# Patient Record
Sex: Male | Born: 1950 | Race: White | Hispanic: No | Smoking: Never smoker
Health system: Southern US, Community
[De-identification: ages and names within clinical notes are randomized; demographics above are authoritative.]

## PROBLEM LIST (undated history)

## (undated) DIAGNOSIS — N200 Calculus of kidney: Secondary | ICD-10-CM

## (undated) DIAGNOSIS — Z87442 Personal history of urinary calculi: Secondary | ICD-10-CM

## (undated) DIAGNOSIS — C229 Malignant neoplasm of liver, not specified as primary or secondary: Secondary | ICD-10-CM

## (undated) DIAGNOSIS — I5042 Chronic combined systolic (congestive) and diastolic (congestive) heart failure: Secondary | ICD-10-CM

## (undated) DIAGNOSIS — M1990 Unspecified osteoarthritis, unspecified site: Secondary | ICD-10-CM

## (undated) DIAGNOSIS — I219 Acute myocardial infarction, unspecified: Secondary | ICD-10-CM

## (undated) DIAGNOSIS — F250 Schizoaffective disorder, bipolar type: Secondary | ICD-10-CM

## (undated) DIAGNOSIS — C801 Malignant (primary) neoplasm, unspecified: Secondary | ICD-10-CM

## (undated) DIAGNOSIS — R011 Cardiac murmur, unspecified: Secondary | ICD-10-CM

## (undated) DIAGNOSIS — C719 Malignant neoplasm of brain, unspecified: Secondary | ICD-10-CM

## (undated) DIAGNOSIS — I2510 Atherosclerotic heart disease of native coronary artery without angina pectoris: Secondary | ICD-10-CM

## (undated) DIAGNOSIS — I10 Essential (primary) hypertension: Secondary | ICD-10-CM

## (undated) DIAGNOSIS — L03119 Cellulitis of unspecified part of limb: Secondary | ICD-10-CM

## (undated) DIAGNOSIS — E119 Type 2 diabetes mellitus without complications: Secondary | ICD-10-CM

## (undated) DIAGNOSIS — L02619 Cutaneous abscess of unspecified foot: Secondary | ICD-10-CM

## (undated) DIAGNOSIS — E7800 Pure hypercholesterolemia, unspecified: Secondary | ICD-10-CM

## (undated) DIAGNOSIS — K219 Gastro-esophageal reflux disease without esophagitis: Secondary | ICD-10-CM

## (undated) DIAGNOSIS — F319 Bipolar disorder, unspecified: Secondary | ICD-10-CM

## (undated) DIAGNOSIS — G4733 Obstructive sleep apnea (adult) (pediatric): Secondary | ICD-10-CM

## (undated) HISTORY — PX: HIP SURGERY: SHX245

## (undated) HISTORY — PX: APPENDECTOMY: SHX54

## (undated) HISTORY — PX: KNEE SURGERY: SHX244

---

## 1993-02-16 DIAGNOSIS — R011 Cardiac murmur, unspecified: Secondary | ICD-10-CM

## 1993-02-16 HISTORY — DX: Cardiac murmur, unspecified: R01.1

## 2001-04-29 ENCOUNTER — Encounter: Payer: Self-pay | Admitting: Emergency Medicine

## 2001-04-29 ENCOUNTER — Inpatient Hospital Stay (HOSPITAL_COMMUNITY): Admission: EM | Admit: 2001-04-29 | Discharge: 2001-05-06 | Payer: Self-pay | Admitting: Nephrology

## 2001-04-29 ENCOUNTER — Emergency Department (HOSPITAL_COMMUNITY): Admission: EM | Admit: 2001-04-29 | Discharge: 2001-04-29 | Payer: Self-pay | Admitting: Emergency Medicine

## 2001-04-29 ENCOUNTER — Encounter: Payer: Self-pay | Admitting: Nephrology

## 2001-05-01 ENCOUNTER — Encounter: Payer: Self-pay | Admitting: Urology

## 2001-05-02 ENCOUNTER — Encounter: Payer: Self-pay | Admitting: Urology

## 2001-05-03 ENCOUNTER — Encounter: Payer: Self-pay | Admitting: Nephrology

## 2001-05-03 ENCOUNTER — Encounter: Payer: Self-pay | Admitting: Urology

## 2001-05-04 ENCOUNTER — Encounter: Payer: Self-pay | Admitting: Nephrology

## 2001-05-12 ENCOUNTER — Encounter: Payer: Self-pay | Admitting: Urology

## 2001-05-12 ENCOUNTER — Ambulatory Visit (HOSPITAL_BASED_OUTPATIENT_CLINIC_OR_DEPARTMENT_OTHER): Admission: RE | Admit: 2001-05-12 | Discharge: 2001-05-12 | Payer: Self-pay | Admitting: Urology

## 2001-05-19 ENCOUNTER — Ambulatory Visit (HOSPITAL_BASED_OUTPATIENT_CLINIC_OR_DEPARTMENT_OTHER): Admission: RE | Admit: 2001-05-19 | Discharge: 2001-05-19 | Payer: Self-pay | Admitting: Urology

## 2001-05-19 ENCOUNTER — Encounter: Payer: Self-pay | Admitting: Urology

## 2001-07-14 ENCOUNTER — Ambulatory Visit (HOSPITAL_BASED_OUTPATIENT_CLINIC_OR_DEPARTMENT_OTHER): Admission: RE | Admit: 2001-07-14 | Discharge: 2001-07-14 | Payer: Self-pay | Admitting: Urology

## 2001-09-22 ENCOUNTER — Emergency Department (HOSPITAL_COMMUNITY): Admission: EM | Admit: 2001-09-22 | Discharge: 2001-09-22 | Payer: Self-pay | Admitting: Emergency Medicine

## 2001-09-23 ENCOUNTER — Encounter: Payer: Self-pay | Admitting: Nephrology

## 2001-09-23 ENCOUNTER — Ambulatory Visit (HOSPITAL_COMMUNITY): Admission: RE | Admit: 2001-09-23 | Discharge: 2001-09-23 | Payer: Self-pay | Admitting: Nephrology

## 2003-08-29 ENCOUNTER — Ambulatory Visit (HOSPITAL_COMMUNITY): Admission: RE | Admit: 2003-08-29 | Discharge: 2003-08-29 | Payer: Self-pay | Admitting: Ophthalmology

## 2010-08-19 ENCOUNTER — Other Ambulatory Visit (HOSPITAL_COMMUNITY): Payer: Self-pay | Admitting: Pulmonary Disease

## 2010-08-19 ENCOUNTER — Ambulatory Visit (HOSPITAL_COMMUNITY)
Admission: RE | Admit: 2010-08-19 | Discharge: 2010-08-19 | Disposition: A | Discharge status: Home / Self Care | Payer: Medicare Other | Source: Ambulatory Visit | Attending: Pulmonary Disease | Admitting: Pulmonary Disease

## 2010-08-19 DIAGNOSIS — I10 Essential (primary) hypertension: Secondary | ICD-10-CM | POA: Insufficient documentation

## 2010-08-19 DIAGNOSIS — E119 Type 2 diabetes mellitus without complications: Secondary | ICD-10-CM | POA: Insufficient documentation

## 2010-08-19 DIAGNOSIS — J40 Bronchitis, not specified as acute or chronic: Secondary | ICD-10-CM

## 2010-08-19 DIAGNOSIS — R059 Cough, unspecified: Secondary | ICD-10-CM | POA: Insufficient documentation

## 2010-08-19 DIAGNOSIS — R05 Cough: Secondary | ICD-10-CM | POA: Insufficient documentation

## 2010-11-05 ENCOUNTER — Ambulatory Visit (HOSPITAL_COMMUNITY)
Admission: RE | Admit: 2010-11-05 | Discharge: 2010-11-05 | Disposition: A | Discharge status: Home / Self Care | Payer: Medicare Other | Source: Ambulatory Visit | Attending: Urology | Admitting: Urology

## 2010-11-05 ENCOUNTER — Other Ambulatory Visit: Payer: Self-pay | Admitting: Urology

## 2010-11-05 DIAGNOSIS — N200 Calculus of kidney: Secondary | ICD-10-CM | POA: Insufficient documentation

## 2010-11-05 DIAGNOSIS — Q619 Cystic kidney disease, unspecified: Secondary | ICD-10-CM | POA: Insufficient documentation

## 2010-11-05 DIAGNOSIS — R109 Unspecified abdominal pain: Secondary | ICD-10-CM | POA: Insufficient documentation

## 2010-11-05 LAB — CREATININE, SERUM
Creatinine, Ser: 1.03 mg/dL (ref 0.50–1.35)
GFR calc Af Amer: >60 mL/min (ref >60)
GFR calc non Af Amer: >60 mL/min (ref >60)

## 2010-11-05 LAB — BUN: BUN: 18 mg/dL (ref 6–23)

## 2010-12-05 ENCOUNTER — Ambulatory Visit (INDEPENDENT_AMBULATORY_CARE_PROVIDER_SITE_OTHER): Payer: Medicare Other | Admitting: Urology

## 2010-12-05 DIAGNOSIS — N200 Calculus of kidney: Secondary | ICD-10-CM

## 2010-12-05 DIAGNOSIS — N281 Cyst of kidney, acquired: Secondary | ICD-10-CM

## 2010-12-05 DIAGNOSIS — N400 Enlarged prostate without lower urinary tract symptoms: Secondary | ICD-10-CM

## 2011-05-07 ENCOUNTER — Ambulatory Visit (INDEPENDENT_AMBULATORY_CARE_PROVIDER_SITE_OTHER): Payer: Medicare Other | Admitting: Otolaryngology

## 2011-05-07 DIAGNOSIS — H6120 Impacted cerumen, unspecified ear: Secondary | ICD-10-CM

## 2011-05-07 DIAGNOSIS — H903 Sensorineural hearing loss, bilateral: Secondary | ICD-10-CM

## 2011-11-05 ENCOUNTER — Ambulatory Visit (INDEPENDENT_AMBULATORY_CARE_PROVIDER_SITE_OTHER): Payer: Medicare Other | Admitting: Otolaryngology

## 2011-11-05 DIAGNOSIS — H903 Sensorineural hearing loss, bilateral: Secondary | ICD-10-CM

## 2011-11-27 ENCOUNTER — Ambulatory Visit (INDEPENDENT_AMBULATORY_CARE_PROVIDER_SITE_OTHER): Payer: Medicare Other | Admitting: Urology

## 2011-11-27 DIAGNOSIS — N400 Enlarged prostate without lower urinary tract symptoms: Secondary | ICD-10-CM

## 2011-11-27 DIAGNOSIS — N200 Calculus of kidney: Secondary | ICD-10-CM

## 2012-09-23 ENCOUNTER — Other Ambulatory Visit: Payer: Self-pay | Admitting: Urology

## 2012-09-23 DIAGNOSIS — N200 Calculus of kidney: Secondary | ICD-10-CM

## 2012-11-03 ENCOUNTER — Ambulatory Visit (INDEPENDENT_AMBULATORY_CARE_PROVIDER_SITE_OTHER): Payer: Medicare Other | Admitting: Otolaryngology

## 2012-12-02 ENCOUNTER — Ambulatory Visit (HOSPITAL_COMMUNITY)
Admission: RE | Admit: 2012-12-02 | Discharge: 2012-12-02 | Disposition: A | Discharge status: Home / Self Care | Payer: Medicare Other | Source: Ambulatory Visit | Attending: Urology | Admitting: Urology

## 2012-12-02 DIAGNOSIS — N200 Calculus of kidney: Secondary | ICD-10-CM | POA: Insufficient documentation

## 2012-12-02 DIAGNOSIS — I10 Essential (primary) hypertension: Secondary | ICD-10-CM | POA: Insufficient documentation

## 2012-12-02 DIAGNOSIS — N400 Enlarged prostate without lower urinary tract symptoms: Secondary | ICD-10-CM | POA: Insufficient documentation

## 2012-12-09 ENCOUNTER — Ambulatory Visit (INDEPENDENT_AMBULATORY_CARE_PROVIDER_SITE_OTHER): Payer: Medicare Other | Admitting: Urology

## 2012-12-09 ENCOUNTER — Encounter (INDEPENDENT_AMBULATORY_CARE_PROVIDER_SITE_OTHER): Payer: Self-pay

## 2012-12-09 DIAGNOSIS — N200 Calculus of kidney: Secondary | ICD-10-CM

## 2012-12-09 DIAGNOSIS — N401 Enlarged prostate with lower urinary tract symptoms: Secondary | ICD-10-CM

## 2012-12-27 ENCOUNTER — Other Ambulatory Visit (HOSPITAL_COMMUNITY): Payer: Self-pay | Admitting: Pulmonary Disease

## 2012-12-27 ENCOUNTER — Ambulatory Visit (HOSPITAL_COMMUNITY)
Admission: RE | Admit: 2012-12-27 | Discharge: 2012-12-27 | Disposition: A | Discharge status: Home / Self Care | Payer: Medicare Other | Source: Ambulatory Visit | Attending: Pulmonary Disease | Admitting: Pulmonary Disease

## 2012-12-27 DIAGNOSIS — M5137 Other intervertebral disc degeneration, lumbosacral region: Secondary | ICD-10-CM | POA: Insufficient documentation

## 2012-12-27 DIAGNOSIS — M549 Dorsalgia, unspecified: Secondary | ICD-10-CM

## 2012-12-27 DIAGNOSIS — M51379 Other intervertebral disc degeneration, lumbosacral region without mention of lumbar back pain or lower extremity pain: Secondary | ICD-10-CM | POA: Insufficient documentation

## 2012-12-27 DIAGNOSIS — M47817 Spondylosis without myelopathy or radiculopathy, lumbosacral region: Secondary | ICD-10-CM | POA: Insufficient documentation

## 2012-12-27 DIAGNOSIS — Q762 Congenital spondylolisthesis: Secondary | ICD-10-CM | POA: Insufficient documentation

## 2012-12-27 DIAGNOSIS — M545 Low back pain, unspecified: Secondary | ICD-10-CM | POA: Insufficient documentation

## 2013-09-27 ENCOUNTER — Emergency Department (HOSPITAL_COMMUNITY)
Admission: EM | Admit: 2013-09-27 | Discharge: 2013-09-27 | Disposition: A | Discharge status: Home / Self Care | Payer: Medicare Other | Attending: Emergency Medicine | Admitting: Emergency Medicine

## 2013-09-27 ENCOUNTER — Encounter (HOSPITAL_COMMUNITY): Payer: Self-pay | Admitting: Emergency Medicine

## 2013-09-27 DIAGNOSIS — R1011 Right upper quadrant pain: Secondary | ICD-10-CM | POA: Insufficient documentation

## 2013-09-27 DIAGNOSIS — Z8739 Personal history of other diseases of the musculoskeletal system and connective tissue: Secondary | ICD-10-CM | POA: Insufficient documentation

## 2013-09-27 DIAGNOSIS — Z87442 Personal history of urinary calculi: Secondary | ICD-10-CM | POA: Insufficient documentation

## 2013-09-27 DIAGNOSIS — Z8639 Personal history of other endocrine, nutritional and metabolic disease: Secondary | ICD-10-CM | POA: Insufficient documentation

## 2013-09-27 DIAGNOSIS — Z79899 Other long term (current) drug therapy: Secondary | ICD-10-CM | POA: Diagnosis not present

## 2013-09-27 DIAGNOSIS — R110 Nausea: Secondary | ICD-10-CM | POA: Diagnosis not present

## 2013-09-27 DIAGNOSIS — I10 Essential (primary) hypertension: Secondary | ICD-10-CM | POA: Diagnosis not present

## 2013-09-27 DIAGNOSIS — R011 Cardiac murmur, unspecified: Secondary | ICD-10-CM | POA: Diagnosis not present

## 2013-09-27 DIAGNOSIS — R109 Unspecified abdominal pain: Secondary | ICD-10-CM | POA: Insufficient documentation

## 2013-09-27 DIAGNOSIS — Z9089 Acquired absence of other organs: Secondary | ICD-10-CM | POA: Insufficient documentation

## 2013-09-27 DIAGNOSIS — Z862 Personal history of diseases of the blood and blood-forming organs and certain disorders involving the immune mechanism: Secondary | ICD-10-CM | POA: Diagnosis not present

## 2013-09-27 HISTORY — DX: Essential (primary) hypertension: I10

## 2013-09-27 HISTORY — DX: Type 2 diabetes mellitus without complications: E11.9

## 2013-09-27 HISTORY — DX: Calculus of kidney: N20.0

## 2013-09-27 HISTORY — DX: Unspecified osteoarthritis, unspecified site: M19.90

## 2013-09-27 LAB — CBC WITH DIFFERENTIAL/PLATELET
Basophils Absolute: 0 10*3/uL (ref 0.0–0.1)
Basophils Relative: 0 % (ref 0–1)
EOS ABS: 0 10*3/uL (ref 0.0–0.7)
Eosinophils Relative: 0 % (ref 0–5)
HCT: 48.4 % (ref 39.0–52.0)
HEMOGLOBIN: 17.4 g/dL — AB (ref 13.0–17.0)
Lymphocytes Relative: 9 % — ABNORMAL LOW (ref 12–46)
Lymphs Abs: 1.3 10*3/uL (ref 0.7–4.0)
MCH: 30.3 pg (ref 26.0–34.0)
MCHC: 36 g/dL (ref 30.0–36.0)
MCV: 84.3 fL (ref 78.0–100.0)
Monocytes Absolute: 0.7 10*3/uL (ref 0.1–1.0)
Monocytes Relative: 5 % (ref 3–12)
NEUTROS PCT: 86 % — AB (ref 43–77)
Neutro Abs: 11.9 10*3/uL — ABNORMAL HIGH (ref 1.7–7.7)
Platelets: 124 10*3/uL — ABNORMAL LOW (ref 150–400)
RBC: 5.74 MIL/uL (ref 4.22–5.81)
RDW: 14 % (ref 11.5–15.5)
WBC: 14 10*3/uL — ABNORMAL HIGH (ref 4.0–10.5)

## 2013-09-27 LAB — COMPREHENSIVE METABOLIC PANEL
ALT: 19 U/L (ref 0–53)
ANION GAP: 11 (ref 5–15)
AST: 18 U/L (ref 0–37)
Albumin: 3.7 g/dL (ref 3.5–5.2)
Alkaline Phosphatase: 126 U/L — ABNORMAL HIGH (ref 39–117)
BILIRUBIN TOTAL: 0.4 mg/dL (ref 0.3–1.2)
BUN: 20 mg/dL (ref 6–23)
CO2: 29 mEq/L (ref 19–32)
Calcium: 9.5 mg/dL (ref 8.4–10.5)
Chloride: 97 mEq/L (ref 96–112)
Creatinine, Ser: 1.34 mg/dL (ref 0.50–1.35)
GFR calc non Af Amer: 55 mL/min — ABNORMAL LOW (ref >90)
GFR, EST AFRICAN AMERICAN: 64 mL/min — AB (ref >90)
GLUCOSE: 229 mg/dL — AB (ref 70–99)
POTASSIUM: 3.9 meq/L (ref 3.7–5.3)
Sodium: 137 mEq/L (ref 137–147)
TOTAL PROTEIN: 7.1 g/dL (ref 6.0–8.3)

## 2013-09-27 LAB — URINALYSIS, ROUTINE W REFLEX MICROSCOPIC
BILIRUBIN URINE: NEGATIVE
GLUCOSE, UA: 500 mg/dL — AB
HGB URINE DIPSTICK: NEGATIVE
Ketones, ur: NEGATIVE mg/dL
Leukocytes, UA: NEGATIVE
NITRITE: NEGATIVE
Protein, ur: NEGATIVE mg/dL
SPECIFIC GRAVITY, URINE: 1.025 (ref 1.005–1.030)
Urobilinogen, UA: 0.2 mg/dL (ref 0.0–1.0)
pH: 5.5 (ref 5.0–8.0)

## 2013-09-27 LAB — LIPASE, BLOOD: Lipase: 21 U/L (ref 11–59)

## 2013-09-27 MED ORDER — SODIUM CHLORIDE 0.9 % IV SOLN: 1000.0000 mL | INTRAVENOUS | Status: DC

## 2013-09-27 MED ORDER — HYDROCODONE-ACETAMINOPHEN 5-325 MG PO TABS: 1.0000 | ORAL_TABLET | Freq: Four times a day (QID) | ORAL | Status: DC | PRN

## 2013-09-27 MED ORDER — FENTANYL CITRATE 0.05 MG/ML IJ SOLN
50.0000 ug | Freq: Once | INTRAMUSCULAR | Status: AC
  Administered 2013-09-27: 50 ug via INTRAVENOUS
  Filled 2013-09-27: qty 2

## 2013-09-27 MED ORDER — SODIUM CHLORIDE 0.9 % IV SOLN
1000.0000 mL | Freq: Once | INTRAVENOUS | Status: AC
  Administered 2013-09-27: 1000 mL via INTRAVENOUS

## 2013-09-27 MED ORDER — FENTANYL CITRATE 0.05 MG/ML IJ SOLN
25.0000 ug | Freq: Once | INTRAMUSCULAR | Status: AC
  Administered 2013-09-27: 25 ug via INTRAVENOUS
  Filled 2013-09-27: qty 2

## 2013-09-27 MED ORDER — ONDANSETRON HCL 4 MG/2ML IJ SOLN
4.0000 mg | Freq: Once | INTRAMUSCULAR | Status: AC
Start: 2013-09-27 — End: 2013-09-27
  Administered 2013-09-27: 4 mg via INTRAVENOUS
  Filled 2013-09-27: qty 2

## 2013-09-27 MED ORDER — ONDANSETRON HCL 4 MG PO TABS: 4.0000 mg | ORAL_TABLET | Freq: Three times a day (TID) | ORAL | Status: DC | PRN

## 2013-09-27 NOTE — ED Provider Notes (Signed)
CSN: [id number]     Arrival date & time 09/27/13  2038 History   This chart was scribed for Janice Norrie, MD by Lowella Petties, ED Scribe. The patient was seen in room APA16A/APA16A. Patient's care was started at 9:46 PM.    Chief Complaint  Patient presents with  . Abdominal Pain    The history is provided by the patient. No language interpreter was used.   HPI Comments: [name] is a 63 y.o. male who presents to the Emergency Department complaining of a constant, severe,non-radiating abdominal pain on the upper right side, onset earlier this afternoon. He reports a loose BM earlier tonight, and subjective fever. He reports eating a lot of fried ocra before the pain began.He denies cough, sore throat, rhinorrhea, and vomiting. He has had nausea. The pain does not radiate to his back He reports that there are no modifying factors that make the pain worse. He reports that curling up improves the pain. He states that he has had stomach aches in the past, but never this severe.  He denies being around someone sick. He reports a past history of appendectomy. He states that he is on disability for Bipolar Disorder. He reports that he does not smoke or drink.  PCP: Alonza Bogus, MD   Past Medical History  Diagnosis Date  . Diabetes mellitus without complication     boderline  . Hypertension   . Arthritis   . Kidney stones    Past Surgical History  Procedure Laterality Date  . Knee surgery    . Appendectomy    . Hip surgery     No family history on file. History  Substance Use Topics  . Smoking status: Never Smoker   . Smokeless tobacco: Not on file  . Alcohol Use: No  lives at home Lives with mother  Review of Systems A complete 10 system review of systems was obtained and all systems are negative except as noted in the HPI and PMH.   Allergies  Codeine and Morphine and related  Home Medications   Prior to Admission medications   Medication Sig Start Date End Date  Taking? Authorizing Provider  amLODipine (NORVASC) 5 MG tablet Take 5 mg by mouth daily. 08/30/13  Yes Historical Provider, MD  ARIPiprazole (ABILIFY) 15 MG tablet Take 7.5 mg by mouth daily. 09/13/13  Yes Historical Provider, MD  Calcium Carbonate-Vitamin D (CALTRATE 600+D PO) Take 1 tablet by mouth 2 (two) times daily.   Yes Historical Provider, MD  hydrochlorothiazide (HYDRODIURIL) 25 MG tablet Take 25 mg by mouth daily. 07/19/13  Yes Historical Provider, MD  lisinopril (PRINIVIL,ZESTRIL) 10 MG tablet Take 10 mg by mouth daily. 08/30/13  Yes Historical Provider, MD  omeprazole (PRILOSEC) 20 MG capsule Take 20 mg by mouth daily. 09/13/13  Yes Historical Provider, MD  potassium chloride SA (K-DUR,KLOR-CON) 20 MEQ tablet Take 20 mEq by mouth daily. 09/13/13  Yes Historical Provider, MD  vitamin C (ASCORBIC ACID) 500 MG tablet Take 500 mg by mouth daily.   Yes Historical Provider, MD   Triage Vitals: BP 120/73  Pulse 89  Temp(Src) 98.1 F (36.7 C) (Oral)  Resp 20  Ht 5\' 4"  (1.626 m)  Wt 185 lb (83.915 kg)  BMI 31.74 kg/m2  SpO2 99%  Vital signs normal   Physical Exam  Nursing note and vitals reviewed. Constitutional: He is oriented to person, place, and time. He appears well-developed and well-nourished.  Non-toxic appearance. He does not appear ill.  No distress.  Appears uncomfortable  HENT:  Head: Normocephalic and atraumatic.  Right Ear: External ear normal.  Left Ear: External ear normal.  Nose: Nose normal. No mucosal edema or rhinorrhea.  Mouth/Throat: Oropharynx is clear and moist and mucous membranes are normal. No dental abscesses or uvula swelling.  Eyes: Conjunctivae and EOM are normal. Pupils are equal, round, and reactive to light.  Neck: Normal range of motion and full passive range of motion without pain. Neck supple.  Cardiovascular: Normal rate and regular rhythm.  Exam reveals no gallop and no friction rub.   Murmur heard.  Crescendo systolic murmur is present  Murmur  heard best left upper sternal boarder.   Pulmonary/Chest: Effort normal and breath sounds normal. No respiratory distress. He has no wheezes. He has no rhonchi. He has no rales. He exhibits no tenderness and no crepitus.  Abdominal: Soft. Normal appearance and bowel sounds are normal. He exhibits no distension. There is tenderness (RUQ) in the right upper quadrant. There is no rebound and no guarding.    No CVA tenderness.  Musculoskeletal: Normal range of motion. He exhibits no edema and no tenderness.  Moves all extremities well.   Neurological: He is alert and oriented to person, place, and time. He has normal strength. No cranial nerve deficit.  Skin: Skin is warm, dry and intact. No rash noted. No erythema. No pallor.  Psychiatric: His speech is normal and behavior is normal. His mood appears not anxious.  Flat affect    ED Course  Procedures (including critical care time) Medications  0.9 %  sodium chloride infusion (0 mLs Intravenous Stopped 09/27/13 2328)    Followed by  0.9 %  sodium chloride infusion (not administered)  fentaNYL (SUBLIMAZE) injection 50 mcg (50 mcg Intravenous Given 09/27/13 2224)  ondansetron (ZOFRAN) injection 4 mg (4 mg Intravenous Given 09/27/13 2224)  fentaNYL (SUBLIMAZE) injection 25 mcg (25 mcg Intravenous Given 09/27/13 2325)    DIAGNOSTIC STUDIES: Oxygen Saturation is 99% on room air, normal by my interpretation.    COORDINATION OF CARE: 9:54 PM-Discussed treatment plan which includes pain medication, nausea medication, and blood work with pt at bedside and pt agreed to plan.   Pt and mother given test results, his pain is almost gone. Will have patient return to get outpatient [place] of gallbladder tomorrow.   Labs Review Results for orders placed during the hospital encounter of 09/27/13  URINALYSIS, ROUTINE W REFLEX MICROSCOPIC      Result Value Ref Range   Color, Urine YELLOW  YELLOW   APPearance CLEAR  CLEAR   Specific Gravity, Urine 1.025   1.005 - 1.030   pH 5.5  5.0 - 8.0   Glucose, UA 500 (*) NEGATIVE mg/dL   Hgb urine dipstick NEGATIVE  NEGATIVE   Bilirubin Urine NEGATIVE  NEGATIVE   Ketones, ur NEGATIVE  NEGATIVE mg/dL   Protein, ur NEGATIVE  NEGATIVE mg/dL   Urobilinogen, UA 0.2  0.0 - 1.0 mg/dL   Nitrite NEGATIVE  NEGATIVE   Leukocytes, UA NEGATIVE  NEGATIVE  CBC WITH DIFFERENTIAL      Result Value Ref Range   WBC 14.0 (*) 4.0 - 10.5 K/uL   RBC 5.74  4.22 - 5.81 MIL/uL   Hemoglobin 17.4 (*) 13.0 - 17.0 g/dL   HCT 48.4  39.0 - 52.0 %   MCV 84.3  78.0 - 100.0 fL   MCH 30.3  26.0 - 34.0 pg   MCHC 36.0  30.0 - 36.0 g/dL  RDW 14.0  11.5 - 15.5 %   Platelets 124 (*) 150 - 400 K/uL   Neutrophils Relative % 86 (*) 43 - 77 %   Neutro Abs 11.9 (*) 1.7 - 7.7 K/uL   Lymphocytes Relative 9 (*) 12 - 46 %   Lymphs Abs 1.3  0.7 - 4.0 K/uL   Monocytes Relative 5  3 - 12 %   Monocytes Absolute 0.7  0.1 - 1.0 K/uL   Eosinophils Relative 0  0 - 5 %   Eosinophils Absolute 0.0  0.0 - 0.7 K/uL   Basophils Relative 0  0 - 1 %   Basophils Absolute 0.0  0.0 - 0.1 K/uL  COMPREHENSIVE METABOLIC PANEL      Result Value Ref Range   Sodium 137  137 - 147 mEq/L   Potassium 3.9  3.7 - 5.3 mEq/L   Chloride 97  96 - 112 mEq/L   CO2 29  19 - 32 mEq/L   Glucose, Bld 229 (*) 70 - 99 mg/dL   BUN 20  6 - 23 mg/dL   Creatinine, Ser 1.34  0.50 - 1.35 mg/dL   Calcium 9.5  8.4 - 10.5 mg/dL   Total Protein 7.1  6.0 - 8.3 g/dL   Albumin 3.7  3.5 - 5.2 g/dL   AST 18  0 - 37 U/L   ALT 19  0 - 53 U/L   Alkaline Phosphatase 126 (*) 39 - 117 U/L   Total Bilirubin 0.4  0.3 - 1.2 mg/dL   GFR calc non Af Amer 55 (*) >90 mL/min   GFR calc Af Amer 64 (*) >90 mL/min   Anion gap 11  5 - 15  LIPASE, BLOOD      Result Value Ref Range   Lipase 21  11 - 59 U/L   Laboratory interpretation all normal      Imaging Review No results found.   EKG Interpretation None      MDM   Final diagnoses:  RUQ abdominal pain    Discharge Medication  List as of 09/27/2013 11:31 PM    START taking these medications   Details  HYDROcodone-acetaminophen (NORCO/VICODIN) 5-325 MG per tablet Take 1 tablet by mouth every 6 (six) hours as needed for moderate pain., Starting 09/27/2013, Until Discontinued, Print    ondansetron (ZOFRAN) 4 MG tablet Take 1 tablet (4 mg total) by mouth every 8 (eight) hours as needed for nausea or vomiting., Starting 09/27/2013, Until Discontinued, Print        Plan discharge  Rolland Porter, MD, FACEP   I personally performed the services described in this documentation, which was scribed in my presence. The recorded information has been reviewed and considered.  Rolland Porter, MD, FACEP   Janice Norrie, MD 09/28/13 [phone]

## 2013-09-27 NOTE — Discharge Instructions (Signed)
Avoid fried, spicy or greasy foods. The first available appointment tomorrow for the ultrasound to check you for gallstones is at 1 pm tomorrow, Thursday, the 13th. Arrive 15 mintues early. DO NOT EAT OR DRINK FOR 8 HOURS BEFORE YOUR TEST WHICH WOULD BE AFTER 7 AM). You can take your medications with a small amount of water. Take the hydrocodone if needed for pain and use the zofran for nausea or vomiting.   If you have gallstones, continue to avoid fried, spicy or greasy foods. Call Dr Arnoldo Morale office to discuss your gallstones. If you don't have gallstones take the prilosec OTC twice a day for 2 weeks then once a day and call Dr Gala Romney to have him recheck you to further evaluate your abdominal pain.

## 2013-09-27 NOTE — ED Notes (Signed)
Having severe stomach pain, tried to vomit but I would not. I had a bowel movement and it was a little loose per pt.

## 2013-09-28 ENCOUNTER — Other Ambulatory Visit (HOSPITAL_COMMUNITY): Payer: Self-pay | Admitting: Emergency Medicine

## 2013-09-28 ENCOUNTER — Ambulatory Visit (HOSPITAL_COMMUNITY)
Admit: 2013-09-28 | Discharge: 2013-09-28 | Disposition: A | Discharge status: Home / Self Care | Payer: Medicare Other | Source: Ambulatory Visit | Attending: Emergency Medicine | Admitting: Emergency Medicine

## 2013-09-28 DIAGNOSIS — K8020 Calculus of gallbladder without cholecystitis without obstruction: Secondary | ICD-10-CM | POA: Diagnosis not present

## 2013-09-28 DIAGNOSIS — R1011 Right upper quadrant pain: Secondary | ICD-10-CM

## 2013-09-28 MED ORDER — HYDROCODONE-ACETAMINOPHEN 5-325 MG PO TABS: 1.0000 | ORAL_TABLET | ORAL | Status: DC | PRN

## 2013-09-28 NOTE — ED Provider Notes (Signed)
[name] returned today for outpatient ultrasound after being evaluated yesterday for right upper quadrant abdominal pain. [name]'s blood work from yesterday was reviewed, he had essentially normal LFTs except for very slightly elevated alkaline phosphatase. The [name] is afebrile. He reports that his pain is still present, but much better than it was yesterday.  Ultrasound report was reviewed. She does have gallbladder wall edema with cholelithiasis. Case discussed with Doctor Aviva Signs, on call for general surgery. He does not recommend urgent or emergent surgery. [name] to be followed as an outpatient. He will see the [name] in the office on Tuesday. [name] will continue analgesia, bland diet. Return to the ER if he has any increased pain, vomiting, fever.  Results for orders placed during the hospital encounter of 09/27/13  URINALYSIS, ROUTINE W REFLEX MICROSCOPIC      Result Value Ref Range   Color, Urine YELLOW  YELLOW   APPearance CLEAR  CLEAR   Specific Gravity, Urine 1.025  1.005 - 1.030   pH 5.5  5.0 - 8.0   Glucose, UA 500 (*) NEGATIVE mg/dL   Hgb urine dipstick NEGATIVE  NEGATIVE   Bilirubin Urine NEGATIVE  NEGATIVE   Ketones, ur NEGATIVE  NEGATIVE mg/dL   Protein, ur NEGATIVE  NEGATIVE mg/dL   Urobilinogen, UA 0.2  0.0 - 1.0 mg/dL   Nitrite NEGATIVE  NEGATIVE   Leukocytes, UA NEGATIVE  NEGATIVE  CBC WITH DIFFERENTIAL      Result Value Ref Range   WBC 14.0 (*) 4.0 - 10.5 K/uL   RBC 5.74  4.22 - 5.81 MIL/uL   Hemoglobin 17.4 (*) 13.0 - 17.0 g/dL   HCT 48.4  39.0 - 52.0 %   MCV 84.3  78.0 - 100.0 fL   MCH 30.3  26.0 - 34.0 pg   MCHC 36.0  30.0 - 36.0 g/dL   RDW 14.0  11.5 - 15.5 %   Platelets 124 (*) 150 - 400 K/uL   Neutrophils Relative % 86 (*) 43 - 77 %   Neutro Abs 11.9 (*) 1.7 - 7.7 K/uL   Lymphocytes Relative 9 (*) 12 - 46 %   Lymphs Abs 1.3  0.7 - 4.0 K/uL   Monocytes Relative 5  3 - 12 %   Monocytes Absolute 0.7  0.1 - 1.0 K/uL   Eosinophils Relative 0  0  - 5 %   Eosinophils Absolute 0.0  0.0 - 0.7 K/uL   Basophils Relative 0  0 - 1 %   Basophils Absolute 0.0  0.0 - 0.1 K/uL  COMPREHENSIVE METABOLIC PANEL      Result Value Ref Range   Sodium 137  137 - 147 mEq/L   Potassium 3.9  3.7 - 5.3 mEq/L   Chloride 97  96 - 112 mEq/L   CO2 29  19 - 32 mEq/L   Glucose, Bld 229 (*) 70 - 99 mg/dL   BUN 20  6 - 23 mg/dL   Creatinine, Ser 1.34  0.50 - 1.35 mg/dL   Calcium 9.5  8.4 - 10.5 mg/dL   Total Protein 7.1  6.0 - 8.3 g/dL   Albumin 3.7  3.5 - 5.2 g/dL   AST 18  0 - 37 U/L   ALT 19  0 - 53 U/L   Alkaline Phosphatase 126 (*) 39 - 117 U/L   Total Bilirubin 0.4  0.3 - 1.2 mg/dL   GFR calc non Af Amer 55 (*) >90 mL/min   GFR calc Af Amer 64 (*) >90  mL/min   Anion gap 11  5 - 15  LIPASE, BLOOD      Result Value Ref Range   Lipase 21  11 - 59 U/L   US Abdomen Limited Ruq  09/28/2013   CLINICAL DATA:  Right upper quadrant pain  EXAM: US ABDOMEN LIMITED - RIGHT UPPER QUADRANT  COMPARISON:  None.  FINDINGS: Gallbladder:  There are cholelithiasis. There has gallbladder wall thickening measuring 9.7 mm with severe edema. No significant pericholecystic fluid. The gallbladder is distended measuring up to 15 cm.  Common bile duct:  Diameter: 4.9 mm  Liver:  No focal lesion identified. Within normal limits in parenchymal echogenicity.  IMPRESSION: 1. Cholelithiasis with severe gallbladder wall thickening and gallbladder distention, most concerning for acute cholecystitis.   Electronically Signed   By: Kathreen Devoid   On: 09/28/2013 12:58      Orpah Greek, MD 09/28/13 [phone]

## 2013-10-05 ENCOUNTER — Ambulatory Visit (HOSPITAL_COMMUNITY)
Admission: RE | Admit: 2013-10-05 | Discharge: 2013-10-05 | Disposition: A | Discharge status: Home / Self Care | Payer: Medicare Other | Source: Ambulatory Visit | Attending: Pulmonary Disease | Admitting: Pulmonary Disease

## 2013-10-05 DIAGNOSIS — I359 Nonrheumatic aortic valve disorder, unspecified: Secondary | ICD-10-CM | POA: Diagnosis not present

## 2013-10-05 NOTE — Progress Notes (Signed)
  Echocardiogram 2D Echocardiogram has been performed.  [name], [name] 10/05/2013, 2:43 PM

## 2013-10-18 ENCOUNTER — Encounter (HOSPITAL_COMMUNITY): Payer: Self-pay | Admitting: Pharmacy Technician

## 2013-10-18 ENCOUNTER — Other Ambulatory Visit (HOSPITAL_COMMUNITY): Payer: Medicare Other

## 2013-10-18 NOTE — H&P (Signed)
[name] is an 63 y.o. male.   Chief Complaint: *abdominal pain, nausea** HPI: *62yo wm who has had persistent right upper quadrant abdominal pain and nausea.  U/S of gallbladder reveals cholelithiasis.**  Past Medical History  Diagnosis Date  . Diabetes mellitus without complication     boderline  . Hypertension   . Arthritis   . Kidney stones     Past Surgical History  Procedure Laterality Date  . Knee surgery    . Appendectomy    . Hip surgery      No family history on file. Social History:  reports that he has never smoked. He does not have any smokeless tobacco history on file. He reports that he does not drink alcohol or use illicit drugs.  Allergies:  Allergies  Allergen Reactions  . Codeine Nausea And Vomiting  . Morphine And Related Nausea And Vomiting    No prescriptions prior to admission    No results found for this or any previous visit (from the past 48 hour(s)). No results found.  Review of Systems  Constitutional: Positive for malaise/fatigue.  HENT: Negative.   Respiratory: Negative.   Cardiovascular: Negative.   Gastrointestinal: Positive for nausea and abdominal pain.  Genitourinary: Negative.   Skin: Negative.     There were no vitals taken for this visit. Physical Exam  Constitutional: He is oriented to person, place, and time. He appears well-developed and well-nourished.  HENT:  Head: Normocephalic and atraumatic.  Eyes: No scleral icterus.  Neck: Normal range of motion. Neck supple.  Cardiovascular: Normal rate and normal heart sounds.   Occassional skip beats noted.  Respiratory: Effort normal and breath sounds normal.  GI: Soft. He exhibits no distension. There is no rebound.  Pain in right upper quadrant to palpation.  Neurological: He is alert and oriented to person, place, and time.  Skin: Skin is warm and dry.     Assessment/Plan *Imp:  Biliary colic, cholelithiasis Plan:  Patient has been cleared by Dr. Luan Pulling for  surgery.  Scheduled for laparoscopic cholecystectomy on 10/20/13.  Risks and benefits of procedure including bleeding, infection, hepatobiliary injury, and the possibility of an open procedure were fully explained to the patient, who gives informed consent.**  [name] A 10/18/2013, 8:45 PM

## 2013-10-19 ENCOUNTER — Encounter (HOSPITAL_COMMUNITY): Payer: Self-pay

## 2013-10-19 ENCOUNTER — Encounter (HOSPITAL_COMMUNITY)
Admission: RE | Admit: 2013-10-19 | Discharge: 2013-10-19 | Disposition: A | Discharge status: Home / Self Care | Payer: Medicare Other | Source: Ambulatory Visit | Attending: General Surgery | Admitting: General Surgery

## 2013-10-19 DIAGNOSIS — F319 Bipolar disorder, unspecified: Secondary | ICD-10-CM | POA: Diagnosis not present

## 2013-10-19 DIAGNOSIS — I10 Essential (primary) hypertension: Secondary | ICD-10-CM | POA: Diagnosis not present

## 2013-10-19 DIAGNOSIS — K219 Gastro-esophageal reflux disease without esophagitis: Secondary | ICD-10-CM | POA: Diagnosis not present

## 2013-10-19 DIAGNOSIS — E119 Type 2 diabetes mellitus without complications: Secondary | ICD-10-CM | POA: Diagnosis not present

## 2013-10-19 DIAGNOSIS — K8020 Calculus of gallbladder without cholecystitis without obstruction: Secondary | ICD-10-CM | POA: Diagnosis present

## 2013-10-19 DIAGNOSIS — Z885 Allergy status to narcotic agent status: Secondary | ICD-10-CM | POA: Diagnosis not present

## 2013-10-19 DIAGNOSIS — M1990 Unspecified osteoarthritis, unspecified site: Secondary | ICD-10-CM | POA: Diagnosis not present

## 2013-10-19 DIAGNOSIS — K8010 Calculus of gallbladder with chronic cholecystitis without obstruction: Secondary | ICD-10-CM | POA: Diagnosis not present

## 2013-10-19 HISTORY — DX: Cardiac murmur, unspecified: R01.1

## 2013-10-19 HISTORY — DX: Bipolar disorder, unspecified: F31.9

## 2013-10-19 LAB — CBC WITH DIFFERENTIAL/PLATELET
BASOS ABS: 0 10*3/uL (ref 0.0–0.1)
Basophils Relative: 0 % (ref 0–1)
Eosinophils Absolute: 0.1 10*3/uL (ref 0.0–0.7)
Eosinophils Relative: 1 % (ref 0–5)
HCT: 45.6 % (ref 39.0–52.0)
Hemoglobin: 16.4 g/dL (ref 13.0–17.0)
Lymphocytes Relative: 22 % (ref 12–46)
Lymphs Abs: 2 10*3/uL (ref 0.7–4.0)
MCH: 30.5 pg (ref 26.0–34.0)
MCHC: 36 g/dL (ref 30.0–36.0)
MCV: 84.9 fL (ref 78.0–100.0)
MONO ABS: 0.6 10*3/uL (ref 0.1–1.0)
MONOS PCT: 7 % (ref 3–12)
NEUTROS PCT: 70 % (ref 43–77)
Neutro Abs: 6.2 10*3/uL (ref 1.7–7.7)
PLATELETS: 159 10*3/uL (ref 150–400)
RBC: 5.37 MIL/uL (ref 4.22–5.81)
RDW: 13.5 % (ref 11.5–15.5)
WBC: 8.9 10*3/uL (ref 4.0–10.5)

## 2013-10-19 LAB — BASIC METABOLIC PANEL
Anion gap: 14 (ref 5–15)
BUN: 14 mg/dL (ref 6–23)
CO2: 26 mEq/L (ref 19–32)
Calcium: 9.4 mg/dL (ref 8.4–10.5)
Chloride: 98 mEq/L (ref 96–112)
Creatinine, Ser: 1.35 mg/dL (ref 0.50–1.35)
GFR, EST AFRICAN AMERICAN: 63 mL/min — AB (ref >90)
GFR, EST NON AFRICAN AMERICAN: 54 mL/min — AB (ref >90)
Glucose, Bld: 103 mg/dL — ABNORMAL HIGH (ref 70–99)
POTASSIUM: 4.1 meq/L (ref 3.7–5.3)
SODIUM: 138 meq/L (ref 137–147)

## 2013-10-19 LAB — HEPATIC FUNCTION PANEL
ALT: 25 U/L (ref 0–53)
AST: 21 U/L (ref 0–37)
Albumin: 3.8 g/dL (ref 3.5–5.2)
Alkaline Phosphatase: 119 U/L — ABNORMAL HIGH (ref 39–117)
BILIRUBIN DIRECT: 0.4 mg/dL — AB (ref 0.0–0.3)
BILIRUBIN INDIRECT: 0.4 mg/dL (ref 0.3–0.9)
BILIRUBIN TOTAL: 0.8 mg/dL (ref 0.3–1.2)
Total Protein: 6.8 g/dL (ref 6.0–8.3)

## 2013-10-19 NOTE — Patient Instructions (Signed)
[name]  10/19/2013   Your procedure is scheduled on:  10/20/2013  Report to [facility] at  800  AM.  Call this number if you have problems the morning of surgery: [phone]   Remember:   Do not eat food or drink liquids after midnight.   Take these medicines the morning of surgery with A SIP OF WATER: abilify, hydrocodone, amlodipine, lisinopril, prilosec   Do not wear jewelry, make-up or nail polish.  Do not wear lotions, powders, or perfumes.   Do not shave 48 hours prior to surgery. Men may shave face and neck.  Do not bring valuables to the hospital.  [facility] is not responsible for any belongings or valuables.               Contacts, dentures or bridgework may not be worn into surgery.  Leave suitcase in the car. After surgery it may be brought to your room.  For patients admitted to the hospital, discharge time is determined by your treatment team.               Patients discharged the day of surgery will not be allowed to drive home.  Name and phone number of your driver: family  Special Instructions: Shower using CHG 2 nights before surgery and the night before surgery.  If you shower the day of surgery use CHG.  Use special wash - you have one bottle of CHG for all showers.  You should use approximately 1/3 of the bottle for each shower.   Please read over the following fact sheets that you were given: Pain Booklet, Coughing and Deep Breathing, Surgical Site Infection Prevention, Anesthesia Post-op Instructions and Care and Recovery After Surgery Laparoscopic Cholecystectomy Laparoscopic cholecystectomy is surgery to remove the gallbladder. The gallbladder is located in the upper right part of the abdomen, behind the liver. It is a storage sac for bile produced in the liver. Bile aids in the digestion and absorption of fats. Cholecystectomy is often done for inflammation of the gallbladder (cholecystitis). This condition is usually caused by a buildup of  gallstones (cholelithiasis) in your gallbladder. Gallstones can block the flow of bile, resulting in inflammation and pain. In severe cases, emergency surgery may be required. When emergency surgery is not required, you will have time to prepare for the procedure. Laparoscopic surgery is an alternative to open surgery. Laparoscopic surgery has a shorter recovery time. Your common bile duct may also need to be examined during the procedure. If stones are found in the common bile duct, they may be removed. LET [facility] CARE PROVIDER KNOW ABOUT:  Any allergies you have.  All medicines you are taking, including vitamins, herbs, eye drops, creams, and over-the-counter medicines.  Previous problems you or members of your family have had with the use of anesthetics.  Any blood disorders you have.  Previous surgeries you have had.  Medical conditions you have. RISKS AND COMPLICATIONS Generally, this is a safe procedure. However, as with any procedure, complications can occur. Possible complications include:  Infection.  Damage to the common bile duct, nerves, arteries, veins, or other internal organs such as the stomach, liver, or intestines.  Bleeding.  A stone may remain in the common bile duct.  A bile leak from the cyst duct that is clipped when your gallbladder is removed.  The need to convert to open surgery, which requires a larger incision in the abdomen. This may be necessary if your surgeon  thinks it is not safe to continue with a laparoscopic procedure. BEFORE THE PROCEDURE  Ask your health care provider about changing or stopping any regular medicines. You will need to stop taking aspirin or blood thinners at least 5 days prior to surgery.  Do not eat or drink anything after midnight the night before surgery.  Let your health care provider know if you develop a cold or other infectious problem before surgery. PROCEDURE   You will be given medicine to make you sleep  through the procedure (general anesthetic). A breathing tube will be placed in your mouth.  When you are asleep, your surgeon will make several small cuts (incisions) in your abdomen.  A thin, lighted tube with a tiny camera on the end (laparoscope) is inserted through one of the small incisions. The camera on the laparoscope sends a picture to a TV screen in the operating room. This gives the surgeon a good view inside your abdomen.  A gas will be pumped into your abdomen. This expands your abdomen so that the surgeon has more room to perform the surgery.  Other tools needed for the procedure are inserted through the other incisions. The gallbladder is removed through one of the incisions.  After the removal of your gallbladder, the incisions will be closed with stitches, staples, or skin glue. AFTER THE PROCEDURE  You will be taken to a recovery area where your progress will be checked often.  You may be allowed to go home the same day if your pain is controlled and you can tolerate liquids. Document Released: 02/02/2005 Document Revised: 11/23/2012 Document Reviewed: 09/14/2012 [facility] Patient Information 2015 [place], [place]. This information is not intended to replace advice given to you by your health care provider. Make sure you discuss any questions you have with your health care provider. PATIENT INSTRUCTIONS POST-ANESTHESIA  IMMEDIATELY FOLLOWING SURGERY:  Do not drive or operate machinery for the first twenty four hours after surgery.  Do not make any important decisions for twenty four hours after surgery or while taking narcotic pain medications or sedatives.  If you develop intractable nausea and vomiting or a severe headache please notify your doctor immediately.  FOLLOW-UP:  Please make an appointment with your surgeon as instructed. You do not need to follow up with anesthesia unless specifically instructed to do so.  WOUND CARE INSTRUCTIONS (if applicable):  Keep a dry  clean dressing on the anesthesia/puncture wound site if there is drainage.  Once the wound has quit draining you may leave it open to air.  Generally you should leave the bandage intact for twenty four hours unless there is drainage.  If the epidural site drains for more than 36-48 hours please call the anesthesia department.  QUESTIONS?:  Please feel free to call your physician or the hospital operator if you have any questions, and they will be happy to assist you.

## 2013-10-20 ENCOUNTER — Encounter (HOSPITAL_COMMUNITY): Payer: Self-pay | Admitting: *Deleted

## 2013-10-20 ENCOUNTER — Encounter (HOSPITAL_COMMUNITY)
Admission: RE | Disposition: A | Discharge status: Home / Self Care | Payer: Self-pay | Source: Ambulatory Visit | Attending: General Surgery

## 2013-10-20 ENCOUNTER — Ambulatory Visit (HOSPITAL_COMMUNITY)
Admission: RE | Admit: 2013-10-20 | Discharge: 2013-10-20 | Disposition: A | Discharge status: Home / Self Care | Payer: Medicare Other | Source: Ambulatory Visit | Attending: General Surgery | Admitting: General Surgery

## 2013-10-20 ENCOUNTER — Ambulatory Visit (HOSPITAL_COMMUNITY): Payer: Medicare Other | Admitting: Anesthesiology

## 2013-10-20 ENCOUNTER — Encounter (HOSPITAL_COMMUNITY): Payer: Medicare Other | Admitting: Anesthesiology

## 2013-10-20 DIAGNOSIS — M1990 Unspecified osteoarthritis, unspecified site: Secondary | ICD-10-CM | POA: Diagnosis not present

## 2013-10-20 DIAGNOSIS — K8010 Calculus of gallbladder with chronic cholecystitis without obstruction: Secondary | ICD-10-CM | POA: Diagnosis not present

## 2013-10-20 DIAGNOSIS — I10 Essential (primary) hypertension: Secondary | ICD-10-CM | POA: Insufficient documentation

## 2013-10-20 DIAGNOSIS — Z885 Allergy status to narcotic agent status: Secondary | ICD-10-CM | POA: Insufficient documentation

## 2013-10-20 DIAGNOSIS — E119 Type 2 diabetes mellitus without complications: Secondary | ICD-10-CM | POA: Diagnosis not present

## 2013-10-20 DIAGNOSIS — K219 Gastro-esophageal reflux disease without esophagitis: Secondary | ICD-10-CM | POA: Insufficient documentation

## 2013-10-20 DIAGNOSIS — F319 Bipolar disorder, unspecified: Secondary | ICD-10-CM | POA: Insufficient documentation

## 2013-10-20 HISTORY — PX: CHOLECYSTECTOMY: SHX55

## 2013-10-20 LAB — GLUCOSE, CAPILLARY
GLUCOSE-CAPILLARY: 120 mg/dL — AB (ref 70–99)
GLUCOSE-CAPILLARY: 137 mg/dL — AB (ref 70–99)

## 2013-10-20 SURGERY — LAPAROSCOPIC CHOLECYSTECTOMY
Anesthesia: General | Site: Abdomen

## 2013-10-20 MED ORDER — SUCCINYLCHOLINE CHLORIDE 20 MG/ML IJ SOLN
INTRAMUSCULAR | Status: DC | PRN
  Administered 2013-10-20: 160 mg via INTRAVENOUS

## 2013-10-20 MED ORDER — GLYCOPYRROLATE 0.2 MG/ML IJ SOLN
INTRAMUSCULAR | Status: AC
  Filled 2013-10-20: qty 1

## 2013-10-20 MED ORDER — GLYCOPYRROLATE 0.2 MG/ML IJ SOLN
INTRAMUSCULAR | Status: DC | PRN
  Administered 2013-10-20: 0.4 mg via INTRAVENOUS

## 2013-10-20 MED ORDER — CIPROFLOXACIN IN D5W 400 MG/200ML IV SOLN
INTRAVENOUS | Status: AC
  Filled 2013-10-20: qty 200

## 2013-10-20 MED ORDER — ONDANSETRON HCL 4 MG/2ML IJ SOLN
INTRAMUSCULAR | Status: AC
  Filled 2013-10-20: qty 2

## 2013-10-20 MED ORDER — DEXTROSE 5 % IV SOLN
INTRAVENOUS | Status: DC | PRN
  Administered 2013-10-20: 09:00:00 via INTRAVENOUS

## 2013-10-20 MED ORDER — NEOSTIGMINE METHYLSULFATE 10 MG/10ML IV SOLN
INTRAVENOUS | Status: DC | PRN
  Administered 2013-10-20: 2 mg via INTRAVENOUS
  Administered 2013-10-20 (×2): 1 mg via INTRAVENOUS

## 2013-10-20 MED ORDER — MIDAZOLAM HCL 2 MG/2ML IJ SOLN
INTRAMUSCULAR | Status: AC
  Filled 2013-10-20: qty 2

## 2013-10-20 MED ORDER — FENTANYL CITRATE 0.05 MG/ML IJ SOLN
INTRAMUSCULAR | Status: AC
  Filled 2013-10-20: qty 5

## 2013-10-20 MED ORDER — SODIUM CHLORIDE 0.9 % IR SOLN
Status: DC | PRN
  Administered 2013-10-20: 1000 mL

## 2013-10-20 MED ORDER — MIDAZOLAM HCL 2 MG/2ML IJ SOLN
1.0000 mg | INTRAMUSCULAR | Status: DC | PRN
  Administered 2013-10-20: 2 mg via INTRAVENOUS

## 2013-10-20 MED ORDER — PROPOFOL 10 MG/ML IV EMUL
INTRAVENOUS | Status: AC
  Filled 2013-10-20: qty 20

## 2013-10-20 MED ORDER — FENTANYL CITRATE 0.05 MG/ML IJ SOLN
INTRAMUSCULAR | Status: DC | PRN
  Administered 2013-10-20 (×3): 50 ug via INTRAVENOUS
  Administered 2013-10-20: 100 ug via INTRAVENOUS

## 2013-10-20 MED ORDER — LIDOCAINE HCL 1 % IJ SOLN
INTRAMUSCULAR | Status: DC | PRN
  Administered 2013-10-20: 50 mg via INTRADERMAL

## 2013-10-20 MED ORDER — ROCURONIUM BROMIDE 100 MG/10ML IV SOLN
INTRAVENOUS | Status: DC | PRN
  Administered 2013-10-20: 10 mg via INTRAVENOUS
  Administered 2013-10-20: 25 mg via INTRAVENOUS

## 2013-10-20 MED ORDER — HEMOSTATIC AGENTS (NO CHARGE) OPTIME
TOPICAL | Status: DC | PRN
  Administered 2013-10-20: 1

## 2013-10-20 MED ORDER — MIDAZOLAM HCL 5 MG/5ML IJ SOLN
INTRAMUSCULAR | Status: DC | PRN
  Administered 2013-10-20: 2 mg via INTRAVENOUS

## 2013-10-20 MED ORDER — NEOSTIGMINE METHYLSULFATE 10 MG/10ML IV SOLN
INTRAVENOUS | Status: AC
  Filled 2013-10-20: qty 1

## 2013-10-20 MED ORDER — PROPOFOL 10 MG/ML IV BOLUS
INTRAVENOUS | Status: DC | PRN
  Administered 2013-10-20: 140 mg via INTRAVENOUS

## 2013-10-20 MED ORDER — SUCCINYLCHOLINE CHLORIDE 20 MG/ML IJ SOLN
INTRAMUSCULAR | Status: AC
  Filled 2013-10-20: qty 1

## 2013-10-20 MED ORDER — GLYCOPYRROLATE 0.2 MG/ML IJ SOLN
0.2000 mg | Freq: Once | INTRAMUSCULAR | Status: AC
  Administered 2013-10-20: 0.2 mg via INTRAVENOUS

## 2013-10-20 MED ORDER — LACTATED RINGERS IV SOLN
INTRAVENOUS | Status: DC
  Administered 2013-10-20: 08:00:00 via INTRAVENOUS

## 2013-10-20 MED ORDER — KETOROLAC TROMETHAMINE 30 MG/ML IJ SOLN
30.0000 mg | Freq: Once | INTRAMUSCULAR | Status: AC
  Administered 2013-10-20: 30 mg via INTRAVENOUS
  Filled 2013-10-20: qty 1

## 2013-10-20 MED ORDER — POVIDONE-IODINE 10 % OINT PACKET
TOPICAL_OINTMENT | CUTANEOUS | Status: DC | PRN
  Administered 2013-10-20: 1 via TOPICAL

## 2013-10-20 MED ORDER — DEXAMETHASONE SODIUM PHOSPHATE 4 MG/ML IJ SOLN
INTRAMUSCULAR | Status: AC
  Filled 2013-10-20: qty 1

## 2013-10-20 MED ORDER — BUPIVACAINE HCL (PF) 0.5 % IJ SOLN
INTRAMUSCULAR | Status: AC
  Filled 2013-10-20: qty 30

## 2013-10-20 MED ORDER — CHLORHEXIDINE GLUCONATE 4 % EX LIQD: 1.0000 "application " | Freq: Once | CUTANEOUS | Status: DC

## 2013-10-20 MED ORDER — LIDOCAINE HCL (PF) 1 % IJ SOLN
INTRAMUSCULAR | Status: AC
  Filled 2013-10-20: qty 5

## 2013-10-20 MED ORDER — ROCURONIUM BROMIDE 50 MG/5ML IV SOLN
INTRAVENOUS | Status: AC
  Filled 2013-10-20: qty 1

## 2013-10-20 MED ORDER — ONDANSETRON HCL 4 MG/2ML IJ SOLN
4.0000 mg | Freq: Once | INTRAMUSCULAR | Status: AC
  Administered 2013-10-20: 4 mg via INTRAVENOUS

## 2013-10-20 MED ORDER — BUPIVACAINE HCL (PF) 0.5 % IJ SOLN
INTRAMUSCULAR | Status: DC | PRN
  Administered 2013-10-20: 10 mL

## 2013-10-20 MED ORDER — HYDROCODONE-ACETAMINOPHEN 5-325 MG PO TABS: 1.0000 | ORAL_TABLET | Freq: Four times a day (QID) | ORAL | Status: DC | PRN

## 2013-10-20 MED ORDER — CIPROFLOXACIN IN D5W 400 MG/200ML IV SOLN
400.0000 mg | INTRAVENOUS | Status: AC
  Administered 2013-10-20: 400 mg via INTRAVENOUS

## 2013-10-20 MED ORDER — GLYCOPYRROLATE 0.2 MG/ML IJ SOLN
INTRAMUSCULAR | Status: AC
  Filled 2013-10-20: qty 2

## 2013-10-20 MED ORDER — POVIDONE-IODINE 10 % EX OINT
TOPICAL_OINTMENT | CUTANEOUS | Status: AC
  Filled 2013-10-20: qty 1

## 2013-10-20 SURGICAL SUPPLY — 42 items
APPLIER CLIP LAPSCP 10X32 DD (CLIP) ×3 IMPLANT
BAG HAMPER (MISCELLANEOUS) ×3 IMPLANT
BAG SPEC RTRVL LRG 6X4 10 (ENDOMECHANICALS) ×1
BLADE 11 SAFETY STRL DISP (BLADE) ×3 IMPLANT
CLOTH BEACON ORANGE TIMEOUT ST (SAFETY) ×3 IMPLANT
COVER LIGHT HANDLE STERIS (MISCELLANEOUS) ×6 IMPLANT
DECANTER SPIKE VIAL GLASS SM (MISCELLANEOUS) ×3 IMPLANT
DURAPREP 26ML APPLICATOR (WOUND CARE) ×3 IMPLANT
ELECT REM PT RETURN 9FT ADLT (ELECTROSURGICAL) ×3
ELECTRODE REM PT RTRN 9FT ADLT (ELECTROSURGICAL) ×1 IMPLANT
FILTER SMOKE EVAC LAPAROSHD (FILTER) ×3 IMPLANT
FORMALIN 10 PREFIL 120ML (MISCELLANEOUS) ×3 IMPLANT
GLOVE BIOGEL PI IND STRL 7.0 (GLOVE) IMPLANT
GLOVE BIOGEL PI IND STRL 7.5 (GLOVE) IMPLANT
GLOVE BIOGEL PI INDICATOR 7.0 (GLOVE) ×2
GLOVE BIOGEL PI INDICATOR 7.5 (GLOVE) ×2
GLOVE EXAM NITRILE LRG STRL (GLOVE) ×2 IMPLANT
GLOVE SURG SS PI 7.5 STRL IVOR (GLOVE) ×3 IMPLANT
GOWN STRL REUS W/TWL LRG LVL3 (GOWN DISPOSABLE) ×9 IMPLANT
HEMOSTAT SNOW SURGICEL 2X4 (HEMOSTASIS) ×3 IMPLANT
INST SET LAPROSCOPIC AP (KITS) ×3 IMPLANT
KIT ROOM TURNOVER APOR (KITS) ×3 IMPLANT
MANIFOLD NEPTUNE II (INSTRUMENTS) ×3 IMPLANT
NDL INSUFFLATION 14GA 120MM (NEEDLE) ×1 IMPLANT
NEEDLE INSUFFLATION 14GA 120MM (NEEDLE) ×3 IMPLANT
NS IRRIG 1000ML POUR BTL (IV SOLUTION) ×3 IMPLANT
PACK LAP CHOLE LZT030E (CUSTOM PROCEDURE TRAY) ×3 IMPLANT
PAD ARMBOARD 7.5X6 YLW CONV (MISCELLANEOUS) ×3 IMPLANT
POUCH SPECIMEN RETRIEVAL 10MM (ENDOMECHANICALS) ×3 IMPLANT
SET BASIN LINEN APH (SET/KITS/TRAYS/PACK) ×3 IMPLANT
SLEEVE ENDOPATH XCEL 5M (ENDOMECHANICALS) ×3 IMPLANT
SPONGE GAUZE 2X2 8PLY STER LF (GAUZE/BANDAGES/DRESSINGS) ×4
SPONGE GAUZE 2X2 8PLY STRL LF (GAUZE/BANDAGES/DRESSINGS) ×8 IMPLANT
STAPLER VISISTAT (STAPLE) ×3 IMPLANT
SUT VICRYL 0 UR6 27IN ABS (SUTURE) ×3 IMPLANT
TAPE CLOTH SURG 4X10 WHT LF (GAUZE/BANDAGES/DRESSINGS) ×2 IMPLANT
TROCAR ENDO BLADELESS 11MM (ENDOMECHANICALS) ×3 IMPLANT
TROCAR XCEL NON-BLD 5MMX100MML (ENDOMECHANICALS) ×3 IMPLANT
TROCAR XCEL UNIV SLVE 11M 100M (ENDOMECHANICALS) ×3 IMPLANT
TUBING INSUFFLATION (TUBING) ×3 IMPLANT
WARMER LAPAROSCOPE (MISCELLANEOUS) ×3 IMPLANT
YANKAUER SUCT 12FT TUBE ARGYLE (SUCTIONS) ×3 IMPLANT

## 2013-10-20 NOTE — Anesthesia Postprocedure Evaluation (Signed)
  Anesthesia Post-op Note  Patient: [name]  Procedure(s) Performed: Procedure(s): LAPAROSCOPIC CHOLECYSTECTOMY (N/A)  Patient Location: PACU  Anesthesia Type:General  Level of Consciousness: awake, alert , oriented and patient cooperative  Airway and Oxygen Therapy: Patient Spontanous Breathing and Patient connected to face mask oxygen  Post-op Pain: 3 /10, mild  Post-op Assessment: Post-op Vital signs reviewed, Patient's Cardiovascular Status Stable, Respiratory Function Stable, Patent Airway and Pain level controlled  Post-op Vital Signs: Reviewed and stable  Last Vitals:  Filed Vitals:   10/20/13 0845  BP: 105/71  Pulse:   Temp:   Resp: 34    Complications: No apparent anesthesia complications

## 2013-10-20 NOTE — Op Note (Signed)
Patient:  [name]  DOB:  07-Mar-1950  MRN:  [id number]   Preop Diagnosis:  Cholelithiasis, biliary colic  Postop Diagnosis:  Same  Procedure:  Laparoscopic cholecystectomy  Surgeon:  Aviva Signs, M.D.  Anes:  General endotracheal  Indications:  Patient is a 63 year old white male who presents with biliary colic secondary to cholelithiasis. The risks and benefits of the procedure including bleeding, infection, hepatobiliary injury, and the possibility of an open procedure were fully explained to the patient, who gave informed consent.  Procedure note:  The patient is placed the supine position. After induction of general endotracheal anesthesia, the abdomen was prepped and draped using usual sterile technique with DuraPrep. Surgical site confirmation was performed.  A supraumbilical incision was made down to the fascia. A Veress needle was introduced into the abdominal cavity and confirmation of placement was done using the saline drop test. The abdomen was then insufflated to 16 mm mercury pressure. An 11 mm trocar was introduced into the abdominal cavity under direct visualization without difficulty. The patient was then placed in reverse Trendelenburg position and additional 11 mm trocar was placed the epigastric region and 5 mm trochars were placed the right upper quadrant and right flank regions. The liver was inspected and noted within normal limits. The gallbladder was then retracted in a dynamic fashion in order to expose the triangle of Calot. The cystic duct was first identified. Its juncture to the infundibulum was fully identified. Endoclips placed proximally and distally on the cystic duct, and the cystic duct was divided. This was likewise done cystic artery. The gallbladder was then freed away from the gallbladder fossa using Bovie electrocautery. The gallbladder was delivered through the epigastric trocar site using an Endo Catch bag. The gallbladder fossa was inspected and  no abnormal bleeding or bile leakage was noted. Surgicel is placed the gallbladder fossa. All fluid and air were then evacuated from the abdominal cavity prior to removal of the trochars.  All wounds were irrigated normal saline. All wounds were injected with 0.5% Sensorcaine. The supraumbilical fascia as well as epigastric fascia were reapproximated using 0 Vicryl interrupted sutures. All skin incisions were closed using staples. Betadine ointment and dry sterile dressings were applied.  All tape and needle counts were correct at the end of the procedure. Patient was extubated in the operating room and transferred to PACU in stable condition.  Complications:  None  EBL:  Minimal  Specimen:  Gallbladder

## 2013-10-20 NOTE — Anesthesia Procedure Notes (Signed)
Procedure Name: Intubation Date/Time: 10/20/2013 9:08 AM Performed by: Charmaine Downs Pre-anesthesia Checklist: Emergency Drugs available, Suction available, Patient being monitored and Patient identified Patient Re-evaluated:Patient Re-evaluated prior to inductionOxygen Delivery Method: Circle system utilized Preoxygenation: Pre-oxygenation with 100% oxygen Intubation Type: IV induction and Cricoid Pressure applied Ventilation: Mask ventilation without difficulty Laryngoscope Size: Mac and 3 Grade View: Grade II Tube type: Oral Tube size: 8.0 mm Number of attempts: 1 Airway Equipment and Method: Stylet and Oral airway Placement Confirmation: positive ETCO2,  ETT inserted through vocal cords under direct vision and breath sounds checked- equal and bilateral Secured at: 24 cm Tube secured with: Tape Dental Injury: Teeth and Oropharynx as per pre-operative assessment  Difficulty Due To: Difficulty was anticipated and Difficult Airway- due to anterior larynx

## 2013-10-20 NOTE — OR Nursing (Signed)
Mother took patients  [place] and rest of belongings placed in locker,   Glasses took to [employer]

## 2013-10-20 NOTE — Interval H&P Note (Signed)
History and Physical Interval Note:  10/20/2013 8:46 AM  [name]  has presented today for surgery, with the diagnosis of cholelithiasis with acute cholecystitis  The various methods of treatment have been discussed with the patient and family. After consideration of risks, benefits and other options for treatment, the patient has consented to  Procedure(s): LAPAROSCOPIC CHOLECYSTECTOMY (N/A) as a surgical intervention .  The patient's history has been reviewed, patient examined, no change in status, stable for surgery.  I have reviewed the patient's chart and labs.  Questions were answered to the patient's satisfaction.     Aviva Signs A

## 2013-10-20 NOTE — Anesthesia Preprocedure Evaluation (Signed)
Anesthesia Evaluation  Patient identified by MRN, date of birth, ID band Patient awake    Reviewed: Allergy & Precautions, H&P , NPO status , Patient's Chart, lab work & pertinent test results  Airway Mallampati: II TM Distance: >3 FB     Dental  (+) Teeth Intact   Pulmonary  breath sounds clear to auscultation        Cardiovascular hypertension, Pt. on medications Rhythm:Regular Rate:Normal     Neuro/Psych PSYCHIATRIC DISORDERS Bipolar Disorder    GI/Hepatic GERD-  Controlled,  Endo/Other  diabetes (borderline), Type 2  Renal/GU Renal disease     Musculoskeletal  (+) Arthritis -,   Abdominal   Peds  Hematology   Anesthesia Other Findings   Reproductive/Obstetrics                           Anesthesia Physical Anesthesia Plan  ASA: III  Anesthesia Plan: General   Post-op Pain Management:    Induction: Intravenous  Airway Management Planned: Oral ETT  Additional Equipment:   Intra-op Plan:   Post-operative Plan: Extubation in OR  Informed Consent: I have reviewed the patients History and Physical, chart, labs and discussed the procedure including the risks, benefits and alternatives for the proposed anesthesia with the patient or authorized representative who has indicated his/her understanding and acceptance.     Plan Discussed with:   Anesthesia Plan Comments:         Anesthesia Quick Evaluation

## 2013-10-20 NOTE — Transfer of Care (Signed)
Immediate Anesthesia Transfer of Care Note  Patient: [name]  Procedure(s) Performed: Procedure(s): LAPAROSCOPIC CHOLECYSTECTOMY (N/A)  Patient Location: PACU  Anesthesia Type:General  Level of Consciousness: awake and patient cooperative  Airway & Oxygen Therapy: Patient Spontanous Breathing and Patient connected to face mask oxygen  Post-op Assessment: Report given to PACU RN, Post -op Vital signs reviewed and stable and Patient moving all extremities  Post vital signs: Reviewed and stable  Complications: No apparent anesthesia complications

## 2013-10-20 NOTE — Discharge Instructions (Signed)

## 2013-10-24 ENCOUNTER — Encounter (HOSPITAL_COMMUNITY): Payer: Self-pay | Admitting: General Surgery

## 2013-12-08 ENCOUNTER — Ambulatory Visit (INDEPENDENT_AMBULATORY_CARE_PROVIDER_SITE_OTHER): Payer: Medicare Other | Admitting: Urology

## 2013-12-08 DIAGNOSIS — N200 Calculus of kidney: Secondary | ICD-10-CM

## 2013-12-08 DIAGNOSIS — N400 Enlarged prostate without lower urinary tract symptoms: Secondary | ICD-10-CM

## 2014-03-14 DIAGNOSIS — H4011X1 Primary open-angle glaucoma, mild stage: Secondary | ICD-10-CM | POA: Diagnosis not present

## 2014-03-14 DIAGNOSIS — E119 Type 2 diabetes mellitus without complications: Secondary | ICD-10-CM | POA: Diagnosis not present

## 2014-03-14 DIAGNOSIS — H2513 Age-related nuclear cataract, bilateral: Secondary | ICD-10-CM | POA: Diagnosis not present

## 2014-03-15 DIAGNOSIS — I129 Hypertensive chronic kidney disease with stage 1 through stage 4 chronic kidney disease, or unspecified chronic kidney disease: Secondary | ICD-10-CM | POA: Diagnosis not present

## 2014-03-15 DIAGNOSIS — E1121 Type 2 diabetes mellitus with diabetic nephropathy: Secondary | ICD-10-CM | POA: Diagnosis not present

## 2014-03-15 DIAGNOSIS — M545 Low back pain: Secondary | ICD-10-CM | POA: Diagnosis not present

## 2014-03-15 DIAGNOSIS — I10 Essential (primary) hypertension: Secondary | ICD-10-CM | POA: Diagnosis not present

## 2014-03-29 DIAGNOSIS — L219 Seborrheic dermatitis, unspecified: Secondary | ICD-10-CM | POA: Diagnosis not present

## 2014-04-10 DIAGNOSIS — E1142 Type 2 diabetes mellitus with diabetic polyneuropathy: Secondary | ICD-10-CM | POA: Diagnosis not present

## 2014-04-10 DIAGNOSIS — L851 Acquired keratosis [keratoderma] palmaris et plantaris: Secondary | ICD-10-CM | POA: Diagnosis not present

## 2014-04-10 DIAGNOSIS — B351 Tinea unguium: Secondary | ICD-10-CM | POA: Diagnosis not present

## 2014-06-04 ENCOUNTER — Other Ambulatory Visit (HOSPITAL_COMMUNITY)
Admission: RE | Admit: 2014-06-04 | Discharge: 2014-06-04 | Disposition: A | Discharge status: Home / Self Care | Payer: Medicare Other | Source: Ambulatory Visit | Attending: Urology | Admitting: Urology

## 2014-06-04 DIAGNOSIS — N400 Enlarged prostate without lower urinary tract symptoms: Secondary | ICD-10-CM | POA: Insufficient documentation

## 2014-06-05 LAB — PSA: PSA: 1.49 ng/mL (ref <=4.00)

## 2014-06-08 ENCOUNTER — Ambulatory Visit (INDEPENDENT_AMBULATORY_CARE_PROVIDER_SITE_OTHER): Payer: Medicare Other | Admitting: Urology

## 2014-06-08 DIAGNOSIS — N400 Enlarged prostate without lower urinary tract symptoms: Secondary | ICD-10-CM

## 2014-06-08 DIAGNOSIS — N200 Calculus of kidney: Secondary | ICD-10-CM

## 2014-06-11 DIAGNOSIS — N19 Unspecified kidney failure: Secondary | ICD-10-CM | POA: Diagnosis not present

## 2014-06-11 DIAGNOSIS — E1121 Type 2 diabetes mellitus with diabetic nephropathy: Secondary | ICD-10-CM | POA: Diagnosis not present

## 2014-06-11 DIAGNOSIS — I129 Hypertensive chronic kidney disease with stage 1 through stage 4 chronic kidney disease, or unspecified chronic kidney disease: Secondary | ICD-10-CM | POA: Diagnosis not present

## 2014-06-19 DIAGNOSIS — L851 Acquired keratosis [keratoderma] palmaris et plantaris: Secondary | ICD-10-CM | POA: Diagnosis not present

## 2014-06-19 DIAGNOSIS — B351 Tinea unguium: Secondary | ICD-10-CM | POA: Diagnosis not present

## 2014-06-19 DIAGNOSIS — E1142 Type 2 diabetes mellitus with diabetic polyneuropathy: Secondary | ICD-10-CM | POA: Diagnosis not present

## 2014-06-20 DIAGNOSIS — E1121 Type 2 diabetes mellitus with diabetic nephropathy: Secondary | ICD-10-CM | POA: Diagnosis not present

## 2014-06-20 DIAGNOSIS — R400 Somnolence: Secondary | ICD-10-CM | POA: Diagnosis not present

## 2014-06-20 DIAGNOSIS — I129 Hypertensive chronic kidney disease with stage 1 through stage 4 chronic kidney disease, or unspecified chronic kidney disease: Secondary | ICD-10-CM | POA: Diagnosis not present

## 2014-06-20 DIAGNOSIS — I10 Essential (primary) hypertension: Secondary | ICD-10-CM | POA: Diagnosis not present

## 2014-06-21 ENCOUNTER — Other Ambulatory Visit (HOSPITAL_COMMUNITY): Payer: Self-pay | Admitting: Respiratory Therapy

## 2014-06-21 DIAGNOSIS — G4730 Sleep apnea, unspecified: Secondary | ICD-10-CM

## 2014-06-21 DIAGNOSIS — G4710 Hypersomnia, unspecified: Secondary | ICD-10-CM

## 2014-07-12 ENCOUNTER — Ambulatory Visit: Payer: Medicare Other | Attending: Pulmonary Disease | Admitting: Sleep Medicine

## 2014-07-12 DIAGNOSIS — G4730 Sleep apnea, unspecified: Secondary | ICD-10-CM

## 2014-07-12 DIAGNOSIS — G4710 Hypersomnia, unspecified: Secondary | ICD-10-CM

## 2014-07-12 DIAGNOSIS — G4733 Obstructive sleep apnea (adult) (pediatric): Secondary | ICD-10-CM | POA: Diagnosis not present

## 2014-07-14 NOTE — Sleep Study (Signed)
  [place] A. Merlene Laughter, MD     [email or web address]        NOCTURNAL POLYSOMNOGRAM    LOCATION: SLEEP LAB FACILITY: [facility]   PHYSICIAN: Abimael Zeiter A. Merlene Laughter, M.D.   DATE OF STUDY: 07/12/2014.   REFERRING PHYSICIAN: Sinda Du.   INDICATIONS: The patient is a 64 year old who presents with fatigue, loud snoring, difficulty swallowing sleep and witnessed apneas.  MEDICATIONS:  Prior to Admission medications   Medication Sig Start Date End Date Taking? Authorizing Provider  amLODipine (NORVASC) 5 MG tablet Take 5 mg by mouth daily. 08/30/13   Historical Provider, MD  ARIPiprazole (ABILIFY) 15 MG tablet Take 7.5 mg by mouth daily. 09/13/13   Historical Provider, MD  Calcium Carbonate-Vitamin D (CALTRATE 600+D PO) Take 1 tablet by mouth 2 (two) times daily.    Historical Provider, MD  hydrochlorothiazide (HYDRODIURIL) 25 MG tablet Take 25 mg by mouth daily. 07/19/13   Historical Provider, MD  HYDROcodone-acetaminophen (NORCO/VICODIN) 5-325 MG per tablet Take 1-2 tablets by mouth every 6 (six) hours as needed for moderate pain. 10/20/13   Aviva Signs Md, MD  lisinopril (PRINIVIL,ZESTRIL) 10 MG tablet Take 10 mg by mouth daily. 08/30/13   Historical Provider, MD  omeprazole (PRILOSEC) 20 MG capsule Take 20 mg by mouth daily. 09/13/13   Historical Provider, MD  potassium chloride SA (K-DUR,KLOR-CON) 20 MEQ tablet Take 20 mEq by mouth daily. 09/13/13   Historical Provider, MD  vitamin C (ASCORBIC ACID) 500 MG tablet Take 500 mg by mouth daily.    Historical Provider, MD      EPWORTH SLEEPINESS SCALE: 8.   BMI: 32.   ARCHITECTURAL SUMMARY: Total recording time was 467 minutes. Sleep efficiency 25 %. Sleep latency 44 minutes. REM latency 375 minutes. Stage NI 33 %, N2 60 % and N3 2 % and REM sleep 5 %.    RESPIRATORY DATA:  Baseline oxygen saturation is 95 %. The lowest saturation is 88 %. The diagnostic AHI is 30. The RDI is 34. The REM AHI is 28. The patient could not be  titrated because of poor sleep efficiency.  LIMB MOVEMENT SUMMARY: PLM index 0.   ELECTROCARDIOGRAM SUMMARY: Average heart rate is 70. Frequent premature ventricular complexes are observed.   IMPRESSION:  1. Moderate obstructive sleep apnea syndrome. AutoPap of 9-15 is suggested. 2. Frequent premature ventricular complexes. 3. Abnormal sleep architecture with very poor sleep efficiency and reduced slow-wave sleep.  Thanks for this referral.  Hong Timm A. Merlene Laughter, M.D. Diplomat, Tax adviser of Sleep Medicine.

## 2014-08-28 DIAGNOSIS — B351 Tinea unguium: Secondary | ICD-10-CM | POA: Diagnosis not present

## 2014-08-28 DIAGNOSIS — E1142 Type 2 diabetes mellitus with diabetic polyneuropathy: Secondary | ICD-10-CM | POA: Diagnosis not present

## 2014-08-28 DIAGNOSIS — L851 Acquired keratosis [keratoderma] palmaris et plantaris: Secondary | ICD-10-CM | POA: Diagnosis not present

## 2014-09-25 DIAGNOSIS — I129 Hypertensive chronic kidney disease with stage 1 through stage 4 chronic kidney disease, or unspecified chronic kidney disease: Secondary | ICD-10-CM | POA: Diagnosis not present

## 2014-09-25 DIAGNOSIS — E1121 Type 2 diabetes mellitus with diabetic nephropathy: Secondary | ICD-10-CM | POA: Diagnosis not present

## 2014-09-25 DIAGNOSIS — G4733 Obstructive sleep apnea (adult) (pediatric): Secondary | ICD-10-CM | POA: Diagnosis not present

## 2014-10-08 DIAGNOSIS — G4733 Obstructive sleep apnea (adult) (pediatric): Secondary | ICD-10-CM | POA: Diagnosis not present

## 2014-11-05 DIAGNOSIS — H4011X1 Primary open-angle glaucoma, mild stage: Secondary | ICD-10-CM | POA: Diagnosis not present

## 2014-11-09 ENCOUNTER — Other Ambulatory Visit: Payer: Self-pay | Admitting: Urology

## 2014-11-09 DIAGNOSIS — E1142 Type 2 diabetes mellitus with diabetic polyneuropathy: Secondary | ICD-10-CM | POA: Diagnosis not present

## 2014-11-09 DIAGNOSIS — L851 Acquired keratosis [keratoderma] palmaris et plantaris: Secondary | ICD-10-CM | POA: Diagnosis not present

## 2014-11-09 DIAGNOSIS — B351 Tinea unguium: Secondary | ICD-10-CM | POA: Diagnosis not present

## 2014-11-09 DIAGNOSIS — N200 Calculus of kidney: Secondary | ICD-10-CM

## 2014-12-04 DIAGNOSIS — M545 Low back pain: Secondary | ICD-10-CM | POA: Diagnosis not present

## 2014-12-04 DIAGNOSIS — G4733 Obstructive sleep apnea (adult) (pediatric): Secondary | ICD-10-CM | POA: Diagnosis not present

## 2014-12-04 DIAGNOSIS — E1121 Type 2 diabetes mellitus with diabetic nephropathy: Secondary | ICD-10-CM | POA: Diagnosis not present

## 2014-12-04 DIAGNOSIS — I129 Hypertensive chronic kidney disease with stage 1 through stage 4 chronic kidney disease, or unspecified chronic kidney disease: Secondary | ICD-10-CM | POA: Diagnosis not present

## 2014-12-05 ENCOUNTER — Ambulatory Visit (HOSPITAL_COMMUNITY)
Admission: RE | Admit: 2014-12-05 | Discharge: 2014-12-05 | Disposition: A | Discharge status: Home / Self Care | Payer: Medicare Other | Source: Ambulatory Visit | Attending: Urology | Admitting: Urology

## 2014-12-05 DIAGNOSIS — N200 Calculus of kidney: Secondary | ICD-10-CM | POA: Diagnosis not present

## 2014-12-05 DIAGNOSIS — N261 Atrophy of kidney (terminal): Secondary | ICD-10-CM | POA: Diagnosis not present

## 2014-12-14 ENCOUNTER — Ambulatory Visit (INDEPENDENT_AMBULATORY_CARE_PROVIDER_SITE_OTHER): Payer: Medicare Other | Admitting: Urology

## 2014-12-14 DIAGNOSIS — N200 Calculus of kidney: Secondary | ICD-10-CM

## 2014-12-14 DIAGNOSIS — N281 Cyst of kidney, acquired: Secondary | ICD-10-CM | POA: Diagnosis not present

## 2014-12-14 DIAGNOSIS — N400 Enlarged prostate without lower urinary tract symptoms: Secondary | ICD-10-CM | POA: Diagnosis not present

## 2015-06-11 DIAGNOSIS — E1121 Type 2 diabetes mellitus with diabetic nephropathy: Secondary | ICD-10-CM | POA: Diagnosis not present

## 2015-06-11 DIAGNOSIS — F329 Major depressive disorder, single episode, unspecified: Secondary | ICD-10-CM | POA: Diagnosis not present

## 2015-06-11 DIAGNOSIS — I129 Hypertensive chronic kidney disease with stage 1 through stage 4 chronic kidney disease, or unspecified chronic kidney disease: Secondary | ICD-10-CM | POA: Diagnosis not present

## 2015-06-11 DIAGNOSIS — M545 Low back pain: Secondary | ICD-10-CM | POA: Diagnosis not present

## 2015-06-18 DIAGNOSIS — K210 Gastro-esophageal reflux disease with esophagitis: Secondary | ICD-10-CM | POA: Diagnosis not present

## 2015-06-18 DIAGNOSIS — M545 Low back pain: Secondary | ICD-10-CM | POA: Diagnosis not present

## 2015-06-18 DIAGNOSIS — E1121 Type 2 diabetes mellitus with diabetic nephropathy: Secondary | ICD-10-CM | POA: Diagnosis not present

## 2015-06-18 DIAGNOSIS — E1165 Type 2 diabetes mellitus with hyperglycemia: Secondary | ICD-10-CM | POA: Diagnosis not present

## 2015-07-22 DIAGNOSIS — M545 Low back pain: Secondary | ICD-10-CM | POA: Diagnosis not present

## 2015-07-22 DIAGNOSIS — I129 Hypertensive chronic kidney disease with stage 1 through stage 4 chronic kidney disease, or unspecified chronic kidney disease: Secondary | ICD-10-CM | POA: Diagnosis not present

## 2015-07-22 DIAGNOSIS — E1121 Type 2 diabetes mellitus with diabetic nephropathy: Secondary | ICD-10-CM | POA: Diagnosis not present

## 2015-07-22 DIAGNOSIS — K219 Gastro-esophageal reflux disease without esophagitis: Secondary | ICD-10-CM | POA: Diagnosis not present

## 2015-07-31 DIAGNOSIS — Z6831 Body mass index (BMI) 31.0-31.9, adult: Secondary | ICD-10-CM | POA: Diagnosis not present

## 2015-07-31 DIAGNOSIS — J019 Acute sinusitis, unspecified: Secondary | ICD-10-CM | POA: Diagnosis not present

## 2015-08-03 ENCOUNTER — Encounter (HOSPITAL_COMMUNITY): Payer: Self-pay | Admitting: Emergency Medicine

## 2015-08-03 ENCOUNTER — Emergency Department (HOSPITAL_COMMUNITY)
Admission: EM | Admit: 2015-08-03 | Discharge: 2015-08-03 | Disposition: A | Discharge status: Home / Self Care | Payer: Medicare Other | Attending: Emergency Medicine | Admitting: Emergency Medicine

## 2015-08-03 DIAGNOSIS — Z79899 Other long term (current) drug therapy: Secondary | ICD-10-CM | POA: Insufficient documentation

## 2015-08-03 DIAGNOSIS — J018 Other acute sinusitis: Secondary | ICD-10-CM | POA: Diagnosis not present

## 2015-08-03 DIAGNOSIS — I10 Essential (primary) hypertension: Secondary | ICD-10-CM | POA: Diagnosis not present

## 2015-08-03 DIAGNOSIS — E119 Type 2 diabetes mellitus without complications: Secondary | ICD-10-CM | POA: Insufficient documentation

## 2015-08-03 DIAGNOSIS — F319 Bipolar disorder, unspecified: Secondary | ICD-10-CM | POA: Insufficient documentation

## 2015-08-03 DIAGNOSIS — M1990 Unspecified osteoarthritis, unspecified site: Secondary | ICD-10-CM | POA: Diagnosis not present

## 2015-08-03 DIAGNOSIS — J328 Other chronic sinusitis: Secondary | ICD-10-CM | POA: Diagnosis not present

## 2015-08-03 DIAGNOSIS — J329 Chronic sinusitis, unspecified: Secondary | ICD-10-CM

## 2015-08-03 DIAGNOSIS — J029 Acute pharyngitis, unspecified: Secondary | ICD-10-CM

## 2015-08-03 LAB — CBG MONITORING, ED: Glucose-Capillary: 288 mg/dL — ABNORMAL HIGH (ref 65–99)

## 2015-08-03 MED ORDER — MAGIC MOUTHWASH W/LIDOCAINE: ORAL | Status: DC

## 2015-08-03 MED ORDER — OXYMETAZOLINE HCL 0.05 % NA SOLN
1.0000 | Freq: Once | NASAL | Status: AC
  Administered 2015-08-03: 1 via NASAL
  Filled 2015-08-03: qty 15

## 2015-08-03 MED ORDER — DEXAMETHASONE SODIUM PHOSPHATE 4 MG/ML IJ SOLN
8.0000 mg | Freq: Once | INTRAMUSCULAR | Status: AC
  Administered 2015-08-03: 8 mg via INTRAMUSCULAR
  Filled 2015-08-03: qty 2

## 2015-08-03 NOTE — Discharge Instructions (Signed)
Please use 2 squirts of afrin every 8 hours for five(5) days only. Gargle with magic mouthwash three times daily. Use tylenol or ibuprofen for pain or fever. See Dr Luan Pulling for additional evaluation if not improving. Pharyngitis Pharyngitis is a sore throat (pharynx). There is redness, pain, and swelling of your throat. HOME CARE   Drink enough fluids to keep your pee (urine) clear or pale yellow.  Only take medicine as told by your doctor.  You may get sick again if you do not take medicine as told. Finish your medicines, even if you start to feel better.  Do not take aspirin.  Rest.  Rinse your mouth (gargle) with salt water ( tsp of salt per 1 qt of water) every 1-2 hours. This will help the pain.  If you are not at risk for choking, you can suck on hard candy or sore throat lozenges. GET HELP IF:  You have large, tender lumps on your neck.  You have a rash.  You cough up green, yellow-brown, or bloody spit. GET HELP RIGHT AWAY IF:   You have a stiff neck.  You drool or cannot swallow liquids.  You throw up (vomit) or are not able to keep medicine or liquids down.  You have very bad pain that does not go away with medicine.  You have problems breathing (not from a stuffy nose). MAKE SURE YOU:   Understand these instructions.  Will watch your condition.  Will get help right away if you are not doing well or get worse.   This information is not intended to replace advice given to you by your health care provider. Make sure you discuss any questions you have with your health care provider.   Document Released: 07/22/2007 Document Revised: 11/23/2012 Document Reviewed: 10/10/2012 Elsevier Interactive Patient Education 2016 Elsevier Inc.  Sinusitis, Adult Sinusitis is redness, soreness, and puffiness (inflammation) of the air pockets in the bones of your face (sinuses). The redness, soreness, and puffiness can cause air and mucus to get trapped in your sinuses. This  can allow germs to grow and cause an infection.  HOME CARE   Drink enough fluids to keep your pee (urine) clear or pale yellow.  Use a humidifier in your home.  Run a hot shower to create steam in the bathroom. Sit in the bathroom with the door closed. Breathe in the steam 3-4 times a day.  Put a warm, moist washcloth on your face 3-4 times a day, or as told by your doctor.  Use salt water sprays (saline sprays) to wet the thick fluid in your nose. This can help the sinuses drain.  Only take medicine as told by your doctor. GET HELP RIGHT AWAY IF:   Your pain gets worse.  You have very bad headaches.  You are sick to your stomach (nauseous).  You throw up (vomit).  You are very sleepy (drowsy) all the time.  Your face is puffy (swollen).  Your vision changes.  You have a stiff neck.  You have trouble breathing. MAKE SURE YOU:   Understand these instructions.  Will watch your condition.  Will get help right away if you are not doing well or get worse.   This information is not intended to replace advice given to you by your health care provider. Make sure you discuss any questions you have with your health care provider.   Document Released: 07/22/2007 Document Revised: 02/23/2014 Document Reviewed: 09/08/2011 Elsevier Interactive Patient Education [employer].

## 2015-08-03 NOTE — ED Provider Notes (Signed)
CSN: [id number]     Arrival date & time 08/03/15  0933 History   First MD Initiated Contact with Patient 08/03/15 0935     Chief Complaint  Patient presents with  . Sore Throat     (Consider location/radiation/quality/duration/timing/severity/associated sxs/prior Treatment) Patient is a 65 y.o. male presenting with pharyngitis. The history is provided by the patient.  Sore Throat This is a recurrent problem. The current episode started more than 1 month ago. The problem occurs intermittently. The problem has been gradually worsening. Associated symptoms include arthralgias, congestion and a sore throat. Pertinent negatives include no chills, fever, headaches or rash. The symptoms are aggravated by swallowing. Treatments tried: antibiotics. The treatment provided no relief.    Past Medical History  Diagnosis Date  . Diabetes mellitus without complication ([place])     boderline  . Hypertension   . Arthritis   . Kidney stones   . Heart murmur 1995  . Bipolar disorder [facility]    Past Surgical History  Procedure Laterality Date  . Knee surgery    . Appendectomy    . Hip surgery    . Cholecystectomy N/A 10/20/2013    Procedure: LAPAROSCOPIC CHOLECYSTECTOMY;  Surgeon: [name], MD;  Location: [facility];  Service: General;  Laterality: N/A;   History reviewed. No pertinent family history. Social History  Substance Use Topics  . Smoking status: Never Smoker   . Smokeless tobacco: Never Used  . Alcohol Use: No    Review of Systems  Constitutional: Negative for fever and chills.  HENT: Positive for congestion and sore throat.   Musculoskeletal: Positive for arthralgias.  Skin: Negative for rash.  Neurological: Negative for headaches.  All other systems reviewed and are negative.     Allergies  Codeine and Morphine and related  Home Medications   Prior to Admission medications   Medication Sig Start Date End Date Taking? Authorizing Provider  amLODipine (NORVASC) 5 MG  tablet Take 5 mg by mouth daily. 08/30/13   Historical Provider, MD  ARIPiprazole (ABILIFY) 15 MG tablet Take 7.5 mg by mouth daily. 09/13/13   Historical Provider, MD  Calcium Carbonate-Vitamin D (CALTRATE 600+D PO) Take 1 tablet by mouth 2 (two) times daily.    Historical Provider, MD  hydrochlorothiazide (HYDRODIURIL) 25 MG tablet Take 25 mg by mouth daily. 07/19/13   Historical Provider, MD  HYDROcodone-acetaminophen (NORCO/VICODIN) 5-325 MG per tablet Take 1-2 tablets by mouth every 6 (six) hours as needed for moderate pain. 10/20/13   Aviva Signs, MD  lisinopril (PRINIVIL,ZESTRIL) 10 MG tablet Take 10 mg by mouth daily. 08/30/13   Historical Provider, MD  omeprazole (PRILOSEC) 20 MG capsule Take 20 mg by mouth daily. 09/13/13   Historical Provider, MD  potassium chloride SA (K-DUR,KLOR-CON) 20 MEQ tablet Take 20 mEq by mouth daily. 09/13/13   Historical Provider, MD  vitamin C (ASCORBIC ACID) 500 MG tablet Take 500 mg by mouth daily.    Historical Provider, MD   BP 130/59 mmHg  Pulse 56  Temp(Src) 98.5 F (36.9 C) (Oral)  Resp 20  Ht '5\' 4"'$  (1.626 m)  Wt 87.544 kg  BMI 33.11 kg/m2  SpO2 98% Physical Exam  Constitutional: He is oriented to person, place, and time. He appears well-developed and well-nourished.  Non-toxic appearance.  HENT:  Head: Normocephalic.  Right Ear: Tympanic membrane and external ear normal.  Left Ear: Tympanic membrane and external ear normal.  Beefy redness of the tongue. No swelling of the tongue. Airway patent. Mild increase  redness of the posterior pharynx.  Speech understandable. Nasal congestion present.  Eyes: EOM and lids are normal. Pupils are equal, round, and reactive to light.  Neck: Normal range of motion. Neck supple. Carotid bruit is not present.  Cardiovascular: Normal rate, regular rhythm, normal heart sounds, intact distal pulses and normal pulses.   Pulmonary/Chest: Breath sounds normal. No respiratory distress.  Abdominal: Soft. Bowel sounds  are normal. There is no tenderness. There is no guarding.  Musculoskeletal: Normal range of motion.  Lymphadenopathy:       Head (right side): No submandibular adenopathy present.       Head (left side): No submandibular adenopathy present.    He has no cervical adenopathy.  Neurological: He is alert and oriented to person, place, and time. He has normal strength. No cranial nerve deficit or sensory deficit.  Skin: Skin is warm and dry. No rash noted.  Psychiatric: He has a normal mood and affect. His speech is normal.  Nursing note and vitals reviewed.   ED Course  Procedures (including critical care time) Labs Review Labs Reviewed  CBG MONITORING, ED - Abnormal; Notable for the following:    Glucose-Capillary 288 (*)    All other components within normal limits    Imaging Review No results found. I have personally reviewed and evaluated these images and lab results as part of my medical decision-making.   EKG Interpretation None       MDM  Exam favors sinusitis and pharyngitis. No mass noted. No hoarseness reported. No hx of smoking or chewing tobacco hx. No family hx of oral/throat cancer. Pt finishing cephalosporin antibiotic.  No high fever. Vital signs stable. Pt treated with IM  Steroid, afrin and magic mouthwash gargle. Pt to follow up with PCP if not improving.   Final diagnoses:  None    *I have reviewed nursing notes, vital signs, and all appropriate lab and imaging results for this patient.Lily Kocher, PA-C 08/03/15 [place], MD 08/03/15 1150

## 2015-08-03 NOTE — ED Notes (Signed)
Patient c/o sore throat with "nasal drainage to back of throat." Per patient sore throat  x5-6 weeks in which he has bee seen for twice by PCP. Per patient given antibiotics in which he finished with no relief. Per patient tongue is now sore. Patient does report coughing and sneezing but denies any fevers and states cough is nonproductive.

## 2015-08-03 NOTE — ED Notes (Signed)
When asked about documented hx of diabetes-patient states he is "a board[place] line diabetic." Per patient checked blood glucose Friday-146.

## 2015-08-04 ENCOUNTER — Encounter (HOSPITAL_COMMUNITY): Payer: Self-pay | Admitting: *Deleted

## 2015-08-04 ENCOUNTER — Emergency Department (HOSPITAL_COMMUNITY)
Admission: EM | Admit: 2015-08-04 | Discharge: 2015-08-05 | Disposition: A | Discharge status: Home / Self Care | Payer: Medicare Other | Attending: Emergency Medicine | Admitting: Emergency Medicine

## 2015-08-04 ENCOUNTER — Emergency Department (HOSPITAL_COMMUNITY): Payer: Medicare Other

## 2015-08-04 DIAGNOSIS — Z79899 Other long term (current) drug therapy: Secondary | ICD-10-CM | POA: Insufficient documentation

## 2015-08-04 DIAGNOSIS — I10 Essential (primary) hypertension: Secondary | ICD-10-CM | POA: Insufficient documentation

## 2015-08-04 DIAGNOSIS — R05 Cough: Secondary | ICD-10-CM | POA: Insufficient documentation

## 2015-08-04 DIAGNOSIS — J029 Acute pharyngitis, unspecified: Secondary | ICD-10-CM | POA: Insufficient documentation

## 2015-08-04 DIAGNOSIS — R0982 Postnasal drip: Secondary | ICD-10-CM | POA: Diagnosis not present

## 2015-08-04 DIAGNOSIS — R059 Cough, unspecified: Secondary | ICD-10-CM

## 2015-08-04 DIAGNOSIS — M1990 Unspecified osteoarthritis, unspecified site: Secondary | ICD-10-CM | POA: Diagnosis not present

## 2015-08-04 DIAGNOSIS — E119 Type 2 diabetes mellitus without complications: Secondary | ICD-10-CM | POA: Diagnosis not present

## 2015-08-04 DIAGNOSIS — F319 Bipolar disorder, unspecified: Secondary | ICD-10-CM | POA: Insufficient documentation

## 2015-08-04 MED ORDER — LORATADINE 10 MG PO TABS: 10.0000 mg | ORAL_TABLET | Freq: Every day | ORAL | Status: DC

## 2015-08-04 NOTE — ED Notes (Signed)
Pt seen and evaluated by EDNP for initial assessment. 

## 2015-08-04 NOTE — ED Notes (Signed)
Pt c/o dry cough x 2 days

## 2015-08-04 NOTE — ED Provider Notes (Signed)
CSN: [id number]     Arrival date & time 08/04/15  2023 History  By signing my name below, I, Hansel Feinstein, attest that this documentation has been prepared under the direction and in the presence of Merryl Hacker, MD. Electronically Signed: Hansel Feinstein, ED Scribe. 08/04/2015. 11:46 PM.     Chief Complaint  Patient presents with  . Cough   The history is provided by the patient. No language interpreter was used.    HPI Comments: [name] is a 65 y.o. male who presents to the Emergency Department complaining of moderate, persistent, intermittent dry cough onset 6 weeks ago and worsened tonight. Pt also complains of ongoing sore throat and post-nasal drip. In addition to being seen by his PCP twice for these complaints, pt was also seen in the ED yesterday, dx with sinusitis and pharyngitis and given rx for cephalosporin. Pt states that this treatment provided no relief of his cough. He also reports some abdominal discomfort secondary to coughing. Pt has not tried decongestants or antihistamines for his current symptoms. He denies itchy or watery eyes, fever, leg swelling, CP. Pt is a non-smoker.   Past Medical History  Diagnosis Date  . Diabetes mellitus without complication ([place])     boderline  . Hypertension   . Arthritis   . Kidney stones   . Heart murmur 1995  . Bipolar disorder [facility])    Past Surgical History  Procedure Laterality Date  . Knee surgery    . Appendectomy    . Hip surgery    . Cholecystectomy N/A 10/20/2013    Procedure: LAPAROSCOPIC CHOLECYSTECTOMY;  Surgeon: [name], MD;  Location: [facility];  Service: General;  Laterality: N/A;   History reviewed. No pertinent family history. Social History  Substance Use Topics  . Smoking status: Never Smoker   . Smokeless tobacco: Never Used  . Alcohol Use: No    Review of Systems  Constitutional: Negative for fever.  HENT: Positive for postnasal drip and sore throat.   Eyes: Negative for discharge and  itching.  Respiratory: Positive for cough. Negative for shortness of breath.   Cardiovascular: Negative for chest pain and leg swelling.  Gastrointestinal: Negative for nausea, vomiting and diarrhea. Abdominal pain: secondary to cough.  All other systems reviewed and are negative.  Allergies  Codeine and Morphine and related  Home Medications   Prior to Admission medications   Medication Sig Start Date End Date Taking? Authorizing Provider  acetaminophen (TYLENOL) 325 MG tablet Take 650 mg by mouth every 6 (six) hours as needed for mild pain or moderate pain.   Yes Historical Provider, MD  cholecalciferol (VITAMIN D) 1000 units tablet Take 1,000 Units by mouth daily.   Yes Historical Provider, MD  lidocaine (XYLOCAINE) 2 % solution Use as directed 20 mLs in the mouth or throat 3 (three) times daily.  08/03/15  Yes Historical Provider, MD  omeprazole (PRILOSEC) 20 MG capsule Take 20 mg by mouth daily. 09/13/13  Yes Historical Provider, MD  potassium chloride SA (K-DUR,KLOR-CON) 20 MEQ tablet Take 20 mEq by mouth daily. 09/13/13  Yes Historical Provider, MD  pravastatin (PRAVACHOL) 20 MG tablet Take 20 mg by mouth at bedtime. 05/07/15  Yes Historical Provider, MD  vitamin C (ASCORBIC ACID) 500 MG tablet Take 500 mg by mouth daily.   Yes Historical Provider, MD  amLODipine (NORVASC) 5 MG tablet Take 5 mg by mouth daily. 08/30/13   Historical Provider, MD  cefUROXime (CEFTIN) 250 MG tablet Take  250 mg by mouth 2 (two) times daily. 7 day course starting on 07/31/2015 07/31/15   Historical Provider, MD  hydrochlorothiazide (HYDRODIURIL) 25 MG tablet Take 25 mg by mouth daily. 07/19/13   Historical Provider, MD  HYDROcodone-acetaminophen (NORCO/VICODIN) 5-325 MG per tablet Take 1-2 tablets by mouth every 6 (six) hours as needed for moderate pain. 10/20/13   Aviva Signs, MD  lisinopril (PRINIVIL,ZESTRIL) 10 MG tablet Take 10 mg by mouth daily. 08/30/13   Historical Provider, MD  loratadine (CLARITIN) 10 MG  tablet Take 1 tablet (10 mg total) by mouth daily. 08/04/15   Merryl Hacker, MD   BP 123/79 mmHg  Pulse 85  Temp(Src) 98.6 F (37 C) (Oral)  Resp 20  Ht '5\' 4"'$  (1.626 m)  Wt 193 lb (87.544 kg)  BMI 33.11 kg/m2  SpO2 99% Physical Exam  Constitutional: He is oriented to person, place, and time. He appears well-developed and well-nourished. No distress.  Nontoxic-appearing  HENT:  Head: Normocephalic and atraumatic.  Postnasal drip noted  Eyes: Pupils are equal, round, and reactive to light.  Neck: Neck supple.  Cardiovascular: Normal rate, regular rhythm and normal heart sounds.   No murmur heard. Pulmonary/Chest: Effort normal and breath sounds normal. No respiratory distress. He has no wheezes. He has no rales.  Abdominal: Soft. Bowel sounds are normal. There is no tenderness. There is no rebound.  Musculoskeletal:  No significant edema noted  Lymphadenopathy:    He has no cervical adenopathy.  Neurological: He is alert and oriented to person, place, and time.  Skin: Skin is warm and dry.  Psychiatric: He has a normal mood and affect.  Nursing note and vitals reviewed.   ED Course  Procedures (including critical care time) DIAGNOSTIC STUDIES: Oxygen Saturation is 99% on RA, normal by my interpretation.    COORDINATION OF CARE: 11:41 PM Discussed treatment plan with pt at bedside which includes CXR and pt agreed to plan.   Labs Review Labs Reviewed - No data to display  Imaging Review Dg Chest Port 1 View  08/04/2015  CLINICAL DATA:  Acute onset of dry cough.  Initial encounter. EXAM: PORTABLE CHEST 1 VIEW COMPARISON:  Chest radiograph performed 08/19/2010 FINDINGS: The lungs are hypoexpanded. Mild vascular crowding and vascular congestion are seen. No pleural effusion or pneumothorax is identified. The cardiomediastinal silhouette is mildly enlarged. No acute osseous abnormalities are seen. IMPRESSION: Lungs hypoexpanded. Mild vascular congestion and mild cardiomegaly  noted. No definite focal airspace consolidation seen. Electronically Signed   By: Garald Balding M.D.   On: 08/04/2015 23:45   I have personally reviewed and evaluated these images and lab results as part of my medical decision-making.   EKG Interpretation None      MDM   Final diagnoses:  Cough    Patient presents with cough, sore throat, and postnasal drip. Reports symptoms ongoing over the last several months but worsening over the last several days. Was seen here 2 days ago. He is currently finishing a course of antibiotics and was prescribed supportive care with nasal saline and Flonase 2 days ago. He is nontoxic. Vital signs reassuring. Afebrile. No respiratory distress. His chest x-ray does not show any evidence of pneumonia. He does have a mild bleeding enlarged cardiac silhouette and mild vascular congestion. No other signs or symptoms of heart failure. He did have an echo in 2015 which showed grade 1 diastolic failure. He is on HCTZ. Do not feel that this is likely contributing to his clinical picture  however, he does need follow-up with his primary physician and may need adjustment in his diuretic. Given the duration of his symptoms and how well-appearing the patient appears, there may be an allergic component. Will add Claritin and have him follow-up with his primary physician.  After history, exam, and medical workup I feel the patient has been appropriately medically screened and is safe for discharge home. Pertinent diagnoses were discussed with the patient. Patient was given return precautions.  I personally performed the services described in this documentation, which was scribed in my presence. The recorded information has been reviewed and is accurate.   Merryl Hacker, MD 08/05/15 0005

## 2015-08-04 NOTE — Discharge Instructions (Signed)
You were seen today for a cough. He has been seen and evaluated previously for the same. It appears that your symptoms have been ongoing.  In addition to cough, you also have a sore throat. Your symptoms may be related to allergies. Chest x-ray did not show a pneumonia. However, there was mild vascular congestion. You otherwise have no signs of heart failure. Doubt that this is contributing; however, you do need to follow-up with her primary physician for recheck. You may need adjustment in your HCTZ. Until that time, continue supportive care at home. In addition will be started on an antihistamine.  Cough, Adult Coughing is a reflex that clears your throat and your airways. Coughing helps to heal and protect your lungs. It is normal to cough occasionally, but a cough that happens with other symptoms or lasts a long time may be a sign of a condition that needs treatment. A cough may last only 2-3 weeks (acute), or it may last longer than 8 weeks (chronic). CAUSES Coughing is commonly caused by:  Breathing in substances that irritate your lungs.  A viral or bacterial respiratory infection.  Allergies.  Asthma.  Postnasal drip.  Smoking.  Acid backing up from the stomach into the esophagus (gastroesophageal reflux).  Certain medicines.  Chronic lung problems, including COPD (or rarely, lung cancer).  Other medical conditions such as heart failure. HOME CARE INSTRUCTIONS  Pay attention to any changes in your symptoms. Take these actions to help with your discomfort:  Take medicines only as told by your health care provider.  If you were prescribed an antibiotic medicine, take it as told by your health care provider. Do not stop taking the antibiotic even if you start to feel better.  Talk with your health care provider before you take a cough suppressant medicine.  Drink enough fluid to keep your urine clear or pale yellow.  If the air is dry, use a cold steam vaporizer or  humidifier in your bedroom or your home to help loosen secretions.  Avoid anything that causes you to cough at work or at home.  If your cough is worse at night, try sleeping in a semi-upright position.  Avoid cigarette smoke. If you smoke, quit smoking. If you need help quitting, ask your health care provider.  Avoid caffeine.  Avoid alcohol.  Rest as needed. SEEK MEDICAL CARE IF:   You have new symptoms.  You cough up pus.  Your cough does not get better after 2-3 weeks, or your cough gets worse.  You cannot control your cough with suppressant medicines and you are losing sleep.  You develop pain that is getting worse or pain that is not controlled with pain medicines.  You have a fever.  You have unexplained weight loss.  You have night sweats. SEEK IMMEDIATE MEDICAL CARE IF:  You cough up blood.  You have difficulty breathing.  Your heartbeat is very fast.   This information is not intended to replace advice given to you by your health care provider. Make sure you discuss any questions you have with your health care provider.   Document Released: 08/01/2010 Document Revised: 10/24/2014 Document Reviewed: 04/11/2014 Elsevier Interactive Patient Education [employer].

## 2015-08-05 NOTE — ED Notes (Signed)
Patient verbalizes understanding of discharge instructions, prescriptions, home care and follow up care. Patient out of department at this time. 

## 2015-08-07 DIAGNOSIS — J209 Acute bronchitis, unspecified: Secondary | ICD-10-CM | POA: Diagnosis not present

## 2015-08-07 DIAGNOSIS — I10 Essential (primary) hypertension: Secondary | ICD-10-CM | POA: Diagnosis not present

## 2015-08-07 DIAGNOSIS — E119 Type 2 diabetes mellitus without complications: Secondary | ICD-10-CM | POA: Diagnosis not present

## 2015-09-12 DIAGNOSIS — M545 Low back pain: Secondary | ICD-10-CM | POA: Diagnosis not present

## 2015-09-12 DIAGNOSIS — N182 Chronic kidney disease, stage 2 (mild): Secondary | ICD-10-CM | POA: Diagnosis not present

## 2015-09-12 DIAGNOSIS — I129 Hypertensive chronic kidney disease with stage 1 through stage 4 chronic kidney disease, or unspecified chronic kidney disease: Secondary | ICD-10-CM | POA: Diagnosis not present

## 2015-09-12 DIAGNOSIS — E1121 Type 2 diabetes mellitus with diabetic nephropathy: Secondary | ICD-10-CM | POA: Diagnosis not present

## 2015-11-13 ENCOUNTER — Other Ambulatory Visit: Payer: Self-pay | Admitting: Urology

## 2015-11-13 DIAGNOSIS — N200 Calculus of kidney: Secondary | ICD-10-CM

## 2015-12-13 ENCOUNTER — Ambulatory Visit (INDEPENDENT_AMBULATORY_CARE_PROVIDER_SITE_OTHER): Payer: Medicare Other | Admitting: Urology

## 2015-12-13 DIAGNOSIS — N401 Enlarged prostate with lower urinary tract symptoms: Secondary | ICD-10-CM | POA: Diagnosis not present

## 2015-12-13 DIAGNOSIS — E1121 Type 2 diabetes mellitus with diabetic nephropathy: Secondary | ICD-10-CM | POA: Diagnosis not present

## 2015-12-13 DIAGNOSIS — M545 Low back pain: Secondary | ICD-10-CM | POA: Diagnosis not present

## 2015-12-13 DIAGNOSIS — N182 Chronic kidney disease, stage 2 (mild): Secondary | ICD-10-CM | POA: Diagnosis not present

## 2015-12-13 DIAGNOSIS — N200 Calculus of kidney: Secondary | ICD-10-CM

## 2015-12-13 DIAGNOSIS — Z23 Encounter for immunization: Secondary | ICD-10-CM | POA: Diagnosis not present

## 2015-12-13 DIAGNOSIS — R351 Nocturia: Secondary | ICD-10-CM

## 2015-12-13 DIAGNOSIS — K219 Gastro-esophageal reflux disease without esophagitis: Secondary | ICD-10-CM | POA: Diagnosis not present

## 2016-01-21 DIAGNOSIS — E1165 Type 2 diabetes mellitus with hyperglycemia: Secondary | ICD-10-CM | POA: Diagnosis not present

## 2016-01-21 DIAGNOSIS — I10 Essential (primary) hypertension: Secondary | ICD-10-CM | POA: Diagnosis not present

## 2016-01-21 DIAGNOSIS — K1379 Other lesions of oral mucosa: Secondary | ICD-10-CM | POA: Diagnosis not present

## 2016-02-27 ENCOUNTER — Emergency Department (HOSPITAL_COMMUNITY)
Admission: EM | Admit: 2016-02-27 | Discharge: 2016-02-28 | Disposition: A | Discharge status: Home / Self Care | Payer: Medicare Other | Attending: Emergency Medicine | Admitting: Emergency Medicine

## 2016-02-27 ENCOUNTER — Encounter (HOSPITAL_COMMUNITY): Payer: Self-pay | Admitting: Emergency Medicine

## 2016-02-27 DIAGNOSIS — M25572 Pain in left ankle and joints of left foot: Secondary | ICD-10-CM | POA: Diagnosis not present

## 2016-02-27 DIAGNOSIS — R748 Abnormal levels of other serum enzymes: Secondary | ICD-10-CM | POA: Diagnosis not present

## 2016-02-27 DIAGNOSIS — M79671 Pain in right foot: Secondary | ICD-10-CM | POA: Diagnosis not present

## 2016-02-27 DIAGNOSIS — F319 Bipolar disorder, unspecified: Secondary | ICD-10-CM | POA: Diagnosis not present

## 2016-02-27 DIAGNOSIS — M25571 Pain in right ankle and joints of right foot: Secondary | ICD-10-CM | POA: Diagnosis present

## 2016-02-27 DIAGNOSIS — R600 Localized edema: Secondary | ICD-10-CM | POA: Insufficient documentation

## 2016-02-27 DIAGNOSIS — I10 Essential (primary) hypertension: Secondary | ICD-10-CM | POA: Diagnosis not present

## 2016-02-27 DIAGNOSIS — Z79899 Other long term (current) drug therapy: Secondary | ICD-10-CM | POA: Diagnosis not present

## 2016-02-27 DIAGNOSIS — M7989 Other specified soft tissue disorders: Secondary | ICD-10-CM | POA: Diagnosis not present

## 2016-02-27 DIAGNOSIS — F3111 Bipolar disorder, current episode manic without psychotic features, mild: Secondary | ICD-10-CM | POA: Insufficient documentation

## 2016-02-27 DIAGNOSIS — R609 Edema, unspecified: Secondary | ICD-10-CM

## 2016-02-27 DIAGNOSIS — M79672 Pain in left foot: Secondary | ICD-10-CM | POA: Diagnosis not present

## 2016-02-27 NOTE — ED Provider Notes (Signed)
By signing my name below, I, Macon Large, attest that this documentation has been prepared under the direction and in the presence of [place], DO. Electronically Signed: Macon Large, ED Scribe. 02/27/16. 12:41 AM.   TIME SEEN: 12:28 AM  CHIEF COMPLAINT: bilateral ankle swelling  HPI: HPI Comments: [name] is a 66 y.o. male with PMHx of bipolar disorder, DM, HTN, who presents to the Emergency Department complaining of moderate, constant, bilateral ankle swelling onset yesterday. Pt reports associated mild pain. He notes his pain is worsened with ambulation and bearing weight. No alleviating factors noted. Per nurse note, pt states he walked 2 hours in order to get to [place] because he did not have a ride and states that because of that his swelling is worse. He denies any injury to his legs. Denies any history of DVT. Denies chest pain or shortness of breath. No fever.  He states he has had an increase with stress since 12/2015. Pt denies taking medication for his bipolar disorder. Pt states his daughter, [name], wants to admit him to a mental institute but he does not think that this would be helpful. Has had psychiatric admissions before. He denies SI, HI or hallucinations. Denies drug or alcohol use. He states that he thinks his daughter may be out for his money and that is why she is trying to send him away.   ROS: See HPI Constitutional: no fever  Eyes: no drainage  ENT: no runny nose   Cardiovascular:  no chest pain  Resp: no SOB  GI: no vomiting GU: no dysuria Integumentary: no rash  Allergy: no hives  Musculoskeletal: bilateral ankle swelling and arthralgia  Neurological: no slurred speech ROS otherwise negative  PAST MEDICAL HISTORY/PAST SURGICAL HISTORY:  Past Medical History:  Diagnosis Date  . Arthritis   . Bipolar disorder ([place])   . Diabetes mellitus without complication ([place])    boderline  . Heart murmur 1995  . Hypertension   .  Kidney stones     MEDICATIONS:  Prior to Admission medications   Medication Sig Start Date End Date Taking? Authorizing Provider  acetaminophen (TYLENOL) 325 MG tablet Take 650 mg by mouth every 6 (six) hours as needed for mild pain or moderate pain.    Historical Provider, MD  amLODipine (NORVASC) 5 MG tablet Take 5 mg by mouth daily. 08/30/13   Historical Provider, MD  cefUROXime (CEFTIN) 250 MG tablet Take 250 mg by mouth 2 (two) times daily. 7 day course starting on 07/31/2015 07/31/15   Historical Provider, MD  cholecalciferol (VITAMIN D) 1000 units tablet Take 1,000 Units by mouth daily.    Historical Provider, MD  hydrochlorothiazide (HYDRODIURIL) 25 MG tablet Take 25 mg by mouth daily. 07/19/13   Historical Provider, MD  HYDROcodone-acetaminophen (NORCO/VICODIN) 5-325 MG per tablet Take 1-2 tablets by mouth every 6 (six) hours as needed for moderate pain. 10/20/13   Aviva Signs, MD  lidocaine (XYLOCAINE) 2 % solution Use as directed 20 mLs in the mouth or throat 3 (three) times daily.  08/03/15   Historical Provider, MD  lisinopril (PRINIVIL,ZESTRIL) 10 MG tablet Take 10 mg by mouth daily. 08/30/13   Historical Provider, MD  loratadine (CLARITIN) 10 MG tablet Take 1 tablet (10 mg total) by mouth daily. 08/04/15   Merryl Hacker, MD  omeprazole (PRILOSEC) 20 MG capsule Take 20 mg by mouth daily. 09/13/13   Historical Provider, MD  potassium chloride SA (K-DUR,KLOR-CON) 20 MEQ tablet Take 20 mEq  by mouth daily. 09/13/13   Historical Provider, MD  pravastatin (PRAVACHOL) 20 MG tablet Take 20 mg by mouth at bedtime. 05/07/15   Historical Provider, MD  vitamin C (ASCORBIC ACID) 500 MG tablet Take 500 mg by mouth daily.    Historical Provider, MD    ALLERGIES:  Allergies  Allergen Reactions  . Codeine Nausea And Vomiting  . Morphine And Related Nausea And Vomiting    SOCIAL HISTORY:  Social History  Substance Use Topics  . Smoking status: Never Smoker  . Smokeless tobacco: Never Used  .  Alcohol use No    FAMILY HISTORY: History reviewed. No pertinent family history.  EXAM: BP 162/92 (BP Location: Left Arm)   Pulse 110   Temp 98.5 F (36.9 C) (Oral)   Resp 20   Ht '5\' 4"'$  (1.626 m)   Wt 220 lb (99.8 kg)   SpO2 99%   BMI 37.76 kg/m  CONSTITUTIONAL: Alert and oriented and responds appropriately to questions. Well-appearing; well-nourished, Chronically ill-appearing, afebrile, no distress HEAD: Normocephalic EYES: Conjunctivae clear, PERRL, EOMI ENT: normal nose; no rhinorrhea; moist mucous membranes NECK: Supple, no meningismus, no nuchal rigidity, no LAD; no JVD CARD: RRR; S1 and S2 appreciated; no murmurs, no clicks, no rubs, no gallops RESP: Normal chest excursion without splinting or tachypnea; breath sounds clear and equal bilaterally; no wheezes, no rhonchi, no rales, no hypoxia or respiratory distress, speaking full sentences ABD/GI: Normal bowel sounds; non-distended; soft, non-tender, no rebound, no guarding, no peritoneal signs, no hepatosplenomegaly BACK:  The back appears normal and is non-tender to palpation, there is no CVA tenderness EXT: Normal ROM in all joints; non-tender to palpation; bilateral lower extremity edema worse than the left foot and ankle compared to the right without bony deformity or bony tenderness. Very mild erythema noted to these areas but no warmth, fluctuance or induration. 2+ DP pulses bilaterally.  Normal capillary refill; no cyanosis, no calf tenderness or swelling    SKIN: Normal color for age and race; warm; no rash NEURO: Moves all extremities equally, sensation to light touch intact diffusely, cranial nerves II through XII intact, normal speech PSYCH: Rapid speech, some tangential thought process. Denies SI, HI or hallucinations. Able to be redirected.  MEDICAL DECISION MAKING: Patient here with complaints of bilateral leg swelling. No chest pain or short of breath. No other sign of volume overload. I think this is likely from  walking so much as he states he has been walking place to place to avoid his daughter finding him so that she cannot commit him. He does appear to be manic today but does not have any obvious signs of psychosis and no SI or HI. I will consult TTS further reevaluation to see if patient would benefit from inpatient treatment. Will check labs today including CK level.  ED PROGRESS: Patient's CK level is elevated. Will give IV fluids. Likely because of walking so my. Creatinine is 1.24 which is at his baseline. Urine shows no hemoglobin.   3:15 AM  D/w Beverely Low with [facility].  He has evaluated patient and discuss with physician extender. They do not feel patient needs psychiatric inpatient criteria recommend discharge with outpatient resources. Patient reports he has been off of medications for 2 years and refuses to go back on them.   Patient refusing second liter of IV fluids. Have attempted to explain to him at length why feel he needs it because of his elevated CK level. He still adamantly refuses. Patient requesting x-rays of his  feet and ankles to rule out fracture. Will obtain x-rays and further monitor patient.   X-ray show no acute abnormality. Patient still refusing second liter of IV fluids. Repeat CK level has improved and again there is no sign of myoglobinuria or kidney failure. Since he will not allow [place] to hydrate him further do not feel there is anything else we can do for him from the emergency department at this time. Have recommended he have his CK level rechecked with his PCP Dr. Luan Pulling this week and increase his fluid intake. Discussed what to look out for including muscle cramps and pains, tea-colored urine that he should return to the hospital immediately. Have provided him with a copy of these results to take to his PCP's office.  Recommended he stay off his feet for the next few days if possible.  Recommended he return at 2:30 pm today to the ED to have an [place] of his LLE to rule out DVT.   Again, no other signs of volume overload.  No CP or SOB.  Have advised him to keep his legs elevated at rest and wear compression stockings to help with swelling and discomfort.  At this time, I do not feel there is any life-threatening condition present. I have reviewed and discussed all results (EKG, imaging, lab, urine as appropriate) and exam findings with patient/family. I have reviewed nursing notes and appropriate previous records.  I feel the patient is safe to be discharged home without further emergent workup and can continue workup as an outpatient as needed. Discussed usual and customary return precautions. Patient/family verbalize understanding and are comfortable with this plan.  Outpatient follow-up has been provided. All questions have been answered.  I personally performed the services described in this documentation, which was scribed in my presence. The recorded information has been reviewed and is accurate.     [place], DO 02/28/16 [phone]

## 2016-02-27 NOTE — ED Notes (Signed)
Pt states he walked to hours to [employer].  Pts jeans are wet to his knee caps. Pt states he does not want to change into a gown. Pt only wearing socks and carrying his shoes in his hand.

## 2016-02-27 NOTE — ED Triage Notes (Signed)
Pt states that he started getting more and more stressed in November and feels like his family doesn't love him any more.  Pt rambling constantly in triage.  Has a hx of bipolar and has not been taking medication

## 2016-02-28 ENCOUNTER — Ambulatory Visit (HOSPITAL_COMMUNITY): Admit: 2016-02-28 | Payer: Medicare Other

## 2016-02-28 ENCOUNTER — Emergency Department (HOSPITAL_COMMUNITY): Payer: Medicare Other

## 2016-02-28 DIAGNOSIS — M25572 Pain in left ankle and joints of left foot: Secondary | ICD-10-CM | POA: Diagnosis not present

## 2016-02-28 DIAGNOSIS — M25571 Pain in right ankle and joints of right foot: Secondary | ICD-10-CM | POA: Diagnosis not present

## 2016-02-28 DIAGNOSIS — M79672 Pain in left foot: Secondary | ICD-10-CM | POA: Diagnosis not present

## 2016-02-28 DIAGNOSIS — M79606 Pain in leg, unspecified: Secondary | ICD-10-CM | POA: Diagnosis not present

## 2016-02-28 DIAGNOSIS — F3111 Bipolar disorder, current episode manic without psychotic features, mild: Secondary | ICD-10-CM | POA: Diagnosis not present

## 2016-02-28 DIAGNOSIS — M7989 Other specified soft tissue disorders: Secondary | ICD-10-CM | POA: Diagnosis not present

## 2016-02-28 DIAGNOSIS — M79671 Pain in right foot: Secondary | ICD-10-CM | POA: Diagnosis not present

## 2016-02-28 LAB — COMPREHENSIVE METABOLIC PANEL
ALT: 37 U/L (ref 17–63)
AST: 36 U/L (ref 15–41)
Albumin: 3.6 g/dL (ref 3.5–5.0)
Alkaline Phosphatase: 94 U/L (ref 38–126)
Anion gap: 8 (ref 5–15)
BUN: 21 mg/dL — ABNORMAL HIGH (ref 6–20)
CO2: 26 mmol/L (ref 22–32)
Calcium: 8.8 mg/dL — ABNORMAL LOW (ref 8.9–10.3)
Chloride: 101 mmol/L (ref 101–111)
Creatinine, Ser: 1.24 mg/dL (ref 0.61–1.24)
GFR calc Af Amer: >60 mL/min (ref >60)
GFR calc non Af Amer: 59 mL/min — ABNORMAL LOW (ref >60)
GLUCOSE: 159 mg/dL — AB (ref 65–99)
Potassium: 3.2 mmol/L — ABNORMAL LOW (ref 3.5–5.1)
Sodium: 135 mmol/L (ref 135–145)
Total Bilirubin: 0.6 mg/dL (ref 0.3–1.2)
Total Protein: 6.7 g/dL (ref 6.5–8.1)

## 2016-02-28 LAB — URINALYSIS, ROUTINE W REFLEX MICROSCOPIC
Bacteria, UA: NONE SEEN
Bilirubin Urine: NEGATIVE
GLUCOSE, UA: 50 mg/dL — AB
HGB URINE DIPSTICK: NEGATIVE
Ketones, ur: NEGATIVE mg/dL
Leukocytes, UA: NEGATIVE
Nitrite: NEGATIVE
PH: 6 (ref 5.0–8.0)
Protein, ur: 30 mg/dL — AB
SPECIFIC GRAVITY, URINE: 1.016 (ref 1.005–1.030)

## 2016-02-28 LAB — CBC WITH DIFFERENTIAL/PLATELET
Basophils Absolute: 0 10*3/uL (ref 0.0–0.1)
Basophils Relative: 0 %
EOS ABS: 0.1 10*3/uL (ref 0.0–0.7)
Eosinophils Relative: 1 %
HCT: 43.4 % (ref 39.0–52.0)
Hemoglobin: 14.9 g/dL (ref 13.0–17.0)
LYMPHS ABS: 2.1 10*3/uL (ref 0.7–4.0)
Lymphocytes Relative: 19 %
MCH: 28.9 pg (ref 26.0–34.0)
MCHC: 34.3 g/dL (ref 30.0–36.0)
MCV: 84.1 fL (ref 78.0–100.0)
MONO ABS: 0.8 10*3/uL (ref 0.1–1.0)
MONOS PCT: 7 %
NEUTROS PCT: 73 %
Neutro Abs: 8.3 10*3/uL — ABNORMAL HIGH (ref 1.7–7.7)
Platelets: 168 10*3/uL (ref 150–400)
RBC: 5.16 MIL/uL (ref 4.22–5.81)
RDW: 14.3 % (ref 11.5–15.5)
WBC: 11.4 10*3/uL — ABNORMAL HIGH (ref 4.0–10.5)

## 2016-02-28 LAB — RAPID URINE DRUG SCREEN, HOSP PERFORMED
Amphetamines: NOT DETECTED
BARBITURATES: NOT DETECTED
Benzodiazepines: NOT DETECTED
Cocaine: NOT DETECTED
Opiates: NOT DETECTED
Tetrahydrocannabinol: NOT DETECTED

## 2016-02-28 LAB — ETHANOL: Alcohol, Ethyl (B): <5 mg/dL (ref <5)

## 2016-02-28 LAB — CK
CK TOTAL: 481 U/L — AB (ref 49–397)
Total CK: 521 U/L — ABNORMAL HIGH (ref 49–397)

## 2016-02-28 LAB — SALICYLATE LEVEL: Salicylate Lvl: <7 mg/dL (ref 2.8–30.0)

## 2016-02-28 LAB — ACETAMINOPHEN LEVEL

## 2016-02-28 MED ORDER — LISINOPRIL 10 MG PO TABS: 10.0000 mg | ORAL_TABLET | Freq: Every day | ORAL | Status: DC

## 2016-02-28 MED ORDER — PRAVASTATIN SODIUM 20 MG PO TABS: 20.0000 mg | ORAL_TABLET | Freq: Every day | ORAL | Status: DC

## 2016-02-28 MED ORDER — VITAMIN C 500 MG PO TABS: 500.0000 mg | ORAL_TABLET | Freq: Every day | ORAL | Status: DC

## 2016-02-28 MED ORDER — VITAMIN D3 25 MCG (1000 UNIT) PO TABS: 1000.0000 [IU] | ORAL_TABLET | Freq: Every day | ORAL | Status: DC

## 2016-02-28 MED ORDER — POTASSIUM CHLORIDE CRYS ER 20 MEQ PO TBCR: 20.0000 meq | EXTENDED_RELEASE_TABLET | Freq: Every day | ORAL | Status: DC

## 2016-02-28 MED ORDER — AMLODIPINE BESYLATE 5 MG PO TABS: 5.0000 mg | ORAL_TABLET | Freq: Every day | ORAL | Status: DC

## 2016-02-28 MED ORDER — HYDROCHLOROTHIAZIDE 25 MG PO TABS: 25.0000 mg | ORAL_TABLET | Freq: Every day | ORAL | Status: DC

## 2016-02-28 MED ORDER — PANTOPRAZOLE SODIUM 40 MG PO TBEC: 40.0000 mg | DELAYED_RELEASE_TABLET | Freq: Every day | ORAL | Status: DC

## 2016-02-28 MED ORDER — SODIUM CHLORIDE 0.9 % IV BOLUS (SEPSIS): 1000.0000 mL | Freq: Once | INTRAVENOUS | Status: DC

## 2016-02-28 MED ORDER — SODIUM CHLORIDE 0.9 % IV BOLUS (SEPSIS)
1000.0000 mL | Freq: Once | INTRAVENOUS | Status: AC
  Administered 2016-02-28: 1000 mL via INTRAVENOUS

## 2016-02-28 NOTE — ED Notes (Signed)
Patient transported to X-ray 

## 2016-02-28 NOTE — Discharge Instructions (Addendum)
Please return to the ED later today at 2:30 pm for ultrasound of your left leg to rule out a blood clot.  You may keep your legs elevated at rest, use compression stockings. Please increase your water intake to help clear muscle breakdown and prevent renal failure.  Please follow-up with Dr. Luan Pulling this week.

## 2016-02-28 NOTE — BH Assessment (Addendum)
Tele Assessment Note   [name] is an 66 y.o. male.  -Clinician reviewed note b Dr. Leonides Schanz.   He states he has had an increase with stress since 12/2015. Pt denies taking medication for his bipolar disorder. Pt states his daughter, [name], wants to admit him to a mental institute but he refuses to. He states he feels scared and "felling manic". Denies CP, SOB, fever, chills, nausea, vomiting HI, SI, hallucinations. He also denies illicit drug use. Pt is a non-smoker.   Patient came to [facility] on foot because he had swollen ankles.  Patient said that he had talked to his daughter 2-3 weeks ago and she had said that she was going to have him committed.  Patient said that he did not want that to happen so he has been "on the move from one place to the other."  Patient does have a primary residence but will stay out as much as possible.  Patient said that he has been inpatient at three facilities over 20 years ago and does not want to go back to any inpatient care.    Patient denies any SI, no plan, no intention.  Patient says, "I love life too much, I don't want to end it."  Patient denies any HI or A/V hallucinations.  Patient says that "I know I'm mentally ill, but I don't have bipolar."  He said that he has been off any psychiatric medication for the last two years.  He used to be seen by Dr. [name] but has not in a little over 3 years.  Patient does not want to go back on psychiatric meds.  Patient denies any use of ETOH or illicit drugs.  Patient does talk rapidly, says he gets little sleep.  His conversation is goal directed.  He laughs appropriately and is able to communicate his desires well.    -Clinician discussed patient care with [name], FNP.  He recommended outpatient resources and pt be discharged.  Clinician talked with Dr. Leonides Schanz and she agreed with this disposition.  Patient to be discharged.  Diagnosis: MDD recurrent   Past Medical History:  Past Medical History:  Diagnosis Date   . Arthritis   . Bipolar disorder ([place])   . Diabetes mellitus without complication ([place])    boderline  . Heart murmur 1995  . Hypertension   . Kidney stones     Past Surgical History:  Procedure Laterality Date  . APPENDECTOMY    . CHOLECYSTECTOMY N/A 10/20/2013   Procedure: LAPAROSCOPIC CHOLECYSTECTOMY;  Surgeon: [name], MD;  Location: [facility];  Service: General;  Laterality: N/A;  . HIP SURGERY    . KNEE SURGERY      Family History: History reviewed. No pertinent family history.  Social History:  reports that he has never smoked. He has never used smokeless tobacco. He reports that he does not drink alcohol or use drugs.  Additional Social History:  Alcohol / Drug Use Pain Medications: Pt not on any medication. Prescriptions: Pt not on any medication Over the Counter: See PTA medication list History of alcohol / drug use?: No history of alcohol / drug abuse  CIWA: CIWA-Ar BP: 162/92 Pulse Rate: 110 COWS:    PATIENT STRENGTHS: (choose at least two) Ability for insight Average or above average intelligence Communication skills  Allergies:  Allergies  Allergen Reactions  . Codeine Nausea And Vomiting  . Morphine And Related Nausea And Vomiting    Home Medications:  (Not in a hospital  admission)  OB/GYN Status:  No LMP for male patient.  General Assessment Data Location of Assessment: [facility] TTS Assessment: In system Is this a Tele or Face-to-Face Assessment?: Tele Assessment Is this an Initial Assessment or a Re-assessment for this encounter?: Initial Assessment Marital status: Single Is patient pregnant?: No Pregnancy Status: No Living Arrangements: Alone Can pt return to current living arrangement?: Yes Admission Status: Voluntary Is patient capable of signing voluntary admission?: Yes Referral Source: Self/Family/Friend Insurance type: [facility]     Crisis Care Plan Living Arrangements: Alone Name of Psychiatrist: None Name of Therapist:  None  Education Status Is patient currently in school?: No Highest grade of school patient has completed: Some college  Risk to self with the past 6 months Suicidal Ideation: No Has patient been a risk to self within the past 6 months prior to admission? : No Suicidal Intent: No Has patient had any suicidal intent within the past 6 months prior to admission? : No Is patient at risk for suicide?: No Suicidal Plan?: No Has patient had any suicidal plan within the past 6 months prior to admission? : No Access to Means: No What has been your use of drugs/alcohol within the last 12 months?: Pt denies Previous Attempts/Gestures: No How many times?: 0 Other Self Harm Risks: None Triggers for Past Attempts: None known Intentional Self Injurious Behavior: None Family Suicide History: No Recent stressful life event(s): Financial Problems, Conflict (Comment) (Conflict with daughter ) Persecutory voices/beliefs?: No Depression: No Depression Symptoms:  (Pt denies depressive symptoms.) Substance abuse history and/or treatment for substance abuse?: No Suicide prevention information given to non-admitted patients: Not applicable  Risk to Others within the past 6 months Homicidal Ideation: No Does patient have any lifetime risk of violence toward others beyond the six months prior to admission? : No Thoughts of Harm to Others: No Current Homicidal Intent: No Current Homicidal Plan: No Access to Homicidal Means: No Identified Victim: No one History of harm to others?: No Assessment of Violence: None Noted Violent Behavior Description: Pt denies Does patient have access to weapons?: No Criminal Charges Pending?: No Does patient have a court date: No Is patient on probation?: No  Psychosis Hallucinations: None noted Delusions: None noted  Mental Status Report Appearance/Hygiene: Disheveled Eye Contact: Fair Motor Activity: Freedom of movement, Unremarkable Speech:  Logical/coherent Level of Consciousness: Alert Mood: Anxious Affect: Apprehensive, Anxious Anxiety Level: Minimal Thought Processes: Coherent, Relevant Judgement: Unimpaired Orientation: Person, Time, Place, Situation Obsessive Compulsive Thoughts/Behaviors: None  Cognitive Functioning Concentration: Decreased Memory: Recent Impaired, Remote Intact IQ: Average Insight: Poor Impulse Control: Fair Appetite: Fair Weight Loss: 0 Weight Gain: 0 Sleep: No Change Total Hours of Sleep:  (<4H/D) Vegetative Symptoms: None  ADLScreening [facility] Assessment Services) Patient's cognitive ability adequate to safely complete daily activities?: Yes Patient able to express need for assistance with ADLs?: Yes Independently performs ADLs?: Yes (appropriate for developmental age)  Prior Inpatient Therapy Prior Inpatient Therapy: Yes Prior Therapy Dates: Over 20 years ago Prior Therapy Facilty/Provider(s): [facility] Reason for Treatment: depression  Prior Outpatient Therapy Prior Outpatient Therapy: Yes Prior Therapy Dates: Over 2 years ago Prior Therapy Facilty/Provider(s): Dr. [name] Reason for Treatment: med management Does patient have an ACCT team?: No Does patient have Intensive In-House Services?  : No Does patient have [facility] services? : No Does patient have P4CC services?: No  ADL Screening (condition at time of admission) Patient's cognitive ability adequate to safely complete daily activities?: Yes Is the patient deaf or have difficulty hearing?:  Yes (A little bit of problems) Does the patient have difficulty seeing, even when wearing glasses/contacts?: No Does the patient have difficulty concentrating, remembering, or making decisions?: No Patient able to express need for assistance with ADLs?: Yes Does the patient have difficulty dressing or bathing?: No Independently performs ADLs?: Yes (appropriate for developmental age) Does the patient have difficulty walking or climbing  stairs?: No Weakness of Legs: None Weakness of Arms/Hands: None       Abuse/Neglect Assessment (Assessment to be complete while patient is alone) Physical Abuse: Denies Verbal Abuse: Denies Sexual Abuse: Denies Exploitation of patient/patient's resources: Denies Self-Neglect: Denies     Regulatory affairs officer (For Healthcare) Does Patient Have a Medical Advance Directive?: No Type of Advance Directive: Press photographer    Additional Information 1:1 In Past 12 Months?: No CIRT Risk: No Elopement Risk: No Does patient have medical clearance?: Yes     Disposition:  Disposition Initial Assessment Completed for this Encounter: Yes Disposition of Patient: Referred to (Pt to be reviewed by PA) Patient referred to:  (Pt to be reviewed by PA)  Curlene Dolphin Ray 02/28/2016 1:51 AM

## 2016-02-28 NOTE — ED Notes (Signed)
Pt verbalized understanding of discharge instructions. Pt ambulatory to waiting room.  

## 2016-02-28 NOTE — ED Notes (Signed)
Pt talking with [place].

## 2016-02-28 NOTE — ED Notes (Signed)
Pt refused 2nd bag of normal saline.

## 2016-02-29 DIAGNOSIS — R609 Edema, unspecified: Secondary | ICD-10-CM | POA: Diagnosis not present

## 2016-02-29 DIAGNOSIS — M7989 Other specified soft tissue disorders: Secondary | ICD-10-CM | POA: Diagnosis not present

## 2016-03-03 DIAGNOSIS — I10 Essential (primary) hypertension: Secondary | ICD-10-CM | POA: Diagnosis not present

## 2016-03-03 DIAGNOSIS — E1121 Type 2 diabetes mellitus with diabetic nephropathy: Secondary | ICD-10-CM | POA: Diagnosis not present

## 2016-03-03 DIAGNOSIS — N182 Chronic kidney disease, stage 2 (mild): Secondary | ICD-10-CM | POA: Diagnosis not present

## 2016-03-03 DIAGNOSIS — R609 Edema, unspecified: Secondary | ICD-10-CM | POA: Diagnosis not present

## 2016-03-04 ENCOUNTER — Emergency Department (HOSPITAL_COMMUNITY)
Admission: EM | Admit: 2016-03-04 | Discharge: 2016-03-04 | Discharge status: Against Medical Advice | Payer: Medicare Other | Source: Home / Self Care | Attending: Emergency Medicine | Admitting: Emergency Medicine

## 2016-03-04 ENCOUNTER — Encounter (HOSPITAL_COMMUNITY): Payer: Self-pay | Admitting: Emergency Medicine

## 2016-03-04 DIAGNOSIS — I10 Essential (primary) hypertension: Secondary | ICD-10-CM | POA: Insufficient documentation

## 2016-03-04 DIAGNOSIS — F419 Anxiety disorder, unspecified: Secondary | ICD-10-CM

## 2016-03-04 DIAGNOSIS — Z79899 Other long term (current) drug therapy: Secondary | ICD-10-CM

## 2016-03-04 DIAGNOSIS — E119 Type 2 diabetes mellitus without complications: Secondary | ICD-10-CM

## 2016-03-04 DIAGNOSIS — R05 Cough: Secondary | ICD-10-CM

## 2016-03-04 DIAGNOSIS — R059 Cough, unspecified: Secondary | ICD-10-CM

## 2016-03-04 NOTE — ED Triage Notes (Signed)
Cough non-productive for 6-8 weeks.  Pt says he can't get any sleep and his daughter is out to get him.  Pt is anxious and slight agitated.  Pt is more interested in discussing his person life.  Pt was here yesterday for ankle but was evaluated for psych issues.

## 2016-03-04 NOTE — ED Provider Notes (Signed)
[facility] DEPT Provider Note   CSN: [id number] Arrival date & time: 03/04/16  1035     History   Chief Complaint Chief Complaint  Patient presents with  . Cough    HPI [name] is a 66 y.o. male.  HPI Pt was seen at 1045. Per pt, c/o gradual onset and persistence of constant cough for the past 6 to 8 weeks. Pt also states he "can't get any sleep" because he is anxious "about my daughter wanting to me in a mental institution." Pt states "all they're good for is taking your money." States "she just wants to ruin me." Denies SI, no HI, no hallucinations. Denies CP/SOB, no abd pain, no N/V/D, no fevers.   Past Medical History:  Diagnosis Date  . Arthritis   . Bipolar disorder ([place])   . Diabetes mellitus without complication ([place])    boderline  . Heart murmur 1995  . Hypertension   . Kidney stones     There are no active problems to display for this patient.   Past Surgical History:  Procedure Laterality Date  . APPENDECTOMY    . CHOLECYSTECTOMY N/A 10/20/2013   Procedure: LAPAROSCOPIC CHOLECYSTECTOMY;  Surgeon: [name], MD;  Location: [facility];  Service: General;  Laterality: N/A;  . HIP SURGERY    . KNEE SURGERY         Home Medications    Prior to Admission medications   Medication Sig Start Date End Date Taking? Authorizing Provider  amLODipine (NORVASC) 5 MG tablet Take 5 mg by mouth daily. 08/30/13   Historical Provider, MD  cholecalciferol (VITAMIN D) 1000 units tablet Take 1,000 Units by mouth daily.    Historical Provider, MD  hydrochlorothiazide (HYDRODIURIL) 25 MG tablet Take 25 mg by mouth daily. 07/19/13   Historical Provider, MD  lisinopril (PRINIVIL,ZESTRIL) 10 MG tablet Take 10 mg by mouth daily. 08/30/13   Historical Provider, MD  omeprazole (PRILOSEC) 20 MG capsule Take 20 mg by mouth daily. 09/13/13   Historical Provider, MD  potassium chloride SA (K-DUR,KLOR-CON) 20 MEQ tablet Take 20 mEq by mouth daily. 09/13/13   Historical Provider, MD   pravastatin (PRAVACHOL) 20 MG tablet Take 20 mg by mouth at bedtime. 05/07/15   Historical Provider, MD  vitamin C (ASCORBIC ACID) 500 MG tablet Take 500 mg by mouth daily.    Historical Provider, MD    Family History History reviewed. No pertinent family history.  Social History Social History  Substance Use Topics  . Smoking status: Never Smoker  . Smokeless tobacco: Never Used  . Alcohol use No     Allergies   Codeine and Morphine and related   Review of Systems Review of Systems ROS: Statement: All systems negative except as marked or noted in the HPI; Constitutional: Negative for fever and chills. ; ; Eyes: Negative for eye pain, redness and discharge. ; ; ENMT: Negative for ear pain, hoarseness, nasal congestion, sinus pressure and sore throat. ; ; Cardiovascular: Negative for chest pain, palpitations, diaphoresis, dyspnea and peripheral edema. ; ; Respiratory: +cough. Negative for wheezing and stridor. ; ; Gastrointestinal: Negative for nausea, vomiting, diarrhea, abdominal pain, blood in stool, hematemesis, jaundice and rectal bleeding. . ; ; Genitourinary: Negative for dysuria, flank pain and hematuria. ; ; Musculoskeletal: Negative for back pain and neck pain. Negative for swelling and trauma.; ; Skin: Negative for pruritus, rash, abrasions, blisters, bruising and skin lesion.; ; Neuro: Negative for headache, lightheadedness and neck stiffness. Negative for weakness, altered  level of consciousness, altered mental status, extremity weakness, paresthesias, involuntary movement, seizure and syncope.; Psych:  No SI, no SA, no HI, no hallucinations.      Physical Exam Updated Vital Signs BP (!) 166/132 (BP Location: Right Arm)   Pulse 95   Temp 97.9 F (36.6 C) (Oral)   Resp 20   Ht '5\' 4"'$  (1.626 m)   Wt 220 lb (99.8 kg)   BMI 37.76 kg/m   Physical Exam 1050: Physical examination:  Nursing notes reviewed; Vital signs and O2 SAT reviewed;  Constitutional: Well developed,  Well nourished, Well hydrated, In no acute distress; Head:  Normocephalic, atraumatic; Eyes: EOMI, PERRL, No scleral icterus; ENMT: Mouth and pharynx normal, Mucous membranes moist; Neck: Supple, Full range of motion; Cardiovascular: Regular rate and rhythm; Respiratory:  Speaking full sentences with ease, Normal respiratory effort/excursion; Chest: No deformity, Movement normal; Abdomen: Nondistended; Extremities: No deformity.; Neuro: AA&Ox3, rambling historian. Major CN grossly intact.  Speech clear. No gross focal motor deficits in extremities. Climbs on and off stretcher easily by himself. Gait steady.; Skin: Color normal, Warm, Dry.; Psych: Easily agitated.    ED Treatments / Results  Labs (all labs ordered are listed, but only abnormal results are displayed)   EKG  EKG Interpretation None       Radiology   Procedures Procedures (including critical care time)  Medications Ordered in ED Medications - No data to display   Initial Impression / Assessment and Plan / ED Course  I have reviewed the triage vital signs and the nursing notes.  Pertinent labs & imaging results that were available during my care of the patient were reviewed by me and considered in my medical decision making (see chart for details).  MDM Reviewed: previous chart, nursing note and vitals Reviewed previous: labs    1055:  Pt presented to ED 02/27/16 for similar concerns of "stress" related to his daughter and "being placed in a mental institution." Pt was evaluated by TTS and recommended d/c with outpatient resources. I voiced my concerns to pt that he may need another evaluation by TTS regarding his anxiety. Pt stated again he "wasn't going to a mental institution" and walked out of the ED.     Final Clinical Impressions(s) / ED Diagnoses   Final diagnoses:  None    New Prescriptions New Prescriptions   No medications on file     Francine Graven, DO 03/08/16 1728

## 2016-03-05 ENCOUNTER — Emergency Department (HOSPITAL_COMMUNITY): Payer: Medicare Other

## 2016-03-05 ENCOUNTER — Inpatient Hospital Stay (HOSPITAL_COMMUNITY)
Admission: EM | Admit: 2016-03-05 | Discharge: 2016-03-07 | DRG: 602 | Disposition: A | Discharge status: Other Acute Inpatient Hospital | Payer: Medicare Other | Attending: Pulmonary Disease | Admitting: Pulmonary Disease

## 2016-03-05 ENCOUNTER — Encounter (HOSPITAL_COMMUNITY): Payer: Self-pay | Admitting: *Deleted

## 2016-03-05 DIAGNOSIS — Z9049 Acquired absence of other specified parts of digestive tract: Secondary | ICD-10-CM | POA: Diagnosis not present

## 2016-03-05 DIAGNOSIS — I10 Essential (primary) hypertension: Secondary | ICD-10-CM | POA: Diagnosis not present

## 2016-03-05 DIAGNOSIS — L03119 Cellulitis of unspecified part of limb: Secondary | ICD-10-CM | POA: Diagnosis not present

## 2016-03-05 DIAGNOSIS — I5033 Acute on chronic diastolic (congestive) heart failure: Secondary | ICD-10-CM | POA: Diagnosis present

## 2016-03-05 DIAGNOSIS — R45850 Homicidal ideations: Secondary | ICD-10-CM | POA: Diagnosis present

## 2016-03-05 DIAGNOSIS — F309 Manic episode, unspecified: Secondary | ICD-10-CM

## 2016-03-05 DIAGNOSIS — R45851 Suicidal ideations: Secondary | ICD-10-CM | POA: Diagnosis present

## 2016-03-05 DIAGNOSIS — R600 Localized edema: Secondary | ICD-10-CM | POA: Diagnosis not present

## 2016-03-05 DIAGNOSIS — R451 Restlessness and agitation: Secondary | ICD-10-CM | POA: Diagnosis present

## 2016-03-05 DIAGNOSIS — Z79899 Other long term (current) drug therapy: Secondary | ICD-10-CM

## 2016-03-05 DIAGNOSIS — W000XXA Fall on same level due to ice and snow, initial encounter: Secondary | ICD-10-CM | POA: Diagnosis present

## 2016-03-05 DIAGNOSIS — R609 Edema, unspecified: Secondary | ICD-10-CM

## 2016-03-05 DIAGNOSIS — S299XXA Unspecified injury of thorax, initial encounter: Secondary | ICD-10-CM | POA: Diagnosis not present

## 2016-03-05 DIAGNOSIS — E119 Type 2 diabetes mellitus without complications: Secondary | ICD-10-CM | POA: Diagnosis present

## 2016-03-05 DIAGNOSIS — L03115 Cellulitis of right lower limb: Secondary | ICD-10-CM | POA: Diagnosis not present

## 2016-03-05 DIAGNOSIS — L03116 Cellulitis of left lower limb: Secondary | ICD-10-CM | POA: Diagnosis present

## 2016-03-05 DIAGNOSIS — S0990XA Unspecified injury of head, initial encounter: Secondary | ICD-10-CM | POA: Diagnosis not present

## 2016-03-05 DIAGNOSIS — Z046 Encounter for general psychiatric examination, requested by authority: Secondary | ICD-10-CM

## 2016-03-05 DIAGNOSIS — L0390 Cellulitis, unspecified: Secondary | ICD-10-CM | POA: Diagnosis present

## 2016-03-05 DIAGNOSIS — I110 Hypertensive heart disease with heart failure: Secondary | ICD-10-CM | POA: Diagnosis present

## 2016-03-05 DIAGNOSIS — R7989 Other specified abnormal findings of blood chemistry: Secondary | ICD-10-CM | POA: Diagnosis not present

## 2016-03-05 DIAGNOSIS — F419 Anxiety disorder, unspecified: Secondary | ICD-10-CM | POA: Diagnosis not present

## 2016-03-05 DIAGNOSIS — E876 Hypokalemia: Secondary | ICD-10-CM | POA: Diagnosis present

## 2016-03-05 DIAGNOSIS — F319 Bipolar disorder, unspecified: Secondary | ICD-10-CM | POA: Diagnosis present

## 2016-03-05 DIAGNOSIS — M1990 Unspecified osteoarthritis, unspecified site: Secondary | ICD-10-CM | POA: Diagnosis present

## 2016-03-05 DIAGNOSIS — R05 Cough: Secondary | ICD-10-CM | POA: Diagnosis not present

## 2016-03-05 DIAGNOSIS — Z87442 Personal history of urinary calculi: Secondary | ICD-10-CM

## 2016-03-05 DIAGNOSIS — Z23 Encounter for immunization: Secondary | ICD-10-CM | POA: Diagnosis not present

## 2016-03-05 DIAGNOSIS — M7989 Other specified soft tissue disorders: Secondary | ICD-10-CM | POA: Diagnosis not present

## 2016-03-05 DIAGNOSIS — R778 Other specified abnormalities of plasma proteins: Secondary | ICD-10-CM

## 2016-03-05 DIAGNOSIS — L02619 Cutaneous abscess of unspecified foot: Secondary | ICD-10-CM

## 2016-03-05 DIAGNOSIS — E1159 Type 2 diabetes mellitus with other circulatory complications: Secondary | ICD-10-CM

## 2016-03-05 HISTORY — DX: Cellulitis of unspecified part of limb: L03.119

## 2016-03-05 HISTORY — DX: Cutaneous abscess of unspecified foot: L02.619

## 2016-03-05 LAB — SALICYLATE LEVEL

## 2016-03-05 LAB — COMPREHENSIVE METABOLIC PANEL
ALK PHOS: 95 U/L (ref 38–126)
ALT: 41 U/L (ref 17–63)
AST: 35 U/L (ref 15–41)
Albumin: 3.6 g/dL (ref 3.5–5.0)
Anion gap: 10 (ref 5–15)
BILIRUBIN TOTAL: 1 mg/dL (ref 0.3–1.2)
BUN: 17 mg/dL (ref 6–20)
CALCIUM: 8.9 mg/dL (ref 8.9–10.3)
CO2: 28 mmol/L (ref 22–32)
CREATININE: 1.13 mg/dL (ref 0.61–1.24)
Chloride: 99 mmol/L — ABNORMAL LOW (ref 101–111)
GFR calc Af Amer: >60 mL/min (ref >60)
GLUCOSE: 178 mg/dL — AB (ref 65–99)
POTASSIUM: 3 mmol/L — AB (ref 3.5–5.1)
Sodium: 137 mmol/L (ref 135–145)
TOTAL PROTEIN: 7.1 g/dL (ref 6.5–8.1)

## 2016-03-05 LAB — URINALYSIS, ROUTINE W REFLEX MICROSCOPIC
Bilirubin Urine: NEGATIVE
Glucose, UA: NEGATIVE mg/dL
LEUKOCYTES UA: NEGATIVE
NITRITE: NEGATIVE
PH: 6 (ref 5.0–8.0)
PROTEIN: 100 mg/dL — AB
Specific Gravity, Urine: 1.02 (ref 1.005–1.030)

## 2016-03-05 LAB — LACTIC ACID, PLASMA
Lactic Acid, Venous: 1.2 mmol/L (ref 0.5–1.9)
Lactic Acid, Venous: 1.1 mmol/L (ref 0.5–1.9)

## 2016-03-05 LAB — CBC WITH DIFFERENTIAL/PLATELET
Basophils Absolute: 0 10*3/uL (ref 0.0–0.1)
Basophils Relative: 0 %
EOS ABS: 0.2 10*3/uL (ref 0.0–0.7)
EOS PCT: 2 %
HCT: 44.8 % (ref 39.0–52.0)
Hemoglobin: 15.3 g/dL (ref 13.0–17.0)
LYMPHS ABS: 1.6 10*3/uL (ref 0.7–4.0)
LYMPHS PCT: 14 %
MCH: 28.9 pg (ref 26.0–34.0)
MCHC: 34.2 g/dL (ref 30.0–36.0)
MCV: 84.7 fL (ref 78.0–100.0)
MONO ABS: 0.9 10*3/uL (ref 0.1–1.0)
MONOS PCT: 8 %
Neutro Abs: 8.6 10*3/uL — ABNORMAL HIGH (ref 1.7–7.7)
Neutrophils Relative %: 76 %
PLATELETS: 183 10*3/uL (ref 150–400)
RBC: 5.29 MIL/uL (ref 4.22–5.81)
RDW: 14.6 % (ref 11.5–15.5)
WBC: 11.4 10*3/uL — AB (ref 4.0–10.5)

## 2016-03-05 LAB — BRAIN NATRIURETIC PEPTIDE: B Natriuretic Peptide: 159 pg/mL — ABNORMAL HIGH (ref 0.0–100.0)

## 2016-03-05 LAB — RAPID URINE DRUG SCREEN, HOSP PERFORMED
Amphetamines: NOT DETECTED
BARBITURATES: NOT DETECTED
Benzodiazepines: NOT DETECTED
Cocaine: NOT DETECTED
OPIATES: NOT DETECTED
TETRAHYDROCANNABINOL: NOT DETECTED

## 2016-03-05 LAB — ACETAMINOPHEN LEVEL: Acetaminophen (Tylenol), Serum: <10 ug/mL — ABNORMAL LOW (ref 10–30)

## 2016-03-05 LAB — URINALYSIS, MICROSCOPIC (REFLEX): Squamous Epithelial / LPF: NONE SEEN

## 2016-03-05 LAB — TSH: TSH: 1.396 u[IU]/mL (ref 0.350–4.500)

## 2016-03-05 LAB — ETHANOL

## 2016-03-05 LAB — CK: Total CK: 337 U/L (ref 49–397)

## 2016-03-05 LAB — TROPONIN I: TROPONIN I: 0.03 ng/mL — AB (ref <0.03)

## 2016-03-05 MED ORDER — POTASSIUM CHLORIDE CRYS ER 20 MEQ PO TBCR
40.0000 meq | EXTENDED_RELEASE_TABLET | Freq: Once | ORAL | Status: AC
  Administered 2016-03-05: 40 meq via ORAL
  Filled 2016-03-05: qty 2

## 2016-03-05 MED ORDER — HYDROCHLOROTHIAZIDE 25 MG PO TABS
25.0000 mg | ORAL_TABLET | Freq: Once | ORAL | Status: AC
  Administered 2016-03-05: 25 mg via ORAL
  Filled 2016-03-05: qty 1

## 2016-03-05 MED ORDER — SERTRALINE HCL 50 MG PO TABS
50.0000 mg | ORAL_TABLET | Freq: Every day | ORAL | Status: DC
  Administered 2016-03-06 – 2016-03-07 (×2): 50 mg via ORAL
  Filled 2016-03-05 (×3): qty 1

## 2016-03-05 MED ORDER — LISINOPRIL 10 MG PO TABS
10.0000 mg | ORAL_TABLET | Freq: Every day | ORAL | Status: DC
  Administered 2016-03-06 – 2016-03-07 (×2): 10 mg via ORAL
  Filled 2016-03-05 (×2): qty 1

## 2016-03-05 MED ORDER — PANTOPRAZOLE SODIUM 40 MG PO TBEC
40.0000 mg | DELAYED_RELEASE_TABLET | Freq: Every day | ORAL | Status: DC
  Administered 2016-03-05 – 2016-03-07 (×3): 40 mg via ORAL
  Filled 2016-03-05 (×3): qty 1

## 2016-03-05 MED ORDER — LISINOPRIL 10 MG PO TABS
10.0000 mg | ORAL_TABLET | Freq: Once | ORAL | Status: AC
  Administered 2016-03-05: 10 mg via ORAL
  Filled 2016-03-05: qty 1

## 2016-03-05 MED ORDER — ASPIRIN EC 81 MG PO TBEC
81.0000 mg | DELAYED_RELEASE_TABLET | Freq: Every day | ORAL | Status: DC
  Administered 2016-03-05 – 2016-03-07 (×3): 81 mg via ORAL
  Filled 2016-03-05 (×3): qty 1

## 2016-03-05 MED ORDER — VITAMIN D 1000 UNITS PO TABS
1000.0000 [IU] | ORAL_TABLET | Freq: Every day | ORAL | Status: DC
  Administered 2016-03-05 – 2016-03-07 (×3): 1000 [IU] via ORAL
  Filled 2016-03-05 (×3): qty 1

## 2016-03-05 MED ORDER — PRAVASTATIN SODIUM 10 MG PO TABS
20.0000 mg | ORAL_TABLET | Freq: Every day | ORAL | Status: DC
  Administered 2016-03-05 – 2016-03-06 (×2): 20 mg via ORAL
  Filled 2016-03-05 (×2): qty 2

## 2016-03-05 MED ORDER — SODIUM CHLORIDE 0.9% FLUSH
3.0000 mL | Freq: Two times a day (BID) | INTRAVENOUS | Status: DC
  Administered 2016-03-05 – 2016-03-07 (×4): 3 mL via INTRAVENOUS

## 2016-03-05 MED ORDER — FUROSEMIDE 10 MG/ML IJ SOLN
40.0000 mg | Freq: Two times a day (BID) | INTRAMUSCULAR | Status: DC
  Administered 2016-03-05 – 2016-03-07 (×4): 40 mg via INTRAVENOUS
  Filled 2016-03-05 (×4): qty 4

## 2016-03-05 MED ORDER — PNEUMOCOCCAL VAC POLYVALENT 25 MCG/0.5ML IJ INJ
0.5000 mL | INJECTION | INTRAMUSCULAR | Status: AC
  Administered 2016-03-06: 0.5 mL via INTRAMUSCULAR
  Filled 2016-03-05: qty 0.5

## 2016-03-05 MED ORDER — AMLODIPINE BESYLATE 5 MG PO TABS
5.0000 mg | ORAL_TABLET | Freq: Once | ORAL | Status: AC
  Administered 2016-03-05: 5 mg via ORAL
  Filled 2016-03-05: qty 1

## 2016-03-05 MED ORDER — LINAGLIPTIN 5 MG PO TABS
5.0000 mg | ORAL_TABLET | Freq: Every day | ORAL | Status: DC
  Administered 2016-03-05 – 2016-03-07 (×3): 5 mg via ORAL
  Filled 2016-03-05 (×3): qty 1

## 2016-03-05 MED ORDER — POTASSIUM CHLORIDE CRYS ER 20 MEQ PO TBCR
40.0000 meq | EXTENDED_RELEASE_TABLET | Freq: Every day | ORAL | Status: DC
  Administered 2016-03-06: 40 meq via ORAL
  Filled 2016-03-05 (×2): qty 2

## 2016-03-05 MED ORDER — HALOPERIDOL LACTATE 5 MG/ML IJ SOLN
5.0000 mg | Freq: Once | INTRAMUSCULAR | Status: AC
  Administered 2016-03-05: 5 mg via INTRAMUSCULAR
  Filled 2016-03-05: qty 1

## 2016-03-05 MED ORDER — GUAIFENESIN-DM 100-10 MG/5ML PO SYRP
5.0000 mL | ORAL_SOLUTION | ORAL | Status: DC | PRN
  Administered 2016-03-05 – 2016-03-07 (×8): 5 mL via ORAL
  Filled 2016-03-05 (×8): qty 5

## 2016-03-05 MED ORDER — ENOXAPARIN SODIUM 40 MG/0.4ML ~~LOC~~ SOLN
40.0000 mg | SUBCUTANEOUS | Status: DC
  Administered 2016-03-05 – 2016-03-06 (×2): 40 mg via SUBCUTANEOUS
  Filled 2016-03-05 (×2): qty 0.4

## 2016-03-05 MED ORDER — ACETAMINOPHEN 325 MG PO TABS
650.0000 mg | ORAL_TABLET | Freq: Four times a day (QID) | ORAL | Status: DC | PRN
  Administered 2016-03-05: 650 mg via ORAL
  Filled 2016-03-05: qty 2

## 2016-03-05 MED ORDER — VITAMIN C 500 MG PO TABS
500.0000 mg | ORAL_TABLET | Freq: Every day | ORAL | Status: DC
  Administered 2016-03-05 – 2016-03-07 (×3): 500 mg via ORAL
  Filled 2016-03-05 (×3): qty 1

## 2016-03-05 MED ORDER — DEXTROSE 5 % IV SOLN
2.0000 g | INTRAVENOUS | Status: DC
  Administered 2016-03-05 – 2016-03-06 (×2): 2 g via INTRAVENOUS
  Filled 2016-03-05 (×4): qty 2

## 2016-03-05 NOTE — ED Notes (Signed)
Pt left running out of ED with no shoes or shirt on.  PD called and is speaking to pt in parking lot.

## 2016-03-05 NOTE — ED Notes (Signed)
Pt's daughter Redgie Grayer called stating she also has additional information regarding behavior.  [phone].  Daughter states they have been working on placement due to suicidal comments.  States this past weekend pt made suicidal comments and had a gun in the living room.  Daughter lives in [place] due to [employer] and has not been able to make contact with pt.    Pt also has a case worker named Ammie Ferrier [phone] who has been working on an emergency evaluation.  Stated she had to make contact with pt before she could do anything and pt has been staying at various hotels.

## 2016-03-05 NOTE — ED Notes (Signed)
Pt talking to himself in room.  States he knows he is mentally ill, but his daughter and power of attorney want to commit him to get all his money.  This is because he gave away a brand new car.  Pt agitated, but able to be verbally redirected and calmed.
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
## 2016-03-05 NOTE — ED Notes (Signed)
Pt bathing in sink.  Provided with towel to dry self.

## 2016-03-05 NOTE — ED Notes (Signed)
Pt refusing IV access at this time. States he does not need this right now.

## 2016-03-05 NOTE — ED Notes (Signed)
Officer arrived with IVC papers.

## 2016-03-05 NOTE — ED Notes (Signed)
Pt refusing repeat vitals at this time.

## 2016-03-05 NOTE — ED Notes (Signed)
Pt has again removed himself from the monitor and is discussing his money.  Continues to deny anything being wrong, but is agreeing to stay in hospital.

## 2016-03-05 NOTE — ED Notes (Signed)
CRITICAL VALUE ALERT  Critical value received:  Troponin 0.03  Date of notification:  03/05/16  Time of notification:  0845  Critical value read back:Yes.    Nurse who received alert:  Laurell Josephs RN  MD notified (1st page):  Thurnell Garbe  Time of first page:  0845  MD notified (2nd page):  Time of second page:  Responding MD:  Thurnell Garbe  Time MD responded:  [phone]

## 2016-03-05 NOTE — ED Notes (Signed)
Lab informed me the UDS results would be a while because machine was down at this time.

## 2016-03-05 NOTE — ED Notes (Addendum)
Pt medicated as ordered.  Pt is very agitated and will not speak to staff.  After being given shot, pt stated "I would rather go home and blow my brains out than stay in a hospital or take medicine."  Officer at bedside.

## 2016-03-05 NOTE — ED Notes (Signed)
Pt's POA [facility]) called stating she found out pt is here and will be here this afternoon.  If pt has psych eval, she can be reached at [phone] for additional information.
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
## 2016-03-05 NOTE — ED Notes (Signed)
Security placed pt valuables in safe.

## 2016-03-05 NOTE — H&P (Signed)
History and Physical    [name] DDU:[id number] DOB: Oct 16, 1950 DOA: 03/05/2016  PCP: Alonza Bogus, MD  Patient coming from: home.    Chief Complaint:  Swelling of the extremities.  Note:  Involuntary committed.   HPI: [name] is an 66 y.o. male with hx of Bipolar illness, DM, HTN, presented to the ER several times with complaints of bilateral pedal edema.  He also was found to be inappropriate, talking to himself, being in the frigid cold weather with inadequate clothing.  He reportedly now lives at this parents home, and had been giving away his money.  Family had been urging him to seek psychiatric care, but he did not consent to it.  In the ER, his head CT was negative, and his leg doppler was negative.  His serology was rather unremarkable and not clinically helpful.  He wanted to leave, but EDP committed him as he was not competent and would hurt himself.  He does have elevated CPK, and troponin was at 0.03.  He denied any chest pain.  He subsequently sneaked out, and the [employer] brought him back.  He was being physically aggressive with LEO, and was given IM Haldol.  Hospitalist was asked to admit him for further work up.     ED Course:  See above.  Rewiew of Systems:  Constitutional: Negative for malaise, fever and chills. No significant weight loss or weight gain Eyes: Negative for eye pain, redness and discharge, diplopia, visual changes, or flashes of light. ENMT: Negative for ear pain, hoarseness, nasal congestion, sinus pressure and sore throat. No headaches; tinnitus, drooling, or problem swallowing. Cardiovascular: Negative for chest pain, palpitations, diaphoresis, dyspnea and peripheral edema. ; No orthopnea, PND Respiratory: Negative for cough, hemoptysis, wheezing and stridor. No pleuritic chestpain. Gastrointestinal: Negative for diarrhea, constipation,  melena, blood in stool, hematemesis, jaundice and rectal bleeding.    Genitourinary:  Negative for frequency, dysuria, incontinence,flank pain and hematuria; Musculoskeletal: Negative for back pain and neck pain. Negative for and trauma.;  Skin: . Negative for pruritus, rash, abrasions, bruising and skin lesion.; ulcerations Neuro: Negative for headache, lightheadedness and neck stiffness. Negative for weakness, altered level of consciousness , altered mental status, extremity weakness, burning feet, involuntary movement, seizure and syncope.  Psych: negative for anxiety, depression, insomnia, tearfulness, panic attacks, hallucinations, paranoia, suicidal or homicidal ideation    Past Medical History:  Diagnosis Date  . Arthritis   . Bipolar disorder ([place])   . Diabetes mellitus without complication ([place])    boderline  . Heart murmur 1995  . Hypertension   . Kidney stones     Past Surgical History:  Procedure Laterality Date  . APPENDECTOMY    . CHOLECYSTECTOMY N/A 10/20/2013   Procedure: LAPAROSCOPIC CHOLECYSTECTOMY;  Surgeon: [name], MD;  Location: [facility];  Service: General;  Laterality: N/A;  . HIP SURGERY    . KNEE SURGERY       reports that he has never smoked. He has never used smokeless tobacco. He reports that he does not drink alcohol or use drugs.  Allergies  Allergen Reactions  . Codeine Nausea And Vomiting  . Morphine And Related Nausea And Vomiting    No family history on file.   Prior to Admission medications   Medication Sig Start Date End Date Taking? Authorizing Provider  amLODipine (NORVASC) 5 MG tablet Take 5 mg by mouth daily. 08/30/13  Yes Historical Provider, MD  cholecalciferol (VITAMIN D) 1000 units tablet Take 1,000  Units by mouth daily.   Yes Historical Provider, MD  hydrochlorothiazide (HYDRODIURIL) 25 MG tablet Take 25 mg by mouth daily. 07/19/13  Yes Historical Provider, MD  linagliptin (TRADJENTA) 5 MG TABS tablet Take 5 mg by mouth daily.   Yes Historical Provider, MD  lisinopril (PRINIVIL,ZESTRIL) 10 MG tablet Take 10 mg by  mouth daily. 08/30/13  Yes Historical Provider, MD  omeprazole (PRILOSEC) 20 MG capsule Take 20 mg by mouth daily. 09/13/13  Yes Historical Provider, MD  potassium chloride SA (K-DUR,KLOR-CON) 20 MEQ tablet Take 20 mEq by mouth daily. 09/13/13  Yes Historical Provider, MD  pravastatin (PRAVACHOL) 20 MG tablet Take 20 mg by mouth at bedtime. 05/07/15  Yes Historical Provider, MD  sertraline (ZOLOFT) 50 MG tablet Take 50 mg by mouth daily.   Yes Historical Provider, MD  vitamin C (ASCORBIC ACID) 500 MG tablet Take 500 mg by mouth daily.   Yes Historical Provider, MD    Physical Exam: Vitals:   03/05/16 0753 03/05/16 1004 03/05/16 1008  BP: (!) 158/111 164/98   Pulse: 105  91  Resp: '20 18 20  '$ Temp: 97.6 F (36.4 C)    TempSrc: Oral    SpO2: 98%  99%  Weight: 99.8 kg (220 lb)    Height: '5\' 4"'$  (1.626 m)        Constitutional: NAD, calm, comfortable Vitals:   03/05/16 0753 03/05/16 1004 03/05/16 1008  BP: (!) 158/111 164/98   Pulse: 105  91  Resp: '20 18 20  '$ Temp: 97.6 F (36.4 C)    TempSrc: Oral    SpO2: 98%  99%  Weight: 99.8 kg (220 lb)    Height: '5\' 4"'$  (1.626 m)     Eyes: PERRL, lids and conjunctivae normal ENMT: Mucous membranes are moist. Posterior pharynx clear of any exudate or lesions.Normal dentition.  Neck: normal, supple, no masses, no thyromegaly Respiratory: clear to auscultation bilaterally, no wheezing, no crackles. Normal respiratory effort. No accessory muscle use.  Cardiovascular: Regular rate and rhythm, no murmurs / rubs / gallops. No extremity edema. 2+ pedal pulses. No carotid bruits.  Abdomen: no tenderness, no masses palpated. No hepatosplenomegaly. Bowel sounds positive.  Musculoskeletal: no clubbing / cyanosis. No joint deformity upper and lower extremities. Good ROM, no contractures. Normal muscle tone.  Skin: There is erythema of the lower extremities.  Some swelling.  Neurologic: CN 2-12 grossly intact. Sensation intact, DTR normal. Strength 5/5 in all  4.  Psychiatric: Normal judgment and insight. Alert and oriented x 3. Normal mood.     Labs on Admission: I have personally reviewed following labs and imaging studies  CBC:  Recent Labs Lab 02/28/16 0021 03/05/16 0800  WBC 11.4* 11.4*  NEUTROABS 8.3* 8.6*  HGB 14.9 15.3  HCT 43.4 44.8  MCV 84.1 84.7  PLT 168 604   Basic Metabolic Panel:  Recent Labs Lab 02/28/16 0021 03/05/16 0800  NA 135 137  K 3.2* 3.0*  CL 101 99*  CO2 26 28  GLUCOSE 159* 178*  BUN 21* 17  CREATININE 1.24 1.13  CALCIUM 8.8* 8.9   GFR: Estimated Creatinine Clearance: 68.6 mL/min (by C-G formula based on SCr of 1.13 mg/dL). Liver Function Tests:  Recent Labs Lab 02/28/16 0021 03/05/16 0800  AST 36 35  ALT 37 41  ALKPHOS 94 95  BILITOT 0.6 1.0  PROT 6.7 7.1  ALBUMIN 3.6 3.6   Cardiac Enzymes:  Recent Labs Lab 02/28/16 0021 02/28/16 0501 03/05/16 0800  CKTOTAL 521* 481* 337  TROPONINI  --   --  0.03*    Urine analysis:    Component Value Date/Time   COLORURINE YELLOW 03/05/2016 [place] 03/05/2016 0941   LABSPEC 1.020 03/05/2016 0941   PHURINE 6.0 03/05/2016 0941   GLUCOSEU NEGATIVE 03/05/2016 0941   HGBUR SMALL (A) 03/05/2016 0941   BILIRUBINUR NEGATIVE 03/05/2016 0941   KETONESUR TRACE (A) 03/05/2016 0941   PROTEINUR 100 (A) 03/05/2016 0941   UROBILINOGEN 0.2 09/27/2013 2138   NITRITE NEGATIVE 03/05/2016 0941   LEUKOCYTESUR NEGATIVE 03/05/2016 0941   Radiological Exams on Admission: Dg Chest 2 View  Result Date: 03/05/2016 CLINICAL DATA:  Ankle swelling, fell backwards on the side of the road striking his head on the ice, preceded by BILATERAL leg swelling and redness, history hypertension, heart murmur, diabetes mellitus EXAM: CHEST  2 VIEW COMPARISON:  08/04/2015 FINDINGS: Borderline enlargement of cardiac silhouette. Tortuous thoracic aorta. Mediastinal contours and pulmonary vascularity otherwise normal. Lungs clear. No pulmonary infiltrate,  pleural effusion or pneumothorax. Bones appear demineralized with degenerative disc disease changes at caudal aspect of thoracic spine. Surgical clips RIGHT upper quadrant likely due to cholecystectomy. IMPRESSION: No acute abnormalities. Electronically Signed   By: Lavonia Dana M.D.   On: 03/05/2016 08:27   Ct Head Wo Contrast  Result Date: 03/05/2016 CLINICAL DATA:  Fall.  Head injury EXAM: CT HEAD WITHOUT CONTRAST TECHNIQUE: Contiguous axial images were obtained from the base of the skull through the vertex without intravenous contrast. COMPARISON:  None. FINDINGS: Brain: Cerebral volume normal. Ventricle size normal. Mild hypodensity in the cerebral white matter bilaterally most compatible with chronic microvascular ischemia. Negative for acute infarct. Negative for hemorrhage or mass. Benign-appearing calcifications in the fourth ventricle bilaterally. Vascular: No hyperdense vessel or unexpected calcification. Skull: Negative for fracture. Sinuses/Orbits: Negative Other: None IMPRESSION: No acute intracranial abnormality. Mild white matter disease likely related to chronic microvascular ischemia. Electronically Signed   By: Franchot Gallo M.D.   On: 03/05/2016 08:56   US Venous Img Lower Unilateral Left  Result Date: 03/05/2016 CLINICAL DATA:  Left lower extremity pain, erythema, and edema for 1 month. EXAM: LEFT LOWER EXTREMITY VENOUS DUPLEX ULTRASOUND TECHNIQUE: Doppler venous assessment of the left lower extremity deep venous system was performed, including characterization of spectral flow, compressibility, and phasicity. COMPARISON:  None. FINDINGS: There is complete compressibility of the left common femoral, femoral, and popliteal veins. Doppler analysis demonstrates respiratory phasicity and augmentation of flow with calf compression. No obvious superficial vein or calf vein thrombosis. Subcutaneous edema in the calf and ankle is noted. IMPRESSION: No evidence of left lower extremity DVT.  Electronically Signed   By: Marybelle Killings M.D.   On: 03/05/2016 10:02    EKG: Independently reviewed.  Assessment/Plan Principal Problem:   Cellulitis and abscess of foot Active Problems:   Bipolar 1 disorder (HCC)   DM (diabetes mellitus) ([place])   HTN (hypertension)   Involuntary commitment   Cellulitis    PLAN:   Bipolar illness:  He is in a little bit of a manic phase.  Not severe.  Giving away all his money.  This has not been his normal pattern of behavior.  He is INVOLUNTARY COMMITTED, and will need psych consultation at some point.  He is very inappropriate at this time, but redirectable.  Use Haldol PRN if required.   Cellulitis:  I think he also has cellulitis and is being Tx with IV Rocephin.  I demarcated the border.  Swelling of the lower extremities:  Will d/c Norvasc (can cause pedal edema)   Tx Cellulitis.  [place] is negative for DVT.  Will give some IV Lasix for dependent edema.  He has no proteinuria, normal kidney Fx, and will obtain ECHO.   DM:  Continue with carb modified diet.  Continue with meds.   SSI.   HTN:  Would continue with ACE I.  Hold oral diuretic as he is being given IV Lasix.    DVT prophylaxis: Lovenox.  Code Status: FULL CODE>  Family Communication: None at bedside.  Disposition Plan: TBD.  Consults called: None.  Admission status: Inpatient.    Lenton Gendreau MD FACP. [facility]  If 7PM-7AM, please contact night-coverage [email or web address] Password [facility]  03/05/2016, 11:52 AM

## 2016-03-05 NOTE — Progress Notes (Signed)
Patient discussed with ER staff, presents with primary bipolar and psych related issues as well as swelling and pain of bilateral legs. No cardiopulmonary symptoms. A troponin was checked and came back at 0.03. EKG without acute ischemic changes. Significantly hypertensive in ER, may be cause of very slight troponin. Recommend cycling enzymes overnight, check echo given his leg swelling. We will follow enzyme trend and echo, if nonsignificant then no plans for further cardiac evaluation or further consultation.    Carlyle Dolly MD

## 2016-03-06 ENCOUNTER — Inpatient Hospital Stay (HOSPITAL_COMMUNITY): Payer: Medicare Other

## 2016-03-06 DIAGNOSIS — I10 Essential (primary) hypertension: Secondary | ICD-10-CM

## 2016-03-06 LAB — BASIC METABOLIC PANEL
ANION GAP: 9 (ref 5–15)
BUN: 16 mg/dL (ref 6–20)
CALCIUM: 8.7 mg/dL — AB (ref 8.9–10.3)
CHLORIDE: 99 mmol/L — AB (ref 101–111)
CO2: 30 mmol/L (ref 22–32)
Creatinine, Ser: 1.37 mg/dL — ABNORMAL HIGH (ref 0.61–1.24)
GFR calc Af Amer: >60 mL/min (ref >60)
GFR calc non Af Amer: 52 mL/min — ABNORMAL LOW (ref >60)
GLUCOSE: 136 mg/dL — AB (ref 65–99)
Potassium: 3.5 mmol/L (ref 3.5–5.1)
Sodium: 138 mmol/L (ref 135–145)

## 2016-03-06 LAB — ECHOCARDIOGRAM COMPLETE
HEIGHTINCHES: 64 in
WEIGHTICAEL: 2987.67 [oz_av]

## 2016-03-06 MED ORDER — POTASSIUM CHLORIDE CRYS ER 20 MEQ PO TBCR
40.0000 meq | EXTENDED_RELEASE_TABLET | Freq: Once | ORAL | Status: AC
  Administered 2016-03-06: 40 meq via ORAL

## 2016-03-06 NOTE — Progress Notes (Signed)
Notified Dr Luan Pulling at the office about TTS recommendation.

## 2016-03-06 NOTE — BH Assessment (Addendum)
Tele Assessment Note   [name] is a 66 y.o. male in [facility] under IVC due to poor decision making perpetuated by suspected hypomania. It was also reported that pt has made some suicidal and homicidal comments. Pt denied having any SI or making any suicidal comments. Pt admits to making homicidal comments towards a nurse at his mother's nursing home, [facility]. Pt explains that he felt the nurse was threatening him and he made these comments, he thought, in confidence to his daughter. Pt also admits to having a big gun in the home as he planned to hitchhike to the doctor's office recently and was going to bring the gun with him to hit any dogs that may try to attack him. Pt additionally shared that he bought a new car and decided to give it away to a waitress. Pt also reports sleeping 30 minutes-an hour in entirety over the past 3 weeks due to being "on the run" from his daughter, who's been trying to commit him.   Pt talked to [facility], pt's POA, for collateral information. She basically confirmed all that pt shared with clinician regarding the homicidal threats towards the nurse, trying to give away a new car to a "complete stranger", and threatening to "kill any dog" that he encountered along the way to the hospital (she shared that she was planning on taking him to the urgent care, but he told her if she didn't come by a certain time, he would walk there with the gun for any dog that was in his way).    Diagnosis: Bipolar I, by hx  Past Medical History:  Past Medical History:  Diagnosis Date  . Arthritis   . Bipolar disorder ([place])   . Diabetes mellitus without complication ([place])    boderline  . Heart murmur 1995  . Hypertension   . Kidney stones     Past Surgical History:  Procedure Laterality Date  . APPENDECTOMY    . CHOLECYSTECTOMY N/A 10/20/2013   Procedure: LAPAROSCOPIC CHOLECYSTECTOMY;  Surgeon: [name], MD;  Location: [facility];  Service: General;  Laterality:  N/A;  . HIP SURGERY    . KNEE SURGERY      Family History: History reviewed. No pertinent family history.  Social History:  reports that he has never smoked. He has never used smokeless tobacco. He reports that he does not drink alcohol or use drugs.  Additional Social History:  Alcohol / Drug Use Pain Medications: Pt not on any medication. Prescriptions: Pt not on any medication Over the Counter: See PTA medication list History of alcohol / drug use?: No history of alcohol / drug abuse  CIWA: CIWA-Ar BP: 116/70 Pulse Rate: 94 COWS:    PATIENT STRENGTHS: (choose at least two) Active sense of humor Average or above average intelligence Capable of independent living Communication skills  Allergies:  Allergies  Allergen Reactions  . Codeine Nausea And Vomiting  . Morphine And Related Nausea And Vomiting    Home Medications:  Medications Prior to Admission  Medication Sig Dispense Refill  . amLODipine (NORVASC) 5 MG tablet Take 5 mg by mouth daily.    . cholecalciferol (VITAMIN D) 1000 units tablet Take 1,000 Units by mouth daily.    . hydrochlorothiazide (HYDRODIURIL) 25 MG tablet Take 25 mg by mouth daily.    Marland Kitchen linagliptin (TRADJENTA) 5 MG TABS tablet Take 5 mg by mouth daily.    Marland Kitchen lisinopril (PRINIVIL,ZESTRIL) 10 MG tablet Take 10 mg by  mouth daily.    Marland Kitchen omeprazole (PRILOSEC) 20 MG capsule Take 20 mg by mouth daily.    . potassium chloride SA (K-DUR,KLOR-CON) 20 MEQ tablet Take 20 mEq by mouth daily.    . pravastatin (PRAVACHOL) 20 MG tablet Take 20 mg by mouth at bedtime.    . sertraline (ZOLOFT) 50 MG tablet Take 50 mg by mouth daily.    . vitamin C (ASCORBIC ACID) 500 MG tablet Take 500 mg by mouth daily.      OB/GYN Status:  No LMP for male patient.  General Assessment Data Location of Assessment: [facility] ED TTS Assessment: In system Is this a Tele or Face-to-Face Assessment?: Tele Assessment Is this an Initial Assessment or a Re-assessment for this encounter?:  Initial Assessment Marital status: Single Living Arrangements: Alone Can pt return to current living arrangement?: Yes Admission Status: Involuntary Is patient capable of signing voluntary admission?: Yes Referral Source: Self/Family/Friend     Crisis Care Plan Living Arrangements: Alone Name of Psychiatrist: None Name of Therapist: None  Education Status Is patient currently in school?: No  Risk to self with the past 6 months Suicidal Ideation: No Has patient been a risk to self within the past 6 months prior to admission? : No Suicidal Intent: No Has patient had any suicidal intent within the past 6 months prior to admission? : No Is patient at risk for suicide?: No Suicidal Plan?: No Has patient had any suicidal plan within the past 6 months prior to admission? : No Access to Means: No Previous Attempts/Gestures: No Intentional Self Injurious Behavior: None Family Suicide History: No Recent stressful life event(s): Conflict (Comment) Persecutory voices/beliefs?: No Depression: No Depression Symptoms: Insomnia Substance abuse history and/or treatment for substance abuse?: No Suicide prevention information given to non-admitted patients: Not applicable  Risk to Others within the past 6 months Homicidal Ideation: No Does patient have any lifetime risk of violence toward others beyond the six months prior to admission? : No Thoughts of Harm to Others: No Current Homicidal Intent: No Current Homicidal Plan: No Access to Homicidal Means: No History of harm to others?: No Assessment of Violence: None Noted Does patient have access to weapons?: No Criminal Charges Pending?: No Does patient have a court date: No Is patient on probation?: No  Psychosis Hallucinations: None noted Delusions: None noted  Mental Status Report Appearance/Hygiene: Unremarkable Eye Contact: Good Motor Activity: Unremarkable Speech: Logical/coherent Level of Consciousness: Alert Mood:  Pleasant Affect: Other (Comment) (Pleasant; Euthymic) Anxiety Level: None Thought Processes: Coherent, Relevant Judgement: Unable to Assess Orientation: Appropriate for developmental age Obsessive Compulsive Thoughts/Behaviors: None  Cognitive Functioning Concentration: Normal Memory: Recent Intact, Remote Intact IQ: Average Insight: Fair Impulse Control: Good Appetite: Good Sleep: Decreased Total Hours of Sleep:  (71mn-1hr w/in past 3 weeks) Vegetative Symptoms: None  [facility] Patient's cognitive ability adequate to safely complete daily activities?: Yes Patient able to express need for assistance with ADLs?: Yes Independently performs ADLs?: Yes (appropriate for developmental age)  Prior Inpatient Therapy Prior Inpatient Therapy: Yes Prior Therapy Dates: Over 20 years ago [facility]  Prior Outpatient Therapy Prior Outpatient Therapy: Yes Prior Therapy Dates: Over 2 years ago Prior Therapy Facilty/Provider(s): Dr. RReece LevyReason for Treatment: med management Does patient have an ACCT team?: No Does patient have Intensive In-House Services?  : No Does patient have [facility] services? : No Does patient have P4CC services?: No  ADL Screening (condition at time of admission) Patient's cognitive  ability adequate to safely complete daily activities?: Yes Is the patient deaf or have difficulty hearing?: Yes (A little bit of problems) Does the patient have difficulty seeing, even when wearing glasses/contacts?: No Does the patient have difficulty concentrating, remembering, or making decisions?: No Patient able to express need for assistance with ADLs?: Yes Does the patient have difficulty dressing or bathing?: No Independently performs ADLs?: Yes (appropriate for developmental age) Communication: Independent Is this a change from baseline?: Pre-admission baseline Dressing (OT):  Independent Grooming: Independent Feeding: Independent Bathing: Independent Toileting: Independent In/Out Bed: Independent Walks in Home: Independent Does the patient have difficulty walking or climbing stairs?: No Weakness of Legs: None Weakness of Arms/Hands: None  Home Assistive Devices/Equipment Home Assistive Devices/Equipment: None  Therapy Consults (therapy consults require a physician order) PT Evaluation Needed: No OT Evalulation Needed: No SLP Evaluation Needed: No Abuse/Neglect Assessment (Assessment to be complete while patient is alone) Physical Abuse: Denies Verbal Abuse: Denies Sexual Abuse: Denies Exploitation of patient/patient's resources: Denies Self-Neglect: Denies Values / Beliefs Cultural Requests During Hospitalization: None Spiritual Requests During Hospitalization: None Consults Spiritual Care Consult Needed: Yes (Comment) (IVC) Social Work Consult Needed: Yes (Comment) Regulatory affairs officer (For Healthcare) Does Patient Have a Medical Advance Directive?: Yes Does patient want to make changes to medical advance directive?: No - Patient declined Type of Advance Directive: [place] ([place], [place] per pt) [place] in Chart?: No - copy requested [place] and Now in Chart: Copy in chart Nutrition Screen- [place] Adult/[facility] Patient's home diet: Carb modified Has the patient recently lost weight without trying?: Yes, 2-13 lbs. Has the patient been eating poorly because of a decreased appetite?: No Malnutrition Screening Tool Score: 1  Additional Information 1:1 In Past 12 Months?: Yes CIRT Risk: No Elopement Risk: No Does patient have medical clearance?: Yes     Disposition:  Disposition Initial Assessment Completed for this Encounter: Yes (consulted with Jinny Blossom, NP) Disposition of Patient: Inpatient treatment program Type of inpatient treatment program: Adult (pt  recommended for gero-psych, once medically cleared)  Rexene Edison 03/06/2016 9:33 AM

## 2016-03-06 NOTE — Progress Notes (Signed)
*  PRELIMINARY RESULTS* Echocardiogram 2D Echocardiogram has been performed.  Leavy Cella 03/06/2016, 12:02 PM

## 2016-03-06 NOTE — Progress Notes (Signed)
Consult called to [facility].  Per [facility], place order for TTS evaluation.  TTS Consult placed at this time.

## 2016-03-06 NOTE — BH Assessment (Signed)
Faxed clinical to [facility], [place], [place], [place], [place], [place], [place], Hospital doctor, [place]

## 2016-03-06 NOTE — Progress Notes (Signed)
Subjective: He was admitted yesterday with cellulitis of his legs and bipolar disease. Over the last several weeks he's been at least hypomanic if not manic and has been making poor decisions. His power of attorney called me about 2 weeks ago and told me that he was making threatening comments about a nurse that was taking care of his mother at the nursing home. He has been banned from the nursing home. At that point I suggested that they seek involuntary commitment. He has been making poor financial decisions and apparently has given away a new car. He's been living in a hotel. His home is apparently in disrepair. He had a physical altercation with police officers in the emergency department yesterday. He has been involuntarily committed which I think is appropriate he has a history of bipolar disease and had been following with a psychiatrist but stopped that apparently about a year and a half ago and has not had any medicine. He initially did okay but has had much more issue in the last month or so. I saw him in my office last week and he was not overtly homicidal or suicidal. He refused any medications. This morning he still has some pressure of speech. He spent most of the night last night walking back and forth in his room. He has a [employer] cabin crew. He still says he doesn't think he needs any medications but says that he would agree to take some if I think he needs it and I told him I think he will.  Objective: Vital signs in last 24 hours: Temp:  [97.8 F (36.6 C)-98.6 F (37 C)] 98.6 F (37 C) (01/19 0542) Pulse Rate:  [63-94] 94 (01/19 0542) Resp:  [18-20] 18 (01/19 0542) BP: (108-164)/(70-98) 116/70 (01/19 0542) SpO2:  [96 %-99 %] 98 % (01/19 0542) Weight:  [84.7 kg (186 lb 11.7 oz)] 84.7 kg (186 lb 11.7 oz) (01/18 1518) Weight change:  Last BM Date: 03/04/16  Intake/Output from previous day: 01/18 0701 - 01/19 0700 In: 20 [IV Piggyback:50] Out: -   PHYSICAL EXAM General appearance:  alert and Agitated some pressure of speech when paranoid ideation Resp: alert and Clear chest Cardio: regular rate and rhythm, S1, S2 normal, no murmur, click, rub or gallop GI: soft, non-tender; bowel sounds normal; no masses,  no organomegaly Extremities: He has swelling and erythema of both legs. No different than the markings provided yesterday. Pupils react. Skin warm and dry. Mucous membranes mildly dry  Lab Results:  Results for orders placed or performed during the hospital encounter of 03/05/16 (from the past 48 hour(s))  Acetaminophen level     Status: Abnormal   Collection Time: 03/05/16  7:57 AM  Result Value Ref Range   Acetaminophen (Tylenol), Serum <10 (L) 10 - 30 ug/mL    Comment:        THERAPEUTIC CONCENTRATIONS VARY SIGNIFICANTLY. A RANGE OF 10-30 ug/mL MAY BE AN EFFECTIVE CONCENTRATION FOR MANY PATIENTS. HOWEVER, SOME ARE BEST TREATED AT CONCENTRATIONS OUTSIDE THIS RANGE. ACETAMINOPHEN CONCENTRATIONS >150 ug/mL AT 4 HOURS AFTER INGESTION AND >50 ug/mL AT 12 HOURS AFTER INGESTION ARE OFTEN ASSOCIATED WITH TOXIC REACTIONS.   Ethanol     Status: None   Collection Time: 03/05/16  7:57 AM  Result Value Ref Range   Alcohol, Ethyl (B) <5 <5 mg/dL    Comment:        LOWEST DETECTABLE LIMIT FOR SERUM ALCOHOL IS 5 mg/dL FOR MEDICAL PURPOSES ONLY   Salicylate level  Status: None   Collection Time: 03/05/16  7:57 AM  Result Value Ref Range   Salicylate Lvl <5.9 2.8 - 30.0 mg/dL  Comprehensive metabolic panel     Status: Abnormal   Collection Time: 03/05/16  8:00 AM  Result Value Ref Range   Sodium 137 135 - 145 mmol/L   Potassium 3.0 (L) 3.5 - 5.1 mmol/L   Chloride 99 (L) 101 - 111 mmol/L   CO2 28 22 - 32 mmol/L   Glucose, Bld 178 (H) 65 - 99 mg/dL   BUN 17 6 - 20 mg/dL   Creatinine, Ser 1.13 0.61 - 1.24 mg/dL   Calcium 8.9 8.9 - 10.3 mg/dL   Total Protein 7.1 6.5 - 8.1 g/dL   Albumin 3.6 3.5 - 5.0 g/dL   AST 35 15 - 41 U/L   ALT 41 17 - 63 U/L    Alkaline Phosphatase 95 38 - 126 U/L   Total Bilirubin 1.0 0.3 - 1.2 mg/dL   GFR calc non Af Amer >60 >60 mL/min   GFR calc Af Amer >60 >60 mL/min    Comment: (NOTE) The eGFR has been calculated using the CKD EPI equation. This calculation has not been validated in all clinical situations. eGFR's persistently <60 mL/min signify possible Chronic Kidney Disease.    Anion gap 10 5 - 15  CBC with Differential     Status: Abnormal   Collection Time: 03/05/16  8:00 AM  Result Value Ref Range   WBC 11.4 (H) 4.0 - 10.5 K/uL   RBC 5.29 4.22 - 5.81 MIL/uL   Hemoglobin 15.3 13.0 - 17.0 g/dL   HCT 44.8 39.0 - 52.0 %   MCV 84.7 78.0 - 100.0 fL   MCH 28.9 26.0 - 34.0 pg   MCHC 34.2 30.0 - 36.0 g/dL   RDW 14.6 11.5 - 15.5 %   Platelets 183 150 - 400 K/uL   Neutrophils Relative % 76 %   Neutro Abs 8.6 (H) 1.7 - 7.7 K/uL   Lymphocytes Relative 14 %   Lymphs Abs 1.6 0.7 - 4.0 K/uL   Monocytes Relative 8 %   Monocytes Absolute 0.9 0.1 - 1.0 K/uL   Eosinophils Relative 2 %   Eosinophils Absolute 0.2 0.0 - 0.7 K/uL   Basophils Relative 0 %   Basophils Absolute 0.0 0.0 - 0.1 K/uL  CK     Status: None   Collection Time: 03/05/16  8:00 AM  Result Value Ref Range   Total CK 337 49 - 397 U/L  Troponin I     Status: Abnormal   Collection Time: 03/05/16  8:00 AM  Result Value Ref Range   Troponin I 0.03 (HH) <0.03 ng/mL    Comment: CRITICAL RESULT CALLED TO, READ BACK BY AND VERIFIED WITH: CREWS,M AT 8:45AM ON 03/05/16 BY FESTERMAN,C   TSH     Status: None   Collection Time: 03/05/16  8:00 AM  Result Value Ref Range   TSH 1.396 0.350 - 4.500 uIU/mL    Comment: Performed by a 3rd Generation assay with a functional sensitivity of <=0.01 uIU/mL.  Lactic acid, plasma     Status: None   Collection Time: 03/05/16  8:01 AM  Result Value Ref Range   Lactic Acid, Venous 1.2 0.5 - 1.9 mmol/L  Brain natriuretic peptide     Status: Abnormal   Collection Time: 03/05/16  8:01 AM  Result Value Ref Range    B Natriuretic Peptide 159.0 (H) 0.0 - 100.0 pg/mL  Urine rapid drug screen (hosp performed)     Status: None   Collection Time: 03/05/16  9:39 AM  Result Value Ref Range   Opiates NONE DETECTED NONE DETECTED   Cocaine NONE DETECTED NONE DETECTED   Benzodiazepines NONE DETECTED NONE DETECTED   Amphetamines NONE DETECTED NONE DETECTED   Tetrahydrocannabinol NONE DETECTED NONE DETECTED   Barbiturates NONE DETECTED NONE DETECTED    Comment:        DRUG SCREEN FOR MEDICAL PURPOSES ONLY.  IF CONFIRMATION IS NEEDED FOR ANY PURPOSE, NOTIFY LAB WITHIN 5 DAYS.        LOWEST DETECTABLE LIMITS FOR URINE DRUG SCREEN Drug Class       Cutoff (ng/mL) Amphetamine      1000 Barbiturate      200 Benzodiazepine   449 Tricyclics       675 Opiates          300 Cocaine          300 THC              50   Urinalysis, Routine w reflex microscopic     Status: Abnormal   Collection Time: 03/05/16  9:41 AM  Result Value Ref Range   Color, Urine YELLOW YELLOW   APPearance CLEAR CLEAR   Specific Gravity, Urine 1.020 1.005 - 1.030   pH 6.0 5.0 - 8.0   Glucose, UA NEGATIVE NEGATIVE mg/dL   Hgb urine dipstick SMALL (A) NEGATIVE   Bilirubin Urine NEGATIVE NEGATIVE   Ketones, ur TRACE (A) NEGATIVE mg/dL   Protein, ur 100 (A) NEGATIVE mg/dL   Nitrite NEGATIVE NEGATIVE   Leukocytes, UA NEGATIVE NEGATIVE  Urinalysis, Microscopic (reflex)     Status: Abnormal   Collection Time: 03/05/16  9:41 AM  Result Value Ref Range   RBC / HPF 0-5 0 - 5 RBC/hpf   WBC, UA 0-5 0 - 5 WBC/hpf   Bacteria, UA FEW (A) NONE SEEN   Squamous Epithelial / LPF NONE SEEN NONE SEEN  Lactic acid, plasma     Status: None   Collection Time: 03/05/16 10:49 AM  Result Value Ref Range   Lactic Acid, Venous 1.1 0.5 - 1.9 mmol/L  Basic metabolic panel     Status: Abnormal   Collection Time: 03/06/16  4:27 AM  Result Value Ref Range   Sodium 138 135 - 145 mmol/L   Potassium 3.5 3.5 - 5.1 mmol/L   Chloride 99 (L) 101 - 111 mmol/L    CO2 30 22 - 32 mmol/L   Glucose, Bld 136 (H) 65 - 99 mg/dL   BUN 16 6 - 20 mg/dL   Creatinine, Ser 1.37 (H) 0.61 - 1.24 mg/dL   Calcium 8.7 (L) 8.9 - 10.3 mg/dL   GFR calc non Af Amer 52 (L) >60 mL/min   GFR calc Af Amer >60 >60 mL/min    Comment: (NOTE) The eGFR has been calculated using the CKD EPI equation. This calculation has not been validated in all clinical situations. eGFR's persistently <60 mL/min signify possible Chronic Kidney Disease.    Anion gap 9 5 - 15    ABGS No results for input(s): PHART, PO2ART, TCO2, HCO3 in the last 72 hours.  Invalid input(s): PCO2 CULTURES No results found for this or any previous visit (from the past 240 hour(s)). Studies/Results: Dg Chest 2 View  Result Date: 03/05/2016 CLINICAL DATA:  Ankle swelling, fell backwards on the side of the road striking his head on the ice, preceded by  BILATERAL leg swelling and redness, history hypertension, heart murmur, diabetes mellitus EXAM: CHEST  2 VIEW COMPARISON:  08/04/2015 FINDINGS: Borderline enlargement of cardiac silhouette. Tortuous thoracic aorta. Mediastinal contours and pulmonary vascularity otherwise normal. Lungs clear. No pulmonary infiltrate, pleural effusion or pneumothorax. Bones appear demineralized with degenerative disc disease changes at caudal aspect of thoracic spine. Surgical clips RIGHT upper quadrant likely due to cholecystectomy. IMPRESSION: No acute abnormalities. Electronically Signed   By: Lavonia Dana M.D.   On: 03/05/2016 08:27   Ct Head Wo Contrast  Result Date: 03/05/2016 CLINICAL DATA:  Fall.  Head injury EXAM: CT HEAD WITHOUT CONTRAST TECHNIQUE: Contiguous axial images were obtained from the base of the skull through the vertex without intravenous contrast. COMPARISON:  None. FINDINGS: Brain: Cerebral volume normal. Ventricle size normal. Mild hypodensity in the cerebral white matter bilaterally most compatible with chronic microvascular ischemia. Negative for acute infarct.  Negative for hemorrhage or mass. Benign-appearing calcifications in the fourth ventricle bilaterally. Vascular: No hyperdense vessel or unexpected calcification. Skull: Negative for fracture. Sinuses/Orbits: Negative Other: None IMPRESSION: No acute intracranial abnormality. Mild white matter disease likely related to chronic microvascular ischemia. Electronically Signed   By: Franchot Gallo M.D.   On: 03/05/2016 08:56   US Venous Img Lower Unilateral Left  Result Date: 03/05/2016 CLINICAL DATA:  Left lower extremity pain, erythema, and edema for 1 month. EXAM: LEFT LOWER EXTREMITY VENOUS DUPLEX ULTRASOUND TECHNIQUE: Doppler venous assessment of the left lower extremity deep venous system was performed, including characterization of spectral flow, compressibility, and phasicity. COMPARISON:  None. FINDINGS: There is complete compressibility of the left common femoral, femoral, and popliteal veins. Doppler analysis demonstrates respiratory phasicity and augmentation of flow with calf compression. No obvious superficial vein or calf vein thrombosis. Subcutaneous edema in the calf and ankle is noted. IMPRESSION: No evidence of left lower extremity DVT. Electronically Signed   By: Marybelle Killings M.D.   On: 03/05/2016 10:02    Medications:  Prior to Admission:  Prescriptions Prior to Admission  Medication Sig Dispense Refill Last Dose  . amLODipine (NORVASC) 5 MG tablet Take 5 mg by mouth daily.   03/04/2016 at Unknown time  . cholecalciferol (VITAMIN D) 1000 units tablet Take 1,000 Units by mouth daily.   03/04/2016 at Unknown time  . hydrochlorothiazide (HYDRODIURIL) 25 MG tablet Take 25 mg by mouth daily.   03/04/2016 at Unknown time  . linagliptin (TRADJENTA) 5 MG TABS tablet Take 5 mg by mouth daily.   03/04/2016 at Unknown time  . lisinopril (PRINIVIL,ZESTRIL) 10 MG tablet Take 10 mg by mouth daily.   03/04/2016 at Unknown time  . omeprazole (PRILOSEC) 20 MG capsule Take 20 mg by mouth daily.   03/04/2016  at Unknown time  . potassium chloride SA (K-DUR,KLOR-CON) 20 MEQ tablet Take 20 mEq by mouth daily.   03/04/2016 at Unknown time  . pravastatin (PRAVACHOL) 20 MG tablet Take 20 mg by mouth at bedtime.   03/04/2016 at Unknown time  . sertraline (ZOLOFT) 50 MG tablet Take 50 mg by mouth daily.   03/04/2016 at Unknown time  . vitamin C (ASCORBIC ACID) 500 MG tablet Take 500 mg by mouth daily.   03/04/2016 at Unknown time   Scheduled: . aspirin EC  81 mg Oral Daily  . cefTRIAXone (ROCEPHIN)  IV  2 g Intravenous Q24H  . cholecalciferol  1,000 Units Oral Daily  . enoxaparin (LOVENOX) injection  40 mg Subcutaneous Q24H  . furosemide  40 mg Intravenous BID  .  linagliptin  5 mg Oral Daily  . lisinopril  10 mg Oral Daily  . pantoprazole  40 mg Oral Daily  . pneumococcal 23 valent vaccine  0.5 mL Intramuscular Tomorrow-1000  . potassium chloride  40 mEq Oral Daily  . pravastatin  20 mg Oral QHS  . sertraline  50 mg Oral Daily  . sodium chloride flush  3 mL Intravenous Q12H  . vitamin C  500 mg Oral Daily   Continuous:  TXM:[id number], guaiFENesin-dextromethorphan  Assesment: He has cellulitis of his legs. That's being treated with antibiotics. He has bipolar 1 disorder and has made homicidal and suicidal threats. He is on no medication at home and has discontinued seeing his psychiatrist. He has been involuntarily committed which is appropriate. Principal Problem:   Cellulitis and abscess of foot Active Problems:   Bipolar 1 disorder (HCC)   DM (diabetes mellitus) ([place])   HTN (hypertension)   Involuntary commitment   Cellulitis    Plan: Continue treatments. Seek psychiatric consultation    LOS: 1 day   Calene Paradiso L 03/06/2016, 8:40 AM

## 2016-03-06 NOTE — BH Assessment (Signed)
Patient has been accepted to [facility].  Accepting physician is Dr. Jamse Arn Call report to [phone] ext [phone] Representative was [facility] Patient can be transported after 7am tomorrow on 1/20  ER Staff, nurse Joseph Art, is aware

## 2016-03-06 NOTE — Clinical Social Work Note (Addendum)
Pt referred due to IVC. CSW confirmed IVC paperwork is on chart. Pt has been assessed by TTS with recommendation for gero-psych. Awaiting medical stability and placement. CSW spoke with pt's POA, Denise regarding recommendation.   Benay Pike, [place]

## 2016-03-07 DIAGNOSIS — F319 Bipolar disorder, unspecified: Secondary | ICD-10-CM | POA: Diagnosis not present

## 2016-03-07 DIAGNOSIS — Z23 Encounter for immunization: Secondary | ICD-10-CM | POA: Diagnosis not present

## 2016-03-07 DIAGNOSIS — I5033 Acute on chronic diastolic (congestive) heart failure: Secondary | ICD-10-CM | POA: Diagnosis present

## 2016-03-07 DIAGNOSIS — I498 Other specified cardiac arrhythmias: Secondary | ICD-10-CM | POA: Diagnosis not present

## 2016-03-07 DIAGNOSIS — R410 Disorientation, unspecified: Secondary | ICD-10-CM | POA: Diagnosis not present

## 2016-03-07 DIAGNOSIS — J410 Simple chronic bronchitis: Secondary | ICD-10-CM | POA: Diagnosis not present

## 2016-03-07 DIAGNOSIS — F29 Unspecified psychosis not due to a substance or known physiological condition: Secondary | ICD-10-CM | POA: Diagnosis not present

## 2016-03-07 DIAGNOSIS — G478 Other sleep disorders: Secondary | ICD-10-CM | POA: Diagnosis not present

## 2016-03-07 DIAGNOSIS — F600 Paranoid personality disorder: Secondary | ICD-10-CM | POA: Diagnosis not present

## 2016-03-07 DIAGNOSIS — G894 Chronic pain syndrome: Secondary | ICD-10-CM | POA: Diagnosis not present

## 2016-03-07 DIAGNOSIS — W000XXA Fall on same level due to ice and snow, initial encounter: Secondary | ICD-10-CM | POA: Diagnosis not present

## 2016-03-07 LAB — URINE CULTURE: Culture: NO GROWTH

## 2016-03-07 LAB — BASIC METABOLIC PANEL
ANION GAP: 10 (ref 5–15)
BUN: 20 mg/dL (ref 6–20)
CO2: 26 mmol/L (ref 22–32)
Calcium: 8.7 mg/dL — ABNORMAL LOW (ref 8.9–10.3)
Chloride: 100 mmol/L — ABNORMAL LOW (ref 101–111)
Creatinine, Ser: 1.28 mg/dL — ABNORMAL HIGH (ref 0.61–1.24)
GFR, EST NON AFRICAN AMERICAN: 57 mL/min — AB (ref >60)
Glucose, Bld: 148 mg/dL — ABNORMAL HIGH (ref 65–99)
POTASSIUM: 3.3 mmol/L — AB (ref 3.5–5.1)
SODIUM: 136 mmol/L (ref 135–145)

## 2016-03-07 LAB — POTASSIUM: POTASSIUM: 3.6 mmol/L (ref 3.5–5.1)

## 2016-03-07 MED ORDER — GUAIFENESIN-DM 100-10 MG/5ML PO SYRP: 5.0000 mL | ORAL_SOLUTION | ORAL | 0 refills | Status: DC | PRN

## 2016-03-07 MED ORDER — SODIUM CHLORIDE 0.9% FLUSH: 3.0000 mL | Freq: Two times a day (BID) | INTRAVENOUS | Status: DC

## 2016-03-07 MED ORDER — ENOXAPARIN SODIUM 40 MG/0.4ML ~~LOC~~ SOLN: 40.0000 mg | SUBCUTANEOUS | Status: DC

## 2016-03-07 MED ORDER — POTASSIUM CHLORIDE CRYS ER 20 MEQ PO TBCR: 20.0000 meq | EXTENDED_RELEASE_TABLET | Freq: Two times a day (BID) | ORAL | Status: DC

## 2016-03-07 MED ORDER — CEFUROXIME AXETIL 250 MG PO TABS
500.0000 mg | ORAL_TABLET | Freq: Two times a day (BID) | ORAL | Status: DC
  Administered 2016-03-07: 500 mg via ORAL
  Filled 2016-03-07: qty 2

## 2016-03-07 MED ORDER — ASPIRIN 81 MG PO TBEC: 81.0000 mg | DELAYED_RELEASE_TABLET | Freq: Every day | ORAL | Status: DC

## 2016-03-07 MED ORDER — FUROSEMIDE 40 MG PO TABS: 40.0000 mg | ORAL_TABLET | Freq: Every day | ORAL | Status: DC

## 2016-03-07 MED ORDER — CEFUROXIME AXETIL 500 MG PO TABS: 500.0000 mg | ORAL_TABLET | Freq: Two times a day (BID) | ORAL | Status: DC

## 2016-03-07 MED ORDER — POTASSIUM CHLORIDE CRYS ER 20 MEQ PO TBCR
40.0000 meq | EXTENDED_RELEASE_TABLET | Freq: Three times a day (TID) | ORAL | Status: DC
  Administered 2016-03-07: 40 meq via ORAL
  Filled 2016-03-07: qty 2

## 2016-03-07 MED ORDER — ACETAMINOPHEN 325 MG PO TABS: 650.0000 mg | ORAL_TABLET | Freq: Four times a day (QID) | ORAL | Status: DC | PRN

## 2016-03-07 MED ORDER — POTASSIUM CHLORIDE CRYS ER 20 MEQ PO TBCR
40.0000 meq | EXTENDED_RELEASE_TABLET | Freq: Once | ORAL | Status: AC
  Administered 2016-03-07: 40 meq via ORAL
  Filled 2016-03-07: qty 2

## 2016-03-07 MED ORDER — FUROSEMIDE 40 MG PO TABS
40.0000 mg | ORAL_TABLET | Freq: Every day | ORAL | Status: DC
  Administered 2016-03-07: 40 mg via ORAL
  Filled 2016-03-07: qty 1

## 2016-03-07 NOTE — Discharge Summary (Signed)
Physician Discharge Summary  Patient ID: CASMERE HOLLENBECK MRN: [id number] DOB/AGE: 25-Dec-1950 66 y.o. Primary Care Physician:Riann Oman L, MD Admit date: 03/05/2016 Discharge date: 03/07/2016    Discharge Diagnoses:   Principal Problem:   Cellulitis and abscess of foot Active Problems:   Bipolar 1 disorder ([place])   DM (diabetes mellitus) ([place])   HTN (hypertension)   Involuntary commitment   Cellulitis   Acute on chronic diastolic heart failure (HCC) Hypokalemia  Allergies as of 03/07/2016      Reactions   Codeine Nausea And Vomiting   Morphine And Related Nausea And Vomiting      Medication List    STOP taking these medications   amLODipine 5 MG tablet Commonly known as:  NORVASC   hydrochlorothiazide 25 MG tablet Commonly known as:  HYDRODIURIL     TAKE these medications   acetaminophen 325 MG tablet Commonly known as:  TYLENOL Take 2 tablets (650 mg total) by mouth every 6 (six) hours as needed (gen body pain).   aspirin 81 MG EC tablet Take 1 tablet (81 mg total) by mouth daily.   cefUROXime 500 MG tablet Commonly known as:  CEFTIN Take 1 tablet (500 mg total) by mouth 2 (two) times daily with a meal.   cholecalciferol 1000 units tablet Commonly known as:  VITAMIN D Take 1,000 Units by mouth daily.   enoxaparin 40 MG/0.4ML injection Commonly known as:  LOVENOX Inject 0.4 mLs (40 mg total) into the skin daily.   furosemide 40 MG tablet Commonly known as:  LASIX Take 1 tablet (40 mg total) by mouth daily.   guaiFENesin-dextromethorphan 100-10 MG/5ML syrup Commonly known as:  ROBITUSSIN DM Take 5 mLs by mouth every 4 (four) hours as needed for cough.   linagliptin 5 MG Tabs tablet Commonly known as:  TRADJENTA Take 5 mg by mouth daily.   lisinopril 10 MG tablet Commonly known as:  PRINIVIL,ZESTRIL Take 10 mg by mouth daily.   omeprazole 20 MG capsule Commonly known as:  PRILOSEC Take 20 mg by mouth daily.   potassium chloride SA 20 MEQ  tablet Commonly known as:  K-DUR,KLOR-CON Take 1 tablet (20 mEq total) by mouth 2 (two) times daily. What changed:  when to take this   pravastatin 20 MG tablet Commonly known as:  PRAVACHOL Take 20 mg by mouth at bedtime.   sertraline 50 MG tablet Commonly known as:  ZOLOFT Take 50 mg by mouth daily.   sodium chloride flush 0.9 % Soln Commonly known as:  NS Inject 3 mLs into the vein every 12 (twelve) hours.   vitamin C 500 MG tablet Commonly known as:  ASCORBIC ACID Take 500 mg by mouth daily.       Discharged Condition:Improved    Consults: Tele-psychiatry  Significant Diagnostic Studies: Dg Chest 2 View  Result Date: 03/05/2016 CLINICAL DATA:  Ankle swelling, fell backwards on the side of the road striking his head on the ice, preceded by BILATERAL leg swelling and redness, history hypertension, heart murmur, diabetes mellitus EXAM: CHEST  2 VIEW COMPARISON:  08/04/2015 FINDINGS: Borderline enlargement of cardiac silhouette. Tortuous thoracic aorta. Mediastinal contours and pulmonary vascularity otherwise normal. Lungs clear. No pulmonary infiltrate, pleural effusion or pneumothorax. Bones appear demineralized with degenerative disc disease changes at caudal aspect of thoracic spine. Surgical clips RIGHT upper quadrant likely due to cholecystectomy. IMPRESSION: No acute abnormalities. Electronically Signed   By: Lavonia Dana M.D.   On: 03/05/2016 08:27   Dg Ankle Complete Left  Result  Date: 02/28/2016 CLINICAL DATA:  Left foot and ankle pain EXAM: LEFT ANKLE COMPLETE - 3+ VIEW; LEFT FOOT - COMPLETE 3+ VIEW COMPARISON:  None. FINDINGS: There is incompletely visualized heterotopic ossification anterior to the left tibia. This may be secondary to the reported remote left leg fracture. There is no acute fracture or dislocation. The ankle mortise is approximated. The joint spaces of the foot are unremarkable. No radiopaque foreign body. There is marked circumferential soft tissue  swelling at the left ankle. No ankle effusion. IMPRESSION: 1. Circumferential soft tissue swelling of the left ankle without acute fracture or dislocation. 2. Incompletely visualized heterotopic ossification anterior to the left tibia may be a sequela of remote trauma. Electronically Signed   By: Ulyses Jarred M.D.   On: 02/28/2016 05:09   Dg Ankle Complete Right  Result Date: 02/28/2016 CLINICAL DATA:  Pain and swelling of both feet and ankles. EXAM: RIGHT FOOT COMPLETE - 3+ VIEW; RIGHT ANKLE - COMPLETE 3+ VIEW COMPARISON:  None. FINDINGS: There is no evidence of fracture or dislocation. There is no evidence of arthropathy or other focal bone abnormality. Soft tissues are unremarkable. IMPRESSION: No acute fracture or dislocation of the right foot or ankle. Electronically Signed   By: Ulyses Jarred M.D.   On: 02/28/2016 05:07   Ct Head Wo Contrast  Result Date: 03/05/2016 CLINICAL DATA:  Fall.  Head injury EXAM: CT HEAD WITHOUT CONTRAST TECHNIQUE: Contiguous axial images were obtained from the base of the skull through the vertex without intravenous contrast. COMPARISON:  None. FINDINGS: Brain: Cerebral volume normal. Ventricle size normal. Mild hypodensity in the cerebral white matter bilaterally most compatible with chronic microvascular ischemia. Negative for acute infarct. Negative for hemorrhage or mass. Benign-appearing calcifications in the fourth ventricle bilaterally. Vascular: No hyperdense vessel or unexpected calcification. Skull: Negative for fracture. Sinuses/Orbits: Negative Other: None IMPRESSION: No acute intracranial abnormality. Mild white matter disease likely related to chronic microvascular ischemia. Electronically Signed   By: Franchot Gallo M.D.   On: 03/05/2016 08:56   US Venous Img Lower Unilateral Left  Result Date: 03/05/2016 CLINICAL DATA:  Left lower extremity pain, erythema, and edema for 1 month. EXAM: LEFT LOWER EXTREMITY VENOUS DUPLEX ULTRASOUND TECHNIQUE: Doppler  venous assessment of the left lower extremity deep venous system was performed, including characterization of spectral flow, compressibility, and phasicity. COMPARISON:  None. FINDINGS: There is complete compressibility of the left common femoral, femoral, and popliteal veins. Doppler analysis demonstrates respiratory phasicity and augmentation of flow with calf compression. No obvious superficial vein or calf vein thrombosis. Subcutaneous edema in the calf and ankle is noted. IMPRESSION: No evidence of left lower extremity DVT. Electronically Signed   By: Marybelle Killings M.D.   On: 03/05/2016 10:02   Dg Foot Complete Left  Result Date: 02/28/2016 CLINICAL DATA:  Left foot and ankle pain EXAM: LEFT ANKLE COMPLETE - 3+ VIEW; LEFT FOOT - COMPLETE 3+ VIEW COMPARISON:  None. FINDINGS: There is incompletely visualized heterotopic ossification anterior to the left tibia. This may be secondary to the reported remote left leg fracture. There is no acute fracture or dislocation. The ankle mortise is approximated. The joint spaces of the foot are unremarkable. No radiopaque foreign body. There is marked circumferential soft tissue swelling at the left ankle. No ankle effusion. IMPRESSION: 1. Circumferential soft tissue swelling of the left ankle without acute fracture or dislocation. 2. Incompletely visualized heterotopic ossification anterior to the left tibia may be a sequela of remote trauma. Electronically Signed  By: Ulyses Jarred M.D.   On: 02/28/2016 05:09   Dg Foot Complete Right  Result Date: 02/28/2016 CLINICAL DATA:  Pain and swelling of both feet and ankles. EXAM: RIGHT FOOT COMPLETE - 3+ VIEW; RIGHT ANKLE - COMPLETE 3+ VIEW COMPARISON:  None. FINDINGS: There is no evidence of fracture or dislocation. There is no evidence of arthropathy or other focal bone abnormality. Soft tissues are unremarkable. IMPRESSION: No acute fracture or dislocation of the right foot or ankle. Electronically Signed   By: Ulyses Jarred M.D.   On: 02/28/2016 05:07    Lab Results: Basic Metabolic Panel:  Recent Labs  03/06/16 0427 03/07/16 0653  NA 138 136  K 3.5 3.3*  CL 99* 100*  CO2 30 26  GLUCOSE 136* 148*  BUN 16 20  CREATININE 1.37* 1.28*  CALCIUM 8.7* 8.7*   Liver Function Tests:  Recent Labs  03/05/16 0800  AST 35  ALT 41  ALKPHOS 95  BILITOT 1.0  PROT 7.1  ALBUMIN 3.6     CBC:  Recent Labs  03/05/16 0800  WBC 11.4*  NEUTROABS 8.6*  HGB 15.3  HCT 44.8  MCV 84.7  PLT 183    Recent Results (from the past 240 hour(s))  Urine culture     Status: None   Collection Time: 03/05/16  9:40 AM  Result Value Ref Range Status   Specimen Description URINE, RANDOM  Final   Special Requests NONE  Final   Culture   Final    NO GROWTH Performed at [facility], [address], [place], [place] [address]    Report Status 03/07/2016 FINAL  Final     Hospital Course: This is a 66 year old he's had a lot of aberrant behavior over the last several months. He eventually came to the emergency department where he was felt to have cellulitis but also felt to have behavioral issues. He was engaged in a physical altercation with police officers in the emergency department and was involuntarily committed. He was brought into the hospital given medications treated for cellulitis and improved. His mental status is essentially unchanged. He still has pressure of speech and is manic. He's going to be transferred to a inpatient psychiatric institution  Discharge Exam: Blood pressure 128/87, pulse 94, temperature 98.2 F (36.8 C), temperature source Oral, resp. rate 20, height '5\' 4"'$  (1.626 m), weight 84.7 kg (186 lb 11.7 oz), SpO2 98 %. He's awake and alert. Agitated at times but sleepy now. His legs are better. He still has some erythema but this could be treated with oral medications now.  Disposition: Transferred to inpatient psychiatry. He will be on Lasix daily for his fluid and he'll be on  Ceftin 500 mg twice a day for 7 more days. He should have basic metabolic profile in about 2 days to make sure his potassium is okay.       Signed: Mikaylee Arseneau L   03/07/2016, 9:32 AM

## 2016-03-07 NOTE — Progress Notes (Signed)
Subjective: He is calm now. He had been very agitated. His legs look better. No new complaints.  Objective: Vital signs in last 24 hours: Temp:  [97.6 F (36.4 C)-98.2 F (36.8 C)] 98.2 F (36.8 C) (01/20 0643) Pulse Rate:  [94-100] 94 (01/20 0643) Resp:  [18-20] 20 (01/20 0643) BP: (108-128)/(75-87) 128/87 (01/20 0643) SpO2:  [98 %-100 %] 98 % (01/20 8469) Weight change:  Last BM Date: 03/04/16  Intake/Output from previous day: 01/19 0701 - 01/20 0700 In: 530 [P.O.:480; IV Piggyback:50] Out: -   PHYSICAL EXAM General appearance: Sleeping now. Resp: clear to auscultation bilaterally Cardio: regular rate and rhythm, S1, S2 normal, no murmur, click, rub or gallop GI: soft, non-tender; bowel sounds normal; no masses,  no organomegaly Extremities: Extremities improved. He still has some edema and some cellulitis changes Skin as above.  Lab Results:  Results for orders placed or performed during the hospital encounter of 03/05/16 (from the past 48 hour(s))  Urine rapid drug screen (hosp performed)     Status: None   Collection Time: 03/05/16  9:39 AM  Result Value Ref Range   Opiates NONE DETECTED NONE DETECTED   Cocaine NONE DETECTED NONE DETECTED   Benzodiazepines NONE DETECTED NONE DETECTED   Amphetamines NONE DETECTED NONE DETECTED   Tetrahydrocannabinol NONE DETECTED NONE DETECTED   Barbiturates NONE DETECTED NONE DETECTED    Comment:        DRUG SCREEN FOR MEDICAL PURPOSES ONLY.  IF CONFIRMATION IS NEEDED FOR ANY PURPOSE, NOTIFY LAB WITHIN 5 DAYS.        LOWEST DETECTABLE LIMITS FOR URINE DRUG SCREEN Drug Class       Cutoff (ng/mL) Amphetamine      1000 Barbiturate      200 Benzodiazepine   629 Tricyclics       528 Opiates          300 Cocaine          300 THC              50   Urine culture     Status: None   Collection Time: 03/05/16  9:40 AM  Result Value Ref Range   Specimen Description URINE, RANDOM    Special Requests NONE    Culture      NO  GROWTH Performed at [facility], [place] [address], [place], [place] [address]    Report Status 03/07/2016 FINAL   Urinalysis, Routine w reflex microscopic     Status: Abnormal   Collection Time: 03/05/16  9:41 AM  Result Value Ref Range   Color, Urine YELLOW YELLOW   APPearance CLEAR CLEAR   Specific Gravity, Urine 1.020 1.005 - 1.030   pH 6.0 5.0 - 8.0   Glucose, UA NEGATIVE NEGATIVE mg/dL   Hgb urine dipstick SMALL (A) NEGATIVE   Bilirubin Urine NEGATIVE NEGATIVE   Ketones, ur TRACE (A) NEGATIVE mg/dL   Protein, ur 100 (A) NEGATIVE mg/dL   Nitrite NEGATIVE NEGATIVE   Leukocytes, UA NEGATIVE NEGATIVE  Urinalysis, Microscopic (reflex)     Status: Abnormal   Collection Time: 03/05/16  9:41 AM  Result Value Ref Range   RBC / HPF 0-5 0 - 5 RBC/hpf   WBC, UA 0-5 0 - 5 WBC/hpf   Bacteria, UA FEW (A) NONE SEEN   Squamous Epithelial / LPF NONE SEEN NONE SEEN  Lactic acid, plasma     Status: None   Collection Time: 03/05/16 10:49 AM  Result Value Ref Range  Lactic Acid, Venous 1.1 0.5 - 1.9 mmol/L  Basic metabolic panel     Status: Abnormal   Collection Time: 03/06/16  4:27 AM  Result Value Ref Range   Sodium 138 135 - 145 mmol/L   Potassium 3.5 3.5 - 5.1 mmol/L   Chloride 99 (L) 101 - 111 mmol/L   CO2 30 22 - 32 mmol/L   Glucose, Bld 136 (H) 65 - 99 mg/dL   BUN 16 6 - 20 mg/dL   Creatinine, Ser 1.37 (H) 0.61 - 1.24 mg/dL   Calcium 8.7 (L) 8.9 - 10.3 mg/dL   GFR calc non Af Amer 52 (L) >60 mL/min   GFR calc Af Amer >60 >60 mL/min    Comment: (NOTE) The eGFR has been calculated using the CKD EPI equation. This calculation has not been validated in all clinical situations. eGFR's persistently <60 mL/min signify possible Chronic Kidney Disease.    Anion gap 9 5 - 15  Basic metabolic panel     Status: Abnormal   Collection Time: 03/07/16  6:53 AM  Result Value Ref Range   Sodium 136 135 - 145 mmol/L   Potassium 3.3 (L) 3.5 - 5.1 mmol/L   Chloride 100 (L) 101 - 111  mmol/L   CO2 26 22 - 32 mmol/L   Glucose, Bld 148 (H) 65 - 99 mg/dL   BUN 20 6 - 20 mg/dL   Creatinine, Ser 1.28 (H) 0.61 - 1.24 mg/dL   Calcium 8.7 (L) 8.9 - 10.3 mg/dL   GFR calc non Af Amer 57 (L) >60 mL/min   GFR calc Af Amer >60 >60 mL/min    Comment: (NOTE) The eGFR has been calculated using the CKD EPI equation. This calculation has not been validated in all clinical situations. eGFR's persistently <60 mL/min signify possible Chronic Kidney Disease.    Anion gap 10 5 - 15    ABGS No results for input(s): PHART, PO2ART, TCO2, HCO3 in the last 72 hours.  Invalid input(s): PCO2 CULTURES Recent Results (from the past 240 hour(s))  Urine culture     Status: None   Collection Time: 03/05/16  9:40 AM  Result Value Ref Range Status   Specimen Description URINE, RANDOM  Final   Special Requests NONE  Final   Culture   Final    NO GROWTH Performed at [facility], [address] [place], [place] [address]    Report Status 03/07/2016 FINAL  Final   Studies/Results: US Venous Img Lower Unilateral Left  Result Date: 03/05/2016 CLINICAL DATA:  Left lower extremity pain, erythema, and edema for 1 month. EXAM: LEFT LOWER EXTREMITY VENOUS DUPLEX ULTRASOUND TECHNIQUE: Doppler venous assessment of the left lower extremity deep venous system was performed, including characterization of spectral flow, compressibility, and phasicity. COMPARISON:  None. FINDINGS: There is complete compressibility of the left common femoral, femoral, and popliteal veins. Doppler analysis demonstrates respiratory phasicity and augmentation of flow with calf compression. No obvious superficial vein or calf vein thrombosis. Subcutaneous edema in the calf and ankle is noted. IMPRESSION: No evidence of left lower extremity DVT. Electronically Signed   By: Marybelle Killings M.D.   On: 03/05/2016 10:02    Medications:  Prior to Admission:  Prescriptions Prior to Admission  Medication Sig Dispense Refill Last  Dose  . amLODipine (NORVASC) 5 MG tablet Take 5 mg by mouth daily.   03/04/2016 at Unknown time  . cholecalciferol (VITAMIN D) 1000 units tablet Take 1,000 Units by mouth daily.  03/04/2016 at Unknown time  . hydrochlorothiazide (HYDRODIURIL) 25 MG tablet Take 25 mg by mouth daily.   03/04/2016 at Unknown time  . linagliptin (TRADJENTA) 5 MG TABS tablet Take 5 mg by mouth daily.   03/04/2016 at Unknown time  . lisinopril (PRINIVIL,ZESTRIL) 10 MG tablet Take 10 mg by mouth daily.   03/04/2016 at Unknown time  . omeprazole (PRILOSEC) 20 MG capsule Take 20 mg by mouth daily.   03/04/2016 at Unknown time  . potassium chloride SA (K-DUR,KLOR-CON) 20 MEQ tablet Take 20 mEq by mouth daily.   03/04/2016 at Unknown time  . pravastatin (PRAVACHOL) 20 MG tablet Take 20 mg by mouth at bedtime.   03/04/2016 at Unknown time  . sertraline (ZOLOFT) 50 MG tablet Take 50 mg by mouth daily.   03/04/2016 at Unknown time  . vitamin C (ASCORBIC ACID) 500 MG tablet Take 500 mg by mouth daily.   03/04/2016 at Unknown time   Scheduled: . aspirin EC  81 mg Oral Daily  . cefUROXime  500 mg Oral BID WC  . cholecalciferol  1,000 Units Oral Daily  . enoxaparin (LOVENOX) injection  40 mg Subcutaneous Q24H  . furosemide  40 mg Oral Daily  . linagliptin  5 mg Oral Daily  . lisinopril  10 mg Oral Daily  . pantoprazole  40 mg Oral Daily  . potassium chloride  40 mEq Oral TID  . potassium chloride  40 mEq Oral Once  . pravastatin  20 mg Oral QHS  . sertraline  50 mg Oral Daily  . sodium chloride flush  3 mL Intravenous Q12H  . vitamin C  500 mg Oral Daily   Continuous:  KGY:[id number], guaiFENesin-dextromethorphan  Assesment: He was admitted with cellulitis and abscess of his foot. This is improving. He has bipolar 1 disorder and has been involuntarily committed. I'm going to switch his meds to oral. His potassium is low and that will be replaced. There is potential that he could go to the psychiatric inpatient facility today  depending on how his potassium does Principal Problem:   Cellulitis and abscess of foot Active Problems:   Bipolar 1 disorder ([place])   DM (diabetes mellitus) ([place])   HTN (hypertension)   Involuntary commitment   Cellulitis    Plan: As above    LOS: 2 days   Amear Strojny L 03/07/2016, 9:26 AM

## 2016-03-07 NOTE — Progress Notes (Signed)
Patient being transported to [facility] office by [place] [employer]. So he can be transported to [facility]. Report has been called and facility made aware.

## 2016-03-07 NOTE — Progress Notes (Signed)
Verbal telephone order given by Luan Pulling, MD stating that patient is medically clear to transport to facility.

## 2016-03-07 NOTE — Progress Notes (Signed)
0071 03/07/16 Spoke with Kirkland Hun, Master Clinician at [facility] reference [name]'s IVC papers.  Ms. [name] requested to have IVC papers faxed to her at [phone]. Papers were faxed, confirmation of fax successfully going through is in patients file.  [name]'s direct number is [phone]. [name] has been given a bed assignment at their facility, pending medical clearance from Dr. Luan Pulling, MD

## 2016-03-10 DIAGNOSIS — L03116 Cellulitis of left lower limb: Secondary | ICD-10-CM | POA: Diagnosis present

## 2016-03-10 DIAGNOSIS — F310 Bipolar disorder, current episode hypomanic: Secondary | ICD-10-CM | POA: Diagnosis not present

## 2016-03-10 DIAGNOSIS — R509 Fever, unspecified: Secondary | ICD-10-CM | POA: Diagnosis not present

## 2016-03-10 DIAGNOSIS — E7800 Pure hypercholesterolemia, unspecified: Secondary | ICD-10-CM | POA: Diagnosis present

## 2016-03-10 DIAGNOSIS — E1140 Type 2 diabetes mellitus with diabetic neuropathy, unspecified: Secondary | ICD-10-CM | POA: Diagnosis not present

## 2016-03-10 DIAGNOSIS — Z79899 Other long term (current) drug therapy: Secondary | ICD-10-CM | POA: Diagnosis not present

## 2016-03-10 DIAGNOSIS — I2510 Atherosclerotic heart disease of native coronary artery without angina pectoris: Secondary | ICD-10-CM | POA: Diagnosis present

## 2016-03-10 DIAGNOSIS — N452 Orchitis: Secondary | ICD-10-CM | POA: Diagnosis not present

## 2016-03-10 DIAGNOSIS — R0602 Shortness of breath: Secondary | ICD-10-CM | POA: Diagnosis not present

## 2016-03-10 DIAGNOSIS — J449 Chronic obstructive pulmonary disease, unspecified: Secondary | ICD-10-CM | POA: Diagnosis present

## 2016-03-10 DIAGNOSIS — Z7984 Long term (current) use of oral hypoglycemic drugs: Secondary | ICD-10-CM | POA: Diagnosis not present

## 2016-03-10 DIAGNOSIS — R0600 Dyspnea, unspecified: Secondary | ICD-10-CM | POA: Diagnosis not present

## 2016-03-10 DIAGNOSIS — I10 Essential (primary) hypertension: Secondary | ICD-10-CM | POA: Diagnosis present

## 2016-03-10 DIAGNOSIS — E13628 Other specified diabetes mellitus with other skin complications: Secondary | ICD-10-CM | POA: Diagnosis not present

## 2016-03-10 DIAGNOSIS — A419 Sepsis, unspecified organism: Secondary | ICD-10-CM | POA: Diagnosis not present

## 2016-03-10 DIAGNOSIS — R45850 Homicidal ideations: Secondary | ICD-10-CM | POA: Diagnosis present

## 2016-03-10 DIAGNOSIS — R2243 Localized swelling, mass and lump, lower limb, bilateral: Secondary | ICD-10-CM | POA: Diagnosis not present

## 2016-03-10 DIAGNOSIS — L03115 Cellulitis of right lower limb: Secondary | ICD-10-CM | POA: Diagnosis not present

## 2016-03-10 DIAGNOSIS — R0902 Hypoxemia: Secondary | ICD-10-CM | POA: Diagnosis present

## 2016-03-10 DIAGNOSIS — R0609 Other forms of dyspnea: Secondary | ICD-10-CM | POA: Diagnosis not present

## 2016-03-10 DIAGNOSIS — K219 Gastro-esophageal reflux disease without esophagitis: Secondary | ICD-10-CM | POA: Diagnosis present

## 2016-03-10 DIAGNOSIS — E118 Type 2 diabetes mellitus with unspecified complications: Secondary | ICD-10-CM | POA: Diagnosis not present

## 2016-03-10 DIAGNOSIS — F319 Bipolar disorder, unspecified: Secondary | ICD-10-CM | POA: Diagnosis present

## 2016-03-10 DIAGNOSIS — Z7951 Long term (current) use of inhaled steroids: Secondary | ICD-10-CM | POA: Diagnosis not present

## 2016-03-10 DIAGNOSIS — L03032 Cellulitis of left toe: Secondary | ICD-10-CM | POA: Diagnosis not present

## 2016-03-10 DIAGNOSIS — J439 Emphysema, unspecified: Secondary | ICD-10-CM | POA: Diagnosis not present

## 2016-03-18 DIAGNOSIS — F319 Bipolar disorder, unspecified: Secondary | ICD-10-CM | POA: Diagnosis not present

## 2016-03-18 DIAGNOSIS — I9589 Other hypotension: Secondary | ICD-10-CM | POA: Diagnosis not present

## 2016-03-18 DIAGNOSIS — R8290 Unspecified abnormal findings in urine: Secondary | ICD-10-CM | POA: Diagnosis not present

## 2016-03-18 DIAGNOSIS — E559 Vitamin D deficiency, unspecified: Secondary | ICD-10-CM | POA: Diagnosis not present

## 2016-03-18 DIAGNOSIS — N189 Chronic kidney disease, unspecified: Secondary | ICD-10-CM | POA: Diagnosis not present

## 2016-03-18 DIAGNOSIS — I7389 Other specified peripheral vascular diseases: Secondary | ICD-10-CM | POA: Diagnosis not present

## 2016-03-18 DIAGNOSIS — J45998 Other asthma: Secondary | ICD-10-CM | POA: Diagnosis not present

## 2016-03-18 DIAGNOSIS — I739 Peripheral vascular disease, unspecified: Secondary | ICD-10-CM | POA: Diagnosis not present

## 2016-03-18 DIAGNOSIS — E860 Dehydration: Secondary | ICD-10-CM | POA: Diagnosis not present

## 2016-03-18 DIAGNOSIS — K5909 Other constipation: Secondary | ICD-10-CM | POA: Diagnosis not present

## 2016-03-18 DIAGNOSIS — L03116 Cellulitis of left lower limb: Secondary | ICD-10-CM | POA: Diagnosis not present

## 2016-03-18 DIAGNOSIS — E1140 Type 2 diabetes mellitus with diabetic neuropathy, unspecified: Secondary | ICD-10-CM | POA: Diagnosis not present

## 2016-03-18 DIAGNOSIS — F2089 Other schizophrenia: Secondary | ICD-10-CM | POA: Diagnosis present

## 2016-03-18 DIAGNOSIS — Z79899 Other long term (current) drug therapy: Secondary | ICD-10-CM | POA: Diagnosis not present

## 2016-03-18 DIAGNOSIS — R600 Localized edema: Secondary | ICD-10-CM | POA: Diagnosis not present

## 2016-03-18 DIAGNOSIS — J069 Acute upper respiratory infection, unspecified: Secondary | ICD-10-CM | POA: Diagnosis not present

## 2016-03-18 DIAGNOSIS — G4709 Other insomnia: Secondary | ICD-10-CM | POA: Diagnosis not present

## 2016-03-18 DIAGNOSIS — L03115 Cellulitis of right lower limb: Secondary | ICD-10-CM | POA: Diagnosis not present

## 2016-04-22 ENCOUNTER — Emergency Department: Payer: Medicare Other

## 2016-04-22 ENCOUNTER — Emergency Department
Admission: EM | Admit: 2016-04-22 | Discharge: 2016-04-23 | Disposition: A | Discharge status: Home / Self Care | Payer: Medicare Other | Attending: Emergency Medicine | Admitting: Emergency Medicine

## 2016-04-22 DIAGNOSIS — Z7984 Long term (current) use of oral hypoglycemic drugs: Secondary | ICD-10-CM | POA: Insufficient documentation

## 2016-04-22 DIAGNOSIS — I10 Essential (primary) hypertension: Secondary | ICD-10-CM | POA: Diagnosis not present

## 2016-04-22 DIAGNOSIS — R600 Localized edema: Secondary | ICD-10-CM | POA: Insufficient documentation

## 2016-04-22 DIAGNOSIS — F32A Depression, unspecified: Secondary | ICD-10-CM

## 2016-04-22 DIAGNOSIS — R45851 Suicidal ideations: Secondary | ICD-10-CM | POA: Diagnosis present

## 2016-04-22 DIAGNOSIS — F23 Brief psychotic disorder: Secondary | ICD-10-CM | POA: Diagnosis not present

## 2016-04-22 DIAGNOSIS — M7989 Other specified soft tissue disorders: Secondary | ICD-10-CM | POA: Insufficient documentation

## 2016-04-22 DIAGNOSIS — R0902 Hypoxemia: Secondary | ICD-10-CM | POA: Diagnosis not present

## 2016-04-22 DIAGNOSIS — F329 Major depressive disorder, single episode, unspecified: Secondary | ICD-10-CM | POA: Diagnosis not present

## 2016-04-22 DIAGNOSIS — E119 Type 2 diabetes mellitus without complications: Secondary | ICD-10-CM | POA: Insufficient documentation

## 2016-04-22 DIAGNOSIS — F319 Bipolar disorder, unspecified: Secondary | ICD-10-CM | POA: Diagnosis not present

## 2016-04-22 DIAGNOSIS — E1159 Type 2 diabetes mellitus with other circulatory complications: Secondary | ICD-10-CM

## 2016-04-22 DIAGNOSIS — Z79899 Other long term (current) drug therapy: Secondary | ICD-10-CM | POA: Diagnosis not present

## 2016-04-22 LAB — CBC
HCT: 39.1 % — ABNORMAL LOW (ref 40.0–52.0)
HEMOGLOBIN: 13.1 g/dL (ref 13.0–18.0)
MCH: 28.2 pg (ref 26.0–34.0)
MCHC: 33.6 g/dL (ref 32.0–36.0)
MCV: 83.8 fL (ref 80.0–100.0)
PLATELETS: 152 10*3/uL (ref 150–440)
RBC: 4.66 MIL/uL (ref 4.40–5.90)
RDW: 15 % — AB (ref 11.5–14.5)
WBC: 13 10*3/uL — ABNORMAL HIGH (ref 3.8–10.6)

## 2016-04-22 LAB — COMPREHENSIVE METABOLIC PANEL
ALK PHOS: 90 U/L (ref 38–126)
ALT: 22 U/L (ref 17–63)
ANION GAP: 7 (ref 5–15)
AST: 28 U/L (ref 15–41)
Albumin: 3.4 g/dL — ABNORMAL LOW (ref 3.5–5.0)
BUN: 25 mg/dL — ABNORMAL HIGH (ref 6–20)
CALCIUM: 8.7 mg/dL — AB (ref 8.9–10.3)
CO2: 29 mmol/L (ref 22–32)
Chloride: 103 mmol/L (ref 101–111)
Creatinine, Ser: 1.12 mg/dL (ref 0.61–1.24)
Glucose, Bld: 163 mg/dL — ABNORMAL HIGH (ref 65–99)
Potassium: 3.5 mmol/L (ref 3.5–5.1)
SODIUM: 139 mmol/L (ref 135–145)
Total Bilirubin: 0.7 mg/dL (ref 0.3–1.2)
Total Protein: 6.6 g/dL (ref 6.5–8.1)

## 2016-04-22 LAB — ETHANOL

## 2016-04-22 LAB — URINE DRUG SCREEN, QUALITATIVE (ARMC ONLY)
Amphetamines, Ur Screen: NOT DETECTED
BARBITURATES, UR SCREEN: NOT DETECTED
BENZODIAZEPINE, UR SCRN: NOT DETECTED
Cannabinoid 50 Ng, Ur ~~LOC~~: NOT DETECTED
Cocaine Metabolite,Ur ~~LOC~~: NOT DETECTED
MDMA (Ecstasy)Ur Screen: NOT DETECTED
METHADONE SCREEN, URINE: NOT DETECTED
Opiate, Ur Screen: NOT DETECTED
Phencyclidine (PCP) Ur S: NOT DETECTED
TRICYCLIC, UR SCREEN: NOT DETECTED

## 2016-04-22 LAB — ACETAMINOPHEN LEVEL

## 2016-04-22 LAB — SALICYLATE LEVEL

## 2016-04-22 NOTE — ED Triage Notes (Signed)
Per EMS - [name], patient is seeking psychiatric evaluation.  Per EMS, pt expressed SI to fire department on arrival, but has denied SI to EMS and denies SI/HI to this RN upon arrival.  Per EMS, pt was recently d/c from [facility] on Friday.  Pt BS was 195 and oral temp of 99.6 per ems.  All other vitals WNL per EMS.  Pt reports hx of bipolar disorder and possible schizophrenia, but does not recall if he's been formally diagnosed with schizophrenia.  Per EMS, pt was altered on arrival, but during ride patient became A&Ox4.  Pt prefers to be called [name].  Pt's POA is [employer], phone number [phone].

## 2016-04-22 NOTE — ED Provider Notes (Signed)
[facility] Emergency Department Provider Note  Time seen: 10:24 PM  I have reviewed the triage vital signs and the nursing notes.   HISTORY  Chief Complaint Psychiatric Evaluation    HPI [name] is a 66 y.o. male past medical history bipolar, possible schizophrenia, diabetes, hypertension, who presents to the emergency department with depression. According to the patient he states he has been feeling depressed and feels like he is going to have a "mental breakdown." He states he has not eaten in several days and is asking for food. Patient's only medical complaint is left foot pain and swelling which she states has been present for several months. Denies any fever. Patient has a history of diabetes as well as hypertension but states he is not taking any wrist medications times several months. Patient adamantly denies any suicidal ideation or homicidal ideation.    Past Medical History:  Diagnosis Date  . Arthritis   . Bipolar disorder ([place])   . Diabetes mellitus without complication ([place])    boderline  . Heart murmur 1995  . Hypertension   . Kidney stones     Patient Active Problem List   Diagnosis Date Noted  . Acute on chronic diastolic heart failure ([place]) 03/07/2016  . Cellulitis and abscess of foot 03/05/2016  . Bipolar 1 disorder ([place]) 03/05/2016  . DM (diabetes mellitus) ([place]) 03/05/2016  . HTN (hypertension) 03/05/2016  . Involuntary commitment 03/05/2016  . Cellulitis 03/05/2016    Past Surgical History:  Procedure Laterality Date  . APPENDECTOMY    . CHOLECYSTECTOMY N/A 10/20/2013   Procedure: LAPAROSCOPIC CHOLECYSTECTOMY;  Surgeon: [name], MD;  Location: [facility];  Service: General;  Laterality: N/A;  . HIP SURGERY    . KNEE SURGERY      Prior to Admission medications   Medication Sig Start Date End Date Taking? Authorizing Provider  acetaminophen (TYLENOL) 325 MG tablet Take 2 tablets (650 mg total) by mouth every 6 (six)  hours as needed (gen body pain). 03/07/16   Sinda Du, MD  amLODipine (NORVASC) 5 MG tablet Take 5 mg by mouth daily.    Historical Provider, MD  aspirin EC 81 MG EC tablet Take 1 tablet (81 mg total) by mouth daily. 03/07/16   Sinda Du, MD  cefUROXime (CEFTIN) 500 MG tablet Take 1 tablet (500 mg total) by mouth 2 (two) times daily with a meal. 03/07/16   Sinda Du, MD  cholecalciferol (VITAMIN D) 1000 units tablet Take 1,000 Units by mouth daily.    Historical Provider, MD  enoxaparin (LOVENOX) 40 MG/0.4ML injection Inject 0.4 mLs (40 mg total) into the skin daily. 03/07/16   Sinda Du, MD  furosemide (LASIX) 40 MG tablet Take 1 tablet (40 mg total) by mouth daily. 03/07/16   Sinda Du, MD  guaiFENesin-dextromethorphan (ROBITUSSIN DM) 100-10 MG/5ML syrup Take 5 mLs by mouth every 4 (four) hours as needed for cough. 03/07/16   Sinda Du, MD  linagliptin (TRADJENTA) 5 MG TABS tablet Take 5 mg by mouth daily.    Historical Provider, MD  lisinopril (PRINIVIL,ZESTRIL) 10 MG tablet Take 10 mg by mouth daily. 08/30/13   Historical Provider, MD  omeprazole (PRILOSEC) 20 MG capsule Take 20 mg by mouth daily. 09/13/13   Historical Provider, MD  potassium chloride SA (K-DUR,KLOR-CON) 20 MEQ tablet Take 1 tablet (20 mEq total) by mouth 2 (two) times daily. 03/07/16   Sinda Du, MD  pravastatin (PRAVACHOL) 20 MG tablet Take 20 mg by mouth  at bedtime. 05/07/15   Historical Provider, MD  risperiDONE (RISPERDAL) 0.5 MG tablet Take 1 tablet by mouth 3 (three) times daily. 04/17/16   Historical Provider, MD  sertraline (ZOLOFT) 50 MG tablet Take 50 mg by mouth daily.    Historical Provider, MD  sodium chloride flush (NS) 0.9 % SOLN Inject 3 mLs into the vein every 12 (twelve) hours. 03/07/16   Sinda Du, MD  venlafaxine XR (EFFEXOR-XR) 75 MG 24 hr capsule Take 1 capsule by mouth daily. 04/17/16   Historical Provider, MD  vitamin C (ASCORBIC ACID) 500 MG tablet Take 500 mg by mouth daily.     Historical Provider, MD    Allergies  Allergen Reactions  . Codeine Nausea And Vomiting  . Morphine And Related Nausea And Vomiting    No family history on file.  Social History Social History  Substance Use Topics  . Smoking status: Never Smoker  . Smokeless tobacco: Never Used  . Alcohol use No    Review of Systems Constitutional: Negative for fever. Cardiovascular: Negative for chest pain. Respiratory: Negative for shortness of breath. Gastrointestinal: Negative for abdominal pain Musculoskeletal: Left foot pain and swelling times many months. Neurological: Negative for headache 10-point ROS otherwise negative.  ____________________________________________   PHYSICAL EXAM:  VITAL SIGNS: ED Triage Vitals  Enc Vitals Group     BP 04/22/16 2123 (!) 134/97     Pulse Rate 04/22/16 2123 (!) 108     Resp 04/22/16 2123 18     Temp 04/22/16 2123 98.4 F (36.9 C)     Temp Source 04/22/16 2123 Oral     SpO2 04/22/16 2123 100 %     Weight 04/22/16 2124 159 lb (72.1 kg)     Height 04/22/16 2124 '5\' 4"'$  (1.626 m)     Head Circumference --      Peak Flow --      Pain Score 04/22/16 2124 0     Pain Loc --      Pain Edu? --      Excl. in [place]? --     Constitutional: Alert and oriented. Well appearing and in no distress. Eyes: Normal exam ENT   Head: Normocephalic and atraumatic.   Mouth/Throat: Mucous membranes are moist. Cardiovascular: Normal rate, regular rhythm. No murmur Respiratory: Normal respiratory effort without tachypnea nor retractions. Breath sounds are clear  Gastrointestinal: Soft and nontender. No distention.   Musculoskeletal: No lower extremity tenderness. Patient does have mild erythema with 1+/2+ edema of the left lower extremity. He states this has been ongoing for multiple months. States this has been evaluated previously but he has not followed up with anyone. Neurologic:  Normal speech and language. No gross focal neurologic deficits Skin:   Skin is warm, dry and intact.  Psychiatric: Mood and affect are normal.   ____________________________________________    INITIAL IMPRESSION / ASSESSMENT AND PLAN / ED COURSE  Pertinent labs & imaging results that were available during my care of the patient were reviewed by me and considered in my medical decision making (see chart for details).  Patient presents the emergency department with depression, denies alcohol or drug use. States he feels like he is going to have a mental breakdown. Denies SI or HI. Does not meet IVC criteria. Patient is asking to speak to a psychiatrist. Patient's medical workup is largely nonrevealing besides left lower extremity edema which is approximately 1+ pitting, mild erythema but nontender. No ulcerations or obvious signs of cellulitis on examination. However given unilateral  swelling we will obtain an ultrasound to rule out DVT. Patient's lab work shows a mild leukocytosis of 13,000.  Patient wishes to wait voluntarily in the emergency department overnight to speak to a psychiatrist in person tomorrow.  Ultrasound pending, patient care signed out to oncoming provider.  ____________________________________________   FINAL CLINICAL IMPRESSION(S) / ED DIAGNOSES  Depression    Harvest Dark, MD 04/22/16 2228

## 2016-04-22 NOTE — ED Notes (Signed)
Patient given a sandwich tray, ginger ale and blanket.

## 2016-04-22 NOTE — BH Assessment (Signed)
Assessment Note  [name] is an 66 y.o. male , with a history of bipolar disorder, presenting to the ED with reports of worsening depression since being released from [facility]. Pt denies taking medication for bipolar disorder. Pt reports his daughter, [name], wants to admit him to a mental institute but he refuses to.   Patient denies any SI, no plan, no intention.  Patient denies any HI or A/V hallucinations.  Patient says that "I know I'm mentally ill, but I don't have bipolar."  He said that he has been off any psychiatric medication for the last two years. He reports he used to see Dr. [name] but states "Mom messed that up for me".  He reports not being able to afford medication management with Dr. [name].  Diagnosis: Bipolar Disorder  Past Medical History:  Past Medical History:  Diagnosis Date  . Arthritis   . Bipolar disorder ([place])   . Diabetes mellitus without complication ([place])    boderline  . Heart murmur 1995  . Hypertension   . Kidney stones     Past Surgical History:  Procedure Laterality Date  . APPENDECTOMY    . CHOLECYSTECTOMY N/A 10/20/2013   Procedure: LAPAROSCOPIC CHOLECYSTECTOMY;  Surgeon: [name], MD;  Location: [facility];  Service: General;  Laterality: N/A;  . HIP SURGERY    . KNEE SURGERY      Family History: No family history on file.  Social History:  reports that he has never smoked. He has never used smokeless tobacco. He reports that he does not drink alcohol or use drugs.  Additional Social History:  Alcohol / Drug Use Pain Medications: See PTA Prescriptions: See PTA Over the Counter: See PTA History of alcohol / drug use?: No history of alcohol / drug abuse  CIWA: CIWA-Ar BP: (!) 134/97 Pulse Rate: (!) 108 COWS:    Allergies:  Allergies  Allergen Reactions  . Codeine Nausea And Vomiting  . Morphine And Related Nausea And Vomiting    Home Medications:  (Not in a hospital admission)  OB/GYN Status:  No LMP for  male patient.  General Assessment Data Location of Assessment: [facility] ED TTS Assessment: In system Is this a Tele or Face-to-Face Assessment?: Face-to-Face Is this an Initial Assessment or a Re-assessment for this encounter?: Initial Assessment Marital status: Single Maiden name: na Is patient pregnant?: No Pregnancy Status: No Living Arrangements: Alone Can pt return to current living arrangement?: Yes Admission Status: Voluntary Is patient capable of signing voluntary admission?: Yes Referral Source: Self/Family/Friend Insurance type: Medicare     Crisis Care Plan Living Arrangements: Alone Legal Guardian: Other: (self) Name of Psychiatrist: None Name of Therapist: None  Education Status Is patient currently in school?: No Current Grade: na Highest grade of school patient has completed: Some college Name of school: na Contact person: na  Risk to self with the past 6 months Suicidal Ideation: No Has patient been a risk to self within the past 6 months prior to admission? : No Suicidal Intent: No Has patient had any suicidal intent within the past 6 months prior to admission? : No Is patient at risk for suicide?: No Suicidal Plan?: No Has patient had any suicidal plan within the past 6 months prior to admission? : No Access to Means: No What has been your use of drugs/alcohol within the last 12 months?: Pt denies  Previous Attempts/Gestures: No How many times?: 0 Other Self Harm Risks: None identified Triggers for Past Attempts: None  known Intentional Self Injurious Behavior: None Family Suicide History: No Recent stressful life event(s): Financial Problems Persecutory voices/beliefs?: No Depression: No Substance abuse history and/or treatment for substance abuse?: No Suicide prevention information given to non-admitted patients: Not applicable  Risk to Others within the past 6 months Homicidal Ideation: No Does patient have any lifetime risk of violence toward  others beyond the six months prior to admission? : No Thoughts of Harm to Others: No Current Homicidal Intent: No Current Homicidal Plan: No Access to Homicidal Means: No Identified Victim: None identified History of harm to others?: No Assessment of Violence: None Noted Violent Behavior Description: None identified Does patient have access to weapons?: No Criminal Charges Pending?: No Does patient have a court date: No Is patient on probation?: No  Psychosis Hallucinations: None noted Delusions: None noted  Mental Status Report Appearance/Hygiene: In scrubs, Body odor Eye Contact: Good Motor Activity: Freedom of movement Speech: Logical/coherent, Rapid Level of Consciousness: Alert Mood: Anxious Affect: Anxious Anxiety Level: Minimal Thought Processes: Relevant Judgement: Unimpaired Orientation: Person, Time, Place, Situation Obsessive Compulsive Thoughts/Behaviors: None  Cognitive Functioning Concentration: Normal Memory: Recent Intact IQ: Average Insight: Fair Impulse Control: Fair Appetite: Fair Weight Loss: 0 Weight Gain: 0 Sleep: No Change Vegetative Symptoms: None  ADLScreening [facility] Assessment Services) Patient's cognitive ability adequate to safely complete daily activities?: Yes Patient able to express need for assistance with ADLs?: Yes Independently performs ADLs?: Yes (appropriate for developmental age)  Prior Inpatient Therapy Prior Inpatient Therapy: Yes Prior Therapy Dates: 04/2016 [facility]Provider[facility]): [facility] Reason for Treatment: depression  Prior Outpatient Therapy Prior Outpatient Therapy: Yes Prior Therapy Dates: Over 2 years ago Prior Therapy Facilty/Provider(s): Dr. [name] Reason for Treatment: med management Does patient have an ACCT team?: No Does patient have Intensive In-House Services?  : No Does patient have [facility] services? : No Does patient have P4CC services?: No  ADL Screening (condition at time of  admission) Patient's cognitive ability adequate to safely complete daily activities?: Yes Patient able to express need for assistance with ADLs?: Yes Independently performs ADLs?: Yes (appropriate for developmental age)       Abuse/Neglect Assessment (Assessment to be complete while patient is alone) Physical Abuse: Denies Verbal Abuse: Denies Sexual Abuse: Denies Exploitation of patient/patient's resources: Denies Self-Neglect: Denies Values / Beliefs Cultural Requests During Hospitalization: None Spiritual Requests During Hospitalization: None Consults Spiritual Care Consult Needed: No Social Work Consult Needed: No      Additional Information 1:1 In Past 12 Months?: No CIRT Risk: No Elopement Risk: No Does patient have medical clearance?: Yes     Disposition:  Disposition Initial Assessment Completed for this Encounter: Yes Disposition of Patient: Other dispositions (Pt to be reviewed by PA) Other disposition(s): Other (Comment) (Pending Psych MDconsult)  On Site Evaluation by:   Reviewed with Physician:    Oneita Hurt 04/22/2016 11:57 PM

## 2016-04-23 DIAGNOSIS — F319 Bipolar disorder, unspecified: Secondary | ICD-10-CM

## 2016-04-23 DIAGNOSIS — F329 Major depressive disorder, single episode, unspecified: Secondary | ICD-10-CM | POA: Diagnosis not present

## 2016-04-23 LAB — GLUCOSE, CAPILLARY: GLUCOSE-CAPILLARY: 108 mg/dL — AB (ref 65–99)

## 2016-04-23 MED ORDER — LORAZEPAM 1 MG PO TABS
1.0000 mg | ORAL_TABLET | Freq: Once | ORAL | Status: AC
  Administered 2016-04-23: 1 mg via ORAL
  Filled 2016-04-23: qty 1

## 2016-04-23 NOTE — ED Notes (Signed)
Pt given ginger ale.

## 2016-04-23 NOTE — ED Notes (Signed)
Medication retrieved from Pharm storage

## 2016-04-23 NOTE — ED Notes (Signed)
Pt given breakfast tray. Pt sitting on edge of bed eating.

## 2016-04-23 NOTE — ED Notes (Signed)
Pt is sleeping. Pt stated earlier that he took bird baths all night and did not need to take a shower today. I will check back with pt when he wakes up.

## 2016-04-23 NOTE — ED Notes (Signed)
POA [employer] calling and requesting that pt does not go back to [employer] in [place], due to travel distance. [phone]

## 2016-04-23 NOTE — ED Notes (Signed)
Gave pt ginger ale and graham crackers. Gave pt warm blanket.

## 2016-04-23 NOTE — ED Notes (Signed)
Pt washing off in bathroom and states he does not want to take a shower. If pt changes his mind he will let me know.

## 2016-04-23 NOTE — ED Provider Notes (Signed)
-----------------------------------------   5:35 PM on 04/23/2016 -----------------------------------------   Blood pressure 128/76, pulse (!) 102, temperature 99.4 F (37.4 C), temperature source Oral, resp. rate 16, height '5\' 4"'$  (1.626 m), weight 159 lb (72.1 kg), SpO2 95 %.  The patient had no acute events since last update.  Calm and cooperative at this time.  Disposition is pending Psychiatry/Behavioral Medicine team recommendations.  Patient was seen and evaluated by psychiatry and mainly has chronic issues that were felt to be stable from a psychiatric standpoint here at present and could be discharged. Patient had been medically cleared by my colleague and will be discharged for psychiatric follow-up. He should continue with all his current medications.   Daymon Larsen, MD 04/23/16 [phone]

## 2016-04-23 NOTE — ED Notes (Signed)
Pt requested valium due to nerves because he is worried about going to [place]. RN notified.

## 2016-04-23 NOTE — ED Notes (Signed)
Reviewed home medication with Dr.Schaevitz at this time no home medications ordered

## 2016-04-23 NOTE — ED Notes (Signed)
Family aware of pending discharge, will take approx 40 min till arrival

## 2016-04-23 NOTE — ED Notes (Signed)
Pt resting, even unlabored respirations, skin warm and dry, no distress

## 2016-04-23 NOTE — ED Notes (Signed)
Dr.Clapacs at bedside

## 2016-04-23 NOTE — ED Notes (Signed)
Dr. Weber Cooks in room with pt.

## 2016-04-23 NOTE — ED Notes (Signed)
Pt belongings at [employer]. Pt sleeping.

## 2016-04-23 NOTE — ED Notes (Signed)
Pt sitting in [facility] while room 22 is used for [facility]. Pt getting irritable. RN notified.

## 2016-04-23 NOTE — ED Provider Notes (Signed)
-----------------------------------------   6:52 AM on 04/23/2016 -----------------------------------------   Blood pressure 135/74, pulse 98, temperature 98.6 F (37 C), temperature source Oral, resp. rate 18, height '5\' 4"'$  (1.626 m), weight 159 lb (72.1 kg), SpO2 100 %.  The patient had no acute events since last update.  Calm and cooperative at this time.  Disposition is pending Psychiatry/Behavioral Medicine team recommendations.  LLE venous Doppler interpreted per Dr. Randel Pigg: No evidence of deep venous thrombosis.   Paulette Blanch, MD 04/23/16 [phone]

## 2016-04-23 NOTE — ED Notes (Signed)
Pt given lunch tray.

## 2016-04-23 NOTE — ED Notes (Signed)
Pt requested something for bowels. RN notified. Pt is currently lying in bed resting.

## 2016-04-23 NOTE — Consult Note (Signed)
[facility] Consult   Reason for Consult:  Consult for 66 year old man with a history of bipolar disorder who came voluntarily to the emergency room Referring Physician:  Paduchowski Patient Identification: [name] MRN:  [id number] Principal Diagnosis: Bipolar 1 disorder [facility]) Diagnosis:   Patient Active Problem List   Diagnosis Date Noted  . Acute on chronic diastolic heart failure ([place]) [I50.33] 03/07/2016  . Cellulitis and abscess of foot [L03.119, L02.619] 03/05/2016  . Bipolar 1 disorder ([place]) [F31.9] 03/05/2016  . DM (diabetes mellitus) ([place]) [E11.9] 03/05/2016  . HTN (hypertension) [I10] 03/05/2016  . Involuntary commitment [Z04.6] 03/05/2016  . Cellulitis [L03.90] 03/05/2016    Total Time spent with patient: 1 hour  Subjective:   [name] is a 66 y.o. male patient admitted with "it was my daughter in a roundabout way".  HPI:  Patient interviewed. Chart reviewed. 2 man came to the emergency room last night voluntarily. [facility] ago. Patient is a little difficult to interview. He gets his time courses of things mixed up area he tells me that since coming home from [facility] he's been sleeping fairly well which was an improvement. He did tell me that he thought he was going to have a "nervous breakdown" last night but he couldn't really describe what he meant by that. He completely denied to me any thoughts about wanting to die or wanting to hurt himself. Denied any thoughts about hurting anybody else. He denies that he's having any hallucinations. Like he is staying by himself right now. It also sounds like he may not be taking any medication for his bipolar area he is vague about what they gave him at [facility]. He does say that sometimes he gets some paranoid thoughts but doesn't report having any auditory hallucinations. Denies alcohol or drug abuse. Patient is requesting discharge home.  Social  history: 66 year old man who lives by himself now. Sounds like he used to live with his mother but probably is life circumstances changed recently. He does have a power of attorney but does not appear to have a guardian.  Medical history: Diabetes high blood pressure and a fairly recent cellulitis to at least one of his feet.  Substance abuse history: He denies any current or past alcohol or drug abuse  Past Psychiatric History: Patient says that he has had psychiatric hospitalizations in the past. He used to go to see a psychiatrist regularly in his home [place]. Used to be on Abilify but says he's been off medicines for over a year. Denies any past history of suicide. He does describe some behaviors of getting into aggressive fights when he was at [facility] and looking through the old chart it looks like there've been concerns in the past about him threatening people.  Risk to Self: Suicidal Ideation: No Suicidal Intent: No Is patient at risk for suicide?: No Suicidal Plan?: No Access to Means: No What has been your use of drugs/alcohol within the last 12 months?: Pt denies  How many times?: 0 Other Self Harm Risks: None identified Triggers for Past Attempts: None known Intentional Self Injurious Behavior: None Risk to Others: Homicidal Ideation: No Thoughts of Harm to Others: No Current Homicidal Intent: No Current Homicidal Plan: No Access to Homicidal Means: No Identified Victim: None identified History of harm to others?: No Assessment of Violence: None Noted Violent Behavior Description: None identified Does patient have access to weapons?: No Criminal Charges Pending?: No Does patient  have a court date: No Prior Inpatient Therapy: Prior Inpatient Therapy: Yes Prior Therapy Dates: 04/2016 [facility] Facilty[facility]Provider(s): [facility] Reason for Treatment: depression Prior Outpatient Therapy: Prior Outpatient Therapy: Yes Prior Therapy Dates: Over 2 years ago Prior Therapy  Facilty/Provider(s): Dr. [name] Reason for Treatment: med management Does patient have an ACCT team?: No Does patient have Intensive In-House Services?  : No Does patient have [facility] services? : No Does patient have P4CC services?: No  Past Medical History:  Past Medical History:  Diagnosis Date  . Arthritis   . Bipolar disorder ([place])   . Diabetes mellitus without complication ([place])    boderline  . Heart murmur 1995  . Hypertension   . Kidney stones     Past Surgical History:  Procedure Laterality Date  . APPENDECTOMY    . CHOLECYSTECTOMY N/A 10/20/2013   Procedure: LAPAROSCOPIC CHOLECYSTECTOMY;  Surgeon: [name], MD;  Location: [facility];  Service: General;  Laterality: N/A;  . HIP SURGERY    . KNEE SURGERY     Family History: No family history on file. Family Psychiatric  History: He says his father had schizophrenia. Social History:  History  Alcohol Use No     History  Drug Use No    Social History   Social History  . Marital status: Divorced    Spouse name: N/A  . Number of children: N/A  . Years of education: N/A   Social History Main Topics  . Smoking status: Never Smoker  . Smokeless tobacco: Never Used  . Alcohol use No  . Drug use: No  . Sexual activity: Not Asked   Other Topics Concern  . None   Social History Narrative  . None   Additional Social History:    Allergies:   Allergies  Allergen Reactions  . Codeine Nausea And Vomiting  . Morphine And Related Nausea And Vomiting    Labs:  Results for orders placed or performed during the hospital encounter of 04/22/16 (from the past 48 hour(s))  Comprehensive metabolic panel     Status: Abnormal   Collection Time: 04/22/16  9:28 PM  Result Value Ref Range   Sodium 139 135 - 145 mmol/L   Potassium 3.5 3.5 - 5.1 mmol/L   Chloride 103 101 - 111 mmol/L   CO2 29 22 - 32 mmol/L   Glucose, Bld 163 (H) 65 - 99 mg/dL   BUN 25 (H) 6 - 20 mg/dL   Creatinine, Ser 1.12 0.61 - 1.24 mg/dL    Calcium 8.7 (L) 8.9 - 10.3 mg/dL   Total Protein 6.6 6.5 - 8.1 g/dL   Albumin 3.4 (L) 3.5 - 5.0 g/dL   AST 28 15 - 41 U/L   ALT 22 17 - 63 U/L   Alkaline Phosphatase 90 38 - 126 U/L   Total Bilirubin 0.7 0.3 - 1.2 mg/dL   GFR calc non Af Amer >60 >60 mL/min   GFR calc Af Amer >60 >60 mL/min    Comment: (NOTE) The eGFR has been calculated using the CKD EPI equation. This calculation has not been validated in all clinical situations. eGFR's persistently <60 mL/min signify possible Chronic Kidney Disease.    Anion gap 7 5 - 15  Ethanol     Status: None   Collection Time: 04/22/16  9:28 PM  Result Value Ref Range   Alcohol, Ethyl (B) <5 <5 mg/dL    Comment:        LOWEST DETECTABLE LIMIT FOR SERUM ALCOHOL IS  5 mg/dL FOR MEDICAL PURPOSES ONLY   Salicylate level     Status: None   Collection Time: 04/22/16  9:28 PM  Result Value Ref Range   Salicylate Lvl <0.4 2.8 - 30.0 mg/dL  Acetaminophen level     Status: Abnormal   Collection Time: 04/22/16  9:28 PM  Result Value Ref Range   Acetaminophen (Tylenol), Serum <10 (L) 10 - 30 ug/mL    Comment:        THERAPEUTIC CONCENTRATIONS VARY SIGNIFICANTLY. A RANGE OF 10-30 ug/mL MAY BE AN EFFECTIVE CONCENTRATION FOR MANY PATIENTS. HOWEVER, SOME ARE BEST TREATED AT CONCENTRATIONS OUTSIDE THIS RANGE. ACETAMINOPHEN CONCENTRATIONS >150 ug/mL AT 4 HOURS AFTER INGESTION AND >50 ug/mL AT 12 HOURS AFTER INGESTION ARE OFTEN ASSOCIATED WITH TOXIC REACTIONS.   cbc     Status: Abnormal   Collection Time: 04/22/16  9:28 PM  Result Value Ref Range   WBC 13.0 (H) 3.8 - 10.6 K/uL   RBC 4.66 4.40 - 5.90 MIL/uL   Hemoglobin 13.1 13.0 - 18.0 g/dL   HCT 39.1 (L) 40.0 - 52.0 %   MCV 83.8 80.0 - 100.0 fL   MCH 28.2 26.0 - 34.0 pg   MCHC 33.6 32.0 - 36.0 g/dL   RDW 15.0 (H) 11.5 - 14.5 %   Platelets 152 150 - 440 K/uL  Urine Drug Screen, Qualitative     Status: None   Collection Time: 04/22/16  9:29 PM  Result Value Ref Range   Tricyclic, Ur  Screen NONE DETECTED NONE DETECTED   Amphetamines, Ur Screen NONE DETECTED NONE DETECTED   MDMA (Ecstasy)Ur Screen NONE DETECTED NONE DETECTED   Cocaine Metabolite,Ur [place] NONE DETECTED NONE DETECTED   Opiate, Ur Screen NONE DETECTED NONE DETECTED   Phencyclidine (PCP) Ur S NONE DETECTED NONE DETECTED   Cannabinoid 50 Ng, Ur [place] NONE DETECTED NONE DETECTED   Barbiturates, Ur Screen NONE DETECTED NONE DETECTED   Benzodiazepine, Ur Scrn NONE DETECTED NONE DETECTED   Methadone Scn, Ur NONE DETECTED NONE DETECTED    Comment: (NOTE) 540  Tricyclics, urine               Cutoff 1000 ng/mL 200  Amphetamines, urine             Cutoff 1000 ng/mL 300  MDMA (Ecstasy), urine           Cutoff 500 ng/mL 400  Cocaine Metabolite, urine       Cutoff 300 ng/mL 500  Opiate, urine                   Cutoff 300 ng/mL 600  Phencyclidine (PCP), urine      Cutoff 25 ng/mL 700  Cannabinoid, urine              Cutoff 50 ng/mL 800  Barbiturates, urine             Cutoff 200 ng/mL 900  Benzodiazepine, urine           Cutoff 200 ng/mL 1000 Methadone, urine                Cutoff 300 ng/mL 1100 1200 The urine drug screen provides only a preliminary, unconfirmed 1300 analytical test result and should not be used for non-medical 1400 purposes. Clinical consideration and professional judgment should 1500 be applied to any positive drug screen result due to possible 1600 interfering substances. A more specific alternate chemical method 1700 must be used in order to obtain a confirmed  analytical result.  1800 Gas chromato graphy / mass spectrometry (GC/MS) is the preferred 1900 confirmatory method.   Glucose, capillary     Status: Abnormal   Collection Time: 04/23/16  5:43 AM  Result Value Ref Range   Glucose-Capillary 108 (H) 65 - 99 mg/dL    No current facility-administered medications for this encounter.    Current Outpatient Prescriptions  Medication Sig Dispense Refill  . amLODipine (NORVASC) 5 MG tablet Take  5 mg by mouth daily.    . hydrochlorothiazide (HYDRODIURIL) 25 MG tablet Take 25 mg by mouth daily.    Marland Kitchen linagliptin (TRADJENTA) 5 MG TABS tablet Take 5 mg by mouth daily.    Marland Kitchen lisinopril (PRINIVIL,ZESTRIL) 10 MG tablet Take 10 mg by mouth daily.    . pravastatin (PRAVACHOL) 20 MG tablet Take 20 mg by mouth at bedtime.    . risperiDONE (RISPERDAL) 0.5 MG tablet Take 1 tablet by mouth 3 (three) times daily.    Marland Kitchen venlafaxine XR (EFFEXOR-XR) 75 MG 24 hr capsule Take 1 capsule by mouth daily.      Musculoskeletal: Strength & Muscle Tone: within normal limits Gait & Station: normal Patient leans: N/A  Psychiatric Specialty Exam: Physical Exam  Nursing note and vitals reviewed. Constitutional: He appears well-developed and well-nourished.  HENT:  Head: Normocephalic and atraumatic.  Eyes: Conjunctivae are normal. Pupils are equal, round, and reactive to light.  Neck: Normal range of motion.  Cardiovascular: Regular rhythm and normal heart sounds.   Respiratory: Effort normal. No respiratory distress.  GI: Soft.  Musculoskeletal: Normal range of motion.  Neurological: He is alert.  Skin: Skin is warm and dry.  Psychiatric: His speech is normal and behavior is normal. His affect is blunt. Thought content is not paranoid. He expresses impulsivity. He expresses no homicidal and no suicidal ideation. He exhibits abnormal recent memory.    Review of Systems  Constitutional: Negative.   HENT: Negative.   Eyes: Negative.   Respiratory: Negative.   Cardiovascular: Negative.   Gastrointestinal: Negative.   Musculoskeletal: Positive for myalgias.  Skin: Negative.   Neurological: Negative.   Psychiatric/Behavioral: Negative for depression, hallucinations, memory loss, substance abuse and suicidal ideas. The patient has insomnia. The patient is not nervous/anxious.     Blood pressure 135/74, pulse 98, temperature 98.6 F (37 C), temperature source Oral, resp. rate 18, height 5' 4"  (1.626 m),  weight 72.1 kg (159 lb), SpO2 100 %.Body mass index is 27.29 kg/m.  General Appearance: Casual  Eye Contact:  Minimal  Speech:  Normal Rate  Volume:  Increased  Mood:  Anxious and Irritable  Affect:  Congruent  Thought Process:  Disorganized  Orientation:  Full (Time, Place, and Person)  Thought Content:  Tangential  Suicidal Thoughts:  No  Homicidal Thoughts:  No  Memory:  Immediate;   Good Recent;   Good Remote;   Fair  Judgement:  Fair  Insight:  Shallow  Psychomotor Activity:  Normal  Concentration:  Concentration: Fair  Recall:  [employer] of Knowledge:  Fair  Language:  Fair  Akathisia:  No  Handed:  Right  AIMS (if indicated):     Assets:  Housing Resilience  ADL's:  Intact  Cognition:  WNL  Sleep:        Treatment Plan Summary: Plan 66 year old man appears to have a history of chronic mental health issues. He was just recently discharged from an inpatient unit. He presented here last night and apparently was more agitated at that point  and may have made a suicidal statement although there is no evidence that he had done anything to act on it. To my interview today he completely denies suicidal ideation. He is lucid and able to describe his life circumstances at home. He denies any homicidal or aggressive thought. Patient is requesting to be released from the emergency room. He currently does not meet commitment criteria. He will be strongly encouraged to follow-up with [facility] where he lives and to continue whatever medicine he was on when [facility] was discharged from [facility]. Case discussed with emergency room physician and TTS.  Disposition: No evidence of imminent risk to self or others at present.   Supportive therapy provided about ongoing stressors.  Alethia Berthold, MD 04/23/2016 12:10 PM

## 2016-04-23 NOTE — Discharge Instructions (Signed)
Please return immediately if condition worsens. Please contact her primary physician or the physician you were given for referral. If you have any specialist physicians involved in her treatment and plan please also contact them. Thank you for using [facility] emergency [facility]. ° °

## 2016-04-23 NOTE — ED Notes (Signed)
Pt given supper tray. Pt sitting up eating.

## 2016-04-23 NOTE — ED Notes (Signed)
Patient given belongings and medications. Walked to lobby where ride was here to take home.

## 2016-04-23 NOTE — ED Notes (Signed)
Attempted to call POA of pt to inform her of the pending discharge. [phone] left message

## 2016-04-23 NOTE — ED Notes (Signed)
Care report received, assumed care from [facility]

## 2016-04-23 NOTE — ED Notes (Signed)
Pt requested his clothes and stated he messed up with Dr. Weber Cooks and knows he is going to send him to [place]. I informed pt he would have to wait until we had orders from the Dr to know when and where. He ask about lunch and was ok with everything.

## 2016-04-24 ENCOUNTER — Encounter (HOSPITAL_COMMUNITY): Payer: Self-pay | Admitting: Emergency Medicine

## 2016-04-24 ENCOUNTER — Emergency Department (HOSPITAL_COMMUNITY): Payer: Medicare Other

## 2016-04-24 ENCOUNTER — Emergency Department (HOSPITAL_COMMUNITY)
Admission: EM | Admit: 2016-04-24 | Discharge: 2016-04-28 | Disposition: A | Discharge status: Home / Self Care | Payer: Medicare Other | Attending: Emergency Medicine | Admitting: Emergency Medicine

## 2016-04-24 DIAGNOSIS — R600 Localized edema: Secondary | ICD-10-CM | POA: Diagnosis not present

## 2016-04-24 DIAGNOSIS — I110 Hypertensive heart disease with heart failure: Secondary | ICD-10-CM | POA: Diagnosis not present

## 2016-04-24 DIAGNOSIS — R443 Hallucinations, unspecified: Secondary | ICD-10-CM | POA: Diagnosis not present

## 2016-04-24 DIAGNOSIS — I5033 Acute on chronic diastolic (congestive) heart failure: Secondary | ICD-10-CM | POA: Insufficient documentation

## 2016-04-24 DIAGNOSIS — R0602 Shortness of breath: Secondary | ICD-10-CM

## 2016-04-24 DIAGNOSIS — L03116 Cellulitis of left lower limb: Secondary | ICD-10-CM | POA: Diagnosis not present

## 2016-04-24 DIAGNOSIS — R05 Cough: Secondary | ICD-10-CM | POA: Diagnosis not present

## 2016-04-24 DIAGNOSIS — L0390 Cellulitis, unspecified: Secondary | ICD-10-CM

## 2016-04-24 DIAGNOSIS — F319 Bipolar disorder, unspecified: Secondary | ICD-10-CM | POA: Diagnosis present

## 2016-04-24 DIAGNOSIS — I10 Essential (primary) hypertension: Secondary | ICD-10-CM | POA: Diagnosis not present

## 2016-04-24 DIAGNOSIS — R918 Other nonspecific abnormal finding of lung field: Secondary | ICD-10-CM | POA: Diagnosis not present

## 2016-04-24 DIAGNOSIS — M7989 Other specified soft tissue disorders: Secondary | ICD-10-CM | POA: Diagnosis present

## 2016-04-24 DIAGNOSIS — F250 Schizoaffective disorder, bipolar type: Secondary | ICD-10-CM | POA: Diagnosis present

## 2016-04-24 LAB — COMPREHENSIVE METABOLIC PANEL
ALBUMIN: 2.8 g/dL — AB (ref 3.5–5.0)
ALT: 35 U/L (ref 17–63)
ANION GAP: 7 (ref 5–15)
AST: 31 U/L (ref 15–41)
Alkaline Phosphatase: 84 U/L (ref 38–126)
BUN: 12 mg/dL (ref 6–20)
CHLORIDE: 102 mmol/L (ref 101–111)
CO2: 25 mmol/L (ref 22–32)
Calcium: 8.5 mg/dL — ABNORMAL LOW (ref 8.9–10.3)
Creatinine, Ser: 1.07 mg/dL (ref 0.61–1.24)
GFR calc Af Amer: >60 mL/min (ref >60)
GFR calc non Af Amer: >60 mL/min (ref >60)
GLUCOSE: 182 mg/dL — AB (ref 65–99)
POTASSIUM: 3.7 mmol/L (ref 3.5–5.1)
Sodium: 134 mmol/L — ABNORMAL LOW (ref 135–145)
Total Bilirubin: 1.1 mg/dL (ref 0.3–1.2)
Total Protein: 6.2 g/dL — ABNORMAL LOW (ref 6.5–8.1)

## 2016-04-24 LAB — URINALYSIS, ROUTINE W REFLEX MICROSCOPIC
BACTERIA UA: NONE SEEN
BILIRUBIN URINE: NEGATIVE
Glucose, UA: 50 mg/dL — AB
Hgb urine dipstick: NEGATIVE
KETONES UR: NEGATIVE mg/dL
LEUKOCYTES UA: NEGATIVE
NITRITE: NEGATIVE
PH: 6 (ref 5.0–8.0)
Protein, ur: 100 mg/dL — AB
SQUAMOUS EPITHELIAL / LPF: NONE SEEN
Specific Gravity, Urine: 1.021 (ref 1.005–1.030)

## 2016-04-24 LAB — CBC
HEMATOCRIT: 39.3 % (ref 39.0–52.0)
HEMOGLOBIN: 12.8 g/dL — AB (ref 13.0–17.0)
MCH: 28.3 pg (ref 26.0–34.0)
MCHC: 32.6 g/dL (ref 30.0–36.0)
MCV: 86.8 fL (ref 78.0–100.0)
Platelets: 147 10*3/uL — ABNORMAL LOW (ref 150–400)
RBC: 4.53 MIL/uL (ref 4.22–5.81)
RDW: 14.9 % (ref 11.5–15.5)
WBC: 15.7 10*3/uL — AB (ref 4.0–10.5)

## 2016-04-24 LAB — BRAIN NATRIURETIC PEPTIDE: B NATRIURETIC PEPTIDE 5: 716.5 pg/mL — AB (ref 0.0–100.0)

## 2016-04-24 LAB — I-STAT CG4 LACTIC ACID, ED: LACTIC ACID, VENOUS: 1.65 mmol/L (ref 0.5–1.9)

## 2016-04-24 LAB — I-STAT TROPONIN, ED: TROPONIN I, POC: 0.04 ng/mL (ref 0.00–0.08)

## 2016-04-24 LAB — LIPASE, BLOOD: LIPASE: 12 U/L (ref 11–51)

## 2016-04-24 MED ORDER — FUROSEMIDE 20 MG PO TABS: 40.0000 mg | ORAL_TABLET | Freq: Every day | ORAL | Status: DC

## 2016-04-24 MED ORDER — FUROSEMIDE 10 MG/ML IJ SOLN
80.0000 mg | Freq: Once | INTRAMUSCULAR | Status: AC
  Administered 2016-04-24: 80 mg via INTRAVENOUS
  Filled 2016-04-24: qty 8

## 2016-04-24 MED ORDER — FUROSEMIDE 10 MG/ML IJ SOLN
80.0000 mg | Freq: Once | INTRAMUSCULAR | Status: DC
Start: 2016-04-24 — End: 2016-04-24

## 2016-04-24 MED ORDER — ZIPRASIDONE MESYLATE 20 MG IM SOLR
20.0000 mg | Freq: Once | INTRAMUSCULAR | Status: AC
  Administered 2016-04-24: 20 mg via INTRAMUSCULAR
  Filled 2016-04-24: qty 20

## 2016-04-24 NOTE — ED Notes (Signed)
Refused to urinate in urinal.  Ambulated to bathroom with assistance. Unable to measure output.

## 2016-04-24 NOTE — ED Notes (Signed)
In room with patient and family member.  Attempted to draw ordered blood test.  Refusing at this time. Physician made aware.

## 2016-04-24 NOTE — ED Triage Notes (Signed)
Pt here for abd pain with N/V/D and some cough x 3 days

## 2016-04-24 NOTE — ED Provider Notes (Signed)
[facility] DEPT Provider Note   CSN: [id number] Arrival date & time: 04/24/16  1443     History   Chief Complaint Chief Complaint  Patient presents with  . Abdominal Pain    HPI [name] is a 66 y.o. male.  HPI   23 her old male with history of bipolar disorder and schizophrenia, who presents with multiple complaints. Patient is accompanied by his friend who is also his power of attorney. He was admitted for 24 hours to [facility] yesterday for "a nervous breakdown." [facility]. His friend (also his POA) accompanies him today and states that she feels that he requires another inpatient psychiatric admission, but the patient is adamant that he does not. When directly asked, he says that he is not having thoughts of harming himself or anybody else, but does say that there are voices in his head telling him "to fight other people," and his friend tells me that he texted her a picture today "of a bowl full of Tylenol telling me that he was going to take all." He admits to hearing voices as well as seeing things that aren't there. States it is been going on for years. He does have an outpatient psychiatrist that he refuses to follow up with and has not taking any psychiatric meds for decades "because I don't need them."   Patient's physical complaints include swelling to the bilateral lower extremities that have been going on since November. Swelling is worse in the left lower extremity. He had a lower extremity Doppler performed at an outside hospital on 3/2 with no evidence of DVT. He denies any cardiac history and is not taking any diuretics. He also states that he's been vomiting all day today and has not kept anything down "for days." His friend is with him and tells me that this is not true, that he just ate goldfish in the waiting room without vomiting. He denies abdominal pain, chest pain, or  shortness of breath.  Past Medical History:  Diagnosis Date  . Arthritis   . Bipolar disorder ([place])   . Diabetes mellitus without complication ([place])    boderline  . Heart murmur 1995  . Hypertension   . Kidney stones     Patient Active Problem List   Diagnosis Date Noted  . Acute on chronic diastolic heart failure ([place]) 03/07/2016  . Cellulitis and abscess of foot 03/05/2016  . Bipolar 1 disorder ([place]) 03/05/2016  . DM (diabetes mellitus) ([place]) 03/05/2016  . HTN (hypertension) 03/05/2016  . Involuntary commitment 03/05/2016  . Cellulitis 03/05/2016    Past Surgical History:  Procedure Laterality Date  . APPENDECTOMY    . CHOLECYSTECTOMY N/A 10/20/2013   Procedure: LAPAROSCOPIC CHOLECYSTECTOMY;  Surgeon: [name], MD;  Location: [facility];  Service: General;  Laterality: N/A;  . HIP SURGERY    . KNEE SURGERY         Home Medications    Prior to Admission medications   Medication Sig Start Date End Date Taking? Authorizing Provider  amLODipine (NORVASC) 5 MG tablet Take 5 mg by mouth daily.    Historical Provider, MD  hydrochlorothiazide (HYDRODIURIL) 25 MG tablet Take 25 mg by mouth daily.    Historical Provider, MD  linagliptin (TRADJENTA) 5 MG TABS tablet Take 5 mg by mouth daily.    Historical Provider, MD  lisinopril (PRINIVIL,ZESTRIL) 10 MG tablet Take 10 mg by mouth daily.  08/30/13   Historical Provider, MD  pravastatin (PRAVACHOL) 20 MG tablet Take 20 mg by mouth at bedtime. 05/07/15   Historical Provider, MD  risperiDONE (RISPERDAL) 0.5 MG tablet Take 1 tablet by mouth 3 (three) times daily. 04/17/16   Historical Provider, MD  venlafaxine XR (EFFEXOR-XR) 75 MG 24 hr capsule Take 1 capsule by mouth daily. 04/17/16   Historical Provider, MD    Family History History reviewed. No pertinent family history.  Social History Social History  Substance Use Topics  . Smoking status: Never Smoker  . Smokeless tobacco: Never Used  . Alcohol use No     Allergies     Codeine and Morphine and related   Review of Systems Review of Systems  Constitutional: Negative for chills and fever.  HENT: Negative for ear pain and sore throat.   Eyes: Negative for pain and visual disturbance.  Respiratory: Negative for cough and shortness of breath.   Cardiovascular: Positive for leg swelling (bilateral LEs, worse on the left). Negative for chest pain and palpitations.  Gastrointestinal: Positive for nausea and vomiting. Negative for abdominal pain and diarrhea.  Genitourinary: Negative for dysuria and hematuria.  Musculoskeletal: Negative for arthralgias and back pain.  Skin: Negative for color change and rash.  Neurological: Negative for seizures and syncope.  Psychiatric/Behavioral: Positive for dysphoric mood, hallucinations and suicidal ideas. Negative for agitation, behavioral problems and confusion.     Physical Exam Updated Vital Signs BP (!) 113/54 (BP Location: Right Arm)   Pulse 69   Temp 98.5 F (36.9 C) (Oral)   Resp 16   SpO2 97%   Physical Exam  Constitutional: He is oriented to person, place, and time. He appears well-developed and well-nourished. No distress.  HENT:  Head: Normocephalic and atraumatic.  Right Ear: External ear normal.  Left Ear: External ear normal.  Nose: Nose normal.  Mouth/Throat: Oropharynx is clear and moist. No oropharyngeal exudate.  Eyes: Conjunctivae are normal. Pupils are equal, round, and reactive to light. Right eye exhibits no discharge. Left eye exhibits no discharge. No scleral icterus.  Neck: Normal range of motion. Neck supple.  Cardiovascular: Normal rate, regular rhythm, normal heart sounds and intact distal pulses.  Exam reveals no gallop and no friction rub.   No murmur heard. Pulmonary/Chest: Effort normal and breath sounds normal. No respiratory distress. He has no wheezes. He has no rales.  Abdominal: Soft. Bowel sounds are normal. He exhibits no distension. There is no tenderness.   Musculoskeletal: Normal range of motion. He exhibits edema (3+ pitting edema to the LLE up to the knee, 2+ pitting edema to the RLE up to the knee). He exhibits no tenderness.  Neurological: He is alert and oriented to person, place, and time. He exhibits normal muscle tone.  Skin: Skin is warm and dry. No rash noted. He is not diaphoretic.  Psychiatric:  Pt endorses command auditory hallucinations. He denies SI or HI, but his friend states that he has threatened to overdose on tylenol.      ED Treatments / Results  Labs (all labs ordered are listed, but only abnormal results are displayed) Labs Reviewed  COMPREHENSIVE METABOLIC PANEL - Abnormal; Notable for the following:       Result Value   Sodium 134 (*)    Glucose, Bld 182 (*)    Calcium 8.5 (*)    Total Protein 6.2 (*)    Albumin 2.8 (*)    All other components within normal limits  CBC - Abnormal; Notable  for the following:    WBC 15.7 (*)    Hemoglobin 12.8 (*)    Platelets 147 (*)    All other components within normal limits  URINALYSIS, ROUTINE W REFLEX MICROSCOPIC - Abnormal; Notable for the following:    Glucose, UA 50 (*)    Protein, ur 100 (*)    All other components within normal limits  LIPASE, BLOOD  BRAIN NATRIURETIC PEPTIDE  I-STAT CG4 LACTIC ACID, ED  Randolm Idol, ED    EKG  EKG Interpretation None       Radiology Dg Chest 2 View  Result Date: 04/24/2016 CLINICAL DATA:  Cough for several months EXAM: CHEST  2 VIEW COMPARISON:  09/21/2016 FINDINGS: Cardiac shadow is mildly enlarged but stable. Increasing left basilar infiltrate is seen. Mild central vascular congestion is noted without sizable effusion. The previously seen density in the gastric bubble is less well appreciated on the current exam. No bony abnormality is seen. IMPRESSION: Increasing mild vascular congestion. Increasing left basilar infiltrate. Electronically Signed   By: Inez Catalina M.D.   On: 04/24/2016 15:31   US Venous Img  Lower Unilateral Left  Result Date: 04/22/2016 CLINICAL DATA:  Left lower extremity swelling times 3-4 months. Pain. EXAM: LEFT LOWER EXTREMITY VENOUS DOPPLER ULTRASOUND TECHNIQUE: Gray-scale sonography with graded compression, as well as color Doppler and duplex ultrasound were performed to evaluate the lower extremity deep venous systems from the level of the common femoral vein and including the common femoral, femoral, profunda femoral, popliteal and calf veins including the posterior tibial, peroneal and gastrocnemius veins when visible. The superficial great saphenous vein was also interrogated. Spectral Doppler was utilized to evaluate flow at rest and with distal augmentation maneuvers in the common femoral, femoral and popliteal veins. COMPARISON:  03/05/2016 FINDINGS: Contralateral Common Femoral Vein: Respiratory phasicity is normal and symmetric with the symptomatic side. No evidence of thrombus. Normal compressibility. Common Femoral Vein: No evidence of thrombus. Normal compressibility, respiratory phasicity and response to augmentation. Saphenofemoral Junction: No evidence of thrombus. Normal compressibility and flow on color Doppler imaging. Profunda Femoral Vein: No evidence of thrombus. Normal compressibility and flow on color Doppler imaging. Femoral Vein: No evidence of thrombus. Normal compressibility, respiratory phasicity and response to augmentation. Popliteal Vein: No evidence of thrombus. Normal compressibility, respiratory phasicity and response to augmentation. Calf Veins: No evidence of thrombus. Normal compressibility and flow on color Doppler imaging. Superficial Great Saphenous Vein: No evidence of thrombus. Normal compressibility and flow on color Doppler imaging. Venous Reflux:  None. Other Findings:  None. IMPRESSION: No evidence of deep venous thrombosis. Electronically Signed   By: Ashley Royalty M.D.   On: 04/22/2016 23:59    Procedures Procedures (including critical care  time)  Medications Ordered in ED Medications - No data to display   Initial Impression / Assessment and Plan / ED Course  I have reviewed the triage vital signs and the nursing notes.  Pertinent labs & imaging results that were available during my care of the patient were reviewed by me and considered in my medical decision making (see chart for details).     Afebrile and hemodynamically stable. Patient has edema to the bilateral lower extremities. BNP elevated to 716. Patient has known mild diastolic heart failure. 70 mg of IV Lasix given, and 40 mg of by mouth Lasix ordered daily. Chest x-ray with mild vascular congestion. Patient endorses vomiting, but is tolerating oral intake and abdominal exam is benign. I have a low suspicion for an infectious  process.  As the patient endorses command hallucinations telling him to be violent towards other people, and his friend expressed concern that he has threatened to overdose on medications, he is placed under involuntary commitment, and will be evaluated by psychiatry. He is medically cleared and daily Lasix ordered.  Care of patient overseen by my attending, Dr. Laneta Simmers.  Final Clinical Impressions(s) / ED Diagnoses   Final diagnoses:  None    New Prescriptions New Prescriptions   No medications on file     Anastasija Anfinson Algernon Huxley, MD 04/25/16 0025    Leo Grosser, MD 04/25/16 [phone]

## 2016-04-24 NOTE — ED Notes (Signed)
ED Provider at bedside. 

## 2016-04-25 DIAGNOSIS — Z79899 Other long term (current) drug therapy: Secondary | ICD-10-CM | POA: Diagnosis not present

## 2016-04-25 DIAGNOSIS — L03116 Cellulitis of left lower limb: Secondary | ICD-10-CM | POA: Diagnosis not present

## 2016-04-25 DIAGNOSIS — F250 Schizoaffective disorder, bipolar type: Secondary | ICD-10-CM

## 2016-04-25 DIAGNOSIS — Z888 Allergy status to other drugs, medicaments and biological substances status: Secondary | ICD-10-CM

## 2016-04-25 HISTORY — DX: Schizoaffective disorder, bipolar type: F25.0

## 2016-04-25 MED ORDER — OLANZAPINE 5 MG PO TABS
5.0000 mg | ORAL_TABLET | Freq: Two times a day (BID) | ORAL | Status: DC
  Filled 2016-04-25: qty 1

## 2016-04-25 MED ORDER — ACETAMINOPHEN 325 MG PO TABS
650.0000 mg | ORAL_TABLET | Freq: Once | ORAL | Status: AC
  Administered 2016-04-25: 650 mg via ORAL
  Filled 2016-04-25: qty 2

## 2016-04-25 MED ORDER — OLANZAPINE 10 MG IM SOLR
5.0000 mg | Freq: Two times a day (BID) | INTRAMUSCULAR | Status: DC
  Administered 2016-04-25 – 2016-04-26 (×3): 5 mg via INTRAMUSCULAR
  Filled 2016-04-25 (×3): qty 10

## 2016-04-25 MED ORDER — RISPERIDONE 1 MG PO TABS: 1.5000 mg | ORAL_TABLET | Freq: Every day | ORAL | Status: DC

## 2016-04-25 MED ORDER — RISPERIDONE 0.5 MG PO TABS: 0.7500 mg | ORAL_TABLET | Freq: Every day | ORAL | Status: DC

## 2016-04-25 MED ORDER — AMLODIPINE BESYLATE 5 MG PO TABS
5.0000 mg | ORAL_TABLET | Freq: Every day | ORAL | Status: DC
  Administered 2016-04-26 – 2016-04-28 (×3): 5 mg via ORAL
  Filled 2016-04-25 (×4): qty 1

## 2016-04-25 MED ORDER — LINAGLIPTIN 5 MG PO TABS
5.0000 mg | ORAL_TABLET | Freq: Every day | ORAL | Status: DC
  Administered 2016-04-26 – 2016-04-28 (×3): 5 mg via ORAL
  Filled 2016-04-25 (×5): qty 1

## 2016-04-25 MED ORDER — LISINOPRIL 10 MG PO TABS
10.0000 mg | ORAL_TABLET | Freq: Every day | ORAL | Status: DC
  Administered 2016-04-27 – 2016-04-28 (×2): 10 mg via ORAL
  Filled 2016-04-25 (×4): qty 1

## 2016-04-25 MED ORDER — HYDROCHLOROTHIAZIDE 25 MG PO TABS
25.0000 mg | ORAL_TABLET | Freq: Every day | ORAL | Status: DC
  Administered 2016-04-26 – 2016-04-28 (×3): 25 mg via ORAL
  Filled 2016-04-25 (×4): qty 1

## 2016-04-25 MED ORDER — PRAVASTATIN SODIUM 40 MG PO TABS
20.0000 mg | ORAL_TABLET | Freq: Every day | ORAL | Status: DC
  Administered 2016-04-26 – 2016-04-28 (×3): 20 mg via ORAL
  Filled 2016-04-25 (×5): qty 1

## 2016-04-25 MED ORDER — OLANZAPINE 5 MG PO TABS: 5.0000 mg | ORAL_TABLET | Freq: Two times a day (BID) | ORAL | Status: DC

## 2016-04-25 MED ORDER — VENLAFAXINE HCL ER 75 MG PO CP24
75.0000 mg | ORAL_CAPSULE | Freq: Every day | ORAL | Status: DC
  Filled 2016-04-25: qty 1

## 2016-04-25 MED ORDER — CLONAZEPAM 0.5 MG PO TABS
0.5000 mg | ORAL_TABLET | Freq: Every day | ORAL | Status: DC
  Administered 2016-04-25 – 2016-04-27 (×3): 0.5 mg via ORAL
  Filled 2016-04-25 (×3): qty 1

## 2016-04-25 NOTE — ED Notes (Signed)
Pt's POA Langley Gauss [phone] - called checking on pt. Advised her waiting for telepsych and will call her once it has bee completed and decision has been made. Voiced appreciation and understanding.

## 2016-04-25 NOTE — ED Provider Notes (Addendum)
Patient is alert pocket of ambulatory denies any complaint other than mild swelling in his feet. Denies shortness of breath denies abdominal pain. On exam he appears alert pleasant cooperative no distress lungs clear auscultation abdomen nondistended nontender bilateral lower extremities with trace pedal edema.    Orlie Dakin, MD 04/25/16 [phone] Patient's home medications will be reordered including HCTZ. Lasix will be discontinued   Orlie Dakin, MD 04/25/16 [phone]

## 2016-04-25 NOTE — ED Notes (Signed)
Pharmacist to link po and IM Zyprexa.

## 2016-04-25 NOTE — ED Notes (Signed)
Regular Diet has been ordered for patient .
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
## 2016-04-25 NOTE — ED Notes (Signed)
Pt ambulating around in room talking w/sitter.

## 2016-04-25 NOTE — ED Notes (Signed)
Refusing meds. States he is aware he may not be able to be d/c'd to home if he does not take his meds. Requested for RN to advise NP, [place], "not to bother calling me back since I'm not leaving. There ain't no use".

## 2016-04-25 NOTE — ED Notes (Signed)
Regular Dinner order has been ordered.

## 2016-04-25 NOTE — ED Notes (Signed)
Pt pacing in room.

## 2016-04-25 NOTE — ED Notes (Signed)
Edwina Barth, FNP, [facility], aware pt refusing po meds. Pt advised he will take an injection. Dr Johnney Killian aware pt refusing and C Winthrow, FNP, [facility], advised Zyprexa may be given po or IM - 1:1 concentration. Order received for Zyprexa '5mg'$  po or IM.

## 2016-04-25 NOTE — ED Notes (Signed)
Telepsych being performed. 

## 2016-04-25 NOTE — ED Notes (Signed)
Pacing in room

## 2016-04-25 NOTE — ED Notes (Signed)
Patient was given a [place] Sandwich bag with graham crackers and peanut butter,w/ a cup of Ginger Ale.

## 2016-04-25 NOTE — ED Notes (Signed)
Encouraged pt to take po med Zyprexa or may be administered as injection. Pt states prefers injection.

## 2016-04-25 NOTE — ED Notes (Signed)
Pt noted to be pacing in room - remains calm, cooperative.

## 2016-04-25 NOTE — ED Notes (Signed)
Pt continuing to pace in room.

## 2016-04-25 NOTE — ED Notes (Signed)
Tolerated injection well. States "I hope I can get out of here on Monday. I don't mind staying [name] because there's nothing to do." Advised pt if may be able to do so if he will take his meds. States "I'm not going to. I'm not going to take them here or at home either."

## 2016-04-25 NOTE — ED Notes (Signed)
POA paperwork received from [facility].  Copy obtained and placed in chart.  Her number is [phone].

## 2016-04-25 NOTE — ED Notes (Signed)
TTS at bedside. 

## 2016-04-25 NOTE — ED Notes (Signed)
EKG performed by Joyice Faster, NT. Given to Dr Johnney Killian.

## 2016-04-25 NOTE — Progress Notes (Signed)
Patient has been referred to inpatient treatment at the following facilities: [facility], [facility], [facility], [place], [facility] (per [facility]), [facility].  At capacity: [facility], [facility], [place].  CSW in disposition will continue to seek placement.  Verlon Setting, [place] [facility] Disposition staff 04/25/2016 2:50 PM

## 2016-04-25 NOTE — ED Notes (Signed)
Clean scrubs and toiletries given. Washing up in the room.  States I just need a little bird bath.  Encouraged to call for assistance as needed.

## 2016-04-25 NOTE — ED Notes (Addendum)
Patient walking around room at this time.

## 2016-04-25 NOTE — ED Notes (Signed)
Patient was given a snack and drink, and a Regular diet was taken.
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
## 2016-04-25 NOTE — ED Notes (Signed)
Spoke w/pt's POA - Denise - advised her waiting for disposition and that pt refused meds. States he was refusing meds at home since he was d/c'd from [facility] x 2 days ago.

## 2016-04-26 ENCOUNTER — Emergency Department (HOSPITAL_COMMUNITY): Payer: Medicare Other

## 2016-04-26 DIAGNOSIS — R918 Other nonspecific abnormal finding of lung field: Secondary | ICD-10-CM | POA: Diagnosis not present

## 2016-04-26 DIAGNOSIS — L03116 Cellulitis of left lower limb: Secondary | ICD-10-CM | POA: Diagnosis not present

## 2016-04-26 MED ORDER — OLANZAPINE 5 MG PO TABS: 5.0000 mg | ORAL_TABLET | Freq: Two times a day (BID) | ORAL | Status: DC

## 2016-04-26 MED ORDER — OLANZAPINE 5 MG PO TABS
5.0000 mg | ORAL_TABLET | Freq: Two times a day (BID) | ORAL | Status: DC
  Administered 2016-04-26 – 2016-04-28 (×4): 5 mg via ORAL
  Filled 2016-04-26 (×4): qty 1

## 2016-04-26 NOTE — ED Notes (Signed)
Returned from x-ray via w/c w/Sitter.

## 2016-04-26 NOTE — ED Notes (Signed)
Pt eating snack.

## 2016-04-26 NOTE — ED Notes (Signed)
Pt refusing for injection to be given any place other than right hip.

## 2016-04-26 NOTE — ED Notes (Signed)
Pt has been pacing about in room - turning on and off light.

## 2016-04-26 NOTE — ED Notes (Signed)
Pt was lying on bed w/sheet and blanket covering him. Pt had his hands under covers and appeared to be placed on his genital area - movement noted w/bed linens in this area. When EMT asked pt if he was ready to go to the shower, pt pulled back covers and pulled his pants up. Pt ambulatory to shower w/EMT.

## 2016-04-26 NOTE — ED Notes (Signed)
Pt continues to pace in room. Pt ambulated to bathroom - left door open while emptying bladder. Sitter turned light on and closed the door for pt. When pt exited bathroom, pt spoke loudly to [place] about him not being happy w/her closing the door. Pt then went into his room and attempted to close the door. Sitter opened pt's door. Pt then kept opening and shutting door to room - asking "Is this where you want it?" RN spoke w/pt.  Pt apologized for his inappropriate behavior toward Sitter earlier. Pt refused dinner tray even after much encouragement - he did take the Diet Coke offered.

## 2016-04-26 NOTE — ED Notes (Signed)
IVC papers faxed to [facility]. Verified receipt. Had to be re-done d/t [employer] Officer completed sections A & D - rather than A & B.

## 2016-04-26 NOTE — ED Notes (Signed)
Pt noted to be irritable. Pt has called his sister x 2 - stating he wants to leave. Pt cursed at staff then went into his room and closed the door. RN went in and spoke w/pt - allowed him to vent feelings and encouraged for him to display appropriate behavior. Voiced understanding then stated "but I'm not going to apologize". Pt laid down on bed and closed his eyes. Lights turned off and door left 1/2-way open.

## 2016-04-26 NOTE — BH Assessment (Signed)
CSW completed reassessment. Pt denies current SI, HI and AVH. Pt states he slept well last night. He states he has trouble sleeping at home due to sleep apnea, but sleeps better in the hospital. "They know what they are doing here. I don't know what I am doing at home." He states he needs help with his feet because they might need to be amputated. Pt's thought process appeared to be circumstantial.  Wray Kearns MSW, LCSWA 04/26/2016 12:16 PM

## 2016-04-26 NOTE — ED Notes (Addendum)
Pt on phone talking w/his sister - Langley Gauss - advising her he "is not going to take his meds and I will not be able to leave the hospital if I don't take them. I don't care how long I stay here."

## 2016-04-26 NOTE — Progress Notes (Addendum)
Referral to IP treatment has been followed up at the following facilities:  [facility] - per Danae Chen, d/c tomorrow, can fax referral today for review.  [facility] stated that referral sent yesterday was not found today. Referral is refaxed today.                Per Pamala Hurry, there are 2 beds, male and male geriatric.  [facility] - per Amica, if doctor accepts pt, CSW will be contacted.  [facility] - per [place], referral has been received yesterday and not reviewed yet due high acuity on the unit.  [facility] - per Maudie Mercury, refax it.   [facility] - left voicemail.  Declined at: [facility] - per Melisa, due not meeting their IP criteria recommending long term placement.   CSW in disposition will continue to seek placement.  Verlon Setting, [place] Disposition staff 04/26/2016 10:40 AM

## 2016-04-26 NOTE — ED Notes (Signed)
New set of IVC papers served - copy faxed to [facility], copy sent to Medical Records, original placed in folder for Magistrate, and all 3 sets placed on clipboard.

## 2016-04-26 NOTE — ED Notes (Signed)
Patient asleep.

## 2016-04-26 NOTE — ED Notes (Signed)
Patient refused dinner tray.  

## 2016-04-26 NOTE — ED Notes (Signed)
Patient sleeping

## 2016-04-26 NOTE — ED Notes (Signed)
Pt noted to be continuously pacing in room. Pt removed shirt - asked pt to put it back on - states is gets stuck on door handle to room. Pt's door cracked so he may ambulate around better in room - pt put shirt back on.

## 2016-04-26 NOTE — ED Notes (Signed)
Pt lies on bed for a few minutes then paces in room - restless.

## 2016-04-26 NOTE — Progress Notes (Signed)
Patient's EKG, medication list (with meds for treating pt's BMP), and doctor's notes, have been faxed to [facility] at [place], per her request.  CSW in disposition will continue to follow up and seek placement for patient.  Verlon Setting, [place] Disposition staff 04/26/2016 3:40 PM

## 2016-04-27 DIAGNOSIS — L03116 Cellulitis of left lower limb: Secondary | ICD-10-CM | POA: Diagnosis not present

## 2016-04-27 MED ORDER — ACETAMINOPHEN 500 MG PO TABS
1000.0000 mg | ORAL_TABLET | Freq: Once | ORAL | Status: AC
  Administered 2016-04-27: 1000 mg via ORAL
  Filled 2016-04-27: qty 2

## 2016-04-27 MED ORDER — LORAZEPAM 1 MG PO TABS
1.0000 mg | ORAL_TABLET | Freq: Four times a day (QID) | ORAL | Status: DC | PRN
  Administered 2016-04-27 – 2016-04-28 (×2): 1 mg via ORAL
  Filled 2016-04-27 (×2): qty 1

## 2016-04-27 MED ORDER — LORAZEPAM 2 MG/ML IJ SOLN
2.0000 mg | Freq: Once | INTRAMUSCULAR | Status: AC
  Administered 2016-04-27: 2 mg via INTRAMUSCULAR
  Filled 2016-04-27: qty 1

## 2016-04-27 MED ORDER — CEPHALEXIN 250 MG PO CAPS
500.0000 mg | ORAL_CAPSULE | Freq: Three times a day (TID) | ORAL | Status: DC
  Administered 2016-04-27 – 2016-04-28 (×4): 500 mg via ORAL
  Filled 2016-04-27 (×4): qty 2

## 2016-04-27 NOTE — ED Triage Notes (Signed)
Pt out of bed walking in room . Pt using profanity when speaking to staff .

## 2016-04-27 NOTE — ED Notes (Signed)
Regular diet was ordered for breakfast.

## 2016-04-27 NOTE — BH Assessment (Signed)
[place] Assessment Progress Note I was about ready to have a nervous breakdown. There is mental stuff in my family history and I knew I needed help" Denies SI, has HI (not currently, but was earlier, but does not want to go to prison). "I was going to take 6 Tylenol to calm me down--it works". Pt admits to being a schizophrenic and bipolar Pt 's speech is slurred, pt denies AVH in hospital, he states that he wants to go home--denies needing help anymore. 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 He does not want to take medication. TTS continues to seek placement, possible telepsych tomorrow.

## 2016-04-27 NOTE — ED Notes (Signed)
Pt up to restroom. Staff checked on patient and reminded him to not spill water on the floor. Pt got verbally aggressive towards support staff stating "you better mind your own business." Staff member tried to redirect patient. Patient continued to say "You better quit running your mouth." At this point RN went to try and deesclate the situation. RN attempted to redirect patient. Patient continued to disrespect staff verbally and was told he needed to go back to his room. Pt listened to RN but made a hand gesture towards support staff and stated "do you know what that means? If not it means fuck you." RN told patient that we do not tolerate that language and he will respect the staff that is taking care of him. Patient went into his room and sat on his bed. Calm at present time.

## 2016-04-27 NOTE — ED Notes (Signed)
Patient was given graham crackers and a cup of Ginger Ale.

## 2016-04-27 NOTE — ED Notes (Signed)
Patient up walking on side of bed, saying he doing his exercise, pt. Unsteady on feet, encouraging pt. To sit down, on side of bed.

## 2016-04-27 NOTE — ED Notes (Signed)
Pt attempting to take all of his clothes off stating that he is "trying to get comfortable." pt instructed that he is not to be naked in his room to which he responded that he "wished he had never been born" Pt refusing to put his shirt back on at this time. Pt assisted to use urinal.

## 2016-04-27 NOTE — ED Notes (Signed)
Patient talking to TTS at this time.

## 2016-04-27 NOTE — ED Notes (Signed)
Pt pacing around room. He got up from bed and went to the bathroom after c/o an ingrown toenail that's "been bothering me for a while" Pt began to get upset when he requested another snack and was told that he had already had his for the night. Pt noted to be irritable and cursing. He continued to pace around his room and went to the bathroom again. When he came out he had soap all around his face and wet socks. Pt instructed to remove the soap from his face and change his wet socks.

## 2016-04-27 NOTE — ED Notes (Signed)
Patient was given a cup of water and Cookies.

## 2016-04-27 NOTE — ED Provider Notes (Signed)
04/27/2016 02:30 PM Asked to see the patient with complaint of left foot swelling and first toe pain. Patient has very mild erythema over the first toe and dorsal aspect of the anterior left foot. No drainage. No evidence of ingrown toenail. Very low suspicion for DVT in the calf as symptoms are isolated to the toe and forefoot. No fever/chills. Patient states that symptoms of been ongoing since November and that has been improved after a "preacher prayed over it." Review of his lab work does show a leukocytosis to 15 but no other vital sign abnormalities. Plan for Keflex for the next 7 days and reassessment. Patient may be developing a mild cellulitis in the area. No evidence of abscess.   Patient remains medically clear for psychiatry evaluation. Will need Rx for abx at discharge/transfer.   Nanda Quinton, MD Emergency Medicine   Margette Fast, MD 04/27/16 [phone]

## 2016-04-27 NOTE — ED Notes (Signed)
Pt attempting to ambulate after receiving ativan. Pt instructed that it would be more safe if he remained in the bed due to a risk of falls. Pt became verbally aggressive and  stating that he "wasn't going to be treated like an invalid" and that he "won't be eating breakfast, lunch, or dinner because you have me all fucked up." Pt instructed to use less vulgar language to which he responded "I don't give a damn about my language"

## 2016-04-27 NOTE — ED Notes (Signed)
Pt refusing 0600 vitals check at this time.

## 2016-04-27 NOTE — Progress Notes (Signed)
Geni Bers at [facility] pt is on waiting list. Also referred pt to [employer], [place], [place], [place], [place], MSW, LCSW Clinical Social Work, Disposition  04/27/2016 [phone]

## 2016-04-27 NOTE — ED Triage Notes (Signed)
Pt out of bed and Pt not responding to staff  Request to be seated.  Pt was assisted with a urinal at bed side.

## 2016-04-27 NOTE — ED Triage Notes (Signed)
TC from POA to checking on PT condition. Please call [name] If Pt is placed  # [phone] .

## 2016-04-27 NOTE — ED Notes (Signed)
Pt states that he would like something to help him sleep.

## 2016-04-27 NOTE — ED Triage Notes (Signed)
PT pacing in room . Pt repeats he did not have breakfast . Pt reoriented and Pt did not remember eating  Breakfast  Today.

## 2016-04-27 NOTE — ED Notes (Signed)
Hot packs placed on patient feet, he stated his feet was hurting, The Nurse was informed.

## 2016-04-28 DIAGNOSIS — L03116 Cellulitis of left lower limb: Secondary | ICD-10-CM | POA: Diagnosis not present

## 2016-04-28 DIAGNOSIS — Z79899 Other long term (current) drug therapy: Secondary | ICD-10-CM | POA: Diagnosis not present

## 2016-04-28 DIAGNOSIS — F250 Schizoaffective disorder, bipolar type: Secondary | ICD-10-CM | POA: Diagnosis not present

## 2016-04-28 DIAGNOSIS — Z888 Allergy status to other drugs, medicaments and biological substances status: Secondary | ICD-10-CM | POA: Diagnosis not present

## 2016-04-28 MED ORDER — CEPHALEXIN 500 MG PO CAPS: 500.0000 mg | ORAL_CAPSULE | Freq: Four times a day (QID) | ORAL | 0 refills | Status: DC

## 2016-04-28 NOTE — ED Notes (Addendum)
Jody, SW, aware pt's POA coming to ED this evening and EDP requesting SW to assist w/tx plan - cleared by [facility] - either d/c to home or possibly assisted living.

## 2016-04-28 NOTE — ED Notes (Addendum)
Pt pacing in his room and pacing in the area around the nurse's station. Pt encouraged to return to his room and lie down. Pt returned to his room and continued to pace.

## 2016-04-28 NOTE — ED Notes (Signed)
[place], SW, advised [place], [place], to come speak w/pt and POA.
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
## 2016-04-28 NOTE — ED Notes (Signed)
Snack given. Lunch order given

## 2016-04-28 NOTE — ED Notes (Signed)
Dr Kathrynn Humble spoke w/pt and POA at length re: d/c planning.

## 2016-04-28 NOTE — Discharge Instructions (Signed)
Please do not engage in activity that might be harmful to you or others. See the doctors if the medications aren't helping, there are alternative meds that might be helpful.  Take the antibiotics for your skin infection. Return to the ER if you feel worse.

## 2016-04-28 NOTE — ED Notes (Signed)
IVC paperwork rescinded by Dr Kathrynn Humble - copy faxed to [employer], copy sent to Medical Records, and original placed in folder for [employer] by Nicki Reaper, Therapist, sports.

## 2016-04-28 NOTE — ED Notes (Signed)
Pt continually pacing in room and to nurses' desk w/o difficulty.

## 2016-04-28 NOTE — ED Notes (Signed)
Patient ambulated to restroom without difficulty.  Patient back to room alternating sitting on bed and pacing around room.

## 2016-04-28 NOTE — ED Notes (Signed)
Pt took po meds w/o difficulty. Pt lying down on bed. States "I'm so sleepy. Wake me up when it's supper time. I don't want to miss it".

## 2016-04-28 NOTE — ED Notes (Signed)
C Winthrow, DNP, in w/pt.

## 2016-04-28 NOTE — ED Notes (Signed)
Pt showered

## 2016-04-28 NOTE — ED Notes (Signed)
RN and [place], SW, talking w/pt and [place], [place]. Pt continues to state he is not going to take his meds. Encouraged pt to do so and to follow up w/PCP and psych.

## 2016-04-28 NOTE — ED Notes (Signed)
Dr Kathrynn Humble aware [name], CM, spoke w/pt and POA and ordered [facility] Care Management - advised he will rescind IVC paperwork and d/c pt w/rx for Keflex for toe infection.

## 2016-04-28 NOTE — ED Notes (Signed)
Pt noted to be pacing in room - talking w/sitter intermittently.

## 2016-04-28 NOTE — ED Notes (Signed)
Snack given.

## 2016-04-28 NOTE — ED Provider Notes (Addendum)
  Physical Exam  BP 130/81 (BP Location: Right Arm)   Pulse 95   Temp 97.6 F (36.4 C) (Oral)   Resp 20   SpO2 100%   Physical Exam  ED Course  Procedures  MDM  Pt has been cleared medically and from psych perspective. Family to come later today and talk to case management and SW. Plan is for patient to go home later today. SW will help with placement.  Vitals:   04/28/16 0617 04/28/16 1233  BP: 130/89 130/81  Pulse: 96 95  Resp: 18 20  Temp: 98.6 F (37 C) 97.6 F (36.4 C)       Fatemah Pourciau, MD 04/28/16 1631    8:30 PM SW has cleared. Psych cleared. POA notified and at bedside. I updated her. IVC will be rescinded. Pt not taking his meds as they make him feel loopy. I advised him to see his doctor to see alternative meds might work better. Pt finally agreed to allow someone to check on him, as the POA doesn't live with him- and SW will arrange for the close f/u. Pt advised that if he does anything reckless, and the POA thinks he needs mental admission, or if the judge finds him legally incompetent to make rational decision - he might involuntarily be admitted, and to avoid that situation, he needs to be very rational with his decisions.    [name], MD 04/28/16 2039

## 2016-04-29 ENCOUNTER — Other Ambulatory Visit: Payer: Self-pay | Admitting: *Deleted

## 2016-04-29 NOTE — Patient Outreach (Signed)
[place] [facility]) Care Management  04/29/2016  [name] 30-Jul-1950 [id number]   RN Health Coach  Attempted screening  outreach call #1 to patient.  Patient was unavailable. No voice mail pickup. Plan: RN will call patient again within 14 days.   [place] Care Management [phone]

## 2016-05-01 ENCOUNTER — Other Ambulatory Visit: Payer: Self-pay | Admitting: *Deleted

## 2016-05-01 NOTE — Patient Outreach (Signed)
[place] [facility]) Care Management  05/01/2016  DEVLIN BRINK 10/19/1950 [id number]    RN Health Coach  Attempted #2 screening  outreach call to patient.  Patient was unavailable. No voice mail pickup. Plan: RN will call patient again within 14 days.   [place] Care Management [phone]

## 2016-05-05 ENCOUNTER — Ambulatory Visit: Payer: Self-pay | Admitting: *Deleted

## 2016-05-15 DIAGNOSIS — L600 Ingrowing nail: Secondary | ICD-10-CM | POA: Diagnosis not present

## 2016-05-15 DIAGNOSIS — M79675 Pain in left toe(s): Secondary | ICD-10-CM | POA: Diagnosis not present

## 2016-05-15 DIAGNOSIS — L601 Onycholysis: Secondary | ICD-10-CM | POA: Diagnosis not present

## 2016-05-18 ENCOUNTER — Encounter: Payer: Self-pay | Admitting: *Deleted

## 2016-05-18 ENCOUNTER — Other Ambulatory Visit: Payer: Self-pay | Admitting: *Deleted

## 2016-05-18 NOTE — Patient Outreach (Signed)
[place] [facility]) Care Management  05/18/2016  [name] 11-24-1950 [id number]  RN Health Coach  Attempted #3 screening  outreach call to patient.  Patient was unavailable. No voice mail pickup. Plan: RN will send unsuccessful outreach letter and then a closure letter if no response  within 10 business days.  [place] Care Management [phone]

## 2016-05-21 DIAGNOSIS — I493 Ventricular premature depolarization: Secondary | ICD-10-CM | POA: Diagnosis not present

## 2016-05-21 DIAGNOSIS — E1121 Type 2 diabetes mellitus with diabetic nephropathy: Secondary | ICD-10-CM | POA: Diagnosis not present

## 2016-05-21 DIAGNOSIS — I10 Essential (primary) hypertension: Secondary | ICD-10-CM | POA: Diagnosis not present

## 2016-05-21 DIAGNOSIS — N182 Chronic kidney disease, stage 2 (mild): Secondary | ICD-10-CM | POA: Diagnosis not present

## 2016-05-28 ENCOUNTER — Ambulatory Visit (HOSPITAL_COMMUNITY)
Admission: RE | Admit: 2016-05-28 | Discharge: 2016-05-28 | Disposition: A | Discharge status: Home / Self Care | Payer: Medicare Other | Source: Ambulatory Visit | Attending: Pulmonary Disease | Admitting: Pulmonary Disease

## 2016-05-28 ENCOUNTER — Other Ambulatory Visit (HOSPITAL_COMMUNITY): Payer: Self-pay | Admitting: Pulmonary Disease

## 2016-05-28 DIAGNOSIS — I878 Other specified disorders of veins: Secondary | ICD-10-CM | POA: Insufficient documentation

## 2016-05-28 DIAGNOSIS — E1121 Type 2 diabetes mellitus with diabetic nephropathy: Secondary | ICD-10-CM | POA: Diagnosis not present

## 2016-05-28 DIAGNOSIS — R918 Other nonspecific abnormal finding of lung field: Secondary | ICD-10-CM | POA: Insufficient documentation

## 2016-05-28 DIAGNOSIS — J9811 Atelectasis: Secondary | ICD-10-CM | POA: Diagnosis not present

## 2016-05-28 DIAGNOSIS — I10 Essential (primary) hypertension: Secondary | ICD-10-CM | POA: Diagnosis not present

## 2016-05-28 DIAGNOSIS — F319 Bipolar disorder, unspecified: Secondary | ICD-10-CM | POA: Diagnosis not present

## 2016-05-28 DIAGNOSIS — R0602 Shortness of breath: Secondary | ICD-10-CM

## 2016-06-01 ENCOUNTER — Encounter: Payer: Self-pay | Admitting: *Deleted

## 2016-06-01 ENCOUNTER — Other Ambulatory Visit (HOSPITAL_COMMUNITY): Payer: Self-pay | Admitting: Pulmonary Disease

## 2016-06-01 DIAGNOSIS — R0602 Shortness of breath: Secondary | ICD-10-CM

## 2016-06-01 DIAGNOSIS — F319 Bipolar disorder, unspecified: Secondary | ICD-10-CM | POA: Diagnosis not present

## 2016-06-01 DIAGNOSIS — I10 Essential (primary) hypertension: Secondary | ICD-10-CM | POA: Diagnosis not present

## 2016-06-01 DIAGNOSIS — E1121 Type 2 diabetes mellitus with diabetic nephropathy: Secondary | ICD-10-CM | POA: Diagnosis not present

## 2016-06-01 DIAGNOSIS — I129 Hypertensive chronic kidney disease with stage 1 through stage 4 chronic kidney disease, or unspecified chronic kidney disease: Secondary | ICD-10-CM | POA: Diagnosis not present

## 2016-06-02 NOTE — Progress Notes (Deleted)
Psychiatric Initial Adult Assessment   Patient Identification: [name] MRN:  [id number] Date of Evaluation:  06/02/2016 Referral Source: *** Chief Complaint:   Visit Diagnosis: No diagnosis found.  History of Present Illness:   [name] is a 66 year old male with schizophrenia, bipolar disorder per chart, hypertension, DM, CAD, COPD who is referred to establish care. Per chart review, patient was [facility], and presented to [facility] ED on 4/8 for "nervous breakdown," where he was agitated an mae suicidal statement. He was accompanied by his friend, POA the next day to ED with concern for CAH "to fight other people," and his friend received a text from him "of a bowl full of Tylenol telling me that he was going to take all." Per pychiatry consult note "[facility]) reports that the pt has been refusing all medications for psychiatry as well as for his CHF. Additionally, she reports that the pt has been awake for at least 3 days continuously and that the only way she was able to get him to come to the hospital was due to him feeling physically ill and her suggesting the ED was necessary. Pt is a client of Dr. [name] locally but pt's daughter reports some concerns about pt affording medication. Pt's daughter is also concerned that the pt reportedly called her and showed her a photo of a bowl of Tylenol, stating he would "start with 6 or 10" and finish the entire bowl to kill himself." he was started on olanzapine, clonazepam and discontinued risperidone, Effexor.  Associated Signs/Symptoms: Depression Symptoms:  {DEPRESSION SYMPTOMS:20000} (Hypo) Manic Symptoms:  {BHH MANIC SYMPTOMS:22872} Anxiety Symptoms:  {BHH ANXIETY SYMPTOMS:22873} Psychotic Symptoms:  {BHH PSYCHOTIC SYMPTOMS:22874} PTSD Symptoms: {BHH PTSD SYMPTOMS:22875}  Past Psychiatric History: ***  Previous Psychotropic Medications: {YES/NO:21197}  Substance Abuse History in the last 12  months:  {yes no:314532}  Consequences of Substance Abuse: {BHH CONSEQUENCES OF SUBSTANCE ABUSE:22880}  Past Medical History:  Past Medical History:  Diagnosis Date  . Arthritis   . Bipolar disorder ([place])   . Diabetes mellitus without complication ([place])    boderline  . Heart murmur 1995  . Hypertension   . Kidney stones     Past Surgical History:  Procedure Laterality Date  . APPENDECTOMY    . CHOLECYSTECTOMY N/A 10/20/2013   Procedure: LAPAROSCOPIC CHOLECYSTECTOMY;  Surgeon: [name], MD;  Location: [facility];  Service: General;  Laterality: N/A;  . HIP SURGERY    . KNEE SURGERY      Family Psychiatric History: ***  Family History: No family history on file.  Social History:   Social History   Social History  . Marital status: Divorced    Spouse name: N/A  . Number of children: N/A  . Years of education: N/A   Social History Main Topics  . Smoking status: Never Smoker  . Smokeless tobacco: Never Used  . Alcohol use No  . Drug use: No  . Sexual activity: Not on file   Other Topics Concern  . Not on file   Social History Narrative  . No narrative on file    Additional Social History: ***  Allergies:   Allergies  Allergen Reactions  . Codeine Nausea And Vomiting  . Morphine And Related Nausea And Vomiting    Metabolic Disorder Labs: No results found for: HGBA1C, MPG No results found for: PROLACTIN No results found for: CHOL, TRIG, HDL, CHOLHDL, VLDL, LDLCALC   Current Medications: Current Outpatient Prescriptions  Medication Sig Dispense Refill  . amLODipine (NORVASC) 5 MG tablet Take 5 mg by mouth daily.    . cephALEXin (KEFLEX) 500 MG capsule Take 1 capsule (500 mg total) by mouth 4 (four) times daily. 28 capsule 0  . hydrochlorothiazide (HYDRODIURIL) 25 MG tablet Take 25 mg by mouth daily.    Marland Kitchen linagliptin (TRADJENTA) 5 MG TABS tablet Take 5 mg by mouth daily.    Marland Kitchen lisinopril (PRINIVIL,ZESTRIL) 10 MG tablet Take 10 mg by mouth daily.    .  pravastatin (PRAVACHOL) 20 MG tablet Take 20 mg by mouth daily.     . risperiDONE (RISPERDAL) 0.5 MG tablet Take 1.5 tablets by mouth at bedtime.     Marland Kitchen venlafaxine XR (EFFEXOR-XR) 75 MG 24 hr capsule Take 75 mg by mouth daily.      No current facility-administered medications for this visit.     Neurologic: Headache: No Seizure: No Paresthesias:No  Musculoskeletal: Strength & Muscle Tone: within normal limits Gait & Station: normal Patient leans: N/A  Psychiatric Specialty Exam: ROS  There were no vitals taken for this visit.There is no height or weight on file to calculate BMI.  General Appearance: Fairly Groomed  Eye Contact:  Good  Speech:  Clear and Coherent  Volume:  Normal  Mood:  {BHH MOOD:22306}  Affect:  {Affect (PAA):22687}  Thought Process:  Coherent and Goal Directed  Orientation:  Full (Time, Place, and Person)  Thought Content:  Logical  Suicidal Thoughts:  {ST/HT (PAA):22692}  Homicidal Thoughts:  {ST/HT (PAA):22692}  Memory:  Immediate;   Good Recent;   Good Remote;   Good  Judgement:  {Judgement (PAA):22694}  Insight:  {Insight (PAA):22695}  Psychomotor Activity:  Normal  Concentration:  Concentration: Good and Attention Span: Good  Recall:  Good  Fund of Knowledge:Good  Language: Good  Akathisia:  No  Handed:  Right  AIMS (if indicated):    Assets:  Communication Skills Desire for Improvement  ADL's:  Intact  Cognition: WNL  Sleep:  ***   Assessment  Plan  The patient demonstrates the following risk factors for suicide: Chronic risk factors for suicide include: {Chronic Risk Factors for PTYYPEJ:61164353}. Acute risk factors for suicide include: {Acute Risk Factors for PNSQZYT:46219471}. Protective factors for this patient include: {Protective Factors for Suicide GXIV:12929090}. Considering these factors, the overall suicide risk at this point appears to be {Desc; low/moderate/high:110033}. Patient {ACTION; IS/IS BOB:49969249} appropriate for  outpatient follow up.   Treatment Plan Summary: {CHL AMB [facility] MD [place] JSUN:[id number]}   Norman Clay, MD 4/17/20182:07 PM

## 2016-06-03 ENCOUNTER — Ambulatory Visit (HOSPITAL_COMMUNITY): Payer: Self-pay | Admitting: Psychiatry

## 2016-06-08 ENCOUNTER — Ambulatory Visit (HOSPITAL_COMMUNITY)
Admission: RE | Admit: 2016-06-08 | Discharge: 2016-06-08 | Disposition: A | Discharge status: Home / Self Care | Payer: Medicare Other | Source: Ambulatory Visit | Attending: Pulmonary Disease | Admitting: Pulmonary Disease

## 2016-06-08 DIAGNOSIS — I340 Nonrheumatic mitral (valve) insufficiency: Secondary | ICD-10-CM | POA: Diagnosis not present

## 2016-06-08 DIAGNOSIS — E119 Type 2 diabetes mellitus without complications: Secondary | ICD-10-CM | POA: Diagnosis not present

## 2016-06-08 DIAGNOSIS — R0602 Shortness of breath: Secondary | ICD-10-CM | POA: Insufficient documentation

## 2016-06-08 DIAGNOSIS — I352 Nonrheumatic aortic (valve) stenosis with insufficiency: Secondary | ICD-10-CM | POA: Insufficient documentation

## 2016-06-08 DIAGNOSIS — I119 Hypertensive heart disease without heart failure: Secondary | ICD-10-CM | POA: Insufficient documentation

## 2016-06-08 NOTE — Progress Notes (Signed)
*  PRELIMINARY RESULTS* Echocardiogram 2D Echocardiogram has been performed.  [name] 06/08/2016, 10:17 AM

## 2016-06-09 DIAGNOSIS — Z0000 Encounter for general adult medical examination without abnormal findings: Secondary | ICD-10-CM | POA: Diagnosis not present

## 2016-06-09 DIAGNOSIS — Z1211 Encounter for screening for malignant neoplasm of colon: Secondary | ICD-10-CM | POA: Diagnosis not present

## 2016-06-11 ENCOUNTER — Encounter: Payer: Self-pay | Admitting: Cardiology

## 2016-06-11 DIAGNOSIS — H35031 Hypertensive retinopathy, right eye: Secondary | ICD-10-CM | POA: Diagnosis not present

## 2016-06-11 DIAGNOSIS — Z7984 Long term (current) use of oral hypoglycemic drugs: Secondary | ICD-10-CM | POA: Diagnosis not present

## 2016-06-11 DIAGNOSIS — H40013 Open angle with borderline findings, low risk, bilateral: Secondary | ICD-10-CM | POA: Diagnosis not present

## 2016-06-11 DIAGNOSIS — H40001 Preglaucoma, unspecified, right eye: Secondary | ICD-10-CM | POA: Diagnosis not present

## 2016-06-11 DIAGNOSIS — H40002 Preglaucoma, unspecified, left eye: Secondary | ICD-10-CM | POA: Diagnosis not present

## 2016-06-11 DIAGNOSIS — H25013 Cortical age-related cataract, bilateral: Secondary | ICD-10-CM | POA: Diagnosis not present

## 2016-06-11 DIAGNOSIS — H524 Presbyopia: Secondary | ICD-10-CM | POA: Diagnosis not present

## 2016-06-11 DIAGNOSIS — I10 Essential (primary) hypertension: Secondary | ICD-10-CM | POA: Diagnosis not present

## 2016-06-11 DIAGNOSIS — H2513 Age-related nuclear cataract, bilateral: Secondary | ICD-10-CM | POA: Diagnosis not present

## 2016-06-11 DIAGNOSIS — E119 Type 2 diabetes mellitus without complications: Secondary | ICD-10-CM | POA: Diagnosis not present

## 2016-06-11 DIAGNOSIS — H35033 Hypertensive retinopathy, bilateral: Secondary | ICD-10-CM | POA: Diagnosis not present

## 2016-06-11 DIAGNOSIS — H25813 Combined forms of age-related cataract, bilateral: Secondary | ICD-10-CM | POA: Diagnosis not present

## 2016-07-03 ENCOUNTER — Encounter: Payer: Self-pay | Admitting: Cardiovascular Disease

## 2016-07-03 ENCOUNTER — Ambulatory Visit (INDEPENDENT_AMBULATORY_CARE_PROVIDER_SITE_OTHER): Payer: Medicare Other | Admitting: Cardiovascular Disease

## 2016-07-03 VITALS — BP 134/84 | Ht 64.0 in | Wt 187.0 lb

## 2016-07-03 DIAGNOSIS — I10 Essential (primary) hypertension: Secondary | ICD-10-CM

## 2016-07-03 DIAGNOSIS — I5043 Acute on chronic combined systolic (congestive) and diastolic (congestive) heart failure: Secondary | ICD-10-CM | POA: Diagnosis not present

## 2016-07-03 DIAGNOSIS — I429 Cardiomyopathy, unspecified: Secondary | ICD-10-CM

## 2016-07-03 DIAGNOSIS — I519 Heart disease, unspecified: Secondary | ICD-10-CM | POA: Diagnosis not present

## 2016-07-03 DIAGNOSIS — G4733 Obstructive sleep apnea (adult) (pediatric): Secondary | ICD-10-CM

## 2016-07-03 MED ORDER — CARVEDILOL 3.125 MG PO TABS: 3.1250 mg | ORAL_TABLET | Freq: Two times a day (BID) | ORAL | 3 refills | Status: DC

## 2016-07-03 MED ORDER — FUROSEMIDE 40 MG PO TABS: 40.0000 mg | ORAL_TABLET | Freq: Every day | ORAL | 3 refills | Status: DC

## 2016-07-03 NOTE — Patient Instructions (Signed)
Your physician recommends that you schedule a follow-up appointment in: 2 weeks Dr Bronson Ing    STOP Amlodipine    START Lasix 40 mg daily in the morning   START Coreg 3.125 mg twice a day     Get blood work in 2 weeks (June 1 st )    Your physician has requested that you have a lexiscan myoview. For further information please visit [email or web address]. Please follow instruction sheet, as given.        Thank you for choosing [facility] !

## 2016-07-03 NOTE — Progress Notes (Signed)
CARDIOLOGY CONSULT NOTE  Patient ID: [name] MRN: [id number] DOB/AGE: 07/23/1950 66 y.o.  Admit date: (Not on file) Primary Physician: Sinda Du, MD Referring Physician: Luan Pulling  Reason for Consultation: CHF  HPI: [name] is a 66 y.o. male who is being seen today for the evaluation of CHF at the request of Sinda Du, MD.   He has a history of bipolar disease, hypertension, and diabetes. I personally reviewed all relevant PCP office documentation, labs, and studies.  He has had 2 echocardiograms this year. The first one on 03/06/16 showed normal left ventricle systolic function and regional wall motion, LVEF 55-60%, moderate LVH, and grade 1 diastolic dysfunction.  The second echocardiogram performed on 06/08/16 showed severely reduced left ventricular systolic function, LV EF 54-65%, diffuse hypokinesis, mild to moderate LVH, grade 2 diastolic dysfunction, mild mitral regurgitation and probably mild aortic stenosis with mild aortic regurgitation.  I personally reviewed the ECG performed on 04/28/16 which showed sinus rhythm with PVCs and nonspecific T-wave abnormalities.  Labs 05/21/16: Hemoglobin 15.2, sodium 139, potassium 3.2, BUN 15, creatinine 1.14, TSH 1.25, A1c 7%.  Chest x-ray 05/28/16 showed hyperinflation with increased pulmonary vascular congestion and possibly early interstitial edema and trace pleural effusions.  It appears there is a significant discrepancy on the medication list from PCPs office and the medications that the patient has brought with him. He thought he was on a diuretic but showed me his lisinopril bottle. He is currently not on any diuretics. He is on amlodipine, lisinopril, potassium, Protonix, linagliptin, and olanzapine.  He denies a history of myocardial infarction and denies exertional chest pain. He gets abdominal pain after eating which rises up to his esophagus. He does have exertional dyspnea but this has improved with  taking his medications. He said he works out with the medicine ball and was only able to do 6 before and is now able to do 41. He denies orthopnea and paroxysmal nocturnal dyspnea.  He has sleep apnea but does not tolerate CPAP.   Allergies  Allergen Reactions  . Codeine Nausea And Vomiting  . Morphine And Related Nausea And Vomiting    Current Outpatient Prescriptions  Medication Sig Dispense Refill  . amLODipine (NORVASC) 5 MG tablet Take 5 mg by mouth daily.    Marland Kitchen linagliptin (TRADJENTA) 5 MG TABS tablet Take 5 mg by mouth daily.    Marland Kitchen lisinopril (PRINIVIL,ZESTRIL) 10 MG tablet Take 10 mg by mouth daily.    Marland Kitchen OLANZapine (ZYPREXA) 5 MG tablet Take 5 mg by mouth 2 (two) times daily.    . pantoprazole (PROTONIX) 40 MG tablet Take 40 mg by mouth daily.    . potassium chloride SA (K-DUR,KLOR-CON) 20 MEQ tablet Take 20 mEq by mouth daily.     No current facility-administered medications for this visit.     Past Medical History:  Diagnosis Date  . Arthritis   . Bipolar disorder ([place])   . Diabetes mellitus without complication ([place])    boderline  . Heart murmur 1995  . Hypertension   . Kidney stones     Past Surgical History:  Procedure Laterality Date  . APPENDECTOMY    . CHOLECYSTECTOMY N/A 10/20/2013   Procedure: LAPAROSCOPIC CHOLECYSTECTOMY;  Surgeon: [name], MD;  Location: [facility];  Service: General;  Laterality: N/A;  . HIP SURGERY    . KNEE SURGERY      Social History   Social History  . Marital status: Divorced  Spouse name: N/A  . Number of children: N/A  . Years of education: N/A   Occupational History  . Not on file.   Social History Main Topics  . Smoking status: Never Smoker  . Smokeless tobacco: Never Used  . Alcohol use No  . Drug use: No  . Sexual activity: Not on file   Other Topics Concern  . Not on file   Social History Narrative  . No narrative on file     No family history of premature CAD in 1st degree relatives.  Current  Meds  Medication Sig  . amLODipine (NORVASC) 5 MG tablet Take 5 mg by mouth daily.  Marland Kitchen linagliptin (TRADJENTA) 5 MG TABS tablet Take 5 mg by mouth daily.  Marland Kitchen lisinopril (PRINIVIL,ZESTRIL) 10 MG tablet Take 10 mg by mouth daily.  Marland Kitchen OLANZapine (ZYPREXA) 5 MG tablet Take 5 mg by mouth 2 (two) times daily.  . pantoprazole (PROTONIX) 40 MG tablet Take 40 mg by mouth daily.  . potassium chloride SA (K-DUR,KLOR-CON) 20 MEQ tablet Take 20 mEq by mouth daily.      Review of systems complete and found to be negative unless listed above in HPI    Physical exam Blood pressure 134/84, height '5\' 4"'$  (1.626 m), weight 187 lb (84.8 kg). General: NAD Neck: JVP 12 cm, no thyromegaly or thyroid nodule.  Lungs: Clear to auscultation bilaterally with normal respiratory effort. CV: Nondisplaced PMI. Regular rate and rhythm, normal S1/S2, no N0/I3, 2/6 systolic murmur over RUSB and along LSB.  1+ pitting pretibial and dorsal pedal edema b/l up to knees.  No carotid bruit.    Abdomen: Soft, nontender, no distention.  Skin: Intact without lesions or rashes.  Neurologic: Alert and oriented x 3.  Psych: Normal affect. Extremities: No clubbing or cyanosis.  HEENT: Normal.   ECG: Most recent ECG reviewed.   Labs: Lab Results  Component Value Date/Time   K 3.7 04/24/2016 02:52 PM   BUN 12 04/24/2016 02:52 PM   CREATININE 1.07 04/24/2016 02:52 PM   ALT 35 04/24/2016 02:52 PM   TSH 1.396 03/05/2016 08:00 AM   HGB 12.8 (L) 04/24/2016 02:52 PM     Lipids: No results found for: LDLCALC, LDLDIRECT, CHOL, TRIG, HDL      ASSESSMENT AND PLAN:  1. 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 PCPs medication list shows Lasix but the patient denies taking any.  I will start Lasix 40 mg daily. He is already taking supplemental potassium. I will check a basic metabolic panel in one week. I will  discontinue amlodipine and start carvedilol 3.125 mg twice daily. I will obtain a Lexiscan Myoview stress test to evaluate for an ischemic etiology.  2. Hypertension: Controlled on present therapy. This will need to be monitored as I am discontinuing amlodipine and starting carvedilol.  3. OSA: Noncompliant with CPAP.  Disposition: Follow up in 2 weeks Signed: Kate Sable, M.D., F.A.C.C.  07/03/2016, 8:30 AM

## 2016-07-06 ENCOUNTER — Telehealth: Payer: Self-pay | Admitting: Cardiology

## 2016-07-06 NOTE — Telephone Encounter (Signed)
Spoke with pt's POA and explained his medication changes, reason for stress test lab work. She voiced understanding. She asks that we let her know any changes that are made in the future.

## 2016-07-06 NOTE — Telephone Encounter (Signed)
Please call patient's POA regarding patient's last office visit. Patient was confused with instructions and diagnosis. POA paper work on [facility]. / tg

## 2016-07-08 ENCOUNTER — Ambulatory Visit (HOSPITAL_BASED_OUTPATIENT_CLINIC_OR_DEPARTMENT_OTHER)
Admission: RE | Admit: 2016-07-08 | Discharge: 2016-07-08 | Disposition: A | Discharge status: Home / Self Care | Payer: Medicare Other | Source: Ambulatory Visit | Attending: Cardiovascular Disease | Admitting: Cardiovascular Disease

## 2016-07-08 ENCOUNTER — Ambulatory Visit (HOSPITAL_COMMUNITY)
Admission: RE | Admit: 2016-07-08 | Discharge: 2016-07-08 | Disposition: A | Discharge status: Home / Self Care | Payer: Medicare Other | Source: Ambulatory Visit | Attending: Cardiovascular Disease | Admitting: Cardiovascular Disease

## 2016-07-08 DIAGNOSIS — I252 Old myocardial infarction: Secondary | ICD-10-CM | POA: Insufficient documentation

## 2016-07-08 DIAGNOSIS — I429 Cardiomyopathy, unspecified: Secondary | ICD-10-CM | POA: Insufficient documentation

## 2016-07-08 LAB — NM MYOCAR MULTI W/SPECT W/WALL MOTION / EF
CHL CUP NUCLEAR SSS: 13
LVDIAVOL: 179 mL (ref 62–150)
LVSYSVOL: 119 mL
Peak HR: 88 {beats}/min
RATE: 0.52
Rest HR: 76 {beats}/min
SDS: 0
SRS: 13
TID: 1.22

## 2016-07-08 MED ORDER — REGADENOSON 0.4 MG/5ML IV SOLN
INTRAVENOUS | Status: AC
  Administered 2016-07-08: 0.4 mg via INTRAVENOUS
  Filled 2016-07-08: qty 5

## 2016-07-08 MED ORDER — TECHNETIUM TC 99M TETROFOSMIN IV KIT
30.0000 | PACK | Freq: Once | INTRAVENOUS | Status: AC | PRN
  Administered 2016-07-08: 30 via INTRAVENOUS

## 2016-07-08 MED ORDER — SODIUM CHLORIDE 0.9% FLUSH
INTRAVENOUS | Status: AC
  Administered 2016-07-08: 10 mL via INTRAVENOUS
  Filled 2016-07-08: qty 10

## 2016-07-08 MED ORDER — TECHNETIUM TC 99M TETROFOSMIN IV KIT
10.0000 | PACK | Freq: Once | INTRAVENOUS | Status: AC | PRN
  Administered 2016-07-08: 10 via INTRAVENOUS

## 2016-07-10 ENCOUNTER — Telehealth: Payer: Self-pay | Admitting: *Deleted

## 2016-07-10 ENCOUNTER — Encounter: Payer: Self-pay | Admitting: *Deleted

## 2016-07-10 NOTE — Telephone Encounter (Signed)
Called patient with test results. No answer. Unable to leave message to call back. Letter sent.

## 2016-07-10 NOTE — Telephone Encounter (Signed)
-----   Message from Herminio Commons, MD sent at 07/08/2016  3:55 PM EDT ----- Appears there has been an MI. No new blockages. Will treat medically for now.

## 2016-07-15 ENCOUNTER — Ambulatory Visit: Payer: Self-pay | Admitting: Cardiology

## 2016-07-16 ENCOUNTER — Encounter: Payer: Self-pay | Admitting: Cardiovascular Disease

## 2016-07-16 ENCOUNTER — Ambulatory Visit (INDEPENDENT_AMBULATORY_CARE_PROVIDER_SITE_OTHER): Payer: Medicare Other | Admitting: Cardiovascular Disease

## 2016-07-16 VITALS — BP 120/84 | HR 71 | Ht 64.0 in | Wt 183.0 lb

## 2016-07-16 DIAGNOSIS — I519 Heart disease, unspecified: Secondary | ICD-10-CM

## 2016-07-16 DIAGNOSIS — I25118 Atherosclerotic heart disease of native coronary artery with other forms of angina pectoris: Secondary | ICD-10-CM | POA: Diagnosis not present

## 2016-07-16 DIAGNOSIS — I5043 Acute on chronic combined systolic (congestive) and diastolic (congestive) heart failure: Secondary | ICD-10-CM

## 2016-07-16 DIAGNOSIS — I10 Essential (primary) hypertension: Secondary | ICD-10-CM

## 2016-07-16 DIAGNOSIS — I429 Cardiomyopathy, unspecified: Secondary | ICD-10-CM | POA: Diagnosis not present

## 2016-07-16 DIAGNOSIS — G4733 Obstructive sleep apnea (adult) (pediatric): Secondary | ICD-10-CM | POA: Diagnosis not present

## 2016-07-16 DIAGNOSIS — I209 Angina pectoris, unspecified: Secondary | ICD-10-CM

## 2016-07-16 MED ORDER — SACUBITRIL-VALSARTAN 24-26 MG PO TABS: 1.0000 | ORAL_TABLET | Freq: Two times a day (BID) | ORAL | 6 refills | Status: DC

## 2016-07-16 MED ORDER — ROSUVASTATIN CALCIUM 10 MG PO TABS: 10.0000 mg | ORAL_TABLET | Freq: Every day | ORAL | 3 refills | Status: DC

## 2016-07-16 NOTE — Progress Notes (Signed)
SUBJECTIVE: The patient presents for follow-up of acute on chronic systolic and diastolic heart failure with severe left ventricular systolic dysfunction. I started Lasix 40 mg daily on 07/03/16 along with carvedilol.  Weight today 183 pounds, was 187 pounds on 5/18.  Nuclear stress test 07/08/16 demonstrated a large area of myocardial scar with no evidence of ischemia, LVEF 33%.  He has had 2 echocardiograms this year. The first one on 03/06/16 showed normal left ventricle systolic function and regional wall motion, LVEF 55-60%, moderate LVH, and grade 1 diastolic dysfunction.  The second echocardiogram performed on 06/08/16 showed severely reduced left ventricular systolic function, LV EF 62-37%, diffuse hypokinesis, mild to moderate LVH, grade 2 diastolic dysfunction, mild mitral regurgitation and probably mild aortic stenosis with mild aortic regurgitation.  He is feeling well and denies chest pain, palpitations, leg edema, shortness of breath, orthopnea, and paroxysmal nocturnal dyspnea. He has had problems with GERD. He is taking his medications faithfully.    Review of Systems: As per "subjective", otherwise negative.  Allergies  Allergen Reactions  . Codeine Nausea And Vomiting  . Morphine And Related Nausea And Vomiting    Current Outpatient Prescriptions  Medication Sig Dispense Refill  . carvedilol (COREG) 3.125 MG tablet Take 1 tablet (3.125 mg total) by mouth 2 (two) times daily. 180 tablet 3  . furosemide (LASIX) 40 MG tablet Take 1 tablet (40 mg total) by mouth daily. 90 tablet 3  . linagliptin (TRADJENTA) 5 MG TABS tablet Take 5 mg by mouth daily.    Marland Kitchen lisinopril (PRINIVIL,ZESTRIL) 10 MG tablet Take 10 mg by mouth daily.    Marland Kitchen OLANZapine (ZYPREXA) 5 MG tablet Take 5 mg by mouth 2 (two) times daily.    . pantoprazole (PROTONIX) 40 MG tablet Take 40 mg by mouth daily.    . potassium chloride SA (K-DUR,KLOR-CON) 20 MEQ tablet Take 20 mEq by mouth daily.     No  current facility-administered medications for this visit.     Past Medical History:  Diagnosis Date  . Arthritis   . Bipolar disorder ([place])   . Diabetes mellitus without complication ([place])    boderline  . Heart murmur 1995  . Hypertension   . Kidney stones     Past Surgical History:  Procedure Laterality Date  . APPENDECTOMY    . CHOLECYSTECTOMY N/A 10/20/2013   Procedure: LAPAROSCOPIC CHOLECYSTECTOMY;  Surgeon: [name], MD;  Location: [facility];  Service: General;  Laterality: N/A;  . HIP SURGERY    . KNEE SURGERY      Social History   Social History  . Marital status: Divorced    Spouse name: N/A  . Number of children: N/A  . Years of education: N/A   Occupational History  . Not on file.   Social History Main Topics  . Smoking status: Never Smoker  . Smokeless tobacco: Never Used  . Alcohol use No  . Drug use: No  . Sexual activity: Not on file   Other Topics Concern  . Not on file   Social History Narrative  . No narrative on file     Vitals:   07/16/16 0927  BP: 120/84  Pulse: 71  SpO2: 97%  Weight: 183 lb (83 kg)  Height: 5\' 4"  (1.626 m)    Wt Readings from Last 3 Encounters:  07/16/16 183 lb (83 kg)  07/03/16 187 lb (84.8 kg)  04/22/16 159 lb (72.1 kg)     PHYSICAL EXAM General:  NAD HEENT: Normal. Neck: No JVD, no thyromegaly. Lungs: Clear to auscultation bilaterally with normal respiratory effort. CV: Nondisplaced PMI. Regular rate and rhythm, normal S1/S2, no S8/N4, 2/6 systolic murmur over RUSB and along LSB. No pretibial or periankle edema. Abdomen: Soft, nontender, no distention.  Neurologic: Alert and oriented.  Psych: Normal affect. Skin: Normal. Musculoskeletal: No gross deformities.    ECG: Most recent ECG reviewed.   Labs: Lab Results  Component Value Date/Time   K 3.7 04/24/2016 02:52 PM   BUN 12 04/24/2016 02:52 PM   CREATININE 1.07 04/24/2016 02:52 PM   ALT 35 04/24/2016 02:52 PM   TSH 1.396 03/05/2016 08:00  AM   HGB 12.8 (L) 04/24/2016 02:52 PM     Lipids: No results found for: LDLCALC, LDLDIRECT, CHOL, TRIG, HDL     ASSESSMENT AND PLAN: 1. 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 Continue carvedilol. I will stop lisinopril and start Entresto 24/26 mg twice daily after a washout period. Nuclear stress test showed significant myocardial scar.  2. Hypertension: Controlled. Monitor given change from lisinopril to [facility].  3. OSA: Noncompliant with CPAP.  4. CAD with prior MI: Symptomatically stable. Nuclear stress test results detailed above no evidence of ischemia. I will start aspirin 81 mg and Crestor 10 mg. Continue carvedilol and I am switching lisinopril to Entresto.  5. Cardiomyopathy: I will repeat an echocardiogram in September 2018 to assess for LVEF improvement. If it remains severely reduced, I will refer to EP for AICD.    Disposition: Follow up Sept 2018.  Kate Sable, M.D., F.A.C.C.

## 2016-07-16 NOTE — Patient Instructions (Signed)
Your physician recommends that you schedule a follow-up appointment in:  September with Dr Bronson Ing    Your physician has requested that you have an echocardiogram IN September BEFORE VISIT. Echocardiography is a painless test that uses sound waves to create images of your heart. It provides your doctor with information about the size and shape of your heart and how well your heart's chambers and valves are working. This procedure takes approximately one hour. There are no restrictions for this procedure.      STOP Lisinopril   START Entresto 24/26 mg twice a day, start in 2 days on June 3 rd for heart failure sample today lot # [id number]  Exp 12/2016   START Aspirin 81 mg daily  START Crestor 10 mg for cholesterol       Thank you for choosing [facility] !

## 2016-07-17 ENCOUNTER — Ambulatory Visit: Payer: Self-pay | Admitting: Cardiology

## 2016-07-17 ENCOUNTER — Telehealth: Payer: Self-pay

## 2016-07-17 DIAGNOSIS — I429 Cardiomyopathy, unspecified: Secondary | ICD-10-CM | POA: Diagnosis not present

## 2016-07-17 LAB — BASIC METABOLIC PANEL
BUN: 18 mg/dL (ref 7–25)
CALCIUM: 8.6 mg/dL (ref 8.6–10.3)
CO2: 29 mmol/L (ref 20–31)
Chloride: 99 mmol/L (ref 98–110)
Creat: 1.44 mg/dL — ABNORMAL HIGH (ref 0.70–1.25)
GLUCOSE: 306 mg/dL — AB (ref 65–99)
Potassium: 3.8 mmol/L (ref 3.5–5.3)
SODIUM: 137 mmol/L (ref 135–146)

## 2016-07-17 MED ORDER — FUROSEMIDE 40 MG PO TABS: ORAL_TABLET | ORAL | 3 refills | Status: AC

## 2016-07-17 NOTE — Telephone Encounter (Signed)
-----   Message from Herminio Commons, MD sent at 07/17/2016  3:49 PM EDT ----- Hold off on starting Entresto given rise in creatinine. Continue to hold lisinopril. Have him alternate Lasix 40 mg and 20 mg on alternate days. Repeat BMET in 2 weeks.

## 2016-07-17 NOTE — Telephone Encounter (Signed)
Unable to reach pt, spoke with family contact Langley Gauss.She says she will go see patient and tell him to hold Entresto and alternate lasix 40 mg and 20 mg . I will try to reach him on Monday.Langley Gauss says he has no cell phone.

## 2016-07-20 DIAGNOSIS — K219 Gastro-esophageal reflux disease without esophagitis: Secondary | ICD-10-CM | POA: Diagnosis not present

## 2016-07-20 DIAGNOSIS — I5022 Chronic systolic (congestive) heart failure: Secondary | ICD-10-CM | POA: Diagnosis not present

## 2016-07-20 DIAGNOSIS — E1121 Type 2 diabetes mellitus with diabetic nephropathy: Secondary | ICD-10-CM | POA: Diagnosis not present

## 2016-07-20 DIAGNOSIS — F319 Bipolar disorder, unspecified: Secondary | ICD-10-CM | POA: Diagnosis not present

## 2016-07-28 ENCOUNTER — Telehealth: Payer: Self-pay | Admitting: Cardiovascular Disease

## 2016-07-28 DIAGNOSIS — Z79899 Other long term (current) drug therapy: Secondary | ICD-10-CM

## 2016-07-28 DIAGNOSIS — R7989 Other specified abnormal findings of blood chemistry: Secondary | ICD-10-CM

## 2016-07-28 NOTE — Telephone Encounter (Signed)
Pt's POA states patient never stopped lisinopril, and he was taking amlodipine Dr Luan Pulling gave him.She will have him repeat bmet on 07/31/16, asked that I mail lab slip to her house

## 2016-07-28 NOTE — Telephone Encounter (Signed)
pls give pt's POA Denise a call concerning his BP medication and lasix, she has a few questions

## 2016-08-06 DIAGNOSIS — K219 Gastro-esophageal reflux disease without esophagitis: Secondary | ICD-10-CM | POA: Diagnosis not present

## 2016-08-06 DIAGNOSIS — N189 Chronic kidney disease, unspecified: Secondary | ICD-10-CM | POA: Diagnosis not present

## 2016-08-06 DIAGNOSIS — E1121 Type 2 diabetes mellitus with diabetic nephropathy: Secondary | ICD-10-CM | POA: Diagnosis not present

## 2016-08-06 DIAGNOSIS — I25700 Atherosclerosis of coronary artery bypass graft(s), unspecified, with unstable angina pectoris: Secondary | ICD-10-CM | POA: Diagnosis not present

## 2016-08-12 DIAGNOSIS — H25813 Combined forms of age-related cataract, bilateral: Secondary | ICD-10-CM | POA: Diagnosis not present

## 2016-08-12 DIAGNOSIS — H25013 Cortical age-related cataract, bilateral: Secondary | ICD-10-CM | POA: Diagnosis not present

## 2016-08-12 DIAGNOSIS — H40013 Open angle with borderline findings, low risk, bilateral: Secondary | ICD-10-CM | POA: Diagnosis not present

## 2016-08-12 DIAGNOSIS — H2513 Age-related nuclear cataract, bilateral: Secondary | ICD-10-CM | POA: Diagnosis not present

## 2016-08-16 ENCOUNTER — Emergency Department (HOSPITAL_COMMUNITY): Payer: Medicare Other

## 2016-08-16 ENCOUNTER — Inpatient Hospital Stay (HOSPITAL_COMMUNITY)
Admission: EM | Admit: 2016-08-16 | Discharge: 2016-08-18 | DRG: 291 | Disposition: A | Discharge status: Home / Self Care | Payer: Medicare Other | Attending: Pulmonary Disease | Admitting: Pulmonary Disease

## 2016-08-16 ENCOUNTER — Other Ambulatory Visit: Payer: Self-pay

## 2016-08-16 ENCOUNTER — Encounter (HOSPITAL_COMMUNITY): Payer: Self-pay | Admitting: Emergency Medicine

## 2016-08-16 DIAGNOSIS — I252 Old myocardial infarction: Secondary | ICD-10-CM | POA: Diagnosis not present

## 2016-08-16 DIAGNOSIS — Z7982 Long term (current) use of aspirin: Secondary | ICD-10-CM

## 2016-08-16 DIAGNOSIS — I10 Essential (primary) hypertension: Secondary | ICD-10-CM | POA: Diagnosis present

## 2016-08-16 DIAGNOSIS — E7800 Pure hypercholesterolemia, unspecified: Secondary | ICD-10-CM | POA: Diagnosis not present

## 2016-08-16 DIAGNOSIS — N179 Acute kidney failure, unspecified: Secondary | ICD-10-CM | POA: Diagnosis present

## 2016-08-16 DIAGNOSIS — I509 Heart failure, unspecified: Secondary | ICD-10-CM | POA: Insufficient documentation

## 2016-08-16 DIAGNOSIS — Z9049 Acquired absence of other specified parts of digestive tract: Secondary | ICD-10-CM | POA: Diagnosis not present

## 2016-08-16 DIAGNOSIS — I519 Heart disease, unspecified: Secondary | ICD-10-CM | POA: Diagnosis not present

## 2016-08-16 DIAGNOSIS — I2510 Atherosclerotic heart disease of native coronary artery without angina pectoris: Secondary | ICD-10-CM | POA: Diagnosis present

## 2016-08-16 DIAGNOSIS — I25118 Atherosclerotic heart disease of native coronary artery with other forms of angina pectoris: Secondary | ICD-10-CM | POA: Diagnosis not present

## 2016-08-16 DIAGNOSIS — E1122 Type 2 diabetes mellitus with diabetic chronic kidney disease: Secondary | ICD-10-CM | POA: Diagnosis present

## 2016-08-16 DIAGNOSIS — Z9114 Patient's other noncompliance with medication regimen: Secondary | ICD-10-CM | POA: Diagnosis not present

## 2016-08-16 DIAGNOSIS — E118 Type 2 diabetes mellitus with unspecified complications: Secondary | ICD-10-CM | POA: Diagnosis not present

## 2016-08-16 DIAGNOSIS — F250 Schizoaffective disorder, bipolar type: Secondary | ICD-10-CM | POA: Diagnosis present

## 2016-08-16 DIAGNOSIS — Z9119 Patient's noncompliance with other medical treatment and regimen: Secondary | ICD-10-CM | POA: Diagnosis not present

## 2016-08-16 DIAGNOSIS — N183 Chronic kidney disease, stage 3 unspecified: Secondary | ICD-10-CM | POA: Diagnosis present

## 2016-08-16 DIAGNOSIS — E1159 Type 2 diabetes mellitus with other circulatory complications: Secondary | ICD-10-CM | POA: Diagnosis present

## 2016-08-16 DIAGNOSIS — G4733 Obstructive sleep apnea (adult) (pediatric): Secondary | ICD-10-CM | POA: Diagnosis not present

## 2016-08-16 DIAGNOSIS — I5020 Unspecified systolic (congestive) heart failure: Secondary | ICD-10-CM

## 2016-08-16 DIAGNOSIS — I429 Cardiomyopathy, unspecified: Secondary | ICD-10-CM | POA: Diagnosis present

## 2016-08-16 DIAGNOSIS — R0602 Shortness of breath: Secondary | ICD-10-CM | POA: Diagnosis not present

## 2016-08-16 DIAGNOSIS — I130 Hypertensive heart and chronic kidney disease with heart failure and stage 1 through stage 4 chronic kidney disease, or unspecified chronic kidney disease: Secondary | ICD-10-CM | POA: Diagnosis not present

## 2016-08-16 DIAGNOSIS — R062 Wheezing: Secondary | ICD-10-CM | POA: Diagnosis not present

## 2016-08-16 DIAGNOSIS — I5043 Acute on chronic combined systolic (congestive) and diastolic (congestive) heart failure: Secondary | ICD-10-CM | POA: Diagnosis present

## 2016-08-16 DIAGNOSIS — N289 Disorder of kidney and ureter, unspecified: Secondary | ICD-10-CM | POA: Diagnosis not present

## 2016-08-16 DIAGNOSIS — I25708 Atherosclerosis of coronary artery bypass graft(s), unspecified, with other forms of angina pectoris: Secondary | ICD-10-CM | POA: Diagnosis not present

## 2016-08-16 DIAGNOSIS — E785 Hyperlipidemia, unspecified: Secondary | ICD-10-CM | POA: Diagnosis present

## 2016-08-16 DIAGNOSIS — Z885 Allergy status to narcotic agent status: Secondary | ICD-10-CM

## 2016-08-16 DIAGNOSIS — Z8249 Family history of ischemic heart disease and other diseases of the circulatory system: Secondary | ICD-10-CM | POA: Diagnosis not present

## 2016-08-16 DIAGNOSIS — Z87442 Personal history of urinary calculi: Secondary | ICD-10-CM

## 2016-08-16 DIAGNOSIS — E782 Mixed hyperlipidemia: Secondary | ICD-10-CM | POA: Diagnosis not present

## 2016-08-16 DIAGNOSIS — I110 Hypertensive heart disease with heart failure: Secondary | ICD-10-CM | POA: Diagnosis not present

## 2016-08-16 HISTORY — DX: Obstructive sleep apnea (adult) (pediatric): G47.33

## 2016-08-16 HISTORY — DX: Atherosclerotic heart disease of native coronary artery without angina pectoris: I25.10

## 2016-08-16 HISTORY — DX: Cutaneous abscess of unspecified foot: L02.619

## 2016-08-16 HISTORY — DX: Chronic combined systolic (congestive) and diastolic (congestive) heart failure: I50.42

## 2016-08-16 HISTORY — DX: Acute myocardial infarction, unspecified: I21.9

## 2016-08-16 HISTORY — DX: Schizoaffective disorder, bipolar type: F25.0

## 2016-08-16 HISTORY — DX: Cellulitis of unspecified part of limb: L03.119

## 2016-08-16 LAB — BASIC METABOLIC PANEL
Anion gap: 7 (ref 5–15)
BUN: 16 mg/dL (ref 6–20)
CHLORIDE: 105 mmol/L (ref 101–111)
CO2: 30 mmol/L (ref 22–32)
CREATININE: 1.29 mg/dL — AB (ref 0.61–1.24)
Calcium: 9.2 mg/dL (ref 8.9–10.3)
GFR calc Af Amer: >60 mL/min (ref >60)
GFR calc non Af Amer: 56 mL/min — ABNORMAL LOW (ref >60)
GLUCOSE: 176 mg/dL — AB (ref 65–99)
POTASSIUM: 3.9 mmol/L (ref 3.5–5.1)
SODIUM: 142 mmol/L (ref 135–145)

## 2016-08-16 LAB — URINALYSIS, ROUTINE W REFLEX MICROSCOPIC
Bacteria, UA: NONE SEEN
Bilirubin Urine: NEGATIVE
GLUCOSE, UA: NEGATIVE mg/dL
HGB URINE DIPSTICK: NEGATIVE
KETONES UR: NEGATIVE mg/dL
Leukocytes, UA: NEGATIVE
NITRITE: NEGATIVE
Specific Gravity, Urine: 1.013 (ref 1.005–1.030)
pH: 5 (ref 5.0–8.0)

## 2016-08-16 LAB — GLUCOSE, CAPILLARY: Glucose-Capillary: 152 mg/dL — ABNORMAL HIGH (ref 65–99)

## 2016-08-16 LAB — TROPONIN I
Troponin I: 0.03 ng/mL (ref <0.03)
Troponin I: 0.03 ng/mL (ref <0.03)

## 2016-08-16 LAB — CBC
HEMATOCRIT: 45.6 % (ref 39.0–52.0)
Hemoglobin: 15.3 g/dL (ref 13.0–17.0)
MCH: 27.1 pg (ref 26.0–34.0)
MCHC: 33.6 g/dL (ref 30.0–36.0)
MCV: 80.9 fL (ref 78.0–100.0)
PLATELETS: 152 10*3/uL (ref 150–400)
RBC: 5.64 MIL/uL (ref 4.22–5.81)
RDW: 15.9 % — AB (ref 11.5–15.5)
WBC: 9.9 10*3/uL (ref 4.0–10.5)

## 2016-08-16 LAB — BRAIN NATRIURETIC PEPTIDE: B Natriuretic Peptide: 1303 pg/mL — ABNORMAL HIGH (ref 0.0–100.0)

## 2016-08-16 LAB — TSH: TSH: 1.045 u[IU]/mL (ref 0.350–4.500)

## 2016-08-16 MED ORDER — LUBIPROSTONE 24 MCG PO CAPS
24.0000 ug | ORAL_CAPSULE | Freq: Two times a day (BID) | ORAL | Status: DC
  Administered 2016-08-16 – 2016-08-18 (×4): 24 ug via ORAL
  Filled 2016-08-16 (×4): qty 1

## 2016-08-16 MED ORDER — POTASSIUM CHLORIDE CRYS ER 20 MEQ PO TBCR: 20.0000 meq | EXTENDED_RELEASE_TABLET | Freq: Every day | ORAL | Status: DC

## 2016-08-16 MED ORDER — OLANZAPINE 5 MG PO TABS
5.0000 mg | ORAL_TABLET | Freq: Two times a day (BID) | ORAL | Status: DC
  Administered 2016-08-16 – 2016-08-18 (×4): 5 mg via ORAL
  Filled 2016-08-16 (×4): qty 1

## 2016-08-16 MED ORDER — SACUBITRIL-VALSARTAN 24-26 MG PO TABS
1.0000 | ORAL_TABLET | Freq: Two times a day (BID) | ORAL | Status: DC
  Administered 2016-08-17 – 2016-08-18 (×3): 1 via ORAL
  Filled 2016-08-16 (×6): qty 1

## 2016-08-16 MED ORDER — ACETAMINOPHEN 325 MG PO TABS: 650.0000 mg | ORAL_TABLET | Freq: Four times a day (QID) | ORAL | Status: DC | PRN

## 2016-08-16 MED ORDER — ONDANSETRON HCL 4 MG/2ML IJ SOLN: 4.0000 mg | Freq: Four times a day (QID) | INTRAMUSCULAR | Status: DC | PRN

## 2016-08-16 MED ORDER — ONDANSETRON HCL 4 MG PO TABS: 4.0000 mg | ORAL_TABLET | Freq: Four times a day (QID) | ORAL | Status: DC | PRN

## 2016-08-16 MED ORDER — ASPIRIN EC 81 MG PO TBEC
81.0000 mg | DELAYED_RELEASE_TABLET | Freq: Every day | ORAL | Status: DC
  Administered 2016-08-17 – 2016-08-18 (×2): 81 mg via ORAL
  Filled 2016-08-16 (×2): qty 1

## 2016-08-16 MED ORDER — DILTIAZEM HCL 100 MG IV SOLR: 10.0000 mg/h | Freq: Once | INTRAVENOUS | Status: DC

## 2016-08-16 MED ORDER — DOCUSATE SODIUM 100 MG PO CAPS
100.0000 mg | ORAL_CAPSULE | Freq: Two times a day (BID) | ORAL | Status: DC
  Administered 2016-08-16 – 2016-08-18 (×4): 100 mg via ORAL
  Filled 2016-08-16 (×4): qty 1

## 2016-08-16 MED ORDER — SODIUM CHLORIDE 0.9% FLUSH
3.0000 mL | Freq: Two times a day (BID) | INTRAVENOUS | Status: DC
  Administered 2016-08-16 – 2016-08-18 (×4): 3 mL via INTRAVENOUS

## 2016-08-16 MED ORDER — HYDRALAZINE HCL 20 MG/ML IJ SOLN: 5.0000 mg | INTRAMUSCULAR | Status: DC | PRN

## 2016-08-16 MED ORDER — PANTOPRAZOLE SODIUM 40 MG PO TBEC
40.0000 mg | DELAYED_RELEASE_TABLET | Freq: Every day | ORAL | Status: DC
  Administered 2016-08-16 – 2016-08-18 (×3): 40 mg via ORAL
  Filled 2016-08-16 (×3): qty 1

## 2016-08-16 MED ORDER — POTASSIUM CHLORIDE CRYS ER 20 MEQ PO TBCR
20.0000 meq | EXTENDED_RELEASE_TABLET | Freq: Every day | ORAL | Status: DC
  Administered 2016-08-16 – 2016-08-18 (×3): 20 meq via ORAL
  Filled 2016-08-16 (×3): qty 1

## 2016-08-16 MED ORDER — FUROSEMIDE 10 MG/ML IJ SOLN
40.0000 mg | Freq: Every day | INTRAMUSCULAR | Status: DC
  Administered 2016-08-17 – 2016-08-18 (×2): 40 mg via INTRAVENOUS
  Filled 2016-08-16 (×2): qty 4

## 2016-08-16 MED ORDER — ACETAMINOPHEN 650 MG RE SUPP: 650.0000 mg | Freq: Four times a day (QID) | RECTAL | Status: DC | PRN

## 2016-08-16 MED ORDER — INSULIN ASPART 100 UNIT/ML ~~LOC~~ SOLN
0.0000 [IU] | Freq: Three times a day (TID) | SUBCUTANEOUS | Status: DC
  Administered 2016-08-17 – 2016-08-18 (×5): 3 [IU] via SUBCUTANEOUS

## 2016-08-16 MED ORDER — DIPHENHYDRAMINE HCL 25 MG PO CAPS: 25.0000 mg | ORAL_CAPSULE | Freq: Four times a day (QID) | ORAL | Status: DC | PRN

## 2016-08-16 MED ORDER — INSULIN ASPART 100 UNIT/ML ~~LOC~~ SOLN: 0.0000 [IU] | Freq: Every day | SUBCUTANEOUS | Status: DC

## 2016-08-16 MED ORDER — ROSUVASTATIN CALCIUM 10 MG PO TABS
10.0000 mg | ORAL_TABLET | Freq: Every day | ORAL | Status: DC
  Administered 2016-08-16 – 2016-08-17 (×2): 10 mg via ORAL
  Filled 2016-08-16 (×2): qty 1

## 2016-08-16 MED ORDER — CARVEDILOL 3.125 MG PO TABS
3.1250 mg | ORAL_TABLET | Freq: Two times a day (BID) | ORAL | Status: DC
  Administered 2016-08-16: 3.125 mg via ORAL
  Filled 2016-08-16: qty 1

## 2016-08-16 MED ORDER — ALBUTEROL SULFATE (2.5 MG/3ML) 0.083% IN NEBU: 2.5000 mg | INHALATION_SOLUTION | RESPIRATORY_TRACT | Status: DC | PRN

## 2016-08-16 MED ORDER — ENOXAPARIN SODIUM 40 MG/0.4ML ~~LOC~~ SOLN
40.0000 mg | SUBCUTANEOUS | Status: DC
  Administered 2016-08-16 – 2016-08-17 (×2): 40 mg via SUBCUTANEOUS
  Filled 2016-08-16 (×2): qty 0.4

## 2016-08-16 MED ORDER — FUROSEMIDE 10 MG/ML IJ SOLN
40.0000 mg | Freq: Once | INTRAMUSCULAR | Status: AC
  Administered 2016-08-16: 40 mg via INTRAVENOUS
  Filled 2016-08-16: qty 4

## 2016-08-16 NOTE — ED Notes (Signed)
MD at bedside. 

## 2016-08-16 NOTE — ED Provider Notes (Signed)
[facility] DEPT Provider Note   CSN: [id number] Arrival date & time: 08/16/16  1141     History   Chief Complaint Chief Complaint  Patient presents with  . Shortness of Breath    HPI [name] is a 66 y.o. male.  Patient complains of shortness of breath for couple days now. It is worse while lying down   The history is provided by the patient.  Shortness of Breath  This is a new problem. The problem occurs intermittently.The current episode started 2 days ago. The problem has not changed since onset.Pertinent negatives include no fever, no headaches, no cough, no chest pain, no abdominal pain and no rash. It is unknown what precipitated the problem. Risk factors: History of MI and congestive heart failure.    Past Medical History:  Diagnosis Date  . Arthritis   . Bipolar disorder ([place])   . Diabetes mellitus without complication ([place])    boderline  . Heart murmur 1995  . Hypertension   . Kidney stones     Patient Active Problem List   Diagnosis Date Noted  . CHF (congestive heart failure) ([place]) 08/16/2016  . Schizoaffective disorder, bipolar type ([place]) 04/25/2016  . Acute on chronic diastolic heart failure ([place]) 03/07/2016  . Cellulitis and abscess of foot 03/05/2016  . DM (diabetes mellitus) ([place]) 03/05/2016  . HTN (hypertension) 03/05/2016  . Involuntary commitment 03/05/2016  . Cellulitis 03/05/2016    Past Surgical History:  Procedure Laterality Date  . APPENDECTOMY    . CHOLECYSTECTOMY N/A 10/20/2013   Procedure: LAPAROSCOPIC CHOLECYSTECTOMY;  Surgeon: [name], MD;  Location: [facility];  Service: General;  Laterality: N/A;  . HIP SURGERY    . KNEE SURGERY         Home Medications    Prior to Admission medications   Medication Sig Start Date End Date Taking? Authorizing Provider  AMITIZA 24 MCG capsule Take 1 capsule by mouth 2 (two) times daily. 08/06/16  Yes [provider]  aspirin EC 81 MG tablet Take 81 mg by mouth daily.    Yes [provider]  carvedilol (COREG) 3.125 MG tablet Take 1 tablet (3.125 mg total) by mouth 2 (two) times daily. 07/03/16 10/01/16 Yes Herminio Commons, MD  furosemide (LASIX) 40 MG tablet Alternate 40 mg one day and 20 mg the next 07/17/16  Yes Herminio Commons, MD  OLANZapine (ZYPREXA) 5 MG tablet Take 5 mg by mouth 2 (two) times daily.   Yes [provider]  pantoprazole (PROTONIX) 40 MG tablet Take 40 mg by mouth daily.   Yes [provider]  potassium chloride SA (K-DUR,KLOR-CON) 20 MEQ tablet Take 20 mEq by mouth daily.   Yes [provider]  linagliptin (TRADJENTA) 5 MG TABS tablet Take 5 mg by mouth daily.    [provider]  rosuvastatin (CRESTOR) 10 MG tablet Take 1 tablet (10 mg total) by mouth daily. 07/16/16 10/14/16  Herminio Commons, MD  sacubitril-valsartan (ENTRESTO) 24-26 MG Take 1 tablet by mouth 2 (two) times daily. 07/16/16   Herminio Commons, MD    Family History Family History  Problem Relation Age of Onset  . Hypertension Mother   . Dementia Mother     Social History Social History  Substance Use Topics  . Smoking status: Never Smoker  . Smokeless tobacco: Never Used  . Alcohol use No     Allergies   Codeine and Morphine and related   Review of Systems Review  of Systems  Constitutional: Negative for appetite change, fatigue and fever.  HENT: Negative for congestion, ear discharge and sinus pressure.   Eyes: Negative for discharge.  Respiratory: Positive for shortness of breath. Negative for cough.   Cardiovascular: Negative for chest pain.  Gastrointestinal: Negative for abdominal pain and diarrhea.  Genitourinary: Negative for frequency and hematuria.  Musculoskeletal: Negative for back pain.  Skin: Negative for rash.  Neurological: Negative for seizures and headaches.  Psychiatric/Behavioral: Negative for hallucinations.     Physical Exam Updated Vital Signs BP (!) 151/121 (BP  Location: Left Arm)   Pulse 84   Temp 98 F (36.7 C) (Oral)   Resp 19   Ht 5\' 4"  (1.626 m)   Wt 83.9 kg (185 lb)   SpO2 96%   BMI 31.76 kg/m   Physical Exam  Constitutional: He is oriented to person, place, and time. He appears well-developed.  HENT:  Head: Normocephalic.  Eyes: Conjunctivae and EOM are normal. No scleral icterus.  Neck: Neck supple. No thyromegaly present.  Cardiovascular: Normal rate and regular rhythm.  Exam reveals no gallop and no friction rub.   No murmur heard. Pulmonary/Chest: No stridor. He has no wheezes. He has no rales. He exhibits no tenderness.  Abdominal: He exhibits no distension. There is no tenderness. There is no rebound.  Musculoskeletal: Normal range of motion. He exhibits no edema.  Lymphadenopathy:    He has no cervical adenopathy.  Neurological: He is oriented to person, place, and time. He exhibits normal muscle tone. Coordination normal.  Skin: No rash noted. No erythema.  Psychiatric: He has a normal mood and affect. His behavior is normal.     ED Treatments / Results  Labs (all labs ordered are listed, but only abnormal results are displayed) Labs Reviewed  BASIC METABOLIC PANEL - Abnormal; Notable for the following:       Result Value   Glucose, Bld 176 (*)    Creatinine, Ser 1.29 (*)    GFR calc non Af Amer 56 (*)    All other components within normal limits  CBC - Abnormal; Notable for the following:    RDW 15.9 (*)    All other components within normal limits  TROPONIN I - Abnormal; Notable for the following:    Troponin I 0.03 (*)    All other components within normal limits  BRAIN NATRIURETIC PEPTIDE - Abnormal; Notable for the following:    B Natriuretic Peptide 1,303.0 (*)    All other components within normal limits    EKG  EKG Interpretation  Date/Time:  Sunday August 16 2016 11:46:41 EDT Ventricular Rate:  94 PR Interval:  198 QRS Duration: 80 QT Interval:  364 QTC Calculation: 455 R Axis:   -44 Text  Interpretation:  Sinus rhythm with occasional Premature ventricular complexes Left axis deviation Minimal voltage criteria for LVH, may be normal variant Septal infarct , age undetermined Abnormal ECG Confirmed by Milton Ferguson [phone]) on 08/16/2016 1:15:48 PM       Radiology Dg Chest 2 View  Result Date: 08/16/2016 CLINICAL DATA:  Worsening shortness of breath last night, history hypertension, diabetes mellitus EXAM: CHEST  2 VIEW COMPARISON:  05/28/2016 FINDINGS: Enlargement of cardiac silhouette with pulmonary vascular congestion. Mediastinal contour stable. Diffuse interstitial infiltrates likely pulmonary edema. No segmental consolidation, pleural effusion, or pneumothorax. Bones demineralized. IMPRESSION: Enlargement of cardiac silhouette with pulmonary vascular congestion and probable pulmonary edema. Electronically Signed   By: Lavonia Dana M.D.   On: 08/16/2016 12:23  Procedures Procedures (including critical care time)  Medications Ordered in ED Medications  furosemide (LASIX) injection 40 mg (40 mg Intravenous Given 08/16/16 1348)     Initial Impression / Assessment and Plan / ED Course  I have reviewed the triage vital signs and the nursing notes.  Pertinent labs & imaging results that were available during my care of the patient were reviewed by me and considered in my medical decision making (see chart for details).     Patient with mild congestive heart failure and poorly controlled blood pressure. He will be admitted to medicine for diuresis and possibly cardiology consult  Final Clinical Impressions(s) / ED Diagnoses   Final diagnoses:  Systolic congestive heart failure, unspecified HF chronicity ([place])    New Prescriptions New Prescriptions   No medications on file     Milton Ferguson, MD 08/16/16 1441

## 2016-08-16 NOTE — ED Notes (Signed)
Date and time results received: 08/16/16 1350   Test: Troponin  Critical Value: 0.03  Name of Provider Notified: Dr. Roderic Palau  Orders Received? Or Actions Taken?: No new orders given.

## 2016-08-16 NOTE — ED Triage Notes (Signed)
Pt reports shortness of breath since yesterday with intermittent chest pain.  Feels like not enough air going in.  Intermittent chest pain x 2 days.

## 2016-08-16 NOTE — ED Notes (Signed)
[facility] [phone] requests to be called with any updates.

## 2016-08-16 NOTE — H&P (Addendum)
History and Physical    [name]:[id number] DOB: [birth year] DOA: 08/16/2016  PCP: Sinda Du, MD  Patient coming from: Home  I have personally briefly reviewed patient's old medical records in [place]  Chief Complaint: Shortness of breath  HPI: [name] is a 66 y.o. male with medical history significant for chronic combined systolic and diastolic heart failure with an EF of 20-25% and grade 2 diastolic dysfunction per echo 06/08/16, Type 2 diabetes mellitus, OSA-noncompliant with CPAP, hypertension, and schizoaffective disorder-bipolar type. He presents with a 2 to 3-day history of progressive shortness of breath. He endorses orthopnea and PND. He denies a significant increase in the swelling of his legs. He has had occasional left-sided chest pain that comes and goes, but not associated with diaphoresis or radiation of the pain. He has had a couple episodes of nausea and vomiting, but he does not associate his GI symptoms with chest pain. He denies abdominal pain, diarrhea, bloody stools, black tarry stools, and hematemesis, but has had some pain with urination. He denies cough, pleurisy, fever, or chills. He denies missing any of his heart medications, but his POA, Ms. Toms, reports that his cardiologist decreased the dose of Lasix from 40 mg daily to 40 mg alternating with 20 mg due to an increase in his creatinine.  ED Course: In the ED, he was afebrile and and hypertensive initially with a blood pressure 189/107. His lab data are significant for BNP of 1303, troponin I of 0.03, glucose 176, creatinine 1.29. His chest x-ray revealed enlargement of the cardiac silhouette, pulmonary vascular congestion, and possible pulmonary edema. His EKG reveals HR of 94, PVCs, and left axis deviation. He is being admitted for further evaluation and management.  Review of Systems: As per HPI otherwise 10 point review of systems negative.    Past Medical History:  Diagnosis Date    . Arthritis   . Bipolar disorder ([place])   . CAD (coronary artery disease) 08/16/2016  . Cellulitis and abscess of foot 03/05/2016  . Chronic combined systolic and diastolic heart failure ([place])   . Diabetes mellitus without complication ([place])    boderline  . Heart murmur 1995  . Hypertension   . Kidney stones   . Myocardial infarction ([place])   . OSA (obstructive sleep apnea)   . Schizoaffective disorder, bipolar type ([place]) 04/25/2016    Past Surgical History:  Procedure Laterality Date  . APPENDECTOMY    . CHOLECYSTECTOMY N/A 10/20/2013   Procedure: LAPAROSCOPIC CHOLECYSTECTOMY;  Surgeon: [name], MD;  Location: [facility];  Service: General;  Laterality: N/A;  . HIP SURGERY    . KNEE SURGERY      Social history-He is divorced. Has one child. He is disabled. His POA is Ms. [name]. He reports that he has never smoked. He has never used smokeless tobacco. He reports that he does not drink alcohol or use drugs.  Allergies  Allergen Reactions  . Codeine Nausea And Vomiting  . Morphine And Related Nausea And Vomiting    Family History  Problem Relation Age of Onset  . Hypertension Mother   . Dementia Mother   His father died of CHF    Prior to Admission medications   Medication Sig Start Date End Date Taking? Authorizing Provider  AMITIZA 24 MCG capsule Take 1 capsule by mouth 2 (two) times daily. 08/06/16  Yes [provider]  aspirin EC 81 MG tablet Take 81 mg by mouth daily.  Yes [provider]  carvedilol (COREG) 3.125 MG tablet Take 1 tablet (3.125 mg total) by mouth 2 (two) times daily. 07/03/16 10/01/16 Yes Herminio Commons, MD  furosemide (LASIX) 40 MG tablet Alternate 40 mg one day and 20 mg the next 07/17/16  Yes Herminio Commons, MD  OLANZapine (ZYPREXA) 5 MG tablet Take 5 mg by mouth 2 (two) times daily.   Yes [provider]  pantoprazole (PROTONIX) 40 MG tablet Take 40 mg by mouth daily.   Yes [provider]  potassium  chloride SA (K-DUR,KLOR-CON) 20 MEQ tablet Take 20 mEq by mouth daily.   Yes [provider]  linagliptin (TRADJENTA) 5 MG TABS tablet Take 5 mg by mouth daily.    [provider]  rosuvastatin (CRESTOR) 10 MG tablet Take 1 tablet (10 mg total) by mouth daily. 07/16/16 10/14/16  Herminio Commons, MD  sacubitril-valsartan (ENTRESTO) 24-26 MG Take 1 tablet by mouth 2 (two) times daily. 07/16/16   Herminio Commons, MD    Physical Exam: Vitals:   08/16/16 1428 08/16/16 1500 08/16/16 1630 08/16/16 1735  BP: (!) 151/121 (!) 142/102 (!) 135/92 (!) 148/109  Pulse: 84 87 83 89  Resp: 19 20  18   Temp:    98.4 F (36.9 C)  TempSrc:    Oral  SpO2: 96% 96% 95% 96%  Weight:    81.8 kg (180 lb 6.4 oz)  Height:        Constitutional: NAD, calm, comfortable; Status post IV Lasix in the ED Vitals:   08/16/16 1428 08/16/16 1500 08/16/16 1630 08/16/16 1735  BP: (!) 151/121 (!) 142/102 (!) 135/92 (!) 148/109  Pulse: 84 87 83 89  Resp: 19 20  18   Temp:    98.4 F (36.9 C)  TempSrc:    Oral  SpO2: 96% 96% 95% 96%  Weight:    81.8 kg (180 lb 6.4 oz)  Height:       Eyes: PERRL, lids and conjunctivae normal ENMT: Mucous membranes are moist. Posterior pharynx clear of any exudate or lesions.Normal dentition.  Neck: normal, supple, no masses, no thyromegaly Respiratory: Few scattered crackles bilaterally. Normal respiratory effort. No accessory muscle use.  Cardiovascular: S1, S2, with ectopy. 1+ bilateral lower extremity pitting edema. 1+ pedal pulses. No carotid bruits.  Abdomen: no tenderness, no masses palpated. No hepatosplenomegaly. Bowel sounds positive.  Musculoskeletal: no clubbing / cyanosis. No joint deformity upper and lower extremities. Good ROM, no contractures. Normal muscle tone.  Skin: no rashes, lesions, ulcers. No induration Neurologic: CN 2-12 grossly intact. Sensation intact, DTR normal. Strength 5/5 in all 4.  Psychiatric: Normal judgment and insight. No  anxious mood or pressured speech. Alert and oriented x 3. Flat affect.     Labs on Admission: I have personally reviewed following labs and imaging studies  CBC:  Recent Labs Lab 08/16/16 1240  WBC 9.9  HGB 15.3  HCT 45.6  MCV 80.9  PLT 962   Basic Metabolic Panel:  Recent Labs Lab 08/16/16 1240  NA 142  K 3.9  CL 105  CO2 30  GLUCOSE 176*  BUN 16  CREATININE 1.29*  CALCIUM 9.2   GFR: Estimated Creatinine Clearance: 54.3 mL/min (A) (by C-G formula based on SCr of 1.29 mg/dL (H)). Liver Function Tests: No results for input(s): AST, ALT, ALKPHOS, BILITOT, PROT, ALBUMIN in the last 168 hours. No results for input(s): LIPASE, AMYLASE in the last 168 hours. No results for input(s): AMMONIA in the last 168  hours. Coagulation Profile: No results for input(s): INR, PROTIME in the last 168 hours. Cardiac Enzymes:  Recent Labs Lab 08/16/16 1240  TROPONINI 0.03*   BNP (last 3 results) No results for input(s): PROBNP in the last 8760 hours. HbA1C: No results for input(s): HGBA1C in the last 72 hours. CBG: No results for input(s): GLUCAP in the last 168 hours. Lipid Profile: No results for input(s): CHOL, HDL, LDLCALC, TRIG, CHOLHDL, LDLDIRECT in the last 72 hours. Thyroid Function Tests: No results for input(s): TSH, T4TOTAL, FREET4, T3FREE, THYROIDAB in the last 72 hours. Anemia Panel: No results for input(s): VITAMINB12, FOLATE, FERRITIN, TIBC, IRON, RETICCTPCT in the last 72 hours. Urine analysis:    Component Value Date/Time   COLORURINE YELLOW 04/24/2016 1502   APPEARANCEUR CLEAR 04/24/2016 1502   LABSPEC 1.021 04/24/2016 1502   PHURINE 6.0 04/24/2016 1502   GLUCOSEU 50 (A) 04/24/2016 1502   HGBUR NEGATIVE 04/24/2016 1502   BILIRUBINUR NEGATIVE 04/24/2016 1502   KETONESUR NEGATIVE 04/24/2016 1502   PROTEINUR 100 (A) 04/24/2016 1502   UROBILINOGEN 0.2 09/27/2013 2138   NITRITE NEGATIVE 04/24/2016 1502   LEUKOCYTESUR NEGATIVE 04/24/2016 1502     Radiological Exams on Admission: Dg Chest 2 View  Result Date: 08/16/2016 CLINICAL DATA:  Worsening shortness of breath last night, history hypertension, diabetes mellitus EXAM: CHEST  2 VIEW COMPARISON:  05/28/2016 FINDINGS: Enlargement of cardiac silhouette with pulmonary vascular congestion. Mediastinal contour stable. Diffuse interstitial infiltrates likely pulmonary edema. No segmental consolidation, pleural effusion, or pneumothorax. Bones demineralized. IMPRESSION: Enlargement of cardiac silhouette with pulmonary vascular congestion and probable pulmonary edema. Electronically Signed   By: Lavonia Dana M.D.   On: 08/16/2016 12:23    EKG: Independently reviewed. Normal sinus rhythm, Heart rate 94, PVCs, left axis deviation.  Assessment/Plan Principal Problem:   Acute on chronic combined systolic and diastolic CHF (congestive heart failure) (HCC) Active Problems:   CAD (coronary artery disease)   Diabetes mellitus with complication, without long-term current use of insulin (HCC)   Essential hypertension   Schizoaffective disorder, bipolar type (HCC)   OSA (obstructive sleep apnea)   Acute renal insufficiency    Patient is a 66 year old man with a known history of combined heart failure with the recent at 05/2016 revealing an EF of 20-25% and grade 2 diastolic dysfunction. Recent nuclear stress test on 07/08/16 revealed a large area of myocardial scar with no evidence of ischemia and EF of 33%. He is followed by cardiologist, Dr. Bronson Ing. He is treated with Lasix-which was recently decreased from 40 mg daily to 40 mg alternating with 20 mg-per Ms. Toms due to acute renal insufficiency, carvedilol, aspirin, and Entresto. Lisinopril was recently discontinued. He presents with acute on chronic combined heart failure with pulmonary edema on the chest x-ray, elevated BNP, and marginally elevated troponin I which is likely from demand ischemia or CHF. He was given 40 mg of Lasix IV in the  ED and has already diuresed more than 1 L (estimate).   1. Continue Lasix IV at 40 mg daily. 2. Continue chronic cardiac medications, aspirin, carvedilol, and Entresto. 3. Strict I's and O's and daily weights. 4. Will allow modest increase in creatinine in the setting of acute CHF, but monitor renal function closely. -His creatinine was 1.07 three  months ago, 1.4 for one month ago, and 1.29 on admission. 5. Hold oral diabetes medication and treat diabetes with sliding scale NovoLog. 6. Continue Zyprexa for treatment of bipolar disorder. 7. Continue statin. 8. For further evaluation, will  cycle cardiac enzyme, order TSH, hemoglobin A1c, and fasting lipid panel. 9. Will consult cardiology for further management recommendations.    DVT prophylaxis: Lovenox Code Status: Full code Family Communication: Discussed with cousin/POA [name] Disposition Plan: Discharge when clinically appropriate, likely in a few days. Consults called: Cardiology, pending Admission status: Inpatient   [facility] MD [facility] Pager [phone]  If 7PM-7AM, please contact night-coverage [email or web address] Password [email or web address]  08/16/2016, 6:00 PM

## 2016-08-17 DIAGNOSIS — R062 Wheezing: Secondary | ICD-10-CM

## 2016-08-17 DIAGNOSIS — I519 Heart disease, unspecified: Secondary | ICD-10-CM

## 2016-08-17 DIAGNOSIS — E7800 Pure hypercholesterolemia, unspecified: Secondary | ICD-10-CM

## 2016-08-17 DIAGNOSIS — I25708 Atherosclerosis of coronary artery bypass graft(s), unspecified, with other forms of angina pectoris: Secondary | ICD-10-CM

## 2016-08-17 LAB — HEPATIC FUNCTION PANEL
ALK PHOS: 111 U/L (ref 38–126)
ALT: 26 U/L (ref 17–63)
AST: 20 U/L (ref 15–41)
Albumin: 3.4 g/dL — ABNORMAL LOW (ref 3.5–5.0)
BILIRUBIN DIRECT: 0.1 mg/dL (ref 0.1–0.5)
BILIRUBIN TOTAL: 1.2 mg/dL (ref 0.3–1.2)
Indirect Bilirubin: 1.1 mg/dL — ABNORMAL HIGH (ref 0.3–0.9)
Total Protein: 6.6 g/dL (ref 6.5–8.1)

## 2016-08-17 LAB — CBC
HEMATOCRIT: 46 % (ref 39.0–52.0)
Hemoglobin: 15.4 g/dL (ref 13.0–17.0)
MCH: 26.8 pg (ref 26.0–34.0)
MCHC: 33.5 g/dL (ref 30.0–36.0)
MCV: 80 fL (ref 78.0–100.0)
PLATELETS: 160 10*3/uL (ref 150–400)
RBC: 5.75 MIL/uL (ref 4.22–5.81)
RDW: 15.8 % — ABNORMAL HIGH (ref 11.5–15.5)
WBC: 9.4 10*3/uL (ref 4.0–10.5)

## 2016-08-17 LAB — PROTIME-INR
INR: 1.11
PROTHROMBIN TIME: 14.3 s (ref 11.4–15.2)

## 2016-08-17 LAB — GLUCOSE, CAPILLARY
GLUCOSE-CAPILLARY: 166 mg/dL — AB (ref 65–99)
GLUCOSE-CAPILLARY: 158 mg/dL — AB (ref 65–99)
GLUCOSE-CAPILLARY: 157 mg/dL — AB (ref 65–99)
Glucose-Capillary: 167 mg/dL — ABNORMAL HIGH (ref 65–99)

## 2016-08-17 LAB — BASIC METABOLIC PANEL
Anion gap: 11 (ref 5–15)
BUN: 18 mg/dL (ref 6–20)
CALCIUM: 8.8 mg/dL — AB (ref 8.9–10.3)
CHLORIDE: 101 mmol/L (ref 101–111)
CO2: 25 mmol/L (ref 22–32)
CREATININE: 1.35 mg/dL — AB (ref 0.61–1.24)
GFR calc Af Amer: >60 mL/min (ref >60)
GFR calc non Af Amer: 53 mL/min — ABNORMAL LOW (ref >60)
GLUCOSE: 244 mg/dL — AB (ref 65–99)
Potassium: 3.6 mmol/L (ref 3.5–5.1)
Sodium: 137 mmol/L (ref 135–145)

## 2016-08-17 LAB — LIPID PANEL
Cholesterol: 182 mg/dL (ref 0–200)
HDL: 31 mg/dL — ABNORMAL LOW (ref >40)
LDL CALC: 114 mg/dL — AB (ref 0–99)
Total CHOL/HDL Ratio: 5.9 RATIO
Triglycerides: 186 mg/dL — ABNORMAL HIGH (ref <150)
VLDL: 37 mg/dL (ref 0–40)

## 2016-08-17 LAB — TROPONIN I
Troponin I: 0.03 ng/mL (ref <0.03)
Troponin I: 0.03 ng/mL (ref <0.03)

## 2016-08-17 MED ORDER — ROSUVASTATIN CALCIUM 20 MG PO TABS
20.0000 mg | ORAL_TABLET | Freq: Every day | ORAL | Status: DC
  Administered 2016-08-18: 20 mg via ORAL
  Filled 2016-08-17: qty 1

## 2016-08-17 MED ORDER — CARVEDILOL 3.125 MG PO TABS
6.2500 mg | ORAL_TABLET | Freq: Two times a day (BID) | ORAL | Status: DC
  Administered 2016-08-17 – 2016-08-18 (×3): 6.25 mg via ORAL
  Filled 2016-08-17 (×3): qty 2

## 2016-08-17 MED ORDER — ALBUTEROL SULFATE (2.5 MG/3ML) 0.083% IN NEBU: 2.5000 mg | INHALATION_SOLUTION | Freq: Three times a day (TID) | RESPIRATORY_TRACT | Status: DC | PRN

## 2016-08-17 NOTE — Plan of Care (Signed)
Problem: Food- and Nutrition-Related Knowledge Deficit (NB-1.1) Goal: Nutrition education Formal process to instruct or train a patient/client in a skill or to impart knowledge to help patients/clients voluntarily manage or modify food choices and eating behavior to maintain or improve health. Outcome: Adequate for Discharge Nutrition Education Note  RD consulted for nutrition education regarding acute onset CHF.   RD provided "Heart Failure Nutrition Therapy" handout from the Academy of [employer] Dietetics.   Reviewed patient's dietary recall. Mr [name] lives alone and does not like to cook. He eats breakfast out [employer]	[place] or [employer] daily. Breakfast is his largest meal and usually includes 2-3 eggs, (ham, bacon or sausage) biscuit and grits. At lunch and dinner he likes [employer] grape chicken salad and crackers and drinks fluids like Gatorade.   Provided examples on ways to decrease sodium intake in diet  such as eliminating the cured meats at breakfast . Discouraged intake of processed foods, salted crackers, canned soups and use of salt shaker. Suggested he ask the cooks at his favorite daily breakfast locations to avoid adding salt when preparing his food. Encouraged fresh fruits and vegetables as well as whole grain sources of carbohydrates to maximize fiber intake.   RD discussed why it is important for patient to adhere to diet recommendations, and emphasized the role of fluids, foods to avoid (especially cured meats and excess fluids), and importance of weighing self daily. Teach back method used.  Expect fair compliance. Mr [name] was receptive to education but not hopeful that he can make lasting recommended changes. He would benefit from encouragement from nursing and support that making  diet changes are achievable. Also suggest provide pt with fluid intake goal for discharge if appropriate. He says he drinks "quite a bit" fluid daily and was unaware that could contribute to fluid build  up.   Body mass index is 30.84 kg/m. Pt meets criteria for obese based on current BMI.  Current diet order is Heart Healthy/CHO modified, patient is consuming approximately 100% of meals at this time.   No further nutrition interventions warranted at this time.    Colman Cater MS,RD,CSG,LDN Office: [phone] Pager: [phone]

## 2016-08-17 NOTE — Progress Notes (Signed)
Provided pt with the Living Better with Heart Failure patient handbook, reviewed information inside and a new scale to weigh daily.

## 2016-08-17 NOTE — Progress Notes (Signed)
Subjective: He was admitted yesterday with heart failure exacerbation. He says he feels a little better. His diuretics have been decreased because of renal dysfunction but then he developed increased heart failure. His blood pressure has been markedly elevated here. Renal function has shown some deterioration after IV Lasix yesterday but it's minimal. No chest pain. No other new complaints.  Objective: Vital signs in last 24 hours: Temp:  [98 F (36.7 C)-98.7 F (37.1 C)] 98.5 F (36.9 C) (07/02 0611) Pulse Rate:  [83-89] 83 (07/02 0611) Resp:  [16-21] 18 (07/02 0611) BP: (135-189)/(80-121) 138/80 (07/02 0611) SpO2:  [95 %-100 %] 98 % (07/02 0611) Weight:  [81.5 kg (179 lb 10.8 oz)-83.9 kg (185 lb)] 81.5 kg (179 lb 10.8 oz) (07/02 0611) Weight change:  Last BM Date: 08/14/16  Intake/Output from previous day: 07/01 0701 - 07/02 0700 In: 243 [P.O.:240; I.V.:3] Out: 1050 [Urine:1050]  PHYSICAL EXAM General appearance: alert, cooperative and mild distress Resp: clear to auscultation bilaterally Cardio: regular rate and rhythm, S1, S2 normal, no murmur, click, rub or gallop GI: soft, non-tender; bowel sounds normal; no masses,  no organomegaly Extremities: He still has 1+ edema bilaterally He has some diffuse rash but he received Benadryl and that's better  Lab Results:  Results for orders placed or performed during the hospital encounter of 08/16/16 (from the past 48 hour(s))  Basic metabolic panel     Status: Abnormal   Collection Time: 08/16/16 12:40 PM  Result Value Ref Range   Sodium 142 135 - 145 mmol/L   Potassium 3.9 3.5 - 5.1 mmol/L   Chloride 105 101 - 111 mmol/L   CO2 30 22 - 32 mmol/L   Glucose, Bld 176 (H) 65 - 99 mg/dL   BUN 16 6 - 20 mg/dL   Creatinine, Ser 1.29 (H) 0.61 - 1.24 mg/dL   Calcium 9.2 8.9 - 10.3 mg/dL   GFR calc non Af Amer 56 (L) >60 mL/min   GFR calc Af Amer >60 >60 mL/min    Comment: (NOTE) The eGFR has been calculated using the CKD EPI  equation. This calculation has not been validated in all clinical situations. eGFR's persistently <60 mL/min signify possible Chronic Kidney Disease.    Anion gap 7 5 - 15  CBC     Status: Abnormal   Collection Time: 08/16/16 12:40 PM  Result Value Ref Range   WBC 9.9 4.0 - 10.5 K/uL   RBC 5.64 4.22 - 5.81 MIL/uL   Hemoglobin 15.3 13.0 - 17.0 g/dL   HCT 45.6 39.0 - 52.0 %   MCV 80.9 78.0 - 100.0 fL   MCH 27.1 26.0 - 34.0 pg   MCHC 33.6 30.0 - 36.0 g/dL   RDW 15.9 (H) 11.5 - 15.5 %   Platelets 152 150 - 400 K/uL    Comment: PLATELET COUNT CONFIRMED BY SMEAR GIANT PLATELETS SEEN   Troponin I     Status: Abnormal   Collection Time: 08/16/16 12:40 PM  Result Value Ref Range   Troponin I 0.03 (HH) <0.03 ng/mL    Comment: CRITICAL RESULT CALLED TO, READ BACK BY AND VERIFIED WITH: KENDRICK,J. AT [id number] ON 08/16/2016 BY EVA   Brain natriuretic peptide     Status: Abnormal   Collection Time: 08/16/16 12:40 PM  Result Value Ref Range   B Natriuretic Peptide 1,303.0 (H) 0.0 - 100.0 pg/mL  Urinalysis, Routine w reflex microscopic     Status: Abnormal   Collection Time: 08/16/16  8:36 PM  Result Value  Ref Range   Color, Urine YELLOW YELLOW   APPearance CLEAR CLEAR   Specific Gravity, Urine 1.013 1.005 - 1.030   pH 5.0 5.0 - 8.0   Glucose, UA NEGATIVE NEGATIVE mg/dL   Hgb urine dipstick NEGATIVE NEGATIVE   Bilirubin Urine NEGATIVE NEGATIVE   Ketones, ur NEGATIVE NEGATIVE mg/dL   Protein, ur >=300 (A) NEGATIVE mg/dL   Nitrite NEGATIVE NEGATIVE   Leukocytes, UA NEGATIVE NEGATIVE   RBC / HPF 0-5 0 - 5 RBC/hpf   WBC, UA 0-5 0 - 5 WBC/hpf   Bacteria, UA NONE SEEN NONE SEEN   Squamous Epithelial / LPF 0-5 (A) NONE SEEN   Mucous PRESENT   Glucose, capillary     Status: Abnormal   Collection Time: 08/16/16  9:28 PM  Result Value Ref Range   Glucose-Capillary 152 (H) 65 - 99 mg/dL   Comment 1 Notify RN    Comment 2 Document in Chart   Troponin I     Status: Abnormal   Collection  Time: 08/16/16 10:34 PM  Result Value Ref Range   Troponin I 0.03 (HH) <0.03 ng/mL    Comment: CRITICAL VALUE NOTED.  VALUE IS CONSISTENT WITH PREVIOUSLY REPORTED AND CALLED VALUE.  TSH     Status: None   Collection Time: 08/16/16 10:35 PM  Result Value Ref Range   TSH 1.045 0.350 - 4.500 uIU/mL    Comment: Performed by a 3rd Generation assay with a functional sensitivity of <=0.01 uIU/mL.  Basic metabolic panel     Status: Abnormal   Collection Time: 08/17/16  5:17 AM  Result Value Ref Range   Sodium 137 135 - 145 mmol/L   Potassium 3.6 3.5 - 5.1 mmol/L   Chloride 101 101 - 111 mmol/L   CO2 25 22 - 32 mmol/L   Glucose, Bld 244 (H) 65 - 99 mg/dL   BUN 18 6 - 20 mg/dL   Creatinine, Ser 1.35 (H) 0.61 - 1.24 mg/dL   Calcium 8.8 (L) 8.9 - 10.3 mg/dL   GFR calc non Af Amer 53 (L) >60 mL/min   GFR calc Af Amer >60 >60 mL/min    Comment: (NOTE) The eGFR has been calculated using the CKD EPI equation. This calculation has not been validated in all clinical situations. eGFR's persistently <60 mL/min signify possible Chronic Kidney Disease.    Anion gap 11 5 - 15  Hepatic function panel     Status: Abnormal   Collection Time: 08/17/16  5:17 AM  Result Value Ref Range   Total Protein 6.6 6.5 - 8.1 g/dL   Albumin 3.4 (L) 3.5 - 5.0 g/dL   AST 20 15 - 41 U/L   ALT 26 17 - 63 U/L   Alkaline Phosphatase 111 38 - 126 U/L   Total Bilirubin 1.2 0.3 - 1.2 mg/dL   Bilirubin, Direct 0.1 0.1 - 0.5 mg/dL   Indirect Bilirubin 1.1 (H) 0.3 - 0.9 mg/dL  Protime-INR     Status: None   Collection Time: 08/17/16  5:17 AM  Result Value Ref Range   Prothrombin Time 14.3 11.4 - 15.2 seconds   INR 1.11   Lipid panel     Status: Abnormal   Collection Time: 08/17/16  5:17 AM  Result Value Ref Range   Cholesterol 182 0 - 200 mg/dL   Triglycerides 186 (H) <150 mg/dL   HDL 31 (L) >40 mg/dL   Total CHOL/HDL Ratio 5.9 RATIO   VLDL 37 0 - 40 mg/dL  LDL Cholesterol 114 (H) 0 - 99 mg/dL    Comment:         Total Cholesterol/HDL:CHD Risk Coronary Heart Disease Risk Table                     Men   Women  1/2 Average Risk   3.4   3.3  Average Risk       5.0   4.4  2 X Average Risk   9.6   7.1  3 X Average Risk  23.4   11.0        Use the calculated Patient Ratio above and the CHD Risk Table to determine the patient's CHD Risk.        ATP III CLASSIFICATION (LDL):  <100     mg/dL   Optimal  100-129  mg/dL   Near or Above                    Optimal  130-159  mg/dL   Borderline  160-189  mg/dL   High  >190     mg/dL   Very High   CBC     Status: Abnormal   Collection Time: 08/17/16  5:17 AM  Result Value Ref Range   WBC 9.4 4.0 - 10.5 K/uL   RBC 5.75 4.22 - 5.81 MIL/uL   Hemoglobin 15.4 13.0 - 17.0 g/dL   HCT 46.0 39.0 - 52.0 %   MCV 80.0 78.0 - 100.0 fL   MCH 26.8 26.0 - 34.0 pg   MCHC 33.5 30.0 - 36.0 g/dL   RDW 15.8 (H) 11.5 - 15.5 %   Platelets 160 150 - 400 K/uL    Comment: PLATELET COUNT CONFIRMED BY SMEAR SPECIMEN CHECKED FOR CLOTS   Troponin I     Status: Abnormal   Collection Time: 08/17/16  5:17 AM  Result Value Ref Range   Troponin I 0.03 (HH) <0.03 ng/mL    Comment: CRITICAL VALUE NOTED.  VALUE IS CONSISTENT WITH PREVIOUSLY REPORTED AND CALLED VALUE.  Glucose, capillary     Status: Abnormal   Collection Time: 08/17/16  7:28 AM  Result Value Ref Range   Glucose-Capillary 166 (H) 65 - 99 mg/dL    ABGS No results for input(s): PHART, PO2ART, TCO2, HCO3 in the last 72 hours.  Invalid input(s): PCO2 CULTURES No results found for this or any previous visit (from the past 240 hour(s)). Studies/Results: Dg Chest 2 View  Result Date: 08/16/2016 CLINICAL DATA:  Worsening shortness of breath last night, history hypertension, diabetes mellitus EXAM: CHEST  2 VIEW COMPARISON:  05/28/2016 FINDINGS: Enlargement of cardiac silhouette with pulmonary vascular congestion. Mediastinal contour stable. Diffuse interstitial infiltrates likely pulmonary edema. No segmental  consolidation, pleural effusion, or pneumothorax. Bones demineralized. IMPRESSION: Enlargement of cardiac silhouette with pulmonary vascular congestion and probable pulmonary edema. Electronically Signed   By: Lavonia Dana M.D.   On: 08/16/2016 12:23    Medications:  Prior to Admission:  Prescriptions Prior to Admission  Medication Sig Dispense Refill Last Dose  . AMITIZA 24 MCG capsule Take 1 capsule by mouth 2 (two) times daily.   08/16/2016 at Unknown time  . aspirin EC 81 MG tablet Take 81 mg by mouth daily.   08/16/2016 at Unknown time  . carvedilol (COREG) 3.125 MG tablet Take 1 tablet (3.125 mg total) by mouth 2 (two) times daily. 180 tablet 3 08/16/2016 at 0830  . furosemide (LASIX) 40 MG tablet Alternate 40 mg one day  and 20 mg the next 90 tablet 3 08/16/2016 at Unknown time  . OLANZapine (ZYPREXA) 5 MG tablet Take 5 mg by mouth 2 (two) times daily.   08/16/2016 at Unknown time  . pantoprazole (PROTONIX) 40 MG tablet Take 40 mg by mouth daily.   08/16/2016 at Unknown time  . potassium chloride SA (K-DUR,KLOR-CON) 20 MEQ tablet Take 20 mEq by mouth daily.   08/16/2016 at Unknown time  . linagliptin (TRADJENTA) 5 MG TABS tablet Take 5 mg by mouth daily.   unknown  . rosuvastatin (CRESTOR) 10 MG tablet Take 1 tablet (10 mg total) by mouth daily. 90 tablet 3   . sacubitril-valsartan (ENTRESTO) 24-26 MG Take 1 tablet by mouth 2 (two) times daily. 60 tablet 6    Scheduled: . aspirin EC  81 mg Oral Daily  . carvedilol  6.25 mg Oral BID  . docusate sodium  100 mg Oral BID  . enoxaparin (LOVENOX) injection  40 mg Subcutaneous Q24H  . furosemide  40 mg Intravenous Daily  . insulin aspart  0-15 Units Subcutaneous TID WC  . insulin aspart  0-5 Units Subcutaneous QHS  . lubiprostone  24 mcg Oral BID  . OLANZapine  5 mg Oral BID  . pantoprazole  40 mg Oral Daily  . potassium chloride  20 mEq Oral Daily  . rosuvastatin  10 mg Oral Daily  . sacubitril-valsartan  1 tablet Oral BID  . sodium chloride flush   3 mL Intravenous Q12H   Continuous:  [id number] **OR** acetaminophen, albuterol, diphenhydrAMINE, hydrALAZINE, ondansetron **OR** ondansetron (ZOFRAN) IV  Assesment: He has acute on chronic combined systolic and diastolic heart failure. He's a little bit better after IV Lasix. He has acute on chronic renal insufficiency and his renal function is a little bit worse this morning. He has hypertension and his blood pressure has not been controlled so I have increased his carvedilol. He has schizoaffective disorder with bipolar features which complicates his care. He has diabetes and his blood sugars up he is on sliding scale insulin at this point Principal Problem:   Acute on chronic combined systolic and diastolic CHF (congestive heart failure) (HCC) Active Problems:   Diabetes mellitus with complication, without long-term current use of insulin (HCC)   Essential hypertension   Schizoaffective disorder, bipolar type (HCC)   OSA (obstructive sleep apnea)   Acute renal insufficiency   CAD (coronary artery disease)    Plan: Continue treatments. Request cardiology consultation. I increased carvedilol that he will probably need bigger doses ongoing. He is on when necessary hydralazine for blood pressure    LOS: 1 day   Monterio Bob L 08/17/2016, 8:33 AM

## 2016-08-17 NOTE — Consult Note (Addendum)
Cardiology Consultation:   Patient ID: [name]; [id number]; [birth year]   Admit date: 08/16/2016 Date of Consult: 08/17/2016  Primary Care Provider: Sinda Du, MD Primary Cardiologist: Bronson Ing   Patient Profile:   [name] is a 66 y.o. male with a hx of chronic systolic and diastolic heart failure with severe left ventricular systolic dysfunctionwho is being seen today for the evaluation of CHF at the request of Dr. Luan Pulling.  History of Present Illness:   [name] is a 66 y.o. 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  I last saw him on 07/16/16. At that time I stopped lisinopril and started Entresto 24/26 mg bid. I also started ASA and Crestor. He is on Coreg. However, I learned on 6/12 that he never stopped lisinopril. Also has schizoaffective disorder with bipolar features and h/o medication noncompliance. I also held Entresto due to worsening renal function and reduced Lasix to 40 mg and 20 mg on alternate days. Creatinine 1.44 on 07/17/16. Yesterday down to 1.29.  He has had 2 echocardiograms this year. The first one on 03/06/16 showed normal left ventricle systolic function and regional wall motion, LVEF 55-60%, moderate LVH, and grade 1 diastolic dysfunction.  The second echocardiogram performed on 06/08/16 showed severely reduced left ventricular systolic function, LV EF 35-46%, diffuse hypokinesis, mild to moderate LVH, grade 2 diastolic dysfunction, mild mitral regurgitation and probably mild aortic stenosis with mild aortic regurgitation.  Nuclear stress test 07/08/16 showed large area of scar without ischemia.  He was admitted yesterday with worsening shortness of breath and accelerating hypertension.   CXR showed  CHF.  BNP elevated, 1303 (716 3 months ago).   Troponins 0.03 x 4.  Lipids showed elevated LDL, 114.  Started on IV Lasix.  Coreg increased today by Dr. Luan Pulling.  He drinks about 3 bottles of Gatorade daily and occasionally has fast food. Eats canned soups in winter.  He now denies chest pain and leg swelling and says his shortness of breath has improved.  Past Medical History:  Diagnosis Date  . Arthritis   . Bipolar disorder ([place])   . CAD (coronary artery disease) 08/16/2016  . Cellulitis and abscess of foot 03/05/2016  . Chronic combined systolic and diastolic heart failure ([place])   . Diabetes mellitus without complication ([place])    boderline  . Heart murmur 1995  . Hypertension   . Kidney stones   . Myocardial infarction ([place])   . OSA (obstructive sleep apnea)   . Schizoaffective disorder, bipolar type ([place]) 04/25/2016    Past Surgical History:  Procedure Laterality Date  . APPENDECTOMY    . CHOLECYSTECTOMY N/A 10/20/2013   Procedure: LAPAROSCOPIC CHOLECYSTECTOMY;  Surgeon: [name], MD;  Location: [facility];  Service: General;  Laterality: N/A;  . HIP SURGERY    . KNEE SURGERY       Inpatient Medications: Scheduled Meds: . aspirin EC  81 mg Oral Daily  . carvedilol  6.25 mg Oral BID  . docusate sodium  100 mg Oral BID  . enoxaparin (LOVENOX) injection  40 mg Subcutaneous Q24H  . furosemide  40 mg Intravenous Daily  . insulin aspart  0-15 Units Subcutaneous TID WC  . insulin aspart  0-5 Units Subcutaneous QHS  .  lubiprostone  24 mcg Oral BID  . OLANZapine  5 mg Oral BID  . pantoprazole  40 mg Oral Daily  . potassium chloride  20 mEq Oral Daily  . rosuvastatin  10 mg Oral Daily  . sacubitril-valsartan  1 tablet Oral BID  . sodium chloride flush  3 mL Intravenous Q12H   Continuous Infusions:  PRN Meds: acetaminophen **OR** acetaminophen, albuterol, diphenhydrAMINE, hydrALAZINE, ondansetron **OR** ondansetron (ZOFRAN) IV  Allergies:    Allergies  Allergen  Reactions  . Codeine Nausea And Vomiting  . Morphine And Related Nausea And Vomiting    Social History:   Social History   Social History  . Marital status: Divorced    Spouse name: N/A  . Number of children: N/A  . Years of education: N/A   Occupational History  . Not on file.   Social History Main Topics  . Smoking status: Never Smoker  . Smokeless tobacco: Never Used  . Alcohol use No  . Drug use: No  . Sexual activity: Not on file   Other Topics Concern  . Not on file   Social History Narrative  . No narrative on file    Family History:   The patient's family history includes Dementia in his mother; Hypertension in his mother.  ROS:  Please see the history of present illness.  ROS  All other ROS reviewed and negative.     Physical Exam/Data:   Vitals:   08/16/16 1630 08/16/16 1735 08/16/16 2130 08/17/16 0611  BP: (!) 135/92 (!) 148/109 (!) 171/85 138/80  Pulse: 83 89 89 83  Resp:  18  18  Temp:  98.4 F (36.9 C) 98.7 F (37.1 C) 98.5 F (36.9 C)  TempSrc:  Oral Oral Oral  SpO2: 95% 96% 99% 98%  Weight:  180 lb 6.4 oz (81.8 kg)  179 lb 10.8 oz (81.5 kg)  Height:        Intake/Output Summary (Last 24 hours) at 08/17/16 1234 Last data filed at 08/17/16 1006  Gross per 24 hour  Intake              243 ml  Output             2150 ml  Net            -1907 ml   Filed Weights   08/16/16 1149 08/16/16 1735 08/17/16 0611  Weight: 185 lb (83.9 kg) 180 lb 6.4 oz (81.8 kg) 179 lb 10.8 oz (81.5 kg)   Body mass index is 30.84 kg/m.  General:  Well nourished, well developed, in no acute distress HEENT: normal Lymph: no adenopathy Neck: no JVD Endocrine:  No thryomegaly Cardiac:  normal S1, S2; RRR; no murmur  Lungs:  Bilateral inspiratory and expiratory wheezing. Abd: soft, nontender, no hepatomegaly  Ext: no edema Musculoskeletal:  No deformities, BUE and BLE strength normal and equal Skin: warm and dry  Neuro:  CNs 2-12 intact, no focal  abnormalities noted Psych:  Normal affect   EKG:  The EKG was personally reviewed and demonstrates:  NSR with PVC's and LVH with repolarization abnormalities. Telemetry:  Telemetry was personally reviewed and demonstrates:  NSR with PVC's.  Relevant CV Studies:  Nuclear stress test 07/08/16:  Defect 1: There is a large defect of moderate severity present in the basal anterior, basal inferoseptal, basal inferior, basal inferolateral, mid anterior, mid inferoseptal, mid inferior, mid inferolateral, apical anterior, apical septal and apical inferior location.  Findings consistent with prior myocardial infarction.  No ischemia.  This is a high risk study.  Nuclear stress EF: 33%.  Echocardiograms reviewed above.  Laboratory Data:  Chemistry Recent Labs Lab 08/16/16 1240 08/17/16 0517  NA 142 137  K 3.9 3.6  CL 105 101  CO2 30 25  GLUCOSE 176* 244*  BUN 16 18  CREATININE 1.29* 1.35*  CALCIUM 9.2 8.8*  GFRNONAA 56* 53*  GFRAA >60 >60  ANIONGAP 7 11     Recent Labs Lab 08/17/16 0517  PROT 6.6  ALBUMIN 3.4*  AST 20  ALT 26  ALKPHOS 111  BILITOT 1.2   Hematology Recent Labs Lab 08/16/16 1240 08/17/16 0517  WBC 9.9 9.4  RBC 5.64 5.75  HGB 15.3 15.4  HCT 45.6 46.0  MCV 80.9 80.0  MCH 27.1 26.8  MCHC 33.6 33.5  RDW 15.9* 15.8*  PLT 152 160   Cardiac Enzymes Recent Labs Lab 08/16/16 1240 08/16/16 2234 08/17/16 0517 08/17/16 1042  TROPONINI 0.03* 0.03* 0.03* 0.03*   No results for input(s): TROPIPOC in the last 168 hours.  BNP Recent Labs Lab 08/16/16 1240  BNP 1,303.0*    DDimer No results for input(s): DDIMER in the last 168 hours.  Radiology/Studies:  Dg Chest 2 View  Result Date: 08/16/2016 CLINICAL DATA:  Worsening shortness of breath last night, history hypertension, diabetes mellitus EXAM: CHEST  2 VIEW COMPARISON:  05/28/2016 FINDINGS: Enlargement of cardiac silhouette with pulmonary vascular congestion. Mediastinal contour stable. Diffuse  interstitial infiltrates likely pulmonary edema. No segmental consolidation, pleural effusion, or pneumothorax. Bones demineralized. IMPRESSION: Enlargement of cardiac silhouette with pulmonary vascular congestion and probable pulmonary edema. Electronically Signed   By: Lavonia Dana M.D.   On: 08/16/2016 12:23    Assessment and Plan:   1. 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 Agree with higher dose of Coreg for BP control. Continue Entresto. Nuclear stress test showed significant myocardial scar. Diet detailed above. I will obtain a nutrition consult for dietary education about low sodium diet. I discussed this with him and provided education as well.  2. Accelerated hypertension: Agree with higher dose of Coreg for BP control. Continue Entresto at present dose for now.  3. OSA: Noncompliant with CPAP. This is worsening hypertension.  4. CAD with prior MI: Symptomatically stable. Nuclear stress test results detailed above no evidence of ischemia. Continue ASA and Coreg. LDL is elevated to 114 so I will increase Crestor to 20 mg.  5. Cardiomyopathy: I will repeat an echocardiogram in September 2018 to assess for LVEF improvement. If it remains severely reduced, I will refer to EP for AICD.  6. Hyperlipidemia: LDL is elevated to 114 so I will increase Crestor to 20 mg.  7. Wheezing: I will administer albuterol neb now. No h/o smoking. Has seasonal allergies. May be allergic asthma.   Signed, Kate Sable, MD  08/17/2016 12:34 PM

## 2016-08-18 DIAGNOSIS — I25118 Atherosclerotic heart disease of native coronary artery with other forms of angina pectoris: Secondary | ICD-10-CM

## 2016-08-18 DIAGNOSIS — N183 Chronic kidney disease, stage 3 (moderate): Secondary | ICD-10-CM

## 2016-08-18 DIAGNOSIS — E782 Mixed hyperlipidemia: Secondary | ICD-10-CM

## 2016-08-18 DIAGNOSIS — N179 Acute kidney failure, unspecified: Secondary | ICD-10-CM | POA: Diagnosis present

## 2016-08-18 LAB — GLUCOSE, CAPILLARY
GLUCOSE-CAPILLARY: 155 mg/dL — AB (ref 65–99)
GLUCOSE-CAPILLARY: 160 mg/dL — AB (ref 65–99)

## 2016-08-18 LAB — HEMOGLOBIN A1C
HEMOGLOBIN A1C: 8 % — AB (ref 4.8–5.6)
Mean Plasma Glucose: 183 mg/dL

## 2016-08-18 LAB — BASIC METABOLIC PANEL
Anion gap: 9 (ref 5–15)
BUN: 19 mg/dL (ref 6–20)
CO2: 27 mmol/L (ref 22–32)
CREATININE: 1.43 mg/dL — AB (ref 0.61–1.24)
Calcium: 8.9 mg/dL (ref 8.9–10.3)
Chloride: 100 mmol/L — ABNORMAL LOW (ref 101–111)
GFR, EST AFRICAN AMERICAN: 57 mL/min — AB (ref >60)
GFR, EST NON AFRICAN AMERICAN: 50 mL/min — AB (ref >60)
Glucose, Bld: 158 mg/dL — ABNORMAL HIGH (ref 65–99)
Potassium: 3.5 mmol/L (ref 3.5–5.1)
SODIUM: 136 mmol/L (ref 135–145)

## 2016-08-18 MED ORDER — CARVEDILOL 6.25 MG PO TABS: 6.2500 mg | ORAL_TABLET | Freq: Two times a day (BID) | ORAL | 12 refills | Status: DC

## 2016-08-18 NOTE — Care Management Note (Signed)
Case Management Note  Patient Details  Name: [name] MRN: [id number] Date of Birth: 11/18/1950  Subjective/Objective:   Adm with CHF exacerbation. From home alone, ind PTA. Has [place], [place]. Patient no longer requiring oxygen. He has been given a new weighing scale to take home. CM discussed [facility] calls with patient, he is agreeable. Per notes, it appears [facility] has reached out to patient back in March. Patient reports he spent 2 months at [facility].                Action/Plan: CM will consult [facility] for EMMI transition calls. Home health is not appropriate as patient is independent and still drives to appointments.    Expected Discharge Date:  08/18/16               Expected Discharge Plan:  Home/Self Care  In-House Referral:     Discharge planning Services  CM Consult, Other - See comment [facility] referral)  Post Acute Care Choice:    Choice offered to:  NA  DME Arranged:    DME Agency:     HH Arranged:    HH Agency:     Status of Service:  Completed, signed off  If discussed at [employer] of Stay Meetings, dates discussed:    Additional Comments:  Paula Zietz, Chauncey Reading, RN 08/18/2016, 11:19 AM

## 2016-08-18 NOTE — Progress Notes (Signed)
Late Entry for 1505    Patient discharged with instructions, prescription, and care notes.  Verbalized understanding via teach back.  IV was removed and the site was WNL. Patient voiced no further complaints or concerns at the time of discharge.  Appointments scheduled per instructions.  Patient left the floor via w/c family  And staff in stable condition.

## 2016-08-18 NOTE — Progress Notes (Signed)
Subjective: He says he feels much better. Much less short of breath. His blood pressure has been better. He feels like his legs have gone down. He's not having any chest pain. No other new complaints.  Objective: Vital signs in last 24 hours: Temp:  [97.6 F (36.4 C)-98 F (36.7 C)] 97.8 F (36.6 C) (07/03 0450) Pulse Rate:  [80-88] 80 (07/03 0450) Resp:  [16-18] 16 (07/03 0450) BP: (116-126)/(62-77) 116/62 (07/03 0450) SpO2:  [94 %-100 %] 100 % (07/03 0450) Weight:  [80.8 kg (178 lb 2.4 oz)] 80.8 kg (178 lb 2.4 oz) (07/03 0449) Weight change: -3.107 kg (-6 lb 13.6 oz) Last BM Date: 08/17/16  Intake/Output from previous day: 07/02 0701 - 07/03 0700 In: 483 [P.O.:480; I.V.:3] Out: 2800 [Urine:2800]  PHYSICAL EXAM General appearance: alert, cooperative and no distress Resp: clear to auscultation bilaterally Cardio: regular rate and rhythm, S1, S2 normal, no murmur, click, rub or gallop GI: soft, non-tender; bowel sounds normal; no masses,  no organomegaly Extremities: extremities normal, atraumatic, no cyanosis or edema Skin warm and dry  Lab Results:  Results for orders placed or performed during the hospital encounter of 08/16/16 (from the past 48 hour(s))  Basic metabolic panel     Status: Abnormal   Collection Time: 08/16/16 12:40 PM  Result Value Ref Range   Sodium 142 135 - 145 mmol/L   Potassium 3.9 3.5 - 5.1 mmol/L   Chloride 105 101 - 111 mmol/L   CO2 30 22 - 32 mmol/L   Glucose, Bld 176 (H) 65 - 99 mg/dL   BUN 16 6 - 20 mg/dL   Creatinine, Ser 1.29 (H) 0.61 - 1.24 mg/dL   Calcium 9.2 8.9 - 10.3 mg/dL   GFR calc non Af Amer 56 (L) >60 mL/min   GFR calc Af Amer >60 >60 mL/min    Comment: (NOTE) The eGFR has been calculated using the CKD EPI equation. This calculation has not been validated in all clinical situations. eGFR's persistently <60 mL/min signify possible Chronic Kidney Disease.    Anion gap 7 5 - 15  CBC     Status: Abnormal   Collection Time:  08/16/16 12:40 PM  Result Value Ref Range   WBC 9.9 4.0 - 10.5 K/uL   RBC 5.64 4.22 - 5.81 MIL/uL   Hemoglobin 15.3 13.0 - 17.0 g/dL   HCT 45.6 39.0 - 52.0 %   MCV 80.9 78.0 - 100.0 fL   MCH 27.1 26.0 - 34.0 pg   MCHC 33.6 30.0 - 36.0 g/dL   RDW 15.9 (H) 11.5 - 15.5 %   Platelets 152 150 - 400 K/uL    Comment: PLATELET COUNT CONFIRMED BY SMEAR GIANT PLATELETS SEEN   Troponin I     Status: Abnormal   Collection Time: 08/16/16 12:40 PM  Result Value Ref Range   Troponin I 0.03 (HH) <0.03 ng/mL    Comment: CRITICAL RESULT CALLED TO, READ BACK BY AND VERIFIED WITH: KENDRICK,J. AT 1349 ON 08/16/2016 BY EVA   Brain natriuretic peptide     Status: Abnormal   Collection Time: 08/16/16 12:40 PM  Result Value Ref Range   B Natriuretic Peptide 1,303.0 (H) 0.0 - 100.0 pg/mL  Urinalysis, Routine w reflex microscopic     Status: Abnormal   Collection Time: 08/16/16  8:36 PM  Result Value Ref Range   Color, Urine YELLOW YELLOW   APPearance CLEAR CLEAR   Specific Gravity, Urine 1.013 1.005 - 1.030   pH 5.0 5.0 - 8.0  Glucose, UA NEGATIVE NEGATIVE mg/dL   Hgb urine dipstick NEGATIVE NEGATIVE   Bilirubin Urine NEGATIVE NEGATIVE   Ketones, ur NEGATIVE NEGATIVE mg/dL   Protein, ur >=300 (A) NEGATIVE mg/dL   Nitrite NEGATIVE NEGATIVE   Leukocytes, UA NEGATIVE NEGATIVE   RBC / HPF 0-5 0 - 5 RBC/hpf   WBC, UA 0-5 0 - 5 WBC/hpf   Bacteria, UA NONE SEEN NONE SEEN   Squamous Epithelial / LPF 0-5 (A) NONE SEEN   Mucous PRESENT   Glucose, capillary     Status: Abnormal   Collection Time: 08/16/16  9:28 PM  Result Value Ref Range   Glucose-Capillary 152 (H) 65 - 99 mg/dL   Comment 1 Notify RN    Comment 2 Document in Chart   Troponin I     Status: Abnormal   Collection Time: 08/16/16 10:34 PM  Result Value Ref Range   Troponin I 0.03 (HH) <0.03 ng/mL    Comment: CRITICAL VALUE NOTED.  VALUE IS CONSISTENT WITH PREVIOUSLY REPORTED AND CALLED VALUE.  TSH     Status: None   Collection Time:  08/16/16 10:35 PM  Result Value Ref Range   TSH 1.045 0.350 - 4.500 uIU/mL    Comment: Performed by a 3rd Generation assay with a functional sensitivity of <=0.01 uIU/mL.  Hemoglobin A1c     Status: Abnormal   Collection Time: 08/16/16 10:35 PM  Result Value Ref Range   Hgb A1c MFr Bld 8.0 (H) 4.8 - 5.6 %    Comment: (NOTE)         Pre-diabetes: 5.7 - 6.4         Diabetes: >6.4         Glycemic control for adults with diabetes: <7.0    Mean Plasma Glucose 183 mg/dL    Comment: (NOTE) Performed At: [facility] [place], [place] [id number] [name] MD AS:[id number]   Basic metabolic panel     Status: Abnormal   Collection Time: 08/17/16  5:17 AM  Result Value Ref Range   Sodium 137 135 - 145 mmol/L   Potassium 3.6 3.5 - 5.1 mmol/L   Chloride 101 101 - 111 mmol/L   CO2 25 22 - 32 mmol/L   Glucose, Bld 244 (H) 65 - 99 mg/dL   BUN 18 6 - 20 mg/dL   Creatinine, Ser 1.35 (H) 0.61 - 1.24 mg/dL   Calcium 8.8 (L) 8.9 - 10.3 mg/dL   GFR calc non Af Amer 53 (L) >60 mL/min   GFR calc Af Amer >60 >60 mL/min    Comment: (NOTE) The eGFR has been calculated using the CKD EPI equation. This calculation has not been validated in all clinical situations. eGFR's persistently <60 mL/min signify possible Chronic Kidney Disease.    Anion gap 11 5 - 15  Hepatic function panel     Status: Abnormal   Collection Time: 08/17/16  5:17 AM  Result Value Ref Range   Total Protein 6.6 6.5 - 8.1 g/dL   Albumin 3.4 (L) 3.5 - 5.0 g/dL   AST 20 15 - 41 U/L   ALT 26 17 - 63 U/L   Alkaline Phosphatase 111 38 - 126 U/L   Total Bilirubin 1.2 0.3 - 1.2 mg/dL   Bilirubin, Direct 0.1 0.1 - 0.5 mg/dL   Indirect Bilirubin 1.1 (H) 0.3 - 0.9 mg/dL  Protime-INR     Status: None   Collection Time: 08/17/16  5:17 AM  Result Value Ref Range  Prothrombin Time 14.3 11.4 - 15.2 seconds   INR 1.11   Lipid panel     Status: Abnormal   Collection Time: 08/17/16  5:17 AM  Result Value  Ref Range   Cholesterol 182 0 - 200 mg/dL   Triglycerides 186 (H) <150 mg/dL   HDL 31 (L) >40 mg/dL   Total CHOL/HDL Ratio 5.9 RATIO   VLDL 37 0 - 40 mg/dL   LDL Cholesterol 114 (H) 0 - 99 mg/dL    Comment:        Total Cholesterol/HDL:CHD Risk Coronary Heart Disease Risk Table                     Men   Women  1/2 Average Risk   3.4   3.3  Average Risk       5.0   4.4  2 X Average Risk   9.6   7.1  3 X Average Risk  23.4   11.0        Use the calculated Patient Ratio above and the CHD Risk Table to determine the patient's CHD Risk.        ATP III CLASSIFICATION (LDL):  <100     mg/dL   Optimal  100-129  mg/dL   Near or Above                    Optimal  130-159  mg/dL   Borderline  160-189  mg/dL   High  >190     mg/dL   Very High   CBC     Status: Abnormal   Collection Time: 08/17/16  5:17 AM  Result Value Ref Range   WBC 9.4 4.0 - 10.5 K/uL   RBC 5.75 4.22 - 5.81 MIL/uL   Hemoglobin 15.4 13.0 - 17.0 g/dL   HCT 46.0 39.0 - 52.0 %   MCV 80.0 78.0 - 100.0 fL   MCH 26.8 26.0 - 34.0 pg   MCHC 33.5 30.0 - 36.0 g/dL   RDW 15.8 (H) 11.5 - 15.5 %   Platelets 160 150 - 400 K/uL    Comment: PLATELET COUNT CONFIRMED BY SMEAR SPECIMEN CHECKED FOR CLOTS   Troponin I     Status: Abnormal   Collection Time: 08/17/16  5:17 AM  Result Value Ref Range   Troponin I 0.03 (HH) <0.03 ng/mL    Comment: CRITICAL VALUE NOTED.  VALUE IS CONSISTENT WITH PREVIOUSLY REPORTED AND CALLED VALUE.  Glucose, capillary     Status: Abnormal   Collection Time: 08/17/16  7:28 AM  Result Value Ref Range   Glucose-Capillary 166 (H) 65 - 99 mg/dL  Troponin I     Status: Abnormal   Collection Time: 08/17/16 10:42 AM  Result Value Ref Range   Troponin I 0.03 (HH) <0.03 ng/mL    Comment: CRITICAL VALUE NOTED.  VALUE IS CONSISTENT WITH PREVIOUSLY REPORTED AND CALLED VALUE.  Glucose, capillary     Status: Abnormal   Collection Time: 08/17/16 11:17 AM  Result Value Ref Range   Glucose-Capillary 158 (H)  65 - 99 mg/dL  Glucose, capillary     Status: Abnormal   Collection Time: 08/17/16  4:15 PM  Result Value Ref Range   Glucose-Capillary 167 (H) 65 - 99 mg/dL  Glucose, capillary     Status: Abnormal   Collection Time: 08/17/16  8:40 PM  Result Value Ref Range   Glucose-Capillary 157 (H) 65 - 99 mg/dL  Basic metabolic panel  Status: Abnormal   Collection Time: 08/18/16  6:21 AM  Result Value Ref Range   Sodium 136 135 - 145 mmol/L   Potassium 3.5 3.5 - 5.1 mmol/L   Chloride 100 (L) 101 - 111 mmol/L   CO2 27 22 - 32 mmol/L   Glucose, Bld 158 (H) 65 - 99 mg/dL   BUN 19 6 - 20 mg/dL   Creatinine, Ser 1.43 (H) 0.61 - 1.24 mg/dL   Calcium 8.9 8.9 - 10.3 mg/dL   GFR calc non Af Amer 50 (L) >60 mL/min   GFR calc Af Amer 57 (L) >60 mL/min    Comment: (NOTE) The eGFR has been calculated using the CKD EPI equation. This calculation has not been validated in all clinical situations. eGFR's persistently <60 mL/min signify possible Chronic Kidney Disease.    Anion gap 9 5 - 15  Glucose, capillary     Status: Abnormal   Collection Time: 08/18/16  7:39 AM  Result Value Ref Range   Glucose-Capillary 155 (H) 65 - 99 mg/dL    ABGS No results for input(s): PHART, PO2ART, TCO2, HCO3 in the last 72 hours.  Invalid input(s): PCO2 CULTURES No results found for this or any previous visit (from the past 240 hour(s)). Studies/Results: Dg Chest 2 View  Result Date: 08/16/2016 CLINICAL DATA:  Worsening shortness of breath last night, history hypertension, diabetes mellitus EXAM: CHEST  2 VIEW COMPARISON:  05/28/2016 FINDINGS: Enlargement of cardiac silhouette with pulmonary vascular congestion. Mediastinal contour stable. Diffuse interstitial infiltrates likely pulmonary edema. No segmental consolidation, pleural effusion, or pneumothorax. Bones demineralized. IMPRESSION: Enlargement of cardiac silhouette with pulmonary vascular congestion and probable pulmonary edema. Electronically Signed   By:  Lavonia Dana M.D.   On: 08/16/2016 12:23    Medications:  Prior to Admission:  Prescriptions Prior to Admission  Medication Sig Dispense Refill Last Dose  . AMITIZA 24 MCG capsule Take 1 capsule by mouth 2 (two) times daily.   08/16/2016 at Unknown time  . aspirin EC 81 MG tablet Take 81 mg by mouth daily.   08/16/2016 at Unknown time  . carvedilol (COREG) 3.125 MG tablet Take 1 tablet (3.125 mg total) by mouth 2 (two) times daily. 180 tablet 3 08/16/2016 at 0830  . furosemide (LASIX) 40 MG tablet Alternate 40 mg one day and 20 mg the next 90 tablet 3 08/16/2016 at Unknown time  . OLANZapine (ZYPREXA) 5 MG tablet Take 5 mg by mouth 2 (two) times daily.   08/16/2016 at Unknown time  . pantoprazole (PROTONIX) 40 MG tablet Take 40 mg by mouth daily.   08/16/2016 at Unknown time  . potassium chloride SA (K-DUR,KLOR-CON) 20 MEQ tablet Take 20 mEq by mouth daily.   08/16/2016 at Unknown time  . linagliptin (TRADJENTA) 5 MG TABS tablet Take 5 mg by mouth daily.   unknown  . rosuvastatin (CRESTOR) 10 MG tablet Take 1 tablet (10 mg total) by mouth daily. 90 tablet 3   . sacubitril-valsartan (ENTRESTO) 24-26 MG Take 1 tablet by mouth 2 (two) times daily. 60 tablet 6    Scheduled: . aspirin EC  81 mg Oral Daily  . carvedilol  6.25 mg Oral BID  . docusate sodium  100 mg Oral BID  . enoxaparin (LOVENOX) injection  40 mg Subcutaneous Q24H  . furosemide  40 mg Intravenous Daily  . insulin aspart  0-15 Units Subcutaneous TID WC  . insulin aspart  0-5 Units Subcutaneous QHS  . lubiprostone  24 mcg Oral BID  .  OLANZapine  5 mg Oral BID  . pantoprazole  40 mg Oral Daily  . potassium chloride  20 mEq Oral Daily  . rosuvastatin  20 mg Oral Daily  . sacubitril-valsartan  1 tablet Oral BID  . sodium chloride flush  3 mL Intravenous Q12H   Continuous:  KFE:[id number] **OR** acetaminophen, albuterol, diphenhydrAMINE, hydrALAZINE, ondansetron **OR** ondansetron (ZOFRAN) IV  Assesment: He was admitted with acute on  chronic combined systolic and diastolic heart failure. He is better. His blood pressure was elevated and is much improved now. He's had trouble with renal insufficiency and as he gets diuresed that has been a problem. Principal Problem:   Acute on chronic combined systolic and diastolic CHF (congestive heart failure) (HCC) Active Problems:   Diabetes mellitus with complication, without long-term current use of insulin (HCC)   Essential hypertension   Schizoaffective disorder, bipolar type (HCC)   OSA (obstructive sleep apnea)   Acute renal insufficiency   CAD (coronary artery disease)    Plan: Continue treatments. I think he could potentially be discharged tomorrow    LOS: 2 days   [name] L 08/18/2016, 8:39 AM

## 2016-08-18 NOTE — Progress Notes (Signed)
Notified Education officer, museum of the patient stating that he did not have money for transportation home.  Money was given and I notified [facility]  to set up transportation home.  Patient was notified and prepared for discharge.

## 2016-08-18 NOTE — Consult Note (Signed)
   [facility] CM Inpatient Consult   08/18/2016  [name] 11/25/1950 [id number]   Patient screened for potential [facility] services. Patient is on the [facility] registry as a benefit of their [employer] medicare . Electronic medical record reveals inpatient CM has ordered [facility] referral for EMMI HF transition calls. If additional care management needs are identified through the [facility] Transition HF calls [facility] Management services will assess for further needs.  For questions please contact:   Koah Chisenhall RN, [facility] Liaison  [phone]) Business Mobile [phone]) Toll free office

## 2016-08-18 NOTE — Progress Notes (Signed)
Attempted to [facility] the patients contact on the patients chart.  Spoke with the patient about discharge planning in regards to a ride home and he states that he has no money for a cab and he may have to stay here until 6 pm.  I will notify CM to see if they can assist in any way.

## 2016-08-18 NOTE — Progress Notes (Signed)
Progress Note  Patient Name: [name] Date of Encounter: 08/18/2016  Primary Cardiologist: Bronson Ing    Subjective   Feeling better. Wants to go home.  Inpatient Medications    Scheduled Meds: . aspirin EC  81 mg Oral Daily  . carvedilol  6.25 mg Oral BID  . docusate sodium  100 mg Oral BID  . enoxaparin (LOVENOX) injection  40 mg Subcutaneous Q24H  . furosemide  40 mg Intravenous Daily  . insulin aspart  0-15 Units Subcutaneous TID WC  . insulin aspart  0-5 Units Subcutaneous QHS  . lubiprostone  24 mcg Oral BID  . OLANZapine  5 mg Oral BID  . pantoprazole  40 mg Oral Daily  . potassium chloride  20 mEq Oral Daily  . rosuvastatin  20 mg Oral Daily  . sacubitril-valsartan  1 tablet Oral BID  . sodium chloride flush  3 mL Intravenous Q12H   Continuous Infusions:  PRN Meds: acetaminophen **OR** acetaminophen, albuterol, diphenhydrAMINE, hydrALAZINE, ondansetron **OR** ondansetron (ZOFRAN) IV   Vital Signs    Vitals:   08/17/16 1518 08/17/16 2041 08/18/16 0449 08/18/16 0450  BP:  122/68  116/62  Pulse: 85 88  80  Resp:  18  16  Temp:  98 F (36.7 C)  97.8 F (36.6 C)  TempSrc:  Oral  Oral  SpO2: 94% 95%  100%  Weight:   178 lb 2.4 oz (80.8 kg)   Height:        Intake/Output Summary (Last 24 hours) at 08/18/16 1029 Last data filed at 08/18/16 0900  Gross per 24 hour  Intake              483 ml  Output             1700 ml  Net            -1217 ml   Filed Weights   08/16/16 1735 08/17/16 0611 08/18/16 0449  Weight: 180 lb 6.4 oz (81.8 kg) 179 lb 10.8 oz (81.5 kg) 178 lb 2.4 oz (80.8 kg)    Telemetry    NSR with PVC's - Personally Reviewed  ECG      Physical Exam   GEN: No acute distress.   Neck: No JVD Cardiac: RRR, no murmurs, rubs, or gallops.  Respiratory: Clear to auscultation bilaterally. GI: Soft, nontender, non-distended  MS: No edema; No deformity. Neuro:  Nonfocal  Psych: Normal affect   Labs    Chemistry Recent Labs Lab  08/16/16 1240 08/17/16 0517 08/18/16 0621  NA 142 137 136  K 3.9 3.6 3.5  CL 105 101 100*  CO2 30 25 27   GLUCOSE 176* 244* 158*  BUN 16 18 19   CREATININE 1.29* 1.35* 1.43*  CALCIUM 9.2 8.8* 8.9  PROT  --  6.6  --   ALBUMIN  --  3.4*  --   AST  --  20  --   ALT  --  26  --   ALKPHOS  --  111  --   BILITOT  --  1.2  --   GFRNONAA 56* 53* 50*  GFRAA >60 >60 57*  ANIONGAP 7 11 9      Hematology Recent Labs Lab 08/16/16 1240 08/17/16 0517  WBC 9.9 9.4  RBC 5.64 5.75  HGB 15.3 15.4  HCT 45.6 46.0  MCV 80.9 80.0  MCH 27.1 26.8  MCHC 33.6 33.5  RDW 15.9* 15.8*  PLT 152 160    Cardiac Enzymes Recent Labs Lab  08/16/16 1240 08/16/16 2234 08/17/16 0517 08/17/16 1042  TROPONINI 0.03* 0.03* 0.03* 0.03*   No results for input(s): TROPIPOC in the last 168 hours.   BNP Recent Labs Lab 08/16/16 1240  BNP 1,303.0*     DDimer No results for input(s): DDIMER in the last 168 hours.   Radiology    Dg Chest 2 View  Result Date: 08/16/2016 CLINICAL DATA:  Worsening shortness of breath last night, history hypertension, diabetes mellitus EXAM: CHEST  2 VIEW COMPARISON:  05/28/2016 FINDINGS: Enlargement of cardiac silhouette with pulmonary vascular congestion. Mediastinal contour stable. Diffuse interstitial infiltrates likely pulmonary edema. No segmental consolidation, pleural effusion, or pneumothorax. Bones demineralized. IMPRESSION: Enlargement of cardiac silhouette with pulmonary vascular congestion and probable pulmonary edema. Electronically Signed   By: Lavonia Dana M.D.   On: 08/16/2016 12:23    Cardiac Studies   None this hospitalization. Echoes reviewed yesterday.  Patient Profile     66 y.o. male with severe LV dysfunction and accelerated hypertension admitted with acute on chronic systolic and diastolic heart failure.  Assessment & Plan    1. Acute on chronic systolic and diastolic heart failure with severe left ventricular systolic dysfunction: Has put out  2.3 L on IV Lasix 40 mg daily. I would discharge him on Lasix 40 mg daily. BP now controlled on higher dose of Coreg.  Continue Entresto. Nuclear stress test showed significant myocardial scar. Appreciate nutrition consult. Pt is consuming high amounts of sodium at breakfast.  2. Accelerated hypertension: BP now controlled on higher dose of Coreg. Continue Entresto at present dose for now.  3. OSA: Noncompliant with CPAP. This is worsening hypertension.  4. CAD with prior IZ:[id number] stable. Nuclear stress test results previously reviewed, with no evidence of ischemia. Continue ASA and Coreg. LDL is elevated to 114 so I will continue Crestor 20 mg.  5. Cardiomyopathy:I will repeat an echocardiogram in September 2018 to assess for LVEF improvement. If it remains severely reduced, I will refer to EP for AICD.  6. Hyperlipidemia: LDL is elevated to 114 so I  increased Crestor to 20 mg.   Dispo: Able to be discharged from my standpoint. I have already made a follow up appt in our office.   Signed, Kate Sable, MD  08/18/2016, 10:29 AM

## 2016-08-18 NOTE — Progress Notes (Signed)
Spoke with [name] with [facility] because th [facility] transporter/dispacthcer stated that they would not be able to transport him home because [place] to [place]. Melissa states that she is not sure if there is a Lucianne Lei available since they are short on staff due to the holiday. My name and number was given to her and she states she will call me back with confirmation.

## 2016-08-27 NOTE — Discharge Summary (Signed)
Physician Discharge Summary  Patient ID: LUVERN MCISAAC MRN: [id number] DOB/AGE: 08/23/1950 66 y.o. Primary Care Physician:Esparanza Krider, Percell Miller, MD Admit date: 08/16/2016 Discharge date: 08/27/2016    Discharge Diagnoses:   Principal Problem:   Acute on chronic combined systolic and diastolic CHF (congestive heart failure) (HCC) Active Problems:   Diabetes mellitus with complication, without long-term current use of insulin (HCC)   Essential hypertension   Schizoaffective disorder, bipolar type (HCC)   OSA (obstructive sleep apnea)   Acute renal insufficiency   CAD (coronary artery disease)   Acute renal failure superimposed on stage 3 chronic kidney disease (HCC)   Allergies as of 08/18/2016      Reactions   Codeine Nausea And Vomiting   Morphine And Related Nausea And Vomiting      Medication List    TAKE these medications   AMITIZA 24 MCG capsule Generic drug:  lubiprostone Take 1 capsule by mouth 2 (two) times daily.   aspirin EC 81 MG tablet Take 81 mg by mouth daily.   carvedilol 6.25 MG tablet Commonly known as:  COREG Take 1 tablet (6.25 mg total) by mouth 2 (two) times daily. You can take 2 twice a day of the ones you have until you run out. What changed:  medication strength  how much to take  additional instructions   furosemide 40 MG tablet Commonly known as:  LASIX Alternate 40 mg one day and 20 mg the next   linagliptin 5 MG Tabs tablet Commonly known as:  TRADJENTA Take 5 mg by mouth daily.   OLANZapine 5 MG tablet Commonly known as:  ZYPREXA Take 5 mg by mouth 2 (two) times daily.   pantoprazole 40 MG tablet Commonly known as:  PROTONIX Take 40 mg by mouth daily.   potassium chloride SA 20 MEQ tablet Commonly known as:  K-DUR,KLOR-CON Take 20 mEq by mouth daily.   rosuvastatin 10 MG tablet Commonly known as:  CRESTOR Take 1 tablet (10 mg total) by mouth daily.   sacubitril-valsartan 24-26 MG Commonly known as:  ENTRESTO Take 1 tablet  by mouth 2 (two) times daily.       Discharged Condition:Improved    Consults: Cardiology  Significant Diagnostic Studies: Dg Chest 2 View  Result Date: 08/16/2016 CLINICAL DATA:  Worsening shortness of breath last night, history hypertension, diabetes mellitus EXAM: CHEST  2 VIEW COMPARISON:  05/28/2016 FINDINGS: Enlargement of cardiac silhouette with pulmonary vascular congestion. Mediastinal contour stable. Diffuse interstitial infiltrates likely pulmonary edema. No segmental consolidation, pleural effusion, or pneumothorax. Bones demineralized. IMPRESSION: Enlargement of cardiac silhouette with pulmonary vascular congestion and probable pulmonary edema. Electronically Signed   By: Lavonia Dana M.D.   On: 08/16/2016 12:23    Lab Results: Basic Metabolic Panel: No results for input(s): NA, K, CL, CO2, GLUCOSE, BUN, CREATININE, CALCIUM, MG, PHOS in the last 72 hours. Liver Function Tests: No results for input(s): AST, ALT, ALKPHOS, BILITOT, PROT, ALBUMIN in the last 72 hours.   CBC: No results for input(s): WBC, NEUTROABS, HGB, HCT, MCV, PLT in the last 72 hours.  No results found for this or any previous visit (from the past 240 hour(s)).   Hospital Course: This is a patient who's had a recent diagnosis of heart failure and who came to the emergency department with increasing shortness of breath and swelling of his legs. History with IV diuresis and improved rapidly. He did not have any evidence of acute coronary syndrome. Cardiology consultation was obtained. He was much better about  48 hours into his hospitalization and it was felt that he could be discharged home. He did not have any more edema his breathing was back to baseline.  Discharge Exam: Blood pressure 116/62, pulse 80, temperature 97.8 F (36.6 C), temperature source Oral, resp. rate 16, height 5\' 4"  (1.626 m), weight 80.8 kg (178 lb 2.4 oz), SpO2 100 %. He's awake and alert. Chest is clear. Heart is regular with no  gallop. His abdomen is soft with no masses. Extremities showed no edema.  Disposition: Home  Discharge Instructions    (HEART FAILURE PATIENTS) Call MD:  Anytime you have any of the following symptoms: 1) 3 pound weight gain in 24 hours or 5 pounds in 1 week 2) shortness of breath, with or without a dry hacking cough 3) swelling in the hands, feet or stomach 4) if you have to sleep on extra pillows at night in order to breathe.    Complete by:  As directed    Diet - low sodium heart healthy    Complete by:  As directed    Increase activity slowly    Complete by:  As directed       Follow-up Information    Lendon Colonel, NP Follow up on 09/01/2016.   Specialties:  Nurse Practitioner, Radiology, Cardiology Why:  2 pm. Please bring ALL of your MEDICATIONS with you to the appointment! Contact information: [address] [place] [address] [phone]           Signed: Alonza Bogus   08/27/2016, 8:31 AM

## 2016-08-28 NOTE — Progress Notes (Signed)
Cardiology Office Note   Date:  09/01/2016   ID:  [name], DOB 1950-05-03, MRN [id number]  PCP:  Sinda Du, MD  Cardiologist:  Bronson Ing Chief Complaint  Patient presents with  . Hospitalization Follow-up  . Congestive Heart Failure    History of Present Illness: [name] is a 66 y.o. male who presents for ongoing assessment and post hospitalization for admission in the setting of acute on chronic combined systolic and diastolic heart failure, with other history to include hypertension, diabetes mellitus, coronary artery disease, history of schizoaffective disorder, and sleep apnea with renal insufficiency.  The patient was seen on consultation in the setting of decompensated heart failure given IV diuresis with rapid improvement. The patient was discharged home with follow-up appointment to be seen closely after discharge. Weight on discharge on 178 pounds.  He comes today without complaints. He is medically compliant, weighs daily, and is doing his best to avoid salt. He has not complaints of racing heart rate, dizziness, or chest pain. He does not check his blood sugar at home.    Past Medical History:  Diagnosis Date  . Arthritis   . Bipolar disorder ([place])   . CAD (coronary artery disease) 08/16/2016  . Cellulitis and abscess of foot 03/05/2016  . Chronic combined systolic and diastolic heart failure ([place])   . Diabetes mellitus without complication ([place])    boderline  . Heart murmur 1995  . Hypertension   . Kidney stones   . Myocardial infarction ([place])   . OSA (obstructive sleep apnea)   . Schizoaffective disorder, bipolar type ([place]) 04/25/2016    Past Surgical History:  Procedure Laterality Date  . APPENDECTOMY    . CHOLECYSTECTOMY N/A 10/20/2013   Procedure: LAPAROSCOPIC CHOLECYSTECTOMY;  Surgeon: [name], MD;  Location: [facility];  Service: General;  Laterality: N/A;  . HIP SURGERY    . KNEE SURGERY       Current Outpatient Prescriptions    Medication Sig Dispense Refill  . AMITIZA 24 MCG capsule Take 1 capsule by mouth 2 (two) times daily.    Marland Kitchen aspirin EC 81 MG tablet Take 81 mg by mouth daily.    . carvedilol (COREG) 6.25 MG tablet Take 1 tablet (6.25 mg total) by mouth 2 (two) times daily. You can take 2 twice a day of the ones you have until you run out. 60 tablet 12  . furosemide (LASIX) 40 MG tablet Alternate 40 mg one day and 20 mg the next 90 tablet 3  . linagliptin (TRADJENTA) 5 MG TABS tablet Take 5 mg by mouth daily.    Marland Kitchen OLANZapine (ZYPREXA) 5 MG tablet Take 5 mg by mouth 2 (two) times daily.    Marland Kitchen omeprazole (PRILOSEC) 20 MG capsule     . potassium chloride SA (K-DUR,KLOR-CON) 20 MEQ tablet Take 20 mEq by mouth daily.    . rosuvastatin (CRESTOR) 10 MG tablet Take 1 tablet (10 mg total) by mouth daily. 90 tablet 3  . sacubitril-valsartan (ENTRESTO) 24-26 MG Take 1 tablet by mouth 2 (two) times daily. 60 tablet 6   No current facility-administered medications for this visit.     Allergies:   Codeine and Morphine and related    Social History:  The patient  reports that he has never smoked. He has never used smokeless tobacco. He reports that he does not drink alcohol or use drugs.   Family History:  The patient's family history includes Dementia in his mother; Hypertension in his  mother.    ROS: All other systems are reviewed and negative. Unless otherwise mentioned in H&P    PHYSICAL EXAM: VS:  BP 118/62   Pulse 73   Ht 5\' 4"  (1.626 m)   Wt 182 lb (82.6 kg)   SpO2 98%   BMI 31.24 kg/m  , BMI Body mass index is 31.24 kg/m. GEN: Well nourished, well developed, in no acute distress  HEENT: normal  Neck: no JVD, carotid bruits, or masses Cardiac: RRR; 2/6 systolic murmurs, rubs, or gallops, 1+ pitting dependent  edema  Respiratory:  clear to auscultation bilaterally, normal work of breathing GI: soft, nontender, nondistended, + BS MS: no deformity or atrophy  Skin: warm and dry, no rash Neuro:   Strength and sensation are intact Psych: euthymic mood, full affect  Recent Labs: 08/16/2016: B Natriuretic Peptide 1,303.0; TSH 1.045 08/17/2016: ALT 26; Hemoglobin 15.4; Platelets 160 08/18/2016: BUN 19; Creatinine, Ser 1.43; Potassium 3.5; Sodium 136    Lipid Panel    Component Value Date/Time   CHOL 182 08/17/2016 0517   TRIG 186 (H) 08/17/2016 0517   HDL 31 (L) 08/17/2016 0517   CHOLHDL 5.9 08/17/2016 0517   VLDL 37 08/17/2016 0517   LDLCALC 114 (H) 08/17/2016 0517      Wt Readings from Last 3 Encounters:  09/01/16 182 lb (82.6 kg)  08/18/16 178 lb 2.4 oz (80.8 kg)  07/16/16 183 lb (83 kg)      Other studies Reviewed: Nuclear stress test 07/08/16:  Defect 1: There is a large defect of moderate severity present in the basal anterior, basal inferoseptal, basal inferior, basal inferolateral, mid anterior, mid inferoseptal, mid inferior, mid inferolateral, apical anterior, apical septal and apical inferior location.  Findings consistent with prior myocardial infarction. No ischemia.  This is a high risk study.  Nuclear stress EF: 33%. Echocardiogram 06/08/2016 Left ventricle: The cavity size was normal. Wall thickness was   increased increased in a pattern of mild to moderate LVH.   Systolic function was severely reduced. The estimated ejection   fraction was in the range of 20% to 25%. Diffuse hypokinesis.   Features are consistent with a pseudonormal left ventricular   filling pattern, with concomitant abnormal relaxation and   increased filling pressure (grade 2 diastolic dysfunction). - Aortic valve: Mildly to moderately calcified annulus. A bicuspid   morphology cannot be excluded; mildly calcified leaflets. There   was mild stenosis. There was mild regurgitation. - Mitral valve: There was mild regurgitation. - Left atrium: The atrium was mildly dilated. - Right ventricle: Systolic function was mildly reduced. - Right atrium: The atrium was mildly dilated. Central  venous   pressure (est): 3 mm Hg. - Atrial septum: No defect or patent foramen ovale was identified. - Tricuspid valve: There was trivial regurgitation. - Pulmonary arteries: PA peak pressure: 30 mm Hg (S). - Pericardium, extracardiac: There was no pericardial effusion.  ASSESSMENT AND PLAN:  1. Chronic Mixed CHF: No evidence of significant decompensation. He weighs daily,and is medically complaint. Weight is not significantly changed from discharge weight. I will check a BMET, and CBC.   2. Ischemic CM: Most recent echocardiogram demonstrated reduced EF of 25%. Will optimize medical therapy by increasing coreg to 12.5 mg BID. Continue Entresto. He will have repeat echocardiogram in Sept 2018. Consideration for ICD if EF remains low.   3. Hx of Medical non-compliance: Will refer him to [facility] for evaluation of need for further assistance at home. He has mental health issues, and  has a power of attorney that assists him with legal issues. He has Bipolar disorder and schizoaffective disorder. Will need close home follow up in this setting concerning his systolic dysfunction and weight maintenance, with medication adjustments.   4. Diabetes: Followed by PCP Needs to check BG at home.    Current medicines are reviewed at length with the patient today.    Labs/ tests ordered today include:  Phill Myron. West Pugh, ANP, AACC   09/01/2016 2:36 PM    [facility] [address] [place], [place] [address] Phone: [phone]; Fax: [phone]

## 2016-09-01 ENCOUNTER — Other Ambulatory Visit (HOSPITAL_COMMUNITY)
Admission: RE | Admit: 2016-09-01 | Discharge: 2016-09-01 | Disposition: A | Discharge status: Home / Self Care | Payer: Medicare Other | Source: Ambulatory Visit | Attending: Adult Health | Admitting: Adult Health

## 2016-09-01 ENCOUNTER — Ambulatory Visit (INDEPENDENT_AMBULATORY_CARE_PROVIDER_SITE_OTHER): Payer: Medicare Other | Admitting: Adult Health

## 2016-09-01 ENCOUNTER — Encounter: Payer: Self-pay | Admitting: Adult Health

## 2016-09-01 VITALS — BP 118/62 | HR 73 | Ht 64.0 in | Wt 182.0 lb

## 2016-09-01 DIAGNOSIS — I5033 Acute on chronic diastolic (congestive) heart failure: Secondary | ICD-10-CM

## 2016-09-01 DIAGNOSIS — F319 Bipolar disorder, unspecified: Secondary | ICD-10-CM

## 2016-09-01 DIAGNOSIS — I5042 Chronic combined systolic (congestive) and diastolic (congestive) heart failure: Secondary | ICD-10-CM | POA: Diagnosis not present

## 2016-09-01 DIAGNOSIS — I429 Cardiomyopathy, unspecified: Secondary | ICD-10-CM | POA: Diagnosis not present

## 2016-09-01 DIAGNOSIS — I25118 Atherosclerotic heart disease of native coronary artery with other forms of angina pectoris: Secondary | ICD-10-CM

## 2016-09-01 LAB — BASIC METABOLIC PANEL
Anion gap: 9 (ref 5–15)
BUN: 14 mg/dL (ref 6–20)
CO2: 30 mmol/L (ref 22–32)
Calcium: 8.6 mg/dL — ABNORMAL LOW (ref 8.9–10.3)
Chloride: 97 mmol/L — ABNORMAL LOW (ref 101–111)
Creatinine, Ser: 1.32 mg/dL — ABNORMAL HIGH (ref 0.61–1.24)
GFR calc Af Amer: >60 mL/min (ref >60)
GFR, EST NON AFRICAN AMERICAN: 55 mL/min — AB (ref >60)
GLUCOSE: 134 mg/dL — AB (ref 65–99)
Potassium: 3.7 mmol/L (ref 3.5–5.1)
Sodium: 136 mmol/L (ref 135–145)

## 2016-09-01 MED ORDER — CARVEDILOL 12.5 MG PO TABS: 12.5000 mg | ORAL_TABLET | Freq: Two times a day (BID) | ORAL | 3 refills | Status: DC

## 2016-09-01 NOTE — Progress Notes (Signed)
This encounter was created in error - please disregard.

## 2016-09-01 NOTE — Addendum Note (Signed)
Addended byCalla Kicks T on: 09/01/2016 04:23 PM   Modules accepted: Level of Service, SmartSet

## 2016-09-01 NOTE — Patient Instructions (Addendum)
Your physician recommends that you schedule a follow-up appointment RV:IFBP.  Your physician has recommended you make the following change in your medication: Increase Coreg to 12.5 mg Two Times Daily   Your physician recommends that you return for lab work in: Today  Your physician has requested that you have an echocardiogram. Echocardiography is a painless test that uses sound waves to create images of your heart. It provides your doctor with information about the size and shape of your heart and how well your heart's chambers and valves are working. This procedure takes approximately one hour. There are no restrictions for this procedure.  If you need a refill on your cardiac medications before your next appointment, please call your pharmacy.  Thank you for choosing [facility]!

## 2016-09-02 ENCOUNTER — Other Ambulatory Visit: Payer: Self-pay | Admitting: *Deleted

## 2016-09-02 NOTE — Patient Outreach (Signed)
[facility]) Care Management  09/02/2016  JAVIEN TESCH 02/08/1951 [id number]  Mr. DERRIL FRANEK is a 66 year old gentleman who lives in [place]. He was hospitalized from 08/16/16 to 08/27/16 for treatment of acute on chronic combined systolic and diastolic heart failure. He also has a medical history which includes hypertension, diabetes mellitus type II, coronary artery disease, schizoaffective disorder, renal insufficiency, and sleep apnea.   Mr. Curro was referred to [facility] for care coordination and assistance with chronic disease management by his cardiology provider after an office visit yesterday. Mr. Gascoigne suffers from bipolar disorder and schizoaffective disorder and is assisted by a (durable) power of attorney [name] [phone]). I spoke with Mrs. Toms who agreed to discuss with Mr. Otting my visiting next week. We scheduled an appointment for Monday.    Plan: I will see MR. Jana Hakim and his durable POA Mrs. [name] at the home of Mr. Loeber next week on Monday, first thing in the morning prior to Mrs. Toms' departure for her job.    [facility]  [phone]

## 2016-09-07 ENCOUNTER — Other Ambulatory Visit: Payer: Self-pay | Admitting: *Deleted

## 2016-09-07 NOTE — Patient Outreach (Signed)
[name]   09/07/2016  [name] 03-18-1950 [id number]  [name] Patterson an 66 y.o. gentleman who lives in [place], [place]. He was hospitalized from 08/16/16 to 08/27/16 for treatment of acute on chronic combined systolic and diastolic heart failure. He also has a medical history which includes hypertension, diabetes mellitus type II, coronary artery disease, schizoaffective disorder bipolar type, renal insufficiency, and sleep apnea.   [name] was referred to [place] management for care coordination and assistance with chronic disease management by his cardiology provider. I am seeing [name] at his home today with his (durable) power of attorney [name] [phone]) present.    Subjective: "I just don't want to have another heart attack again but I hear I'm going to be prone to it."  Objective:  Wt 177 lb 4 oz (80.4 kg)   BMI 30.42 kg/m    Review of Systems  Constitutional: Negative.   HENT: Negative.   Eyes: Negative.   Respiratory: Negative.  Negative for cough, sputum production, shortness of breath and wheezing.   Cardiovascular: Negative.  Negative for chest pain, palpitations, orthopnea and leg swelling.  Gastrointestinal: Positive for diarrhea. Negative for nausea and vomiting.       Reports mild/intermittent loose stools since taking Amitiza; reports heartburn if he eats a big meal or eats too quickly  Genitourinary: Negative.   Musculoskeletal: Negative.  Negative for falls.  Skin: Negative.   Neurological: Negative.   Psychiatric/Behavioral: Negative.     Physical Exam  Constitutional: Vital signs are normal. He appears well-developed and well-nourished. He Patterson active. He does not have a sickly appearance. He does not appear ill.  Cardiovascular: Normal rate and regular rhythm.   Murmur heard.  Systolic murmur Patterson present with a grade of 4/6  Respiratory: Effort normal and breath sounds normal. He has no wheezes. He has  no rhonchi. He has no rales.  GI: Soft. Bowel sounds are normal. He exhibits no distension.  Neurological: He Patterson alert.  Skin: Skin Patterson warm, dry and intact.  Psychiatric: His speech Patterson normal. Cognition and memory are normal.    Encounter Medications:   Outpatient Encounter Prescriptions as of 09/07/2016  Medication Sig Note  . AMITIZA 24 MCG capsule Take 1 capsule by mouth 2 (two) times daily.   Marland Kitchen aspirin EC 81 MG tablet Take 81 mg by mouth daily.   . furosemide (LASIX) 40 MG tablet Alternate 40 mg one day and 20 mg the next   . linagliptin (TRADJENTA) 5 MG TABS tablet Take 5 mg by mouth daily.   Marland Kitchen OLANZapine (ZYPREXA) 5 MG tablet Take 5 mg by mouth 2 (two) times daily.   Marland Kitchen omeprazole (PRILOSEC) 20 MG capsule    . potassium chloride SA (K-DUR,KLOR-CON) 20 MEQ tablet Take 20 mEq by mouth daily.   . sacubitril-valsartan (ENTRESTO) 24-26 MG Take 1 tablet by mouth 2 (two) times daily.   . carvedilol (COREG) 12.5 MG tablet Take 1 tablet (12.5 mg total) by mouth 2 (two) times daily. (Patient not taking: Reported on 09/07/2016) 09/07/2016: POA reports medication was filled and brought home to patient; cannot find; POA will help patient find today; likely not taking x ~ 1 week  . rosuvastatin (CRESTOR) 10 MG tablet Take 1 tablet (10 mg total) by mouth daily. (Patient not taking: Reported on 09/07/2016) 09/07/2016: Not taking;none on hand; POA states she was not aware patient was to be on this medication; patient did not know about  this medication   No facility-administered encounter medications on file as of 09/07/2016.     Functional Status:   In your present state of health, do you have any difficulty performing the following activities: 09/07/2016 08/16/2016  Hearing? N N  Vision? N N  Difficulty concentrating or making decisions? N N  Walking or climbing stairs? N N  Dressing or bathing? N N  Doing errands, shopping? N N  Preparing Food and eating ? N -  Using the Toilet? N -  In the past six  months, have you accidently leaked urine? N -  Do you have problems with loss of bowel control? N -  Managing your Medications? Y -  Managing your Finances? Y -  Housekeeping or managing your Housekeeping? Y -  Some recent data might be hidden    Fall/Depression Screening:    Fall Risk  09/07/2016  Falls in the past year? No   PHQ 2/9 Scores 09/07/2016  PHQ - 2 Score 0    Assessment: 66 year old gentleman with schizophrenia/bipolar disorder living alone in [place]. He has lived all his life with his mother, now 87 with dementia and residing in a skilled nursing facility, who managed every aspect of his day to day life. He had an MI earlier this year and was recently hospitalized with a CHF exacerbation. [name] has joint POA's who are actively involved in his care especially since his mother was placed in a nursing facility.  Medication Management concerns - [name]'s medications were reviewed in person today; he Patterson taking all medications as prescribed except for Crestor which he does not have on hand and Carvedilol which his POA says was filled and brought to the home but cannot be found today. [name] medications are kept in a brown paper bag and he has been taking them from the bottles. He had 2 bottles of Zyprexa and said he wasn't sure if he hadn't been taking the medication daily from both bottles. He also reports having soft stools and occasional diarrhea since starting amitiza.    I will reach out to [name] DNP re: Crestor.   [name] will help him find his Carvedilol.   I gave [name] a pill box and filled it with his POA, advising that he have help filling his pill box each Saturday in preparation for the following week.   I will discuss side possible side effects of Amitiza with [name].   Knowledge Deficits related to self health management of CHF - [name] weighing himself daily and recording but he didn't know why or what to do with  the measurements. I discussed CHF signs and symptoms and how to use daily weights as a determinant measure for contacting his provider. [name] also eating out 2 meals daily ("a huge" breakfast and lunch) and said he has stopped eating supper because he thought it would keep him from gaining fluid. Instead of an evening meal, he eats a 100 calorie snack pack of Cheez-It's so he can take his medications.    I reviewed prescribed heart healthy, low sodium diet with Mr. Deeley and his POA, advising that he eat 3 meals daily, not overloading on calories/portions at any one meal and emphasizing vegetables throughout the day in addition to avoiding added salt and fried/fatty foods  Home Management Concerns - since Mr. Rampy's mother has declined in health and he Patterson now living alone, his home environment has become  cluttered and unsanitary; his POA Patterson helping as she can but Mr. Sherrer says he feels ill equipped to do things like clean his bathroom and kitchen. He does have funds to pay for limited in home help. With his permission and that of his POA, I have reached out to [facility] to ask them to contact his [place] to arrrange a time to meet to discuss options for in home care/assistance for home management and light housekeeping.   Plan: I will see Mr. Mchargue at home again in 3 weeks to follow up on his progress.   THN CM Care Plan Problem One     Most Recent Value  Care Plan Problem One  Medication Managmeent Concerns  Role Documenting the Problem One  Care Management Coordinator  Care Plan for Problem One  Active  THN Long Term Goal   Over the next 31 days, patient will take all medications as prescribed  THN Long Term Goal Start Date  09/07/16  Interventions for Problem One Long Term Goal  Medications reviewed with patient and POA in person including purpose and prescribing instructions  THN CM Short Term Goal #1   Over the next 30 days, patient will take ALL prescribed medications  (including Carvedilol and Crestor)  THN CM Short Term Goal #1 Start Date  09/07/16  Interventions for Short Term Goal #1  reviewed medications,  advised patient/POA to find filled Carvedilol prescription,  pill box provided / filled,  inquired with MD re: Crestor  THN CM Short Term Goal #2   Over the next 30 days, patient/POA will demonstrate appropriate use of pill box  THN CM Short Term Goal #2 Start Date  09/07/16  Interventions for Short Term Goal #2  Utilizing demonstration and teachback method, showed patient/POA how to appropriately use weekly pill box    THN CM Care Plan Problem Two     Most Recent Value  Care Plan Problem Two  Knowledge Deficits related to CHF self health management  Role Documenting the Problem Two  Care Management Coordinator  Care Plan for Problem Two  Active  Interventions for Problem Two Long Term Goal   Discussed in detail with patient/POA need for self health management plan for long term management of CHF  THN Long Term Goal  Over the next 60 days, patient and POA will demonstrate and verbalize understanding of plan of care for self heatlh management of CHF  THN Long Term Goal Start Date  09/07/16  [facility] CM Short Term Goal #1   Over the next 30 days, patient will weigh himself daily and record, calling provider for weight gain of 3# overnight or 5# in a week  THN CM Short Term Goal #1 Start Date  09/07/16  Interventions for Short Term Goal #2   Utilizing teachback method and demonstration, discussed need for daily weight and recording, identifying when to call provider for weight gain  THN CM Short Term Goal #2   Over the next 30 days, patient/POA will verbalize understanding of signs and symptoms of CHF  THN CM Short Term Goal #2 Start Date  09/07/16  Interventions for Short Term Goal #2  utilizing teachback method, Emmi educational tools, and stop light tool, discussed signs and symptoms of CHF with patient/POA and when to call provider for symptoms  THN CM  Short Term Goal #3   Over the next 30 days, patient will modify sodium intake by not adding salt to foods and avoiding fried foods when he  eats out  [facility] CM Short Term Goal #3 Start Date  09/07/16  Interventions for Short Term Goal #3  Utilizing teachback method and Emmi educational materials, reviewed low sodium heart healthy diet with patient/POA      [place]  [phone]

## 2016-09-08 ENCOUNTER — Other Ambulatory Visit: Payer: Self-pay | Admitting: *Deleted

## 2016-09-08 ENCOUNTER — Telehealth: Payer: Self-pay

## 2016-09-08 MED ORDER — ROSUVASTATIN CALCIUM 10 MG PO TABS: 10.0000 mg | ORAL_TABLET | Freq: Every day | ORAL | 3 refills | Status: DC

## 2016-09-08 NOTE — Patient Outreach (Signed)
[facility]) Care Management  09/08/2016  [name] 11-04-1950 [id number]  Return call and order received from Dr. Sinda Patterson to decrease Amitiza dose to every other day. Return message and order faxed to [place] by Zachary Sims DNP for Crestor 10mg  to be taken daily qhs.   I contacted [name], [place] for [name] and notified her of information as outlined above. [name] will pick up Crestor on the way home from today and deliver to [name] providing instruction to him on how to take both medications.   Plan: I will see [name] at home in 3 weeks as scheduled. [name] Patterson his POA [name] [name] will call the provider office with any new symptoms Patterson will call me if unable to easily reach providers.    [facility]  [phone]

## 2016-09-08 NOTE — Telephone Encounter (Signed)
Sent in rx For Crestor 10 mg daily per staff message per [name], [facility]

## 2016-09-08 NOTE — Telephone Encounter (Signed)
-----   Message from Lendon Colonel, NP sent at 09/07/2016 11:32 AM EDT ----- Regarding: Rx Please send Rx for Crestor 10 mg at [facility] for this patient. Was seen by [facility] and did not this Rx at home.   Thank you!

## 2016-09-09 ENCOUNTER — Other Ambulatory Visit: Payer: Self-pay | Admitting: *Deleted

## 2016-09-09 NOTE — Patient Outreach (Signed)
[facility]) Care Management  09/09/2016  MACDONALD RIGOR 11/23/1950 [id number]  Call received from [place] resources coordinator today indicating that Mr. Bhagat will need to be referred to [facility] for in home care services. I reached out to Louann Sjogren, Water engineer @ [phone] to refer Mr. Kempfer for evaluation for in home care services.   Plan: I will follow up with Mr. Sivertsen and his POA next week to ensure that they have heard from the senior services department re: the referral.    [facility]  [phone]

## 2016-09-29 ENCOUNTER — Other Ambulatory Visit: Payer: Self-pay | Admitting: *Deleted

## 2016-09-29 NOTE — Patient Outreach (Signed)
[facility]) Care Management   09/29/2016  [name] 08-Jul-1950 [id number]   [name] is an 66 y.o.  gentleman who lives in [place], [place]. He was hospitalized from 08/16/16 to 08/27/16 for treatment of acute on chronic combined systolic and diastolic heart failure. He also has a medical history which includes hypertension, diabetes mellitus type II, coronary artery disease, schizoaffective disorder bipolar type, renal insufficiency, and sleep apnea.   [name] was referred to [place] management for care coordination and assistance with chronic disease management by his cardiology provider. I am seeing [name] at his home today with his (durable) power of attorney [name] [phone]) present.     Subjective: "I'm doing ok as long as my stomach stays ok. I had an accident last week."  Objective:  BP 114/76   Pulse 77   Wt 180 lb 2 oz (81.7 kg)   SpO2 98%   BMI 30.92 kg/m   Review of Systems  Constitutional: Negative.   HENT: Negative.   Eyes: Negative.   Respiratory: Negative.  Negative for cough, sputum production, shortness of breath and wheezing.   Cardiovascular: Negative for chest pain and palpitations.       Trace edema vs soft tissue swelling right ankle/foot  Gastrointestinal: Positive for diarrhea.       Diarrhea with at least one episode of loss of bowel control  Genitourinary: Positive for frequency.  Musculoskeletal: Negative.  Negative for falls.  Skin: Negative.   Neurological: Negative.   Psychiatric/Behavioral: Negative.     Physical Exam  Constitutional: He is oriented to person, place, and time. Vital signs are normal. He appears well-developed and well-nourished. He is active. He does not have a sickly appearance. He does not appear ill.  Cardiovascular: Normal rate and regular rhythm.   Murmur heard.  Systolic murmur is present with a grade of 4/6  Respiratory: Effort normal and breath sounds normal. He has no wheezes.  He has no rhonchi. He has no rales.  GI: Soft. Bowel sounds are normal. He exhibits no distension. There is no tenderness.  Musculoskeletal:       Back:  Neurological: He is alert and oriented to person, place, and time.  Skin: Skin is warm, dry and intact.  Psychiatric: He has a normal mood and affect. His speech is normal and behavior is normal. Judgment and thought content normal. Cognition and memory are normal.    Encounter Medications:   Outpatient Encounter Prescriptions as of 09/29/2016  Medication Sig Note  . AMITIZA 24 MCG capsule Take 1 capsule by mouth See admin instructions.    Marland Kitchen aspirin EC 81 MG tablet Take 81 mg by mouth daily.   . carvedilol (COREG) 12.5 MG tablet Take 1 tablet (12.5 mg total) by mouth 2 (two) times daily. (Patient not taking: Reported on 09/07/2016) 09/07/2016: POA reports medication was filled and brought home to patient; cannot find; POA will help patient find today; likely not taking x ~ 1 week  . furosemide (LASIX) 40 MG tablet Alternate 40 mg one day and 20 mg the next   . linagliptin (TRADJENTA) 5 MG TABS tablet Take 5 mg by mouth daily.   Marland Kitchen OLANZapine (ZYPREXA) 5 MG tablet Take 5 mg by mouth 2 (two) times daily.   Marland Kitchen omeprazole (PRILOSEC) 20 MG capsule    . potassium chloride SA (K-DUR,KLOR-CON) 20 MEQ tablet Take 20 mEq by mouth daily.   . rosuvastatin (CRESTOR) 10 MG tablet Take 1 tablet (  10 mg total) by mouth daily. (Patient not taking: Reported on 09/07/2016) 09/08/2016: Notified POA that Rx was faxed to [facility] and patient is to take 1 tablet daily qhs; POA will pick up from drug store and deliver to patient today 09/08/16  . rosuvastatin (CRESTOR) 10 MG tablet Take 1 tablet (10 mg total) by mouth daily.   . sacubitril-valsartan (ENTRESTO) 24-26 MG Take 1 tablet by mouth 2 (two) times daily.    Assessment:  66 year old gentleman with schizophrenia/bipolar disorder living alone in [place]. He has lived all his life with his mother, now 20  with dementia and residing in a skilled nursing facility, who managed every aspect of his day to day life. He had an MI earlier this year and was hospitalized in July with a CHF exacerbation. [name] has joint POA's who are actively involved in his care especially since his mother was placed in a nursing facility.  Medication Management concerns - [name]'s medications were reviewed in person today; he is taking all medications as prescribed including Crestor and Carvedilol and Zyprexa. [name] medications WERE previously kept in a brown paper bag and he was taking his medications from the bottles. However, I left a pill box with him and his POA has been filling it weekly in an effort to decrease confusion about medications and to increase adherence.   When I saw [name] at home last month he reported having soft stools and occasional diarrhea since starting amitiza. I reached out to Dr. Luan Pulling who advised that [name] could take the Amitiza every other day. He has been taking it as prescribed with the change to every other day. However, he is still having diarrhea and last week had a loose stool while at the mall and was unable to make it to the bathroom in time.   I reached out to Dr. Luan Pulling to notify him of [name]'s continued GI disturbance.   Acute/Chronic Health Condition (reflux) - [name] reports "real bad acid that comes up in my throat and bad pain anytime I eat". He says that lately, every time he eats he has significant epigastric pain.    Knowledge Deficits related to self health management of CHF - [name] IS weighing himself daily and recording. His weight was 177# when I saw him in late July, dropped as low as 174, and today was 180lb.    I discussed CHF signs and symptoms and how to use daily weights as a determinant measure for contacting his provider.I advised that he call his provider or me if his weight exceeds 182lb or if he has shortness of breath,  swelling/edema, chest pain, or other new or worsened symptom.     I reviewed prescribed heart healthy, low sodium diet with Mr. Whitehurst, advising that he eat 3 meals daily, not overloading on calories/portions at any one meal and emphasizing vegetables throughout the day in addition to avoiding added salt and fried/fatty foods  Home Management Concerns - since Mr. Vickerman's mother has declined in health and he is now living alone, his home environment has become cluttered and unsanitary; his POA is helping as she can but Mr. Voorheis says he feels ill equipped to do things like clean his bathroom and kitchen. He does have funds to pay for limited in home help. With his permission and that of his POA, I have reached out to [facility] again to ask them to contact his [place] to arrrange  a time to meet to discuss options for in home care/assistance for home management and light housekeeping.   Plan: I will reach to Dr. Luan Pulling re: Mr. Feild GI disturbances. I will follow up with Mr. Barringer and his POA by phone upon receipt of any changes/orders from Dr. Luan Pulling.   I will see Mr. Brash at home in 2 weeks.    [place] [facility]  [phone]

## 2016-10-06 DIAGNOSIS — I5022 Chronic systolic (congestive) heart failure: Secondary | ICD-10-CM | POA: Diagnosis not present

## 2016-10-06 DIAGNOSIS — E1121 Type 2 diabetes mellitus with diabetic nephropathy: Secondary | ICD-10-CM | POA: Diagnosis not present

## 2016-10-06 DIAGNOSIS — I129 Hypertensive chronic kidney disease with stage 1 through stage 4 chronic kidney disease, or unspecified chronic kidney disease: Secondary | ICD-10-CM | POA: Diagnosis not present

## 2016-10-06 DIAGNOSIS — F319 Bipolar disorder, unspecified: Secondary | ICD-10-CM | POA: Diagnosis not present

## 2016-10-07 DIAGNOSIS — E1121 Type 2 diabetes mellitus with diabetic nephropathy: Secondary | ICD-10-CM | POA: Diagnosis not present

## 2016-10-07 DIAGNOSIS — I129 Hypertensive chronic kidney disease with stage 1 through stage 4 chronic kidney disease, or unspecified chronic kidney disease: Secondary | ICD-10-CM | POA: Diagnosis not present

## 2016-10-07 DIAGNOSIS — F319 Bipolar disorder, unspecified: Secondary | ICD-10-CM | POA: Diagnosis not present

## 2016-10-07 DIAGNOSIS — I5022 Chronic systolic (congestive) heart failure: Secondary | ICD-10-CM | POA: Diagnosis not present

## 2016-10-15 ENCOUNTER — Other Ambulatory Visit: Payer: Self-pay | Admitting: *Deleted

## 2016-10-15 NOTE — Patient Outreach (Signed)
[facility]) Care Management   10/15/2016  [name] August 12, 1950 [id number]  [name] is an 66 y.o. male who lives in [place], [place]. He was hospitalized from 08/16/16 to 08/27/16 for treatment of acute on chronic combined systolic and diastolic heart failure. He also has a medical history which includes hypertension, diabetes mellitus type II, coronary artery disease, schizoaffective disorder bipolar type, renal insufficiency, and sleep apnea.   [name] was referred to [place] management for care coordination and assistance with chronic disease management by his cardiology provider. I am seeing [name] at his home today with his (durable) power of attorney [name] [phone]) present.   Subjective: "I think I'm doing ok except I still have real loose stools and I get awful heartburn after I eat."  Objective:  BP 126/82   Pulse 72   Wt 183 lb (83 kg)   SpO2 98%   BMI 31.41 kg/m   ROS  Physical Exam  Encounter Medications:   Outpatient Encounter Prescriptions as of 10/15/2016  Medication Sig Note  . AMITIZA 24 MCG capsule Take 1 capsule by mouth See admin instructions.  09/29/2016: Decreased to 1 cap twice a day, every other day but still having diarrhea; as of 09/29/16, changed to one cap every other day  . aspirin EC 81 MG tablet Take 81 mg by mouth daily.   . carvedilol (COREG) 12.5 MG tablet Take 1 tablet (12.5 mg total) by mouth 2 (two) times daily. 09/07/2016: POA reports medication was filled and brought home to patient; cannot find; POA will help patient find today; likely not taking x ~ 1 week  . furosemide (LASIX) 40 MG tablet Alternate 40 mg one day and 20 mg the next   . linagliptin (TRADJENTA) 5 MG TABS tablet Take 5 mg by mouth daily.   Marland Kitchen OLANZapine (ZYPREXA) 5 MG tablet Take 5 mg by mouth 2 (two) times daily.   Marland Kitchen omeprazole (PRILOSEC) 20 MG capsule    . potassium chloride SA (K-DUR,KLOR-CON) 20 MEQ tablet Take 20 mEq by mouth daily.   .  rosuvastatin (CRESTOR) 10 MG tablet Take 1 tablet (10 mg total) by mouth daily. 09/08/2016: Notified POA that Rx was faxed to [facility] and patient is to take 1 tablet daily qhs; POA will pick up from drug store and deliver to patient today 09/08/16  . rosuvastatin (CRESTOR) 10 MG tablet Take 1 tablet (10 mg total) by mouth daily.   . sacubitril-valsartan (ENTRESTO) 24-26 MG Take 1 tablet by mouth 2 (two) times daily.    Assessment:  66 year old gentleman with schizophrenia/bipolar disorder living alone in [place]. He has lived all his life with his mother, now 7 with dementia and residing in a skilled nursing facility, who managed every aspect of his day to day life. He had an MI earlier this year and was hospitalized in July with a CHF exacerbation. [name] has joint POA's who are actively involved in his care especially since his mother was placed in a nursing facility.  Medication Management concerns - [name]'s medications were reviewed in person today; he is taking all medications as prescribed including Crestor and Carvedilol and Zyprexa. [name] medications WERE previously kept in a brown paper bag and he was taking his medications from the bottles. However, I left a pill box with him and his POA has been filling it weekly in an effort to decrease confusion about medications and to increase adherence.  When I saw [name] at home last month he reported having soft stools and occasional diarrhea since starting amitiza. I reached out to Dr. Luan Patterson who advised that [name] could take the Amitiza every other day. He has been taking it as prescribed with the change to every other day. However, he is still having loose stools.   I reached out to Dr. Luan Patterson to notify him of [name]'s continued GI disturbance.   Acute/Chronic Health Condition (reflux) - [name] reports he still has "real bad acid that comes up in my throat and bad pain anytime I eat". He says that  lately, every time he eats he has significant epigastric pain.    Knowledge Deficits related to self health management of CHF- [name] IS weighing himself daily and recording. His weight was 177# when I saw him in late July, dropped as low as 174, and today was 183lb.    I discussed CHF signs and symptoms and how to use daily weights as a determinant measure for contacting his provider.I advised that he call his provider or me if he has weight gain of 3# overnight or 5# in a week or if he has shortness of breath, swelling/edema, chest pain, or other new or worsened symptom.     I reviewed prescribed heart healthy, low sodium diet with [name], advising that he eat 3 meals daily, not overloading on calories/portions at any one meal and emphasizing vegetables throughout the day in addition to avoiding added salt and fried/fatty foods  Plan: I will reach to Dr. Luan Patterson re: [name] GI disturbances. I will follow up with [name] and his POA by phone upon receipt of any changes/orders from Dr. Luan Patterson.    [place] [facility]  [phone]

## 2016-10-20 ENCOUNTER — Other Ambulatory Visit: Payer: Self-pay | Admitting: *Deleted

## 2016-10-20 NOTE — Progress Notes (Signed)
Thank you Delrae Rend!!

## 2016-10-20 NOTE — Patient Outreach (Signed)
[facility]) Care Management  10/20/2016  POSEY PETRIK 1950-02-19 [id number]   Outreach to Dr. Luan Pulling today to follow up on his recommendations re: Mr. Arnott ongoing complaint of loose stools on Amitiza and complaint of post prandial reflux.   Medication Management concerns - Mr. Pagliarulo's medications were reviewed in person on 10/15/16 during my home visit. I left a pill box with him and his POA has been filling it weekly in an effort to decrease confusion about medications and to increase adherence.   Mr. Geffre continues to report soft stools and occasional diarrhea on Amitiza even though he is now taking it, as per Dr. Luan Pulling' orders, once every other day.   Plan: I was unable to reach Dr. Luan Pulling' office by phone today but will fax this note as correspondence and will follow up by phone or in person by the end of this week.    [place] [facility]  [phone]

## 2016-10-22 ENCOUNTER — Ambulatory Visit: Payer: Self-pay | Admitting: *Deleted

## 2016-10-27 ENCOUNTER — Ambulatory Visit (HOSPITAL_COMMUNITY)
Admission: RE | Admit: 2016-10-27 | Discharge: 2016-10-27 | Disposition: A | Discharge status: Home / Self Care | Payer: Medicare Other | Source: Ambulatory Visit | Attending: Cardiovascular Disease | Admitting: Cardiovascular Disease

## 2016-10-27 DIAGNOSIS — I2510 Atherosclerotic heart disease of native coronary artery without angina pectoris: Secondary | ICD-10-CM | POA: Diagnosis not present

## 2016-10-27 DIAGNOSIS — G4733 Obstructive sleep apnea (adult) (pediatric): Secondary | ICD-10-CM | POA: Diagnosis not present

## 2016-10-27 DIAGNOSIS — I352 Nonrheumatic aortic (valve) stenosis with insufficiency: Secondary | ICD-10-CM | POA: Diagnosis not present

## 2016-10-27 DIAGNOSIS — I5033 Acute on chronic diastolic (congestive) heart failure: Secondary | ICD-10-CM | POA: Diagnosis not present

## 2016-10-27 LAB — ECHOCARDIOGRAM COMPLETE
AV Area VTI: 0.88 cm2
AV area mean vel ind: 0.59 cm2/m2
AV peak Index: 0.45
AV pk vel: 346 cm/s
AVA: 1.01 cm2
AVAREAMEANV: 1.17 cm2
AVAREAVTIIND: 0.51 cm2/m2
AVCELMEANRAT: 0.37
AVG: 18 mmHg
AVLVOTPG: 4 mmHg
AVPG: 48 mmHg
Ao pk vel: 0.28 m/s
CHL CUP AV VEL: 1.01
CHL CUP DOP CALC LVOT VTI: 23.1 cm
CHL CUP STROKE VOLUME: 53 mL
DOP CAL AO MEAN VELOCITY: 176 cm/s
E decel time: 359 msec
EERAT: 16.43
FS: 23 % — AB (ref 28–44)
IVS/LV PW RATIO, ED: 0.85
LA diam index: 2.59 cm/m2
LA vol A4C: 70.7 ml
LASIZE: 51 mm
LAVOL: 67.5 mL
LAVOLIN: 34.3 mL/m2
LEFT ATRIUM END SYS DIAM: 51 mm
LV E/e' medial: 16.43
LV PW d: 13.4 mm — AB (ref 0.6–1.1)
LV TDI E'MEDIAL: 6.42
LV dias vol index: 53 mL/m2
LV dias vol: 105 mL (ref 62–150)
LV e' LATERAL: 3.59 cm/s
LV sys vol index: 27 mL/m2
LV sys vol: 52 mL
LVEEAVG: 16.43
LVOT SV: 73 mL
LVOT area: 3.14 cm2
LVOT diameter: 20 mm
LVOT peak VTI: 0.32 cm
LVOTPV: 97.3 cm/s
MV Dec: 359
MV pk E vel: 59 m/s
MVPKAVEL: 87.7 m/s
P 1/2 time: 607 ms
RV sys press: 22 mmHg
Reg peak vel: 185 cm/s
Simpson's disk: 50
TDI e' lateral: 3.59
TR max vel: 185 cm/s
VTI: 71.8 cm
Valve area index: 0.51

## 2016-10-27 NOTE — Progress Notes (Signed)
*  PRELIMINARY RESULTS* Echocardiogram 2D Echocardiogram has been performed.  [name] 10/27/2016, 11:31 AM

## 2016-11-03 ENCOUNTER — Other Ambulatory Visit: Payer: Self-pay | Admitting: Urology

## 2016-11-03 ENCOUNTER — Ambulatory Visit (INDEPENDENT_AMBULATORY_CARE_PROVIDER_SITE_OTHER): Payer: Medicare Other | Admitting: Cardiovascular Disease

## 2016-11-03 ENCOUNTER — Encounter: Payer: Self-pay | Admitting: Cardiovascular Disease

## 2016-11-03 VITALS — BP 144/84 | HR 90 | Ht 64.0 in | Wt 190.0 lb

## 2016-11-03 DIAGNOSIS — N200 Calculus of kidney: Secondary | ICD-10-CM

## 2016-11-03 DIAGNOSIS — I429 Cardiomyopathy, unspecified: Secondary | ICD-10-CM | POA: Diagnosis not present

## 2016-11-03 DIAGNOSIS — I25118 Atherosclerotic heart disease of native coronary artery with other forms of angina pectoris: Secondary | ICD-10-CM | POA: Diagnosis not present

## 2016-11-03 DIAGNOSIS — G4733 Obstructive sleep apnea (adult) (pediatric): Secondary | ICD-10-CM

## 2016-11-03 DIAGNOSIS — I209 Angina pectoris, unspecified: Secondary | ICD-10-CM | POA: Diagnosis not present

## 2016-11-03 DIAGNOSIS — K219 Gastro-esophageal reflux disease without esophagitis: Secondary | ICD-10-CM

## 2016-11-03 DIAGNOSIS — I5042 Chronic combined systolic (congestive) and diastolic (congestive) heart failure: Secondary | ICD-10-CM

## 2016-11-03 DIAGNOSIS — I10 Essential (primary) hypertension: Secondary | ICD-10-CM

## 2016-11-03 DIAGNOSIS — Z79899 Other long term (current) drug therapy: Secondary | ICD-10-CM | POA: Diagnosis not present

## 2016-11-03 MED ORDER — SACUBITRIL-VALSARTAN 49-51 MG PO TABS: 1.0000 | ORAL_TABLET | Freq: Two times a day (BID) | ORAL | 6 refills | Status: DC
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
## 2016-11-03 NOTE — Patient Instructions (Addendum)
Your physician recommends that you schedule a follow-up appointment in:  3 months with Dr Bronson Ing     INCREASE [name] to 49/51 mg twice a day    Samples today, 1 box, lot number [id number], exp 11/2017    No lab work or tests ordered today.      Thank you for choosing [facility] !

## 2016-11-10 ENCOUNTER — Encounter (HOSPITAL_COMMUNITY): Payer: Self-pay

## 2016-11-10 ENCOUNTER — Ambulatory Visit (HOSPITAL_COMMUNITY)
Admission: RE | Admit: 2016-11-10 | Discharge: 2016-11-10 | Disposition: A | Discharge status: Home / Self Care | Payer: Medicare Other | Source: Ambulatory Visit | Attending: Urology | Admitting: Urology

## 2016-11-30 ENCOUNTER — Other Ambulatory Visit: Payer: Self-pay | Admitting: *Deleted
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
## 2016-12-01 DIAGNOSIS — Z23 Encounter for immunization: Secondary | ICD-10-CM | POA: Diagnosis not present

## 2016-12-02 ENCOUNTER — Ambulatory Visit: Payer: Self-pay | Admitting: *Deleted

## 2016-12-03 ENCOUNTER — Other Ambulatory Visit: Payer: Self-pay | Admitting: *Deleted

## 2016-12-03 NOTE — Patient Outreach (Addendum)
[facility]) Care Management   12/03/2016  LILBURN STRAW 1950/10/12 [id number]  KEYSHUN ELPERS is an 66 y.o. male who lives in [place], [place]. He was hospitalized from 08/16/16 to 08/27/16 for treatment of acute on chronic combined systolic and diastolic heart failure. He also has a medical history which includes hypertension, diabetes mellitus type II, coronary artery disease, schizoaffective disorder bipolar type, renal insufficiency, and sleep apnea.    Subjective: "I think I'm doing pretty good. I can call you if I get to feeling bad."  Objective:  BP 116/80   Pulse 73   Wt 186 lb 2 oz (84.4 kg)   SpO2 97%   BMI 31.95 kg/m   Review of Systems  Constitutional: Negative.   HENT: Negative.   Eyes: Negative.   Respiratory: Negative.  Negative for cough, sputum production, shortness of breath and wheezing.   Cardiovascular: Negative.  Negative for chest pain, palpitations, orthopnea and leg swelling.  Gastrointestinal: Positive for heartburn.       Reports heartburn if eats too fast or too much  Genitourinary: Negative.   Musculoskeletal: Negative.  Negative for falls.  Skin: Negative.   Neurological: Negative.  Negative for dizziness.  Psychiatric/Behavioral: Negative.     Physical Exam  Constitutional: Vital signs are normal. He appears well-developed and well-nourished. He is active. He has a sickly appearance. He does not appear ill.  Cardiovascular: Normal rate and regular rhythm.   Murmur heard. Respiratory: Effort normal and breath sounds normal. He has no wheezes. He has no rhonchi. He has no rales.  GI: Soft. Bowel sounds are normal.  Neurological: He is alert.  Skin: Skin is warm, dry and intact.  Psychiatric: He has a normal mood and affect. His speech is normal and behavior is normal. Judgment and thought content normal. Cognition and memory are normal.    Encounter Medications:   Outpatient Encounter Prescriptions as of 12/03/2016  Medication  Sig Note  . AMITIZA 24 MCG capsule Take 1 capsule by mouth See admin instructions.  09/29/2016: Decreased to 1 cap twice a day, every other day but still having diarrhea; as of 09/29/16, changed to one cap every other day  . aspirin EC 81 MG tablet Take 81 mg by mouth daily.   . carvedilol (COREG) 12.5 MG tablet Take 1 tablet (12.5 mg total) by mouth 2 (two) times daily.   . furosemide (LASIX) 40 MG tablet Alternate 40 mg one day and 20 mg the next   . linagliptin (TRADJENTA) 5 MG TABS tablet Take 5 mg by mouth daily.   Marland Kitchen OLANZapine (ZYPREXA) 5 MG tablet Take 5 mg by mouth 2 (two) times daily.   Marland Kitchen omeprazole (PRILOSEC) 20 MG capsule    . potassium chloride SA (K-DUR,KLOR-CON) 20 MEQ tablet Take 20 mEq by mouth daily.   . rosuvastatin (CRESTOR) 10 MG tablet Take 1 tablet (10 mg total) by mouth daily.   . sacubitril-valsartan (ENTRESTO) 49-51 MG Take 1 tablet by mouth 2 (two) times daily.    Assessment:  66 year old gentleman with schizophrenia/bipolar disorder living alone in [place]. He has lived all his life with his mother, now 90 with dementia and residing in a skilled nursing facility, who managed every aspect of his day to day life. He had an MI earlier this year and was hospitalized in July with a CHF exacerbation. Mr. Dupree has joint POA's who are actively involved in his care especially since his mother was placed in a nursing  facility.  Medication Management concerns - Mr. Clos's medications were reviewed in person today; he is taking all medications as prescribed. He was having difficulty managing medications prior to our involvement. I met with Mr. Gleaves and his POA x 2 and she is now filling a pill box for Mr. Ermalinda Barrios weekly.  Immunizations - Mr. Pettis reports that he had his flu shot last week at [facility]. He will try to find the paperwork and will drop it off at Dr. Luan Pulling' office.    GI Disturbances:   Diarrhea - Since I have been seeing Mr. Gladden, he has complained of having  soft stools and occasional diarrhea since starting amitiza. I reached out to Dr. Luan Pulling who at first adjusted Mr. Scheller's amitiza dose to once daily then every other day and finally now he is taking it twice weekly. His diarrhea has subsided and today he says he has not had any further episodes of diarrhea or bowel urgency. He also denies constipation.   Acute/Chronic Health Condition (reflux) - Mr. Bilal reports he still has "real bad acid that comes up in my throat and bad pain anytime I eat". He says that lately, every time he eats he has significant epigastric pain. I reviewed with him again today recommended dietary guidelines which include eating smaller more frequent meals, avoiding spicy foods, and avoiding eating quickly.   Knowledge Deficits related to self health management of CHF- Mr. Corbit IS weighing himself daily and recording. His weight was 177# when I saw him in late July, dropped as low as 174, and today was 186.2lb.   I have discussed CHF signs and symptoms on each visit with Mr. Ammon and worked on teaching Mr. Bhargava how to use daily weights as a determinant measure for contacting his provider.I advised that he call his provider if he has weight gain of 3# overnight or 5# in a week or if he has shortness of breath, swelling/edema, chest pain, or other new or worsened symptom.    On each home visit, I have eviewed prescribed heart healthy, low sodium diet with Mr. Heidrich, advising that he eat 3 meals daily, not overloading on calories/portions at any one meal and emphasizing vegetables throughout the day in addition to avoiding added salt and fried/fatty foods   Plan: Mr. Sudol has not met all case management goals but has made significant progress. I have spent time with his POA who fills his pill box weekly and checks on him routinely. I will discharge him from community case management services. He understands that he can call at any time should new needs arise.    THN CM Care Plan Problem One     Most Recent Value  Care Plan Problem One  Medication Managmeent Concerns  Role Documenting the Problem One  Care Management Coordinator  Care Plan for Problem One  Not Active  THN Long Term Goal   Over the next 31 days, patient will take all medications as prescribed  THN Long Term Goal Start Date  09/07/16  [facility] Long Term Goal Met Date  10/15/16  Interventions for Problem One Long Term Goal  Medications reviewed with patient and POA in person including purpose and prescribing instructions  [facility] CM Short Term Goal #1   Over the next 30 days, patient will take ALL prescribed medications (including Carvedilol and Crestor)  [facility] CM Short Term Goal #1 Start Date  09/07/16  [facility] CM Short Term Goal #1 Met Date  10/15/16  [facility] CM Short  Term Goal #2   Over the next 30 days, patient/POA will demonstrate appropriate use of pill box  THN CM Short Term Goal #2 Start Date  09/07/16  [facility] CM Short Term Goal #2 Met Date  10/15/16  Interventions for Short Term Goal #2  pill box and medications reviewed    [facility] CM Care Plan Problem Two     Most Recent Value  Care Plan Problem Two  Knowledge Deficits related to CHF self health management  Role Documenting the Problem Two  Care Management Coordinator  Care Plan for Problem Two  Not Active  Interventions for Problem Two Long Term Goal   reviewed established guidelines for CHF disease management with patient,  reviewed STOP light tool and recommendations about self health monitoring and when to call provider  THN Long Term Goal  Over the next 60 days, patient and POA will demonstrate and verbalize understanding of plan of care for self heatlh management of CHF  THN Long Term Goal Start Date  09/07/16  [facility] Long Term Goal Met Date  12/03/16  [facility]N CM Short Term Goal #1   Over the next 30 days, patient will weigh himself daily and record, calling provider for weight gain of 3# overnight or 5# in a week  THN CM Short Term Goal #1 Start  Date  10/15/16  [facility] CM Short Term Goal #1 Met Date   12/03/16  Interventions for Short Term Goal #2   reviewed with patient importance of ongoing self health montioring and calling provider for ffindings outside established parameters,  separate communication sent to cardiology provider about patient current weight and today's assessment  THN CM Short Term Goal #2   Over the next 30 days, patient/POA will verbalize understanding of signs and symptoms of CHF  THN CM Short Term Goal #2 Start Date  10/15/16  Interventions for Short Term Goal #2  utilizing teachback method, Emmi educational tools, and stop light tool, discussed signs and symptoms of CHF with patient/POA and when to call provider for symptoms  THN CM Short Term Goal #3   Over the next 30 days, patient will modify sodium intake by not adding salt to foods and avoiding fried foods when he eats out  [facility] CM Short Term Goal #3 Start Date  10/15/16  Interventions for Short Term Goal #3  reviewed established and recommended dietary guidelines with patient, providing rationale      [place] Care Management  [phone]

## 2016-12-07 NOTE — Progress Notes (Signed)
Thank you Alisa. Please send this to PCP if you have not already. He may need to address Assisted living or other arrangements for better self care in this patient. Uncertain that cardiology can be helpful with placement or other assistance in this case. Happy to assist if I can.

## 2016-12-08 DIAGNOSIS — I5022 Chronic systolic (congestive) heart failure: Secondary | ICD-10-CM | POA: Diagnosis not present

## 2016-12-08 DIAGNOSIS — I2510 Atherosclerotic heart disease of native coronary artery without angina pectoris: Secondary | ICD-10-CM | POA: Diagnosis not present

## 2016-12-08 DIAGNOSIS — K210 Gastro-esophageal reflux disease with esophagitis: Secondary | ICD-10-CM | POA: Diagnosis not present

## 2016-12-08 DIAGNOSIS — N182 Chronic kidney disease, stage 2 (mild): Secondary | ICD-10-CM | POA: Diagnosis not present

## 2016-12-11 ENCOUNTER — Encounter: Payer: Self-pay | Admitting: Gastroenterology

## 2017-01-29 ENCOUNTER — Telehealth: Payer: Self-pay

## 2017-01-29 ENCOUNTER — Encounter: Payer: Self-pay | Admitting: Gastroenterology

## 2017-01-29 ENCOUNTER — Other Ambulatory Visit: Payer: Self-pay

## 2017-01-29 ENCOUNTER — Ambulatory Visit (INDEPENDENT_AMBULATORY_CARE_PROVIDER_SITE_OTHER): Payer: Medicare Other | Admitting: Gastroenterology

## 2017-01-29 VITALS — BP 128/80 | HR 82 | Temp 97.1°F | Ht 64.0 in | Wt 185.6 lb

## 2017-01-29 DIAGNOSIS — Z1211 Encounter for screening for malignant neoplasm of colon: Secondary | ICD-10-CM

## 2017-01-29 DIAGNOSIS — Z7189 Other specified counseling: Secondary | ICD-10-CM | POA: Insufficient documentation

## 2017-01-29 DIAGNOSIS — I25118 Atherosclerotic heart disease of native coronary artery with other forms of angina pectoris: Secondary | ICD-10-CM | POA: Diagnosis not present

## 2017-01-29 DIAGNOSIS — R1310 Dysphagia, unspecified: Secondary | ICD-10-CM

## 2017-01-29 DIAGNOSIS — K219 Gastro-esophageal reflux disease without esophagitis: Secondary | ICD-10-CM | POA: Diagnosis not present

## 2017-01-29 MED ORDER — NA SULFATE-K SULFATE-MG SULF 17.5-3.13-1.6 GM/177ML PO SOLN: 1.0000 | ORAL | 0 refills | Status: DC

## 2017-01-29 NOTE — Progress Notes (Signed)
Primary Care Physician:  Sinda Du, MD Primary Gastroenterologist:  Dr. Oneida Alar   Chief Complaint  Patient presents with  . Gastroesophageal Reflux    coughs up mucous    HPI:   [name] is a 66 y.o. male presenting today at the request of Sinda Du, MD secondary to GERD.   States he feels good but the only complaint he has is chronic GERD. States he will be eating and "can't hold it" and the food will just come back up. States he chews well. Feels nauseated with eating, stating "acid reflux makes me sick". Has burning in esophagus. He thinks he has been on Protonix for at least a year and maybe longer. GERD symptoms have gotten worse. Taking Tums as well. Appears he has taken omeprazole as well. No prior colonoscopy. No prior EGD.   Amitiza 24 mcg one cap every other day. Using this novel dosing as stronger dosing cause diarrhea. No rectal bleeding.   Past Medical History:  Diagnosis Date  . Arthritis   . Bipolar disorder ([place])   . CAD (coronary artery disease) 08/16/2016  . Cellulitis and abscess of foot 03/05/2016  . Chronic combined systolic and diastolic heart failure ([place])   . Diabetes mellitus without complication ([place])    boderline  . Heart murmur 1995  . Hypertension   . Kidney stones   . Myocardial infarction ([place])   . OSA (obstructive sleep apnea)    no cpap, can't tolerate  . Schizoaffective disorder, bipolar type ([place]) 04/25/2016    Past Surgical History:  Procedure Laterality Date  . APPENDECTOMY    . CHOLECYSTECTOMY N/A 10/20/2013   Procedure: LAPAROSCOPIC CHOLECYSTECTOMY;  Surgeon: [name], MD;  Location: [facility];  Service: General;  Laterality: N/A;  . HIP SURGERY    . KNEE SURGERY      Current Outpatient Medications  Medication Sig Dispense Refill  . AMITIZA 24 MCG capsule Take 1 capsule by mouth See admin instructions.     Marland Kitchen aspirin EC 81 MG tablet Take 81 mg by mouth daily.    . carvedilol (COREG) 12.5 MG tablet Take 1  tablet (12.5 mg total) by mouth 2 (two) times daily. 180 tablet 3  . furosemide (LASIX) 40 MG tablet Alternate 40 mg one day and 20 mg the next (Patient taking differently: Take 40 mg by mouth daily. ) 90 tablet 3  . linagliptin (TRADJENTA) 5 MG TABS tablet Take 5 mg by mouth daily.    Marland Kitchen OLANZapine (ZYPREXA) 5 MG tablet Take 5 mg by mouth 2 (two) times daily.    . pantoprazole (PROTONIX) 40 MG tablet Take 1 tablet by mouth daily.    . potassium chloride SA (K-DUR,KLOR-CON) 20 MEQ tablet Take 20 mEq by mouth daily.    . rosuvastatin (CRESTOR) 10 MG tablet Take 1 tablet (10 mg total) by mouth daily. 90 tablet 3  . sacubitril-valsartan (ENTRESTO) 49-51 MG Take 1 tablet by mouth 2 (two) times daily. 60 tablet 6   No current facility-administered medications for this visit.     Allergies as of 01/29/2017 - Review Complete 01/29/2017  Allergen Reaction Noted  . Codeine Nausea And Vomiting 09/27/2013  . Morphine and related Nausea And Vomiting 09/27/2013    Family History  Problem Relation Age of Onset  . Hypertension Mother   . Dementia Mother   . Colon cancer Neg Hx   . Colon polyps Neg Hx     Social History  Socioeconomic History  . Marital status: Divorced    Spouse name: Not on file  . Number of children: Not on file  . Years of education: Not on file  . Highest education level: Not on file  Social Needs  . Financial resource strain: Not on file  . Food insecurity - worry: Not on file  . Food insecurity - inability: Not on file  . Transportation needs - medical: Not on file  . Transportation needs - non-medical: Not on file  Occupational History  . Not on file  Tobacco Use  . Smoking status: Never Smoker  . Smokeless tobacco: Never Used  Substance and Sexual Activity  . Alcohol use: No  . Drug use: No  . Sexual activity: Not on file  Other Topics Concern  . Not on file  Social History Narrative  . Not on file    Review of Systems: Gen: Denies any fever, chills,  fatigue, weight loss, lack of appetite.  CV: Denies chest pain, heart palpitations, peripheral edema, syncope.  Resp: Denies shortness of breath at rest or with exertion. Denies wheezing or cough.  GI: see HPI  GU : Denies urinary burning, urinary frequency, urinary hesitancy MS: Denies joint pain, muscle weakness, cramps, or limitation of movement.  Derm: Denies rash, itching, dry skin Psych: Denies depression, anxiety, memory loss, and confusion Heme: Denies bruising, bleeding, and enlarged lymph nodes.  Physical Exam: BP 128/80   Pulse 82   Temp (!) 97.1 F (36.2 C) (Oral)   Ht 5\' 4"  (1.626 m)   Wt 185 lb 9.6 oz (84.2 kg)   BMI 31.86 kg/m  General:   Alert and oriented. Pleasant and cooperative. Well-nourished and well-developed.  Head:  Normocephalic and atraumatic. Eyes:  Without icterus, sclera clear and conjunctiva pink.  Ears:  Normal auditory acuity. Nose:  No deformity, discharge,  or lesions. Mouth:  No deformity or lesions, oral mucosa pink.  Lungs:  Clear to auscultation bilaterally. No wheezes, rales, or rhonchi. No distress.  Heart:  S1, S2 present with systolic murmur noted  Abdomen:  +BS, soft, non-tender and non-distended. No HSM noted. No guarding or rebound. No masses appreciated. Small umbilical hernia noted.   Rectal:  Deferred  Msk:  Symmetrical without gross deformities. Normal posture. Extremities:  Without edema. Neurologic:  Alert and  oriented x4 Psych:  Alert and cooperative. Normal mood and affect.

## 2017-01-29 NOTE — Telephone Encounter (Signed)
Called pt's POA Langley Gauss Toms) to set up TCS/EGD/DIL with Propofol with [facility]. Procedure scheduled for 03/09/17 at 12:15pm. Instructions mailed to her per her request. Rx sent to pharmacy for prep. Orders entered.  Called Langley Gauss back and informed her of pre-op appt 03/03/17 at 11:00am. Letter mailed with his instructions.

## 2017-01-29 NOTE — Patient Instructions (Addendum)
I would like for you to try the samples of Dexilant once daily. You will stop the Pantoprazole (Protonix) while trying this. Please call me with how you do, and I can send in a prescription if you like it!  I have attached a reflux diet sheet to follow as best as you can.  We are scheduling you for an upper endoscopy with dilation with Dr. Oneida Alar. We can also do a routine screening colonoscopy at the same time. Do not take Tradjenta the morning of the procedure.   We will see you back after the procedures to see how you are doing.  It was a pleasure to see you today. I strive to create trusting relationships with patients to provide genuine, compassionate, and quality care. I value your feedback. If you receive a survey regarding your visit,  I greatly appreciate you the taking time to fill this out.   Annitta Needs, PhD, ANP-BC [facility]    Food Choices for Gastroesophageal Reflux Disease, Adult When you have gastroesophageal reflux disease (GERD), the foods you eat and your eating habits are very important. Choosing the right foods can help ease your discomfort. What guidelines do I need to follow?  Choose fruits, vegetables, whole grains, and low-fat dairy products.  Choose low-fat meat, fish, and poultry.  Limit fats such as oils, salad dressings, butter, nuts, and avocado.  Keep a food diary. This helps you identify foods that cause symptoms.  Avoid foods that cause symptoms. These may be different for everyone.  Eat small meals often instead of 3 large meals a day.  Eat your meals slowly, in a place where you are relaxed.  Limit fried foods.  Cook foods using methods other than frying.  Avoid drinking alcohol.  Avoid drinking large amounts of liquids with your meals.  Avoid bending over or lying down until 2-3 hours after eating. What foods are not recommended? These are some foods and drinks that may make your symptoms worse: Vegetables Tomatoes.  Tomato juice. Tomato and spaghetti sauce. Chili peppers. Onion and garlic. Horseradish. Fruits Oranges, grapefruit, and lemon (fruit and juice). Meats High-fat meats, fish, and poultry. This includes hot dogs, ribs, ham, sausage, salami, and bacon. Dairy Whole milk and chocolate milk. Sour cream. Cream. Butter. Ice cream. Cream cheese. Drinks Coffee and tea. Bubbly (carbonated) drinks or energy drinks. Condiments Hot sauce. Barbecue sauce. Sweets/Desserts Chocolate and cocoa. Donuts. Peppermint and spearmint. Fats and Oils High-fat foods. This includes [place] fries and potato chips. Other Vinegar. Strong spices. This includes black pepper, white pepper, red pepper, cayenne, curry powder, cloves, ginger, and chili powder. The items listed above may not be a complete list of foods and drinks to avoid. Contact your dietitian for more information. This information is not intended to replace advice given to you by your health care provider. Make sure you discuss any questions you have with your health care provider. Document Released: 08/04/2011 Document Revised: 07/11/2015 Document Reviewed: 12/07/2012 Elsevier Interactive Patient Education  2017 [employer].

## 2017-02-03 NOTE — Progress Notes (Signed)
cc'ed to pcp °

## 2017-02-03 NOTE — Assessment & Plan Note (Signed)
Possibly related to uncontrolled GERD, possible web, ring, or stricture. Motility disorder also in differentials. EGD/dilation as planned. Follow-up thereafter.

## 2017-02-03 NOTE — Assessment & Plan Note (Signed)
No prior colonoscopy. No concerning lower GI symptoms. Discussed routine screening at time of EGD.   Proceed with colonoscopy with Dr. Oneida Alar in the near future. The risks, benefits, and alternatives have been discussed in detail with the patient. They state understanding and desire to proceed.  Propofol due to polypharmacy

## 2017-02-03 NOTE — Assessment & Plan Note (Signed)
66 year old very pleasant male with history of chronic GERD, previously taking Prilosec and now on Protonix. Breakthrough noted and taking Tums, and he notes worsening of reflux symptoms. Regurgitation noted and solid food dysphagia. No prior EGD. Will pursue EGD in near future and trial samples of Dexilant in interim. Dietary and behavior modification discussed.   Proceed with upper endoscopy/dilation in the near future with Dr. Oneida Alar. The risks, benefits, and alternatives have been discussed in detail with patient. They have stated understanding and desire to proceed.  Propofol due to polypharmacy Dexilant samples provided Follow-up after procedure

## 2017-02-05 ENCOUNTER — Ambulatory Visit: Payer: Self-pay | Admitting: Cardiovascular Disease

## 2017-02-05 ENCOUNTER — Encounter: Payer: Self-pay | Admitting: Cardiovascular Disease

## 2017-02-16 DIAGNOSIS — C719 Malignant neoplasm of brain, unspecified: Secondary | ICD-10-CM

## 2017-02-16 DIAGNOSIS — C229 Malignant neoplasm of liver, not specified as primary or secondary: Secondary | ICD-10-CM

## 2017-02-16 HISTORY — DX: Malignant neoplasm of brain, unspecified: C71.9

## 2017-02-16 HISTORY — DX: Malignant neoplasm of liver, not specified as primary or secondary: C22.9

## 2017-02-17 ENCOUNTER — Telehealth: Payer: Self-pay | Admitting: Gastroenterology

## 2017-02-17 MED ORDER — DEXLANSOPRAZOLE 60 MG PO CPDR: 60.0000 mg | DELAYED_RELEASE_CAPSULE | Freq: Every day | ORAL | 3 refills | Status: DC

## 2017-02-17 NOTE — Telephone Encounter (Signed)
Done

## 2017-02-17 NOTE — Telephone Encounter (Signed)
Pt has used all the dexilant samples and needs a prescription called into [facility] in [place]

## 2017-02-17 NOTE — Telephone Encounter (Signed)
Forwarding to refill box.  

## 2017-03-01 NOTE — Patient Instructions (Signed)
[name]  03/01/2017     @PREFPERIOPPHARMACY @   Your procedure is scheduled on  03/09/2017 .  Report to [facility] at  1015   A.M.  Call this number if you have problems the morning of surgery:  [phone]   Remember:  Do not eat food or drink liquids after midnight.  Take these medicines the morning of surgery with A SIP OF WATER  Coreg, dexilant or protonix, zyprexa, entresto.   Do not wear jewelry, make-up or nail polish.  Do not wear lotions, powders, or perfumes, or deodorant.  Do not shave 48 hours prior to surgery.  Men may shave face and neck.  Do not bring valuables to the hospital.  [facility] is not responsible for any belongings or valuables.  Contacts, dentures or bridgework may not be worn into surgery.  Leave your suitcase in the car.  After surgery it may be brought to your room.  For patients admitted to the hospital, discharge time will be determined by your treatment team.  Patients discharged the day of surgery will not be allowed to drive home.   Name and phone number of your driver:   family Special instructions:  Follow the diet and prep instructions given to you by Dr Nona Dell office.  Please read over the following fact sheets that you were given. Anesthesia Post-op Instructions and Care and Recovery After Surgery       Esophagogastroduodenoscopy Esophagogastroduodenoscopy (EGD) is a procedure to examine the lining of the esophagus, stomach, and first part of the small intestine (duodenum). This procedure is done to check for problems such as inflammation, bleeding, ulcers, or growths. During this procedure, a long, flexible, lighted tube with a camera attached (endoscope) is inserted down the throat. Tell a health care provider about:  Any allergies you have.  All medicines you are taking, including vitamins, herbs, eye drops, creams, and over-the-counter medicines.  Any problems you or family members have had  with anesthetic medicines.  Any blood disorders you have.  Any surgeries you have had.  Any medical conditions you have.  Whether you are pregnant or may be pregnant. What are the risks? Generally, this is a safe procedure. However, problems may occur, including:  Infection.  Bleeding.  A tear (perforation) in the esophagus, stomach, or duodenum.  Trouble breathing.  Excessive sweating.  Spasms of the larynx.  A slowed heartbeat.  Low blood pressure.  What happens before the procedure?  Follow instructions from your health care provider about eating or drinking restrictions.  Ask your health care provider about: ? Changing or stopping your regular medicines. This is especially important if you are taking diabetes medicines or blood thinners. ? Taking medicines such as aspirin and ibuprofen. These medicines can thin your blood. Do not take these medicines before your procedure if your health care provider instructs you not to.  Plan to have someone take you home after the procedure.  If you wear dentures, be ready to remove them before the procedure. What happens during the procedure?  To reduce your risk of infection, your health care team will wash or sanitize their hands.  An IV tube will be put in a vein in your hand or arm. You will get medicines and fluids through this tube.  You will be given one or more of the following: ? A medicine to help you relax (sedative). ? A medicine to  numb the area (local anesthetic). This medicine may be sprayed into your throat. It will make you feel more comfortable and keep you from gagging or coughing during the procedure. ? A medicine for pain.  A mouth guard may be placed in your mouth to protect your teeth and to keep you from biting on the endoscope.  You will be asked to lie on your left side.  The endoscope will be lowered down your throat into your esophagus, stomach, and duodenum.  Air will be put into the  endoscope. This will help your health care provider see better.  The lining of your esophagus, stomach, and duodenum will be examined.  Your health care provider may: ? Take a tissue sample so it can be looked at in a lab (biopsy). ? Remove growths. ? Remove objects (foreign bodies) that are stuck. ? Treat any bleeding with medicines or other devices that stop tissue from bleeding. ? Widen (dilate) or stretch narrowed areas of your esophagus and stomach.  The endoscope will be taken out. The procedure may vary among health care providers and hospitals. What happens after the procedure?  Your blood pressure, heart rate, breathing rate, and blood oxygen level will be monitored often until the medicines you were given have worn off.  Do not eat or drink anything until the numbing medicine has worn off and your gag reflex has returned. This information is not intended to replace advice given to you by your health care provider. Make sure you discuss any questions you have with your health care provider. Document Released: 06/05/2004 Document Revised: 07/11/2015 Document Reviewed: 12/27/2014 Elsevier Interactive Patient Education  2018 [employer]. Esophagogastroduodenoscopy, Care After Refer to this sheet in the next few weeks. These instructions provide you with information about caring for yourself after your procedure. Your health care provider may also give you more specific instructions. Your treatment has been planned according to current medical practices, but problems sometimes occur. Call your health care provider if you have any problems or questions after your procedure. What can I expect after the procedure? After the procedure, it is common to have:  A sore throat.  Nausea.  Bloating.  Dizziness.  Fatigue.  Follow these instructions at home:  Do not eat or drink anything until the numbing medicine (local anesthetic) has worn off and your gag reflex has returned. You  will know that the local anesthetic has worn off when you can swallow comfortably.  Do not drive for 24 hours if you received a medicine to help you relax (sedative).  If your health care provider took a tissue sample for testing during the procedure, make sure to get your test results. This is your responsibility. Ask your health care provider or the department performing the test when your results will be ready.  Keep all follow-up visits as told by your health care provider. This is important. Contact a health care provider if:  You cannot stop coughing.  You are not urinating.  You are urinating less than usual. Get help right away if:  You have trouble swallowing.  You cannot eat or drink.  You have throat or chest pain that gets worse.  You are dizzy or light-headed.  You faint.  You have nausea or vomiting.  You have chills.  You have a fever.  You have severe abdominal pain.  You have black, tarry, or bloody stools. This information is not intended to replace advice given to you by your health care provider. Make  sure you discuss any questions you have with your health care provider. Document Released: 01/20/2012 Document Revised: 07/11/2015 Document Reviewed: 12/27/2014 Elsevier Interactive Patient Education  2018 [employer].  Esophageal Dilatation Esophageal dilatation is a procedure to open a blocked or narrowed part of the esophagus. The esophagus is the long tube in your throat that carries food and liquid from your mouth to your stomach. The procedure is also called esophageal dilation. You may need this procedure if you have a buildup of scar tissue in your esophagus that makes it difficult, painful, or even impossible to swallow. This can be caused by gastroesophageal reflux disease (GERD). In rare cases, people need this procedure because they have cancer of the esophagus or a problem with the way food moves through the esophagus. Sometimes you may need to  have another dilatation to enlarge the opening of the esophagus gradually. Tell a health care provider about:  Any allergies you have.  All medicines you are taking, including vitamins, herbs, eye drops, creams, and over-the-counter medicines.  Any problems you or family members have had with anesthetic medicines.  Any blood disorders you have.  Any surgeries you have had.  Any medical conditions you have.  Any antibiotic medicines you are required to take before dental procedures. What are the risks? Generally, this is a safe procedure. However, problems can occur and include:  Bleeding from a tear in the lining of the esophagus.  A hole (perforation) in the esophagus.  What happens before the procedure?  Do not eat or drink anything after midnight on the night before the procedure or as directed by your health care provider.  Ask your health care provider about changing or stopping your regular medicines. This is especially important if you are taking diabetes medicines or blood thinners.  Plan to have someone take you home after the procedure. What happens during the procedure?  You will be given a medicine that makes you relaxed and sleepy (sedative).  A medicine may be sprayed or gargled to numb the back of the throat.  Your health care provider can use various instruments to do an esophageal dilatation. During the procedure, the instrument used will be placed in your mouth and passed down into your esophagus. Options include: ? Simple dilators. This instrument is carefully placed in the esophagus to stretch it. ? Guided wire bougies. In this method, a flexible tube (endoscope) is used to insert a wire into the esophagus. The dilator is passed over this wire to enlarge the esophagus. Then the wire is removed. ? Balloon dilators. An endoscope with a small balloon at the end is passed down into the esophagus. Inflating the balloon gently stretches the esophagus and opens it  up. What happens after the procedure?  Your blood pressure, heart rate, breathing rate, and blood oxygen level will be monitored often until the medicines you were given have worn off.  Your throat may feel slightly sore and will probably still feel numb. This will improve slowly over time.  You will not be allowed to eat or drink until the throat numbness has resolved.  If this is a same-day procedure, you may be allowed to go home once you have been able to drink, urinate, and sit on the edge of the bed without nausea or dizziness.  If this is a same-day procedure, you should have a friend or family member with you for the next 24 hours after the procedure. This information is not intended to replace advice given  to you by your health care provider. Make sure you discuss any questions you have with your health care provider. Document Released: 03/26/2005 Document Revised: 07/11/2015 Document Reviewed: 06/14/2013 Elsevier Interactive Patient Education  [employer].  Colonoscopy, Adult A colonoscopy is an exam to look at the large intestine. It is done to check for problems, such as:  Lumps (tumors).  Growths (polyps).  Swelling (inflammation).  Bleeding.  What happens before the procedure? Eating and drinking Follow instructions from your doctor about eating and drinking. These instructions may include:  A few days before the procedure - follow a low-fiber diet. ? Avoid nuts. ? Avoid seeds. ? Avoid dried fruit. ? Avoid raw fruits. ? Avoid vegetables.  1-3 days before the procedure - follow a clear liquid diet. Avoid liquids that have red or purple dye. Drink only clear liquids, such as: ? Clear broth or bouillon. ? Black coffee or tea. ? Clear juice. ? Clear soft drinks or sports drinks. ? Gelatin dessert. ? Popsicles.  On the day of the procedure - do not eat or drink anything during the 2 hours before the procedure.  Bowel prep If you were prescribed an oral  bowel prep:  Take it as told by your doctor. Starting the day before your procedure, you will need to drink a lot of liquid. The liquid will cause you to poop (have bowel movements) until your poop is almost clear or light green.  If your skin or butt gets irritated from diarrhea, you may: ? Wipe the area with wipes that have medicine in them, such as adult wet wipes with aloe and vitamin E. ? Put something on your skin that soothes the area, such as petroleum jelly.  If you throw up (vomit) while drinking the bowel prep, take a break for up to 60 minutes. Then begin the bowel prep again. If you keep throwing up and you cannot take the bowel prep without throwing up, call your doctor.  General instructions  Ask your doctor about changing or stopping your normal medicines. This is important if you take diabetes medicines or blood thinners.  Plan to have someone take you home from the hospital or clinic. What happens during the procedure?  An IV tube may be put into one of your veins.  You will be given medicine to help you relax (sedative).  To reduce your risk of infection: ? Your doctors will wash their hands. ? Your anal area will be washed with soap.  You will be asked to lie on your side with your knees bent.  Your doctor will get a long, thin, flexible tube ready. The tube will have a camera and a light on the end.  The tube will be put into your anus.  The tube will be gently put into your large intestine.  Air will be delivered into your large intestine to keep it open. You may feel some pressure or cramping.  The camera will be used to take photos.  A small tissue sample may be removed from your body to be looked at under a microscope (biopsy). If any possible problems are found, the tissue will be sent to a lab for testing.  If small growths are found, your doctor may remove them and have them checked for cancer.  The tube that was put into your anus will be slowly  removed. The procedure may vary among doctors and hospitals. What happens after the procedure?  Your doctor will check on you often  until the medicines you were given have worn off.  Do not drive for 24 hours after the procedure.  You may have a small amount of blood in your poop.  You may pass gas.  You may have mild cramps or bloating in your belly (abdomen).  It is up to you to get the results of your procedure. Ask your doctor, or the department performing the procedure, when your results will be ready. This information is not intended to replace advice given to you by your health care provider. Make sure you discuss any questions you have with your health care provider. Document Released: 03/07/2010 Document Revised: 12/04/2015 Document Reviewed: 04/16/2015 Elsevier Interactive Patient Education  2017 [employer]  Colonoscopy, Adult, Care After This sheet gives you information about how to care for yourself after your procedure. Your health care provider may also give you more specific instructions. If you have problems or questions, contact your health care provider. What can I expect after the procedure? After the procedure, it is common to have:  A small amount of blood in your stool for 24 hours after the procedure.  Some gas.  Mild abdominal cramping or bloating.  Follow these instructions at home: General instructions   For the first 24 hours after the procedure: ? Do not drive or use machinery. ? Do not sign important documents. ? Do not drink alcohol. ? Do your regular daily activities at a slower pace than normal. ? Eat soft, easy-to-digest foods. ? Rest often.  Take over-the-counter or prescription medicines only as told by your health care provider.  It is up to you to get the results of your procedure. Ask your health care provider, or the department performing the procedure, when your results will be ready. Relieving cramping and bloating  Try  walking around when you have cramps or feel bloated.  Apply heat to your abdomen as told by your health care provider. Use a heat source that your health care provider recommends, such as a moist heat pack or a heating pad. ? Place a towel between your skin and the heat source. ? Leave the heat on for 20-30 minutes. ? Remove the heat if your skin turns bright red. This is especially important if you are unable to feel pain, heat, or cold. You may have a greater risk of getting burned. Eating and drinking  Drink enough fluid to keep your urine clear or pale yellow.  Resume your normal diet as instructed by your health care provider. Avoid heavy or fried foods that are hard to digest.  Avoid drinking alcohol for as long as instructed by your health care provider. Contact a health care provider if:  You have blood in your stool 2-3 days after the procedure. Get help right away if:  You have more than a small spotting of blood in your stool.  You pass large blood clots in your stool.  Your abdomen is swollen.  You have nausea or vomiting.  You have a fever.  You have increasing abdominal pain that is not relieved with medicine. This information is not intended to replace advice given to you by your health care provider. Make sure you discuss any questions you have with your health care provider. Document Released: 09/17/2003 Document Revised: 10/28/2015 Document Reviewed: 04/16/2015 Elsevier Interactive Patient Education  2018 [employer] Anesthesia is a term that refers to techniques, procedures, and medicines that help a person stay safe and comfortable during a medical procedure.  Monitored anesthesia care, or sedation, is one type of anesthesia. Your anesthesia specialist may recommend sedation if you will be having a procedure that does not require you to be unconscious, such as:  Cataract surgery.  A dental procedure.  A biopsy.  A  colonoscopy.  During the procedure, you may receive a medicine to help you relax (sedative). There are three levels of sedation:  Mild sedation. At this level, you may feel awake and relaxed. You will be able to follow directions.  Moderate sedation. At this level, you will be sleepy. You may not remember the procedure.  Deep sedation. At this level, you will be asleep. You will not remember the procedure.  The more medicine you are given, the deeper your level of sedation will be. Depending on how you respond to the procedure, the anesthesia specialist may change your level of sedation or the type of anesthesia to fit your needs. An anesthesia specialist will monitor you closely during the procedure. Let your health care provider know about:  Any allergies you have.  All medicines you are taking, including vitamins, herbs, eye drops, creams, and over-the-counter medicines.  Any use of steroids (by mouth or as a cream).  Any problems you or family members have had with sedatives and anesthetic medicines.  Any blood disorders you have.  Any surgeries you have had.  Any medical conditions you have, such as sleep apnea.  Whether you are pregnant or may be pregnant.  Any use of cigarettes, alcohol, or street drugs. What are the risks? Generally, this is a safe procedure. However, problems may occur, including:  Getting too much medicine (oversedation).  Nausea.  Allergic reaction to medicines.  Trouble breathing. If this happens, a breathing tube may be used to help with breathing. It will be removed when you are awake and breathing on your own.  Heart trouble.  Lung trouble.  Before the procedure Staying hydrated Follow instructions from your health care provider about hydration, which may include:  Up to 2 hours before the procedure - you may continue to drink clear liquids, such as water, clear fruit juice, black coffee, and plain tea.  Eating and drinking  restrictions Follow instructions from your health care provider about eating and drinking, which may include:  8 hours before the procedure - stop eating heavy meals or foods such as meat, fried foods, or fatty foods.  6 hours before the procedure - stop eating light meals or foods, such as toast or cereal.  6 hours before the procedure - stop drinking milk or drinks that contain milk.  2 hours before the procedure - stop drinking clear liquids.  Medicines Ask your health care provider about:  Changing or stopping your regular medicines. This is especially important if you are taking diabetes medicines or blood thinners.  Taking medicines such as aspirin and ibuprofen. These medicines can thin your blood. Do not take these medicines before your procedure if your health care provider instructs you not to.  Tests and exams  You will have a physical exam.  You may have blood tests done to show: ? How well your kidneys and liver are working. ? How well your blood can clot.  General instructions  Plan to have someone take you home from the hospital or clinic.  If you will be going home right after the procedure, plan to have someone with you for 24 hours.  What happens during the procedure?  Your blood pressure, heart rate, breathing, level of  pain and overall condition will be monitored.  An IV tube will be inserted into one of your veins.  Your anesthesia specialist will give you medicines as needed to keep you comfortable during the procedure. This may mean changing the level of sedation.  The procedure will be performed. After the procedure  Your blood pressure, heart rate, breathing rate, and blood oxygen level will be monitored until the medicines you were given have worn off.  Do not drive for 24 hours if you received a sedative.  You may: ? Feel sleepy, clumsy, or nauseous. ? Feel forgetful about what happened after the procedure. ? Have a sore throat if you had a  breathing tube during the procedure. ? Vomit. This information is not intended to replace advice given to you by your health care provider. Make sure you discuss any questions you have with your health care provider. Document Released: 10/29/2004 Document Revised: 07/12/2015 Document Reviewed: 05/26/2015 Elsevier Interactive Patient Education  2018 [place], Care After These instructions provide you with information about caring for yourself after your procedure. Your health care provider may also give you more specific instructions. Your treatment has been planned according to current medical practices, but problems sometimes occur. Call your health care provider if you have any problems or questions after your procedure. What can I expect after the procedure? After your procedure, it is common to:  Feel sleepy for several hours.  Feel clumsy and have poor balance for several hours.  Feel forgetful about what happened after the procedure.  Have poor judgment for several hours.  Feel nauseous or vomit.  Have a sore throat if you had a breathing tube during the procedure.  Follow these instructions at home: For at least 24 hours after the procedure:   Do not: ? Participate in activities in which you could fall or become injured. ? Drive. ? Use heavy machinery. ? Drink alcohol. ? Take sleeping pills or medicines that cause drowsiness. ? Make important decisions or sign legal documents. ? Take care of children on your own.  Rest. Eating and drinking  Follow the diet that is recommended by your health care provider.  If you vomit, drink water, juice, or soup when you can drink without vomiting.  Make sure you have little or no nausea before eating solid foods. General instructions  Have a responsible adult stay with you until you are awake and alert.  Take over-the-counter and prescription medicines only as told by your health care  provider.  If you smoke, do not smoke without supervision.  Keep all follow-up visits as told by your health care provider. This is important. Contact a health care provider if:  You keep feeling nauseous or you keep vomiting.  You feel light-headed.  You develop a rash.  You have a fever. Get help right away if:  You have trouble breathing. This information is not intended to replace advice given to you by your health care provider. Make sure you discuss any questions you have with your health care provider. Document Released: 05/26/2015 Document Revised: 09/25/2015 Document Reviewed: 05/26/2015 Elsevier Interactive Patient Education  [employer].

## 2017-03-03 ENCOUNTER — Encounter (HOSPITAL_COMMUNITY)
Admission: RE | Admit: 2017-03-03 | Discharge: 2017-03-03 | Disposition: A | Discharge status: Home / Self Care | Payer: Medicare Other | Source: Ambulatory Visit | Attending: Gastroenterology | Admitting: Gastroenterology

## 2017-03-03 ENCOUNTER — Encounter (HOSPITAL_COMMUNITY): Payer: Self-pay

## 2017-03-03 ENCOUNTER — Other Ambulatory Visit: Payer: Self-pay

## 2017-03-03 DIAGNOSIS — L03119 Cellulitis of unspecified part of limb: Secondary | ICD-10-CM | POA: Diagnosis not present

## 2017-03-03 DIAGNOSIS — I5033 Acute on chronic diastolic (congestive) heart failure: Secondary | ICD-10-CM | POA: Diagnosis not present

## 2017-03-03 DIAGNOSIS — I2510 Atherosclerotic heart disease of native coronary artery without angina pectoris: Secondary | ICD-10-CM | POA: Diagnosis not present

## 2017-03-03 DIAGNOSIS — G4733 Obstructive sleep apnea (adult) (pediatric): Secondary | ICD-10-CM | POA: Insufficient documentation

## 2017-03-03 DIAGNOSIS — I130 Hypertensive heart and chronic kidney disease with heart failure and stage 1 through stage 4 chronic kidney disease, or unspecified chronic kidney disease: Secondary | ICD-10-CM | POA: Diagnosis not present

## 2017-03-03 DIAGNOSIS — L02619 Cutaneous abscess of unspecified foot: Secondary | ICD-10-CM | POA: Diagnosis not present

## 2017-03-03 DIAGNOSIS — Z01812 Encounter for preprocedural laboratory examination: Secondary | ICD-10-CM | POA: Diagnosis not present

## 2017-03-03 DIAGNOSIS — K219 Gastro-esophageal reflux disease without esophagitis: Secondary | ICD-10-CM | POA: Diagnosis not present

## 2017-03-03 DIAGNOSIS — N183 Chronic kidney disease, stage 3 (moderate): Secondary | ICD-10-CM | POA: Diagnosis not present

## 2017-03-03 DIAGNOSIS — E1122 Type 2 diabetes mellitus with diabetic chronic kidney disease: Secondary | ICD-10-CM | POA: Insufficient documentation

## 2017-03-03 DIAGNOSIS — N179 Acute kidney failure, unspecified: Secondary | ICD-10-CM | POA: Insufficient documentation

## 2017-03-03 DIAGNOSIS — F250 Schizoaffective disorder, bipolar type: Secondary | ICD-10-CM | POA: Diagnosis not present

## 2017-03-03 DIAGNOSIS — E118 Type 2 diabetes mellitus with unspecified complications: Secondary | ICD-10-CM | POA: Insufficient documentation

## 2017-03-03 DIAGNOSIS — N289 Disorder of kidney and ureter, unspecified: Secondary | ICD-10-CM | POA: Insufficient documentation

## 2017-03-03 DIAGNOSIS — R1310 Dysphagia, unspecified: Secondary | ICD-10-CM | POA: Insufficient documentation

## 2017-03-03 HISTORY — DX: Pure hypercholesterolemia, unspecified: E78.00

## 2017-03-03 HISTORY — DX: Personal history of urinary calculi: Z87.442

## 2017-03-03 HISTORY — DX: Gastro-esophageal reflux disease without esophagitis: K21.9

## 2017-03-03 LAB — CBC WITH DIFFERENTIAL/PLATELET
Basophils Absolute: 0 10*3/uL (ref 0.0–0.1)
Basophils Relative: 0 %
Eosinophils Absolute: 0.3 10*3/uL (ref 0.0–0.7)
Eosinophils Relative: 3 %
HCT: 45.2 % (ref 39.0–52.0)
Hemoglobin: 15.1 g/dL (ref 13.0–17.0)
Lymphocytes Relative: 24 %
Lymphs Abs: 2.4 10*3/uL (ref 0.7–4.0)
MCH: 28.5 pg (ref 26.0–34.0)
MCHC: 33.4 g/dL (ref 30.0–36.0)
MCV: 85.3 fL (ref 78.0–100.0)
Monocytes Absolute: 0.7 10*3/uL (ref 0.1–1.0)
Monocytes Relative: 7 %
Neutro Abs: 6.8 10*3/uL (ref 1.7–7.7)
Neutrophils Relative %: 66 %
Platelets: 136 10*3/uL — ABNORMAL LOW (ref 150–400)
RBC: 5.3 MIL/uL (ref 4.22–5.81)
RDW: 13.9 % (ref 11.5–15.5)
WBC: 10.2 10*3/uL (ref 4.0–10.5)

## 2017-03-03 LAB — BASIC METABOLIC PANEL
Anion gap: 10 (ref 5–15)
BUN: 18 mg/dL (ref 6–20)
CALCIUM: 9.1 mg/dL (ref 8.9–10.3)
CHLORIDE: 101 mmol/L (ref 101–111)
CO2: 27 mmol/L (ref 22–32)
CREATININE: 1.27 mg/dL — AB (ref 0.61–1.24)
GFR, EST NON AFRICAN AMERICAN: 57 mL/min — AB (ref >60)
Glucose, Bld: 223 mg/dL — ABNORMAL HIGH (ref 65–99)
Potassium: 3.9 mmol/L (ref 3.5–5.1)
SODIUM: 138 mmol/L (ref 135–145)

## 2017-03-03 NOTE — Progress Notes (Signed)
Called, many rings and no answer.

## 2017-03-04 NOTE — Progress Notes (Signed)
PT's POA [name] is aware.

## 2017-03-09 ENCOUNTER — Other Ambulatory Visit (HOSPITAL_COMMUNITY): Payer: Self-pay | Admitting: Adult Health

## 2017-03-09 ENCOUNTER — Telehealth: Payer: Self-pay | Admitting: Gastroenterology

## 2017-03-09 ENCOUNTER — Other Ambulatory Visit: Payer: Self-pay

## 2017-03-09 ENCOUNTER — Encounter (HOSPITAL_COMMUNITY)
Admission: RE | Disposition: A | Discharge status: Home / Self Care | Payer: Self-pay | Source: Ambulatory Visit | Attending: Gastroenterology

## 2017-03-09 ENCOUNTER — Ambulatory Visit (HOSPITAL_COMMUNITY): Payer: Medicare Other | Admitting: Anesthesiology

## 2017-03-09 ENCOUNTER — Telehealth (HOSPITAL_COMMUNITY): Payer: Self-pay | Admitting: Adult Health

## 2017-03-09 ENCOUNTER — Ambulatory Visit (HOSPITAL_COMMUNITY)
Admission: RE | Admit: 2017-03-09 | Discharge: 2017-03-09 | Disposition: A | Discharge status: Home / Self Care | Payer: Medicare Other | Source: Ambulatory Visit | Attending: Gastroenterology | Admitting: Gastroenterology

## 2017-03-09 ENCOUNTER — Encounter (HOSPITAL_COMMUNITY): Payer: Self-pay | Admitting: Gastroenterology

## 2017-03-09 DIAGNOSIS — R011 Cardiac murmur, unspecified: Secondary | ICD-10-CM | POA: Insufficient documentation

## 2017-03-09 DIAGNOSIS — D490 Neoplasm of unspecified behavior of digestive system: Secondary | ICD-10-CM

## 2017-03-09 DIAGNOSIS — I252 Old myocardial infarction: Secondary | ICD-10-CM | POA: Diagnosis not present

## 2017-03-09 DIAGNOSIS — K644 Residual hemorrhoidal skin tags: Secondary | ICD-10-CM | POA: Diagnosis not present

## 2017-03-09 DIAGNOSIS — Z1211 Encounter for screening for malignant neoplasm of colon: Secondary | ICD-10-CM

## 2017-03-09 DIAGNOSIS — C155 Malignant neoplasm of lower third of esophagus: Secondary | ICD-10-CM | POA: Insufficient documentation

## 2017-03-09 DIAGNOSIS — K449 Diaphragmatic hernia without obstruction or gangrene: Secondary | ICD-10-CM | POA: Insufficient documentation

## 2017-03-09 DIAGNOSIS — E7800 Pure hypercholesterolemia, unspecified: Secondary | ICD-10-CM | POA: Diagnosis not present

## 2017-03-09 DIAGNOSIS — E119 Type 2 diabetes mellitus without complications: Secondary | ICD-10-CM | POA: Insufficient documentation

## 2017-03-09 DIAGNOSIS — K2970 Gastritis, unspecified, without bleeding: Secondary | ICD-10-CM | POA: Diagnosis not present

## 2017-03-09 DIAGNOSIS — Q2733 Arteriovenous malformation of digestive system vessel: Secondary | ICD-10-CM | POA: Insufficient documentation

## 2017-03-09 DIAGNOSIS — K2289 Other specified disease of esophagus: Secondary | ICD-10-CM

## 2017-03-09 DIAGNOSIS — Q438 Other specified congenital malformations of intestine: Secondary | ICD-10-CM | POA: Insufficient documentation

## 2017-03-09 DIAGNOSIS — F250 Schizoaffective disorder, bipolar type: Secondary | ICD-10-CM | POA: Insufficient documentation

## 2017-03-09 DIAGNOSIS — C161 Malignant neoplasm of fundus of stomach: Secondary | ICD-10-CM

## 2017-03-09 DIAGNOSIS — D120 Benign neoplasm of cecum: Secondary | ICD-10-CM | POA: Insufficient documentation

## 2017-03-09 DIAGNOSIS — D123 Benign neoplasm of transverse colon: Secondary | ICD-10-CM | POA: Diagnosis not present

## 2017-03-09 DIAGNOSIS — K6389 Other specified diseases of intestine: Secondary | ICD-10-CM

## 2017-03-09 DIAGNOSIS — K219 Gastro-esophageal reflux disease without esophagitis: Secondary | ICD-10-CM

## 2017-03-09 DIAGNOSIS — Z885 Allergy status to narcotic agent status: Secondary | ICD-10-CM | POA: Insufficient documentation

## 2017-03-09 DIAGNOSIS — K317 Polyp of stomach and duodenum: Secondary | ICD-10-CM

## 2017-03-09 DIAGNOSIS — Z7982 Long term (current) use of aspirin: Secondary | ICD-10-CM | POA: Insufficient documentation

## 2017-03-09 DIAGNOSIS — Z9049 Acquired absence of other specified parts of digestive tract: Secondary | ICD-10-CM | POA: Insufficient documentation

## 2017-03-09 DIAGNOSIS — M1990 Unspecified osteoarthritis, unspecified site: Secondary | ICD-10-CM | POA: Insufficient documentation

## 2017-03-09 DIAGNOSIS — I2510 Atherosclerotic heart disease of native coronary artery without angina pectoris: Secondary | ICD-10-CM | POA: Insufficient documentation

## 2017-03-09 DIAGNOSIS — Z1212 Encounter for screening for malignant neoplasm of rectum: Secondary | ICD-10-CM

## 2017-03-09 DIAGNOSIS — G4733 Obstructive sleep apnea (adult) (pediatric): Secondary | ICD-10-CM | POA: Insufficient documentation

## 2017-03-09 DIAGNOSIS — R1310 Dysphagia, unspecified: Secondary | ICD-10-CM | POA: Insufficient documentation

## 2017-03-09 DIAGNOSIS — Z8249 Family history of ischemic heart disease and other diseases of the circulatory system: Secondary | ICD-10-CM | POA: Insufficient documentation

## 2017-03-09 DIAGNOSIS — D125 Benign neoplasm of sigmoid colon: Secondary | ICD-10-CM | POA: Diagnosis not present

## 2017-03-09 DIAGNOSIS — C160 Malignant neoplasm of cardia: Secondary | ICD-10-CM | POA: Insufficient documentation

## 2017-03-09 DIAGNOSIS — I5042 Chronic combined systolic (congestive) and diastolic (congestive) heart failure: Secondary | ICD-10-CM | POA: Insufficient documentation

## 2017-03-09 DIAGNOSIS — I110 Hypertensive heart disease with heart failure: Secondary | ICD-10-CM | POA: Diagnosis not present

## 2017-03-09 DIAGNOSIS — K228 Other specified diseases of esophagus: Secondary | ICD-10-CM

## 2017-03-09 DIAGNOSIS — Z87442 Personal history of urinary calculi: Secondary | ICD-10-CM | POA: Diagnosis not present

## 2017-03-09 DIAGNOSIS — C169 Malignant neoplasm of stomach, unspecified: Secondary | ICD-10-CM | POA: Diagnosis not present

## 2017-03-09 DIAGNOSIS — Z79899 Other long term (current) drug therapy: Secondary | ICD-10-CM | POA: Insufficient documentation

## 2017-03-09 DIAGNOSIS — C159 Malignant neoplasm of esophagus, unspecified: Secondary | ICD-10-CM | POA: Diagnosis not present

## 2017-03-09 DIAGNOSIS — Z820 Family history of epilepsy and other diseases of the nervous system: Secondary | ICD-10-CM | POA: Insufficient documentation

## 2017-03-09 HISTORY — PX: ESOPHAGOGASTRODUODENOSCOPY (EGD) WITH PROPOFOL: SHX5813

## 2017-03-09 HISTORY — PX: COLONOSCOPY WITH PROPOFOL: SHX5780

## 2017-03-09 HISTORY — PX: BIOPSY: SHX5522

## 2017-03-09 HISTORY — PX: SAVORY DILATION: SHX5439

## 2017-03-09 HISTORY — PX: POLYPECTOMY: SHX5525

## 2017-03-09 LAB — GLUCOSE, CAPILLARY
GLUCOSE-CAPILLARY: 164 mg/dL — AB (ref 65–99)
Glucose-Capillary: 149 mg/dL — ABNORMAL HIGH (ref 65–99)

## 2017-03-09 SURGERY — COLONOSCOPY WITH PROPOFOL
Anesthesia: Monitor Anesthesia Care

## 2017-03-09 MED ORDER — CHLORHEXIDINE GLUCONATE CLOTH 2 % EX PADS: 6.0000 | MEDICATED_PAD | Freq: Once | CUTANEOUS | Status: DC

## 2017-03-09 MED ORDER — GLYCOPYRROLATE 0.2 MG/ML IJ SOLN
0.2000 mg | Freq: Once | INTRAMUSCULAR | Status: AC | PRN
  Administered 2017-03-09: 0.2 mg via INTRAVENOUS
  Filled 2017-03-09: qty 1

## 2017-03-09 MED ORDER — PROPOFOL 10 MG/ML IV BOLUS
INTRAVENOUS | Status: AC
  Filled 2017-03-09: qty 80

## 2017-03-09 MED ORDER — ONDANSETRON HCL 4 MG/2ML IJ SOLN
INTRAMUSCULAR | Status: AC
  Filled 2017-03-09: qty 2

## 2017-03-09 MED ORDER — PROPOFOL 10 MG/ML IV BOLUS
INTRAVENOUS | Status: DC | PRN
  Administered 2017-03-09: 40 mg via INTRAVENOUS

## 2017-03-09 MED ORDER — ONDANSETRON HCL 4 MG/2ML IJ SOLN
4.0000 mg | Freq: Once | INTRAMUSCULAR | Status: AC
  Administered 2017-03-09: 4 mg via INTRAVENOUS

## 2017-03-09 MED ORDER — LIDOCAINE VISCOUS 2 % MT SOLN
OROMUCOSAL | Status: AC
Start: 2017-03-09 — End: ?
  Filled 2017-03-09: qty 15

## 2017-03-09 MED ORDER — FENTANYL CITRATE (PF) 100 MCG/2ML IJ SOLN
INTRAMUSCULAR | Status: AC
  Filled 2017-03-09: qty 2

## 2017-03-09 MED ORDER — FENTANYL CITRATE (PF) 100 MCG/2ML IJ SOLN
25.0000 ug | Freq: Once | INTRAMUSCULAR | Status: AC
  Administered 2017-03-09: 25 ug via INTRAVENOUS

## 2017-03-09 MED ORDER — MINERAL OIL PO OIL
TOPICAL_OIL | ORAL | Status: AC
  Filled 2017-03-09: qty 30

## 2017-03-09 MED ORDER — PROPOFOL 500 MG/50ML IV EMUL
INTRAVENOUS | Status: DC | PRN
  Administered 2017-03-09: 150 ug/kg/min via INTRAVENOUS

## 2017-03-09 MED ORDER — MIDAZOLAM HCL 2 MG/2ML IJ SOLN
1.0000 mg | INTRAMUSCULAR | Status: AC
  Administered 2017-03-09: 2 mg via INTRAVENOUS

## 2017-03-09 MED ORDER — LACTATED RINGERS IV SOLN
INTRAVENOUS | Status: DC
  Administered 2017-03-09: 12:00:00 via INTRAVENOUS

## 2017-03-09 MED ORDER — LIDOCAINE VISCOUS 2 % MT SOLN
6.0000 mL | Freq: Once | OROMUCOSAL | Status: AC
  Administered 2017-03-09: 6 mL via OROMUCOSAL

## 2017-03-09 MED ORDER — MIDAZOLAM HCL 2 MG/2ML IJ SOLN
INTRAMUSCULAR | Status: AC
  Filled 2017-03-09: qty 2

## 2017-03-09 MED ORDER — PHENYLEPHRINE HCL 10 MG/ML IJ SOLN
INTRAMUSCULAR | Status: DC | PRN
  Administered 2017-03-09 (×5): 80 ug via INTRAVENOUS

## 2017-03-09 NOTE — Telephone Encounter (Signed)
CT chest, CT abd/pelvis scheduled for 03/12/17 at 12:00pm, pt to arrive at 11:45am. Pick up contrast before day of test. NPO for 4 hours before test. Called [facility] and informed nurse Mateo Flow) to let pt know about CT appt. Letter also mailed.  Oncology referral sent via Epic.

## 2017-03-09 NOTE — Discharge Instructions (Signed)
YOUR DIFFICULTY SWALLOWING IS DUE TO A MASS IN YOUR ESOPHAGUS. You had 6 polyps removed. You have small external  hemorrhoids. You havegastritis, a few benign appearing STOMACH POLYPS, & a HIATAL HERNIA. I biopsied your stomach, & esophagus.    THE MASS IN YOUR ESOPHAGUS WILL REQUIRE TREATMENT.  YOU SHOULD SEE ONCOLOGY AND COMPLETE A CT SCAN OF THE CHEST, ABDOMEN, AND PELVIS WITHIN THE NEXT 1-2 WEEKS.  FOLLOW A SOFT MECHANICAL DIET.  MEATS SHOULD BE GROUND ONLY. ALL FOOD SOUL BE SOFT LIKE MASHED POTATOES. YOU SHOULD NOT EAT BREAD.  ADD BOOST, ENSURE, OR CARNATION INSTANT BREAKFAST WITH MILK OR A NONDAIRY ALTERNATIVE THREE 3 CANS DAILY.   YOUR BIOPSY RESULTS WILL BE AVAILABLE BY Friday JAN 25.   FOLLOW UP IN 6 MOS.   Next colonoscopy in 3 years.   ENDOSCOPY Care After Read the instructions outlined below and refer to this sheet in the next week. These discharge instructions provide you with general information on caring for yourself after you leave the hospital. While your treatment has been planned according to the most current medical practices available, unavoidable complications occasionally occur. If you have any problems or questions after discharge, call DR. Tamaka Sawin, [phone].  ACTIVITY  You may resume your regular activity, but move at a slower pace for the next 24 hours.   Take frequent rest periods for the next 24 hours.   Walking will help get rid of the air and reduce the bloated feeling in your belly (abdomen).   No driving for 24 hours (because of the medicine (anesthesia) used during the test).   You may shower.   Do not sign any important legal documents or operate any machinery for 24 hours (because of the anesthesia used during the test).    NUTRITION  Drink plenty of fluids.   You may resume your normal diet as instructed by your doctor.   Begin with a light meal and progress to your normal diet. Heavy or fried foods are harder to digest and may make you  feel sick to your stomach (nauseated).   Avoid alcoholic beverages for 24 hours or as instructed.    MEDICATIONS  You may resume your normal medications.   WHAT YOU CAN EXPECT TODAY  Some feelings of bloating in the abdomen.   Passage of more gas than usual.   Spotting of blood in your stool or on the toilet paper  .  IF YOU HAD POLYPS REMOVED DURING THE ENDOSCOPY:  Eat a soft diet IF YOU HAVE NAUSEA, BLOATING, ABDOMINAL PAIN, OR VOMITING.    FINDING OUT THE RESULTS OF YOUR TEST Not all test results are available during your visit. DR. Oneida Alar WILL CALL YOU WITHIN 7 DAYS OF YOUR PROCEDUE WITH YOUR RESULTS. Do not assume everything is normal if you have not heard from DR. Leane Loring IN ONE WEEK, CALL HER OFFICE AT [phone].  SEEK IMMEDIATE MEDICAL ATTENTION AND CALL THE OFFICE: [phone] IF:  You have more than a spotting of blood in your stool.   Your belly is swollen (abdominal distention).   You are nauseated or vomiting.   You have a temperature over 101F.   You have abdominal pain or discomfort that is severe or gets worse throughout the day.   SOFT MECHANICAL DIET This SOFT MECHANICAL DIET is restricted to:  Foods that are moist, soft-textured, and easy to chew and swallow.   Meats that are ground.   Foods that do not include bread or bread-like textures except  soft pancakes, well-moistened with syrup or sauce.   Textures with some chewing ability required.   Casseroles without rice.   Cooked vegetables that are less than half an inch in size and easily mashed with a fork. No cooked corn, peas, broccoli, cauliflower, cabbage, Brussels sprouts, asparagus, or other fibrous, non-tender or rubbery cooked vegetables.   Canned fruit except for pineapple. Fruit must be cut into pieces no larger than half an inch in size.   Foods that do not include nuts, seeds, coconut, or sticky textures.   FOOD TEXTURES FOR DYSPHAGIA DIET LEVEL 2 -SOFT MECHANICAL  DIET (includes all foods on Dysphagia Diet Level 1 - Pureed, in addition to the foods listed below)  FOOD GROUP: Breads. RECOMMENDED: Soft pancakes, well-moistened with syrup or sauce.  AVOID: All others.  FOOD GROUP: Cereals.  RECOMMENDED: Cooked cereals with little texture, including oatmeal. Unprocessed wheat bran stirred into cereals for bulk. Note: If thin liquids are restricted, it is important that all of the liquid is absorbed into the cereal.  AVOID: All dry cereals and any cooked cereals that may contain flax seeds or other seeds or nuts. Whole-grain, dry, or coarse cereals. Cereals with nuts, seeds, dried fruit, and/or coconut.  FOOD GROUP: Desserts. RECOMMENDED: Pudding, custard. Soft fruit pies with bottom crust only. Canned fruit (excluding pineapple). Soft, moist cakes with icing.Frozen malts, milk shakes, frozen yogurt, eggnog, nutritional supplements, ice cream, sherbet, regular or sugar-free gelatin, or any foods that become thin liquid at either room (70 F) or body temperature (98 F).  AVOID: Dry, coarse cakes and cookies. Anything with nuts, seeds, coconut, pineapple, or dried fruit. Breakfast yogurt with nuts. Rice or bread pudding.  FOOD GROUP: Fats. RECOMMENDED: Butter, margarine, cream for cereal (depending on liquid consistency recommendations), gravy, cream sauces, sour cream, sour cream dips with soft additives, mayonnaise, salad dressings, cream cheese, cream cheese spreads with soft additives, whipped toppings.  AVOID: All fats with coarse or chunky additives.  FOOD GROUP: Fruits. RECOMMENDED: Soft drained, canned, or cooked fruits without seeds or skin. Fresh soft and ripe banana. Fruit juices with a small amount of pulp. If thin liquids are restricted, fruit juices should be thickened to appropriate consistency.  AVOID: Fresh or frozen fruits. Cooked fruit with skin or seeds. Dried fruits. Fresh, canned, or cooked pineapple.  FOOD GROUP: Meats and Meat  Substitutes. (Meat pieces should not exceed 1/4 of an inch cube and should be tender.) RECOMMENDED: Moistened ground or cooked meat, poultry, or fish. Moist ground or tender meat may be served with gravy or sauce. Casseroles without rice. Moist macaroni and cheese, well-cooked pasta with meat sauce, tuna noodle casserole, soft, moist lasagna. Moist meatballs, meatloaf, or fish loaf. Protein salads, such as tuna or egg without large chunks, celery, or onion. Cottage cheese, smooth quiche without large chunks. Poached, scrambled, or soft-cooked eggs (egg yolks should not be runny but should be moist and able to be mashed with butter, margarine, or other moisture added to them). (Cook eggs to 160 F or use pasteurized eggs for safety.) Souffls may have small, soft chunks. Tofu. Well-cooked, slightly mashed, moist legumes, such as baked beans. All meats or protein substitutes should be served with sauces or moistened to help maintain cohesiveness in the oral cavity.  AVOID: Dry meats, tough meats (such as bacon, sausage, hot dogs, bratwurst). Dry casseroles or casseroles with rice or large chunks. Peanut butter. Cheese slices and cubes. Hard-cooked or crisp fried eggs. Sandwiches.Pizza.  FOOD GROUP: Potatoes and Starches. RECOMMENDED:  Well-cooked, moistened, boiled, baked, or mashed potatoes. Well-cooked shredded hash brown potatoes that are not crisp. (All potatoes need to be moist and in sauces.)Well-cooked noodles in sauce. Spaetzel or soft dumplings that have been moistened with butter or gravy.  AVOID: Potato skins and chips. Fried or French-fried potatoes. Rice.  FOOD GROUP: Soups. RECOMMENDED: Soups with easy-to-chew or easy-to-swallow meats or vegetables: Particle sizes in soups should be less than 1/2 inch. Soups will need to be thickened to appropriate consistency if soup is thinner than prescribed liquid consistency.  AVOID: Soups with large chunks of meat and vegetables. Soups with rice,  corn, peas.  FOOD GROUP: Vegetables. RECOMMENDED: All soft, well-cooked vegetables. Vegetables should be less than a half inch. Should be easily mashed with a fork.  AVOID: Cooked corn and peas. Broccoli, cabbage, Brussels sprouts, asparagus, or other fibrous, non-tender or rubbery cooked vegetables.  FOOD GROUP: Miscellaneous. RECOMMENDED: Jams and preserves without seeds, jelly. Sauces, salsas, etc., that may have small tender chunks less than 1/2 inch. Soft, smooth chocolate bars that are easily chewed.  AVOID: Seeds, nuts, coconut, or sticky foods. Chewy candies such as caramels or licorice.

## 2017-03-09 NOTE — Telephone Encounter (Signed)
NEEDS CT THIS WEEK AND ONCOLOGY REFERRAL FOR ESOPHAGEAL MASS.

## 2017-03-09 NOTE — Op Note (Signed)
[facility] Patient Name: [name] Procedure Date: 03/09/2017 12:48 PM MRN: [id number] Date of Birth: Jun 11, 1950 Attending MD: Barney Drain MD, MD CSN: [id number] Age: 67 Admit Type: Outpatient Procedure:                Upper GI endoscopy WITH COLD FORCEPS BIOPSY Indications:              Dysphagia Providers:                Barney Drain MD, MD, Gwenlyn Fudge RN, RN, Aram Candela Referring MD:             Jasper Loser. Luan Pulling MD Medicines:                Propofol per Anesthesia Complications:            No immediate complications. Estimated Blood Loss:     Estimated blood loss was minimal. Procedure:                Pre-Anesthesia Assessment:                           - Prior to the procedure, a History and Physical                            was performed, and patient medications and                            allergies were reviewed. The patient's tolerance of                            previous anesthesia was also reviewed. The risks                            and benefits of the procedure and the sedation                            options and risks were discussed with the patient.                            All questions were answered, and informed consent                            was obtained. Prior Anticoagulants: The patient has                            taken aspirin, last dose was more than 4 weeks                            prior to procedure. ASA Grade Assessment: II - A                            patient with mild systemic disease. After reviewing  the risks and benefits, the patient was deemed in                            satisfactory condition to undergo the procedure.                            After obtaining informed consent, the endoscope was                            passed under direct vision. Throughout the                            procedure, the patient's blood pressure, pulse, and             oxygen saturations were monitored continuously. The                            EG29-iL0 ([id number]) scope was introduced through the                            mouth, and advanced to the second part of duodenum.                            The upper GI endoscopy was accomplished without                            difficulty. The patient tolerated the procedure                            well. Scope In: 12:52:17 PM Scope Out: 1:04:21 PM Total Procedure Duration: 0 hours 12 minutes 4 seconds  Findings:      A medium-sized, fungating mass with bleeding and stigmata of recent       bleeding was found in the lower third of the esophagus and at the       gastroesophageal junction, 30 to 35 cm from the incisors. The mass was       partially obstructing and circumferential. This was biopsied with a cold       forceps for histology.      A small hiatal hernia was present.      A medium-sized, polypoid, non-circumferential mass with no bleeding and       no stigmata of recent bleeding was found in the gastric fundus. Biopsies       were taken with a cold forceps for histology.      Diffuse mild inflammation characterized by congestion (edema) and       erythema was found in the gastric antrum. Biopsies were taken with a       cold forceps for Helicobacter pylori testing.      Multiple small sessile polyps with no stigmata of recent bleeding were       found in the gastric body. The polyp was removed with a cold biopsy       forceps. Resection and retrieval were complete.      The examined duodenum was normal. 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                           - Small hiatal hernia.                           - Malignant gastric tumor/SINGLE SATELLITE LESION                            in the gastric fundus.  Biopsied.                           - MILD GASTRITIS Biopsied.                           - Multiple benign appearing fundic gland polyps. Moderate Sedation:      Per Anesthesia Care Recommendation:           - Await pathology results. DISCUSSED WITH ONCOLOGY.                            OBTAINS NPV AND CT C/A/P W/ IV AND ORAL CONTRAST.                           - Continue present medications.                           - Soft diet.                           - Return to my office in 6 months.                           - Patient has a contact number available for                            emergencies. The signs and symptoms of potential                            delayed complications were discussed with the                            patient. Return to normal activities tomorrow.                            Written discharge instructions were provided to the                            patient.                           - Refer to an oncologist at the next available  appointment.                           - Patient has a contact number available for                            emergencies. The signs and symptoms of potential                            delayed complications were discussed with the                            patient. Return to normal activities tomorrow.                            Written discharge instructions were provided to the                            patient. Procedure Code(s):        --- Professional ---                           [phone], Esophagogastroduodenoscopy, flexible,                            transoral; with biopsy, single or multiple Diagnosis Code(s):        --- Professional ---                           C15.5, Malignant neoplasm of lower third of                            esophagus                           C16.0, Malignant neoplasm of cardia                           K44.9, Diaphragmatic hernia without obstruction or                             gangrene                           C16.1, Malignant neoplasm of fundus of stomach                           K29.70, Gastritis, unspecified, without bleeding                           K31.7, Polyp of stomach and duodenum                           R13.10, Dysphagia, unspecified CPT copyright 2016 American Medical Association. All rights reserved. The codes documented in this report are preliminary and upon coder review may  be revised to meet current compliance requirements. Barney Drain, MD Barney Drain MD, MD 03/09/2017 1:31:19  PM This report has been signed electronically. Number of Addenda: 0

## 2017-03-09 NOTE — Telephone Encounter (Signed)
Received call from Dr. Oneida Alar with GI re: [name].  He recently underwent GI procedure today for c/o dysphagia.  Per Dr. 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  Pathology is currently pending, but clinically is very suspicious for malignancy.  Dr. Oneida Alar requesting our recommendations for additional work-up.    Recommended staging imaging with CT chest/abd/pelvis with contrast; Dr. Oneida Alar to order the scans.  Will also see if they can add-on HER2 testing to specimen collected today.    I will ask our scheduling team here at the cancer center to bring him in for new patient visit next week for formal consultation.     Orders placed for labs before visit at cancer center: CBC with diff, CMET, and CEA.     Zachary Craze, NP [place] [phone]

## 2017-03-09 NOTE — Op Note (Signed)
[facility] Patient Name: [name] Procedure Date: 03/09/2017 11:45 AM MRN: [id number] Date of Birth: Oct 10, 1950 Attending MD: Barney Drain MD, MD CSN: [id number] Age: 67 Admit Type: Outpatient Procedure:                Colonoscopy WITH COLD SNARE/SNARE POLYPECTOMY &                            CLIP x1 Indications:              Screening for colorectal malignant neoplasm Providers:                Barney Drain MD, MD, Otis Peak B. Sharon Seller, RN, Aram Candela Referring MD:             Jasper Loser. Luan Pulling MD, MD Medicines:                Propofol per Anesthesia Complications:            No immediate complications. Estimated Blood Loss:     Estimated blood loss was minimal. Procedure:                Pre-Anesthesia Assessment:                           - Prior to the procedure, a History and Physical                            was performed, and patient medications and                            allergies were reviewed. The patient's tolerance of                            previous anesthesia was also reviewed. The risks                            and benefits of the procedure and the sedation                            options and risks were discussed with the patient.                            All questions were answered, and informed consent                            was obtained. Prior Anticoagulants: The patient has                            taken aspirin, last dose was more than 4 weeks                            prior to procedure. ASA Grade Assessment: II - A  patient with mild systemic disease. After reviewing                            the risks and benefits, the patient was deemed in                            satisfactory condition to undergo the procedure.                            After obtaining informed consent, the colonoscope                            was passed under direct vision. Throughout the                procedure, the patient's blood pressure, pulse, and                            oxygen saturations were monitored continuously. The                            EC38-i10L [id number]) scope was introduced through                            the anus and advanced to the the cecum, identified                            by appendiceal orifice and ileocecal valve. The                            colonoscopy was technically difficult and complex                            due to significant looping. Successful completion                            of the procedure was aided by straightening and                            shortening the scope to obtain bowel loop reduction                            and COLOWRAP. The patient tolerated the procedure                            well. The quality of the bowel preparation was                            good. The ileocecal valve, appendiceal orifice, and                            rectum were photographed. Scope In: 12:18:57 PM Scope Out: 12:46:28 PM Scope Withdrawal Time: 0 hours 22 minutes 55 seconds  Total Procedure Duration: 0 hours 27 minutes 31 seconds  Findings:  A 10 mm polyp was found in the cecum. The polyp was pedunculated. The       polyp was removed with a hot snare. Resection and retrieval were       complete. To prevent bleeding after the polypectomy, one hemostatic clip       was successfully placed (MR conditional). There was no bleeding at the       end of the procedure.      Two sessile polyps were found in the proximal transverse colon and       cecum. The polyps were 3 to 4 mm in size. These polyps were removed with       a cold snare. Resection and retrieval were complete.      Three sessile polyps were found in the splenic flexure, mid transverse       colon and distal transverse colon. The polyps were 5 to 8 mm in size.       These polyps were removed with a hot snare. Resection and retrieval were       complete.       The recto-sigmoid colon, sigmoid colon and descending colon were       moderately redundant.      External hemorrhoids were found during retroflexion. The hemorrhoids       were moderate. Impression:               - One 10 mm polyp in the cecum, removed with a hot                            snare. Resected and retrieved. Clip (MR                            conditional) was placed.                           - Two 3 to 4 mm polyps in the proximal transverse                            colon and in the cecum, removed with a cold snare.                            Resected and retrieved.                           - Three 5 to 8 mm polyps at the splenic flexure, in                            the mid transverse colon and in the distal                            transverse colon, removed with a hot snare.                            Resected and retrieved.                           - Redundant LEFT colon.                           -  External hemorrhoids. Moderate Sedation:      Per Anesthesia Care Recommendation:           - Repeat colonoscopy in 3 years for surveillance.                            CLIP A PLACED IN COLON. MRI TECH SHOULD BE MADE                            AWARE PRIOR TO IMAGING.                           - High fiber diet.                           - Continue present medications.                           - Await pathology results.                           - Patient has a contact number available for                            emergencies. The signs and symptoms of potential                            delayed complications were discussed with the                            patient. Return to normal activities tomorrow.                            Written discharge instructions were provided to the                            patient. Procedure Code(s):        --- Professional ---                           [phone], Colonoscopy, flexible; with removal of                             tumor(s), polyp(s), or other lesion(s) by snare                            technique Diagnosis Code(s):        --- Professional ---                           Z12.11, Encounter for screening for malignant                            neoplasm of colon                           D12.0, Benign neoplasm of cecum  D12.3, Benign neoplasm of transverse colon (hepatic                            flexure or splenic flexure)                           K64.4, Residual hemorrhoidal skin tags                           Q43.8, Other specified congenital malformations of                            intestine CPT copyright 2016 American Medical Association. All rights reserved. The codes documented in this report are preliminary and upon coder review may  be revised to meet current compliance requirements. Barney Drain, MD Barney Drain MD, MD 03/09/2017 1:22:16 PM This report has been signed electronically. Number of Addenda: 0

## 2017-03-09 NOTE — Anesthesia Preprocedure Evaluation (Signed)
Anesthesia Evaluation  Patient identified by MRN, date of birth, ID band Patient awake    Reviewed: Allergy & Precautions, H&P , NPO status , Patient's Chart, lab work & pertinent test results  Airway Mallampati: II  TM Distance: >3 FB     Dental  (+) Teeth Intact   Pulmonary sleep apnea ,    breath sounds clear to auscultation       Cardiovascular hypertension, Pt. on medications + CAD, + Past MI and +CHF   Rhythm:Regular Rate:Normal     Neuro/Psych PSYCHIATRIC DISORDERS ( Schizoaffective disorder) Bipolar Disorder    GI/Hepatic GERD  Controlled,  Endo/Other  diabetes, Type 2  Renal/GU Renal disease     Musculoskeletal  (+) Arthritis ,   Abdominal   Peds  Hematology   Anesthesia Other Findings   Reproductive/Obstetrics                             Anesthesia Physical Anesthesia Plan  ASA: III  Anesthesia Plan: MAC   Post-op Pain Management:    Induction: Intravenous  PONV Risk Score and Plan:   Airway Management Planned: Simple Face Mask  Additional Equipment:   Intra-op Plan:   Post-operative Plan:   Informed Consent: I have reviewed the patients History and Physical, chart, labs and discussed the procedure including the risks, benefits and alternatives for the proposed anesthesia with the patient or authorized representative who has indicated his/her understanding and acceptance.     Plan Discussed with:   Anesthesia Plan Comments:         Anesthesia Quick Evaluation  

## 2017-03-09 NOTE — Progress Notes (Signed)
Dr. Oneida Alar in to talk with patient and [name] [facility] for patient.  Any phone calls for continuity of care may be made to [facility].

## 2017-03-09 NOTE — H&P (Signed)
Primary Care Physician:  Sinda Du, MD Primary Gastroenterologist:  Dr. Oneida Alar  Pre-Procedure History & Physical: HPI:  [name] is a 67 y.o. male here for DYSPHAGIA/screening.  Past Medical History:  Diagnosis Date  . Arthritis   . Bipolar disorder ([place])   . CAD (coronary artery disease) 08/16/2016  . Cellulitis and abscess of foot 03/05/2016  . Chronic combined systolic and diastolic heart failure ([place])   . Diabetes mellitus without complication ([place])    boderline  . GERD (gastroesophageal reflux disease)   . Heart murmur 1995  . History of kidney stones   . Hypercholesteremia   . Hypertension   . Kidney stones   . Myocardial infarction ([place])   . OSA (obstructive sleep apnea)    no cpap, can't tolerate  . Schizoaffective disorder, bipolar type ([place]) 04/25/2016    Past Surgical History:  Procedure Laterality Date  . APPENDECTOMY    . CHOLECYSTECTOMY N/A 10/20/2013   Procedure: LAPAROSCOPIC CHOLECYSTECTOMY;  Surgeon: [name], MD;  Location: [facility];  Service: General;  Laterality: N/A;  . HIP SURGERY Left   . KNEE SURGERY Left     Prior to Admission medications   Medication Sig Start Date End Date Taking? Authorizing Provider  AMITIZA 24 MCG capsule Take 1 capsule by mouth 2 (two) times a week.  08/06/16  Yes [provider]  aspirin EC 81 MG tablet Take 81 mg by mouth daily.   Yes [provider]  carvedilol (COREG) 12.5 MG tablet Take 1 tablet (12.5 mg total) by mouth 2 (two) times daily. 09/01/16 03/03/17 Yes Lendon Colonel, NP  dexlansoprazole (DEXILANT) 60 MG capsule Take 1 capsule (60 mg total) by mouth daily. 02/17/17  Yes Annitta Needs, NP  furosemide (LASIX) 40 MG tablet Alternate 40 mg one day and 20 mg the next Patient taking differently: Take 40 mg by mouth daily.  07/17/16  Yes Herminio Commons, MD  linagliptin (TRADJENTA) 5 MG TABS tablet Take 5 mg by mouth daily.   Yes [provider]  Na Sulfate-K Sulfate-Mg Sulf  (SUPREP BOWEL PREP KIT) 17.5-3.13-1.6 GM/177ML SOLN Take 1 kit by mouth as directed. 01/29/17  Yes Shavana Calder L, MD  OLANZapine (ZYPREXA) 5 MG tablet Take 5 mg by mouth 2 (two) times daily.   Yes [provider]  potassium chloride SA (K-DUR,KLOR-CON) 20 MEQ tablet Take 20 mEq by mouth daily.   Yes [provider]  rosuvastatin (CRESTOR) 10 MG tablet Take 1 tablet (10 mg total) by mouth daily. 09/08/16 03/03/17 Yes Lendon Colonel, NP  sacubitril-valsartan (ENTRESTO) 49-51 MG Take 1 tablet by mouth 2 (two) times daily. 11/03/16  Yes Herminio Commons, MD    Allergies as of 01/29/2017 - Review Complete 01/29/2017  Allergen Reaction Noted  . Codeine Nausea And Vomiting 09/27/2013  . Morphine and related Nausea And Vomiting 09/27/2013    Family History  Problem Relation Age of Onset  . Hypertension Mother   . Dementia Mother   . Colon cancer Neg Hx   . Colon polyps Neg Hx     Social History   Socioeconomic History  . Marital status: Divorced    Spouse name: Not on file  . Number of children: Not on file  . Years of education: Not on file  . Highest education level: Not on file  Social Needs  . Financial resource strain: Not on file  . Food insecurity - worry: Not on file  . Food insecurity -  inability: Not on file  . Transportation needs - medical: Not on file  . Transportation needs - non-medical: Not on file  Occupational History  . Not on file  Tobacco Use  . Smoking status: Never Smoker  . Smokeless tobacco: Never Used  Substance and Sexual Activity  . Alcohol use: No  . Drug use: No  . Sexual activity: Not Currently    Birth control/protection: Implant  Other Topics Concern  . Not on file  Social History Narrative  . Not on file    Review of Systems: See HPI, otherwise negative ROS   Physical Exam: BP 102/62   Pulse 82   Temp 97.6 F (36.4 C) (Oral)   Resp 13   SpO2 98%  General:   Alert,  pleasant and cooperative in NAD Head:   Normocephalic and atraumatic. Neck:  Supple; Lungs:  Clear throughout to auscultation.    Heart:  Regular rate and rhythm. Abdomen:  Soft, nontender and nondistended. Normal bowel sounds, without guarding, and without rebound.   Neurologic:  Alert and  oriented x4;  grossly normal neurologically.  Impression/Plan:     DYSPHAGIA/screening  PLAN:  EGD/DIL/TCS TODAY DISCUSSED PROCEDURE, BENEFITS, & RISKS: < 1% chance of medication reaction, bleeding, perforation, or rupture of spleen/liver.  

## 2017-03-09 NOTE — Transfer of Care (Signed)
Immediate Anesthesia Transfer of Care Note  Patient: TOA MIA  Procedure(s) Performed: COLONOSCOPY WITH PROPOFOL (N/A ) ESOPHAGOGASTRODUODENOSCOPY (EGD) WITH PROPOFOL (N/A ) SAVORY DILATION (N/A ) POLYPECTOMY BIOPSY  Patient Location: PACU  Anesthesia Type:MAC  Level of Consciousness: awake, alert  and oriented  Airway & Oxygen Therapy: Patient Spontanous Breathing and Patient connected to nasal cannula oxygen  Post-op Assessment: Report given to RN and Post -op Vital signs reviewed and stable  Post vital signs: Reviewed and stable  Last Vitals:  Vitals:   03/09/17 1200 03/09/17 1205  BP:    Pulse:    Resp: 14 15  Temp:    SpO2: 97% 96%    Last Pain:  Vitals:   03/09/17 1050  TempSrc: Oral      Patients Stated Pain Goal: 5 (79/48/01 6553)  Complications: No apparent anesthesia complications

## 2017-03-09 NOTE — Anesthesia Postprocedure Evaluation (Signed)
Anesthesia Post Note  Patient: [name]  Procedure(s) Performed: COLONOSCOPY WITH PROPOFOL (N/A ) ESOPHAGOGASTRODUODENOSCOPY (EGD) WITH PROPOFOL (N/A ) SAVORY DILATION (N/A ) POLYPECTOMY BIOPSY  Patient location during evaluation: PACU Anesthesia Type: MAC Level of consciousness: awake and alert and patient cooperative Pain management: satisfactory to patient Vital Signs Assessment: post-procedure vital signs reviewed and stable Respiratory status: spontaneous breathing Cardiovascular status: stable Postop Assessment: no apparent nausea or vomiting Anesthetic complications: no     Last Vitals:  Vitals:   03/09/17 1315 03/09/17 1333  BP: 100/75 111/69  Pulse: 87 78  Resp: 16 18  Temp:  (!) 36.4 C  SpO2: 99% 96%    Last Pain:  Vitals:   03/09/17 1333  TempSrc: Oral                 Reida Hem

## 2017-03-10 DIAGNOSIS — C159 Malignant neoplasm of esophagus, unspecified: Secondary | ICD-10-CM | POA: Diagnosis not present

## 2017-03-10 DIAGNOSIS — E1121 Type 2 diabetes mellitus with diabetic nephropathy: Secondary | ICD-10-CM | POA: Diagnosis not present

## 2017-03-10 DIAGNOSIS — I129 Hypertensive chronic kidney disease with stage 1 through stage 4 chronic kidney disease, or unspecified chronic kidney disease: Secondary | ICD-10-CM | POA: Diagnosis not present

## 2017-03-10 DIAGNOSIS — F319 Bipolar disorder, unspecified: Secondary | ICD-10-CM | POA: Diagnosis not present

## 2017-03-12 ENCOUNTER — Ambulatory Visit (HOSPITAL_COMMUNITY)
Admission: RE | Admit: 2017-03-12 | Discharge: 2017-03-12 | Disposition: A | Discharge status: Home / Self Care | Payer: Medicare Other | Source: Ambulatory Visit | Attending: Gastroenterology | Admitting: Gastroenterology

## 2017-03-12 DIAGNOSIS — I700 Atherosclerosis of aorta: Secondary | ICD-10-CM | POA: Diagnosis not present

## 2017-03-12 DIAGNOSIS — D490 Neoplasm of unspecified behavior of digestive system: Secondary | ICD-10-CM

## 2017-03-12 MED ORDER — IOPAMIDOL (ISOVUE-300) INJECTION 61%
100.0000 mL | Freq: Once | INTRAVENOUS | Status: AC | PRN
  Administered 2017-03-12: 100 mL via INTRAVENOUS

## 2017-03-15 ENCOUNTER — Telehealth: Payer: Self-pay | Admitting: Gastroenterology

## 2017-03-15 ENCOUNTER — Encounter (HOSPITAL_COMMUNITY): Payer: Self-pay | Admitting: Gastroenterology

## 2017-03-15 NOTE — Telephone Encounter (Signed)
CALLED PT'S COUSIN TO DISCUSS RESULTS. EXPLAINED PT HAS CANCER IN THE ESOPHAGUS AND ONE LESION IN THE STOMACH. CT C/A/P SHOWS NO METS. APPT WITH ONC FRI FEB 1. COLON POLYPS ARE SIMPLE ADENOMAS.   OPV IN 6 MOS E30 DYSPHAGIA/ESOPHAGEAL ADENOCA/SIMPLE ADENOMAS. NEXT TCS IN 3 YEARS.

## 2017-03-15 NOTE — Telephone Encounter (Signed)
Noted  

## 2017-03-16 ENCOUNTER — Encounter: Payer: Self-pay | Admitting: Gastroenterology

## 2017-03-16 NOTE — Telephone Encounter (Signed)
PATIENT SCHEDULED AND ON RECALL  °

## 2017-03-16 NOTE — Progress Notes (Signed)
CC'D TO PCP °

## 2017-03-19 ENCOUNTER — Other Ambulatory Visit: Payer: Self-pay

## 2017-03-19 ENCOUNTER — Encounter (HOSPITAL_COMMUNITY): Payer: Self-pay | Admitting: Internal Medicine

## 2017-03-19 ENCOUNTER — Inpatient Hospital Stay (HOSPITAL_COMMUNITY): Payer: Medicare Other | Attending: Internal Medicine | Admitting: Internal Medicine

## 2017-03-19 ENCOUNTER — Inpatient Hospital Stay (HOSPITAL_COMMUNITY): Payer: Medicare Other

## 2017-03-19 VITALS — BP 110/77 | HR 79 | Temp 98.2°F | Resp 18 | Ht 64.0 in | Wt 187.6 lb

## 2017-03-19 DIAGNOSIS — N189 Chronic kidney disease, unspecified: Secondary | ICD-10-CM | POA: Insufficient documentation

## 2017-03-19 DIAGNOSIS — M129 Arthropathy, unspecified: Secondary | ICD-10-CM | POA: Diagnosis not present

## 2017-03-19 DIAGNOSIS — I252 Old myocardial infarction: Secondary | ICD-10-CM | POA: Diagnosis not present

## 2017-03-19 DIAGNOSIS — I2510 Atherosclerotic heart disease of native coronary artery without angina pectoris: Secondary | ICD-10-CM | POA: Diagnosis not present

## 2017-03-19 DIAGNOSIS — Z8619 Personal history of other infectious and parasitic diseases: Secondary | ICD-10-CM | POA: Insufficient documentation

## 2017-03-19 DIAGNOSIS — R011 Cardiac murmur, unspecified: Secondary | ICD-10-CM | POA: Insufficient documentation

## 2017-03-19 DIAGNOSIS — E119 Type 2 diabetes mellitus without complications: Secondary | ICD-10-CM | POA: Diagnosis not present

## 2017-03-19 DIAGNOSIS — C159 Malignant neoplasm of esophagus, unspecified: Secondary | ICD-10-CM

## 2017-03-19 DIAGNOSIS — I129 Hypertensive chronic kidney disease with stage 1 through stage 4 chronic kidney disease, or unspecified chronic kidney disease: Secondary | ICD-10-CM | POA: Diagnosis not present

## 2017-03-19 DIAGNOSIS — E7800 Pure hypercholesterolemia, unspecified: Secondary | ICD-10-CM | POA: Insufficient documentation

## 2017-03-19 DIAGNOSIS — I5042 Chronic combined systolic (congestive) and diastolic (congestive) heart failure: Secondary | ICD-10-CM | POA: Insufficient documentation

## 2017-03-19 DIAGNOSIS — C155 Malignant neoplasm of lower third of esophagus: Secondary | ICD-10-CM | POA: Diagnosis not present

## 2017-03-19 DIAGNOSIS — K6389 Other specified diseases of intestine: Secondary | ICD-10-CM

## 2017-03-19 DIAGNOSIS — Z87442 Personal history of urinary calculi: Secondary | ICD-10-CM | POA: Diagnosis not present

## 2017-03-19 DIAGNOSIS — K317 Polyp of stomach and duodenum: Secondary | ICD-10-CM | POA: Diagnosis not present

## 2017-03-19 DIAGNOSIS — G4730 Sleep apnea, unspecified: Secondary | ICD-10-CM | POA: Diagnosis not present

## 2017-03-19 DIAGNOSIS — F250 Schizoaffective disorder, bipolar type: Secondary | ICD-10-CM | POA: Insufficient documentation

## 2017-03-19 DIAGNOSIS — K2950 Unspecified chronic gastritis without bleeding: Secondary | ICD-10-CM | POA: Diagnosis not present

## 2017-03-19 DIAGNOSIS — K635 Polyp of colon: Secondary | ICD-10-CM | POA: Diagnosis not present

## 2017-03-19 DIAGNOSIS — K219 Gastro-esophageal reflux disease without esophagitis: Secondary | ICD-10-CM | POA: Insufficient documentation

## 2017-03-19 DIAGNOSIS — Z809 Family history of malignant neoplasm, unspecified: Secondary | ICD-10-CM | POA: Diagnosis not present

## 2017-03-19 LAB — CBC WITH DIFFERENTIAL/PLATELET
Basophils Absolute: 0 10*3/uL (ref 0.0–0.1)
Basophils Relative: 0 %
EOS ABS: 0.2 10*3/uL (ref 0.0–0.7)
Eosinophils Relative: 2 %
HEMATOCRIT: 44.4 % (ref 39.0–52.0)
HEMOGLOBIN: 14.7 g/dL (ref 13.0–17.0)
LYMPHS ABS: 2 10*3/uL (ref 0.7–4.0)
LYMPHS PCT: 22 %
MCH: 28.4 pg (ref 26.0–34.0)
MCHC: 33.1 g/dL (ref 30.0–36.0)
MCV: 85.7 fL (ref 78.0–100.0)
MONOS PCT: 9 %
Monocytes Absolute: 0.9 10*3/uL (ref 0.1–1.0)
NEUTROS PCT: 67 %
Neutro Abs: 6.1 10*3/uL (ref 1.7–7.7)
Platelets: 150 10*3/uL (ref 150–400)
RBC: 5.18 MIL/uL (ref 4.22–5.81)
RDW: 14 % (ref 11.5–15.5)
WBC: 9.1 10*3/uL (ref 4.0–10.5)

## 2017-03-19 LAB — COMPREHENSIVE METABOLIC PANEL
ALK PHOS: 100 U/L (ref 38–126)
ALT: 12 U/L — AB (ref 17–63)
ANION GAP: 11 (ref 5–15)
AST: 18 U/L (ref 15–41)
Albumin: 3.5 g/dL (ref 3.5–5.0)
BILIRUBIN TOTAL: 0.5 mg/dL (ref 0.3–1.2)
BUN: 18 mg/dL (ref 6–20)
CALCIUM: 8.9 mg/dL (ref 8.9–10.3)
CO2: 25 mmol/L (ref 22–32)
CREATININE: 1.34 mg/dL — AB (ref 0.61–1.24)
Chloride: 98 mmol/L — ABNORMAL LOW (ref 101–111)
GFR, EST NON AFRICAN AMERICAN: 53 mL/min — AB (ref >60)
Glucose, Bld: 279 mg/dL — ABNORMAL HIGH (ref 65–99)
Potassium: 3.7 mmol/L (ref 3.5–5.1)
SODIUM: 134 mmol/L — AB (ref 135–145)
TOTAL PROTEIN: 6.8 g/dL (ref 6.5–8.1)

## 2017-03-19 NOTE — Progress Notes (Signed)
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   HISTORY OF PRESENT ILLNESS:[name] is a 67 y.o. male who presented to his primary care physician with progressive dysphagia.  An EGD dated 02/27/2017 showed a medium-sized fungating mass with bleeding in the lower third of the esophagus and at the GE junction 30-35 cm from the incisors the mass was partially obstructing and circumferential Additional medium sized polypoid non-circumferential mass with no bleeding was found in the gastric fundus. Diffuse inflammation with edema and erythema antrum. Multiple small sessile polyps with no bleeding was found in the gastric body  He denies any weight loss, no bone pains, appettite is fair, but cant eat solids, drinking protein supplements and soft food.  No chest pain, SOB, cough, hoarseness of voice No change in bowel habits, no melena,  All other systems review is negative  Colonoscopy 03/09/2017 showed 10 mm polyp in the cecum to polyps in the proximal transverse colon and 3 additional polyps in the splenic flexure and transverse colon repeat colonoscopy in 3 years recommended a clip was placed in the colon.  Biopsies one lesion in the stomach cardia showed adenocarcinoma, although the polyps were benign. Chronic gastritis was noted.   Exercise tolerance is unlimited ECOG performance status is 0.   FAMILY HISTORY: family history includes Cancer in his mother; Dementia in his mother; Hypertension in his mother.   PAST MEDICAL HISTORY:  has a past medical history of Arthritis, Bipolar disorder ([place]), CAD (coronary artery disease) (08/16/2016), Cellulitis and abscess of foot (03/05/2016), Chronic combined systolic and diastolic heart failure ([place]), Diabetes mellitus without complication ([place]),  GERD (gastroesophageal reflux disease), Heart murmur (1995), History of kidney stones, Hypercholesteremia, Hypertension, Kidney stones, Myocardial infarction ([place]), OSA (obstructive sleep apnea), and Schizoaffective disorder, bipolar type ([place]) (04/25/2016).    CURRENT MEDICATIONS: Have been reviewed in the EMR   SOCIAL HISTORY:  reports that  has never smoked. he has never used smokeless tobacco. He reports that he does not drink alcohol or use drugs.   ALLERGIES: Codeine and Morphine and related   LABORATORY DATA: CBC  from 03/19/2017 are unremarkable.CBC shows slight increase in serum creatinine, 1.34  which is chronic and stable   PHYSICAL EXAM:  height is 5\' 4"  (1.626 m) and weight is 187 lb 9.6 oz (85.1 kg). His oral temperature is 98.2 F (36.8 C). His blood pressure is 110/77 and his pulse is 79. His respiration is 18 and oxygen saturation is 99%.  General appearance: alert, cooperative and no distress Head: Normocephalic, without obvious abnormality, atraumatic Neck: no adenopathy, no carotid bruit, no JVD, supple, symmetrical, trachea midline and thyroid not enlarged, symmetric, no tenderness/mass/nodules Lymph nodes: Cervical, supraclavicular, and axillary nodes normal. Resp: clear to auscultation bilaterally and normal percussion bilaterally Back: symmetric, no curvature. ROM normal. No CVA tenderness. Cardio: regular rate and rhythm, S1, S2 normal, no murmur, click, rub or gallop GI: soft, non-tender; bowel sounds normal; no masses,  no organomegaly Extremities: extremities normal, atraumatic, no cyanosis or edema Neurologic: Alert and oriented X 3, normal strength and tone. Normal symmetric reflexes. Normal coordination and gait Normal mood, memory intact.  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 - diagnosed 03/06/2017.Reviewed the path reports from EGD and colonoscopy. 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 Negative for Helicobacter pylori stomach. Staging is incomplete. CT chest abdomen pelvis with contrast 03/12/2017 has been reviewed.  Showed no metastatic disease sites except for small upper abdominal lymph nodes which are indeterminate. Arrange for endoscopic ultrasound and PET scan. If no metastasis, MR brain needs to be ordered.  Potential candidate for esophagectomy if no metastasis.  He will likely need neoadjuvant chemo radiotherapy, after EUS. I will discuss with cardiothoracic surgery.  His dysphagia currently limits his ability to eat solids, but he I sstill able to maintain weigth with liquid protein supplements. If dysphagia worsens, he may need a jejunostomy.   Request a CEA. Request NexGen sequencing. Request PDL 1 on tumor tissue  Bipolar disorder currently stable and he is on disability due to the last episode of hospitalization for this disorder was a year ago.Dr. Luan Pulling is his primary care physician who has been managing his bipolar disorder as well.  Potential for aggravation of symptoms with the use of steroids with chemotherapy.May have to involve psychiatrist during chemotherapy if needed.  History of coronary artery disease apparently had an old MI.  We will need to review cardiology notes with regard to the status of his disease.    Chronic kidney disease stable.- mild  DM-2 stable  MD visit back in 2 weeks to review scans .Arrange for the case to be discussed with the multidisciplinary GI tumor board at [place].   Addendum: 03/24/2017 Talked to Dr. Servando Snare today. He recommended that patient first have EUS by Owens Loffler, and then visit with him. If patient is having increasing dysphagia, he will need a jejunostomy,rather than a PEG tube, that could be arranged by general surgery here at [facility].

## 2017-03-19 NOTE — Progress Notes (Signed)
Appt w/Dr. Tamala Julian @ [facility] Cardioth[facility] 04/06/17 @ 1:30 (1:16 arrival). Notified pt's sister/POA, Langley Gauss, with appt details.

## 2017-03-19 NOTE — H&P (View-Only) (Signed)
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   HISTORY OF PRESENT ILLNESS:[name] is a 67 y.o. male who presented to his primary care physician with progressive dysphagia.  An EGD dated 02/27/2017 showed a medium-sized fungating mass with bleeding in the lower third of the esophagus and at the GE junction 30-35 cm from the incisors the mass was partially obstructing and circumferential Additional medium sized polypoid non-circumferential mass with no bleeding was found in the gastric fundus. Diffuse inflammation with edema and erythema antrum. Multiple small sessile polyps with no bleeding was found in the gastric body  He denies any weight loss, no bone pains, appettite is fair, but cant eat solids, drinking protein supplements and soft food.  No chest pain, SOB, cough, hoarseness of voice No change in bowel habits, no melena,  All other systems review is negative  Colonoscopy 03/09/2017 showed 10 mm polyp in the cecum to polyps in the proximal transverse colon and 3 additional polyps in the splenic flexure and transverse colon repeat colonoscopy in 3 years recommended a clip was placed in the colon.  Biopsies one lesion in the stomach cardia showed adenocarcinoma, although the polyps were benign. Chronic gastritis was noted.   Exercise tolerance is unlimited ECOG performance status is 0.   FAMILY HISTORY: family history includes Cancer in his mother; Dementia in his mother; Hypertension in his mother.   PAST MEDICAL HISTORY:  has a past medical history of Arthritis, Bipolar disorder ([place]), CAD (coronary artery disease) (08/16/2016), Cellulitis and abscess of foot (03/05/2016), Chronic combined systolic and diastolic heart failure ([place]), Diabetes mellitus without complication ([place]),  GERD (gastroesophageal reflux disease), Heart murmur (1995), History of kidney stones, Hypercholesteremia, Hypertension, Kidney stones, Myocardial infarction ([place]), OSA (obstructive sleep apnea), and Schizoaffective disorder, bipolar type ([place]) (04/25/2016).    CURRENT MEDICATIONS: Have been reviewed in the EMR   SOCIAL HISTORY:  reports that  has never smoked. he has never used smokeless tobacco. He reports that he does not drink alcohol or use drugs.   ALLERGIES: Codeine and Morphine and related   LABORATORY DATA: CBC  from 03/19/2017 are unremarkable.CBC shows slight increase in serum creatinine, 1.34  which is chronic and stable   PHYSICAL EXAM:  height is 5\' 4"  (1.626 m) and weight is 187 lb 9.6 oz (85.1 kg). His oral temperature is 98.2 F (36.8 C). His blood pressure is 110/77 and his pulse is 79. His respiration is 18 and oxygen saturation is 99%.  General appearance: alert, cooperative and no distress Head: Normocephalic, without obvious abnormality, atraumatic Neck: no adenopathy, no carotid bruit, no JVD, supple, symmetrical, trachea midline and thyroid not enlarged, symmetric, no tenderness/mass/nodules Lymph nodes: Cervical, supraclavicular, and axillary nodes normal. Resp: clear to auscultation bilaterally and normal percussion bilaterally Back: symmetric, no curvature. ROM normal. No CVA tenderness. Cardio: regular rate and rhythm, S1, S2 normal, no murmur, click, rub or gallop GI: soft, non-tender; bowel sounds normal; no masses,  no organomegaly Extremities: extremities normal, atraumatic, no cyanosis or edema Neurologic: Alert and oriented X 3, normal strength and tone. Normal symmetric reflexes. Normal coordination and gait Normal mood, memory intact.  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 - diagnosed 03/06/2017.Reviewed the path reports from EGD and colonoscopy. 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 Negative for Helicobacter pylori stomach. Staging is incomplete. CT chest abdomen pelvis with contrast 03/12/2017 has been reviewed.  Showed no metastatic disease sites except for small upper abdominal lymph nodes which are indeterminate. Arrange for endoscopic ultrasound and PET scan. If no metastasis, MR brain needs to be ordered.  Potential candidate for esophagectomy if no metastasis.  He will likely need neoadjuvant chemo radiotherapy, after EUS. I will discuss with cardiothoracic surgery.  His dysphagia currently limits his ability to eat solids, but he I sstill able to maintain weigth with liquid protein supplements. If dysphagia worsens, he may need a jejunostomy.   Request a CEA. Request NexGen sequencing. Request PDL 1 on tumor tissue  Bipolar disorder currently stable and he is on disability due to the last episode of hospitalization for this disorder was a year ago.Dr. Luan Pulling is his primary care physician who has been managing his bipolar disorder as well.  Potential for aggravation of symptoms with the use of steroids with chemotherapy.May have to involve psychiatrist during chemotherapy if needed.  History of coronary artery disease apparently had an old MI.  We will need to review cardiology notes with regard to the status of his disease.    Chronic kidney disease stable.- mild  DM-2 stable  MD visit back in 2 weeks to review scans .Arrange for the case to be discussed with the multidisciplinary GI tumor board at [place].   Addendum: 03/24/2017 Talked to Dr. Servando Snare today. He recommended that patient first have EUS by Owens Loffler, and then visit with him. If patient is having increasing dysphagia, he will need a jejunostomy,rather than a PEG tube, that could be arranged by general surgery here at [facility].

## 2017-03-20 LAB — CEA: CEA: 5.7 ng/mL — ABNORMAL HIGH (ref 0.0–4.7)

## 2017-03-24 ENCOUNTER — Encounter
Admission: RE | Admit: 2017-03-24 | Discharge: 2017-03-24 | Disposition: A | Discharge status: Home / Self Care | Payer: Medicare Other | Source: Ambulatory Visit | Attending: Internal Medicine | Admitting: Internal Medicine

## 2017-03-24 DIAGNOSIS — C159 Malignant neoplasm of esophagus, unspecified: Secondary | ICD-10-CM | POA: Diagnosis not present

## 2017-03-24 LAB — GLUCOSE, CAPILLARY: GLUCOSE-CAPILLARY: 187 mg/dL — AB (ref 65–99)

## 2017-03-24 MED ORDER — FLUDEOXYGLUCOSE F - 18 (FDG) INJECTION
12.0000 | Freq: Once | INTRAVENOUS | Status: AC | PRN
  Administered 2017-03-24: 12.77 via INTRAVENOUS

## 2017-03-25 ENCOUNTER — Telehealth (HOSPITAL_COMMUNITY): Payer: Self-pay | Admitting: Emergency Medicine

## 2017-03-25 ENCOUNTER — Encounter: Payer: Self-pay | Admitting: Radiation Oncology

## 2017-03-25 NOTE — Telephone Encounter (Signed)
Spoke with [name] in pathology to order HER2, MSI, and PDL1 on [id number].

## 2017-03-26 ENCOUNTER — Telehealth: Payer: Self-pay

## 2017-03-26 ENCOUNTER — Encounter (HOSPITAL_COMMUNITY): Payer: Self-pay | Admitting: Internal Medicine

## 2017-03-26 ENCOUNTER — Other Ambulatory Visit: Payer: Self-pay

## 2017-03-26 ENCOUNTER — Encounter (HOSPITAL_COMMUNITY): Payer: Self-pay | Admitting: *Deleted

## 2017-03-26 DIAGNOSIS — C159 Malignant neoplasm of esophagus, unspecified: Secondary | ICD-10-CM

## 2017-03-26 DIAGNOSIS — H2513 Age-related nuclear cataract, bilateral: Secondary | ICD-10-CM | POA: Diagnosis not present

## 2017-03-26 DIAGNOSIS — H25813 Combined forms of age-related cataract, bilateral: Secondary | ICD-10-CM | POA: Diagnosis not present

## 2017-03-26 DIAGNOSIS — H25013 Cortical age-related cataract, bilateral: Secondary | ICD-10-CM | POA: Diagnosis not present

## 2017-03-26 DIAGNOSIS — H40013 Open angle with borderline findings, low risk, bilateral: Secondary | ICD-10-CM | POA: Diagnosis not present

## 2017-03-26 NOTE — Telephone Encounter (Signed)
-----   Message from Milus Banister, MD sent at 03/26/2017  7:00 AM EST ----- Got, it.  Thanks  Keyunna Coco, He needs upper EUS, radial +/- linear, MAC sedation, next available Thursday.  Thanks.  Esophageal cancer staging.    DJ  ----- Message ----- From: Creola Corn, MD Sent: 03/25/2017   4:41 PM To: Milus Banister, MD  Dr. Ardis Hughs, Sorry, I should have been clear. We did not finalize any treatment plan. Waiting on the EUS- thanks.  ----- Message ----- From: Milus Banister, MD Sent: 03/25/2017   7:42 AM To: Creola Corn, MD  Dr. Sherrine Maples,  I am happy to help but I generally do not recommend EUS if it has already been decided that the patient will be getting radiation.    The EUS test results will not alter management (at least as far as I can tell).    Please let me know if you still prefer EUS with that in mind.  DJ   ----- Message ----- From: Creola Corn, MD Sent: 03/24/2017   3:45 PM To: Milus Banister, MD  Hi Dr. Ardis Hughs, I saw this gentleman recently with near obstructing EG junction adenocarcinoma, I need an EUS to document nodal status, although. I am referring him to radiation and will plan on chemoradiation just so we could get him started before he gets to the pin of needing a J tube. I am assuming that he will be at least a T3- but need to confirm before we start- thanks

## 2017-03-26 NOTE — Progress Notes (Signed)
Called [name] [phone]) in pathology to order Foundation One per Dr. [place].

## 2017-03-26 NOTE — Progress Notes (Signed)
GI Location of Tumor / Histology: esophageal adenocarcinoma  [name] presented with symptoms of: chronic GERD. States he will be eating and "can't hold it" and the food will just come back up.  Biopsies revealed:  03/09/17 Diagnosis 1. Colon, polyp(s), cecal, proximal transverse, splenic flexure, sigmoid - TUBULAR ADENOMA (X9 FRAGMENTS). - NO HIGH GRADE DYSPLASIA OR MALIGNANCY. 2. Colon, polyp(s), cecal - POLYPOID ARTERIOVENOUS MALFORMATION. - NO DYSPLASIA OR MALIGNANCY. 3. Stomach, biopsy, and gastric polyp - FUNDIC GLAND POLYPS. - FRAGMENT WITH MILD REACTIVE GASTROPATHY. - NEGATIVE FOR HELICOBACTER PYLORI. - NO INTESTINAL METAPLASIA, DYSPLASIA, OR MALIGNANCY. 4. Stomach, biopsy - ADENOCARCINOMA. 5. Esophagus, biopsy - ADENOCARCINOMA.  Past/Anticipated interventions by surgeon, if any: apt with Dr. Servando Snare on 04/14/17  Past/Anticipated interventions by medical oncology, if any:   Weight changes, if any: no  Bowel/Bladder complaints, if any: no  Nausea / Vomiting, if any: yes x 6 months when he attempts to eat bread or meat  Pain issues, if any:  no  Any blood per rectum:   no  SAFETY ISSUES:  Prior radiation? no  Pacemaker/ICD? no  Possible current pregnancy? no  Is the patient on methotrexate? no  Current Complaints/Details: CT abd/pelvis 03/12/17, PET scan 03/24/17

## 2017-03-26 NOTE — Progress Notes (Signed)
Patient now seeing Dr. Servando Snare.  Called and cancelled appt with Dr. Tamala Julian @ [facility].

## 2017-03-26 NOTE — Telephone Encounter (Signed)
04/15/17 1230 pm [facility] EUS

## 2017-03-29 ENCOUNTER — Telehealth: Payer: Self-pay

## 2017-03-29 NOTE — Telephone Encounter (Signed)
The pt has been moved from 2/28 to 04/08/17 1030 am, the pt POA was advised and instructed.

## 2017-03-29 NOTE — Telephone Encounter (Signed)
EUS scheduled, pt instructed and medications reviewed.  Patient instructions mailed to home.  Patient to call with any questions or concerns.  

## 2017-03-29 NOTE — Telephone Encounter (Signed)
-----   Message from Milus Banister, MD sent at 03/29/2017 12:09 PM EST ----- Yes, he is currently on for Feb 28th but I know about some openings sooner, will offer him Feb 21st.   Sheila Gervasi, Can you see if he can come in sooner for EUS (feb 21st instead of the 28th)?  Thanks   ----- Message ----- From: Hayden Pedro, PA-C Sent: 03/29/2017  12:05 PM To: Milus Banister, MD  Dr. Ardis Hughs- Are you aware of this pt? Looks like he has a new esophagus cancer and Dr. Servando Snare is going to see him but wanted EUS before he was seen.   Thanks, Bryson Ha

## 2017-03-30 ENCOUNTER — Telehealth (HOSPITAL_COMMUNITY): Payer: Self-pay | Admitting: Emergency Medicine

## 2017-03-30 ENCOUNTER — Ambulatory Visit
Admission: RE | Admit: 2017-03-30 | Discharge: 2017-03-30 | Disposition: A | Discharge status: Home / Self Care | Payer: Medicare Other | Source: Ambulatory Visit | Attending: Radiation Oncology | Admitting: Radiation Oncology

## 2017-03-30 ENCOUNTER — Other Ambulatory Visit: Payer: Self-pay

## 2017-03-30 ENCOUNTER — Encounter (HOSPITAL_COMMUNITY): Payer: Self-pay | Admitting: *Deleted

## 2017-03-30 ENCOUNTER — Encounter: Payer: Self-pay | Admitting: Radiation Oncology

## 2017-03-30 VITALS — BP 141/89 | HR 70 | Temp 97.8°F | Resp 18 | Ht 64.0 in | Wt 188.0 lb

## 2017-03-30 DIAGNOSIS — Z9049 Acquired absence of other specified parts of digestive tract: Secondary | ICD-10-CM | POA: Insufficient documentation

## 2017-03-30 DIAGNOSIS — I5042 Chronic combined systolic (congestive) and diastolic (congestive) heart failure: Secondary | ICD-10-CM | POA: Insufficient documentation

## 2017-03-30 DIAGNOSIS — M129 Arthropathy, unspecified: Secondary | ICD-10-CM | POA: Diagnosis not present

## 2017-03-30 DIAGNOSIS — Z79899 Other long term (current) drug therapy: Secondary | ICD-10-CM | POA: Diagnosis not present

## 2017-03-30 DIAGNOSIS — Z803 Family history of malignant neoplasm of breast: Secondary | ICD-10-CM | POA: Insufficient documentation

## 2017-03-30 DIAGNOSIS — I2510 Atherosclerotic heart disease of native coronary artery without angina pectoris: Secondary | ICD-10-CM | POA: Diagnosis not present

## 2017-03-30 DIAGNOSIS — K429 Umbilical hernia without obstruction or gangrene: Secondary | ICD-10-CM | POA: Diagnosis not present

## 2017-03-30 DIAGNOSIS — F250 Schizoaffective disorder, bipolar type: Secondary | ICD-10-CM | POA: Insufficient documentation

## 2017-03-30 DIAGNOSIS — I10 Essential (primary) hypertension: Secondary | ICD-10-CM | POA: Insufficient documentation

## 2017-03-30 DIAGNOSIS — G4730 Sleep apnea, unspecified: Secondary | ICD-10-CM | POA: Insufficient documentation

## 2017-03-30 DIAGNOSIS — Z86010 Personal history of colonic polyps: Secondary | ICD-10-CM | POA: Insufficient documentation

## 2017-03-30 DIAGNOSIS — Z510 Encounter for antineoplastic radiation therapy: Secondary | ICD-10-CM | POA: Diagnosis not present

## 2017-03-30 DIAGNOSIS — R634 Abnormal weight loss: Secondary | ICD-10-CM | POA: Insufficient documentation

## 2017-03-30 DIAGNOSIS — Z7982 Long term (current) use of aspirin: Secondary | ICD-10-CM | POA: Insufficient documentation

## 2017-03-30 DIAGNOSIS — R112 Nausea with vomiting, unspecified: Secondary | ICD-10-CM | POA: Diagnosis not present

## 2017-03-30 DIAGNOSIS — I700 Atherosclerosis of aorta: Secondary | ICD-10-CM | POA: Insufficient documentation

## 2017-03-30 DIAGNOSIS — I252 Old myocardial infarction: Secondary | ICD-10-CM | POA: Insufficient documentation

## 2017-03-30 DIAGNOSIS — N281 Cyst of kidney, acquired: Secondary | ICD-10-CM | POA: Diagnosis not present

## 2017-03-30 DIAGNOSIS — R011 Cardiac murmur, unspecified: Secondary | ICD-10-CM | POA: Diagnosis not present

## 2017-03-30 DIAGNOSIS — Z87442 Personal history of urinary calculi: Secondary | ICD-10-CM | POA: Insufficient documentation

## 2017-03-30 DIAGNOSIS — Z8619 Personal history of other infectious and parasitic diseases: Secondary | ICD-10-CM | POA: Diagnosis not present

## 2017-03-30 DIAGNOSIS — C160 Malignant neoplasm of cardia: Secondary | ICD-10-CM | POA: Insufficient documentation

## 2017-03-30 DIAGNOSIS — E7800 Pure hypercholesterolemia, unspecified: Secondary | ICD-10-CM | POA: Diagnosis not present

## 2017-03-30 DIAGNOSIS — R599 Enlarged lymph nodes, unspecified: Secondary | ICD-10-CM | POA: Diagnosis not present

## 2017-03-30 NOTE — Progress Notes (Signed)
See progress note under physician encounter. 

## 2017-03-30 NOTE — Telephone Encounter (Signed)
Spoke with [name] and she states that they would like to have chemotherapy in [place].  Talked with her about pt having a tube through treatment and that it would be temporary.  It would help with nutrition which is very important through out treatment.  Explained that we may even add fluid days to help keep the pt hydrated once he has started chemo/RT.  Spoke with Mike Craze NP and she wanted to see if we could get Dr Constance Haw to do a tube placement when she did a port.  Called the office to add this on to the pts consult on 04/01/2017.  Notified [name] that Dr Constance Haw would do the consultation for the port and peg tub placement on 04/01/2017 at 2:00 pm.  She verbalized understanding.  My number provided if she had any further questions.

## 2017-03-30 NOTE — Progress Notes (Signed)
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   HISTORY OF PRESENT ILLNESS: [name] is a 67 y.o. male seen at the request of Dr. Dr. Sherrine Maples for a newly diagnosed esophageal cancer.  The patient was found to have dysphagia for 6 months.  He underwent endoscopy and colonoscopy on 02/27/2017 revealing a mass in the lower third of the esophagus and involving the gastric cardia, both sites were biopsied revealing adenocarcinoma, he also had several benign stomach polyps, and tubular adenomatous changes within the colon.  He had a CEA of 5.7 checked on 03/19/2017, and has undergone imaging with CT scan on 03/12/2017 revealing indeterminate minute abdominal adenopathy, and thickening along the distal esophagus.  A PET scan on 03/24/2017 revealed a 7.2 cm lesion in the distal esophagus with hypermetabolism, no evidence of hypermetabolic adenopathy was noted.  He is scheduled to undergo Port-A-Cath placement with Dr. Constance Haw on 04/01/2017, he is also to meet Dr. Ardis Hughs for endoscopic ultrasound on 221, to determine if he is a surgical candidate which he will follow-up with Dr. Servando Snare on 04/14/2017 4.  He comes today to discuss the options of concurrent chemoRT.   PREVIOUS RADIATION THERAPY: No   PAST MEDICAL HISTORY:  Past Medical History:  Diagnosis Date  . Arthritis   . Bipolar disorder ([place])   . CAD (coronary artery disease) 08/16/2016  . Cellulitis and abscess of foot 03/05/2016  . Chronic combined systolic and diastolic heart failure ([place])   . Diabetes mellitus without complication ([place])    boderline  . GERD (gastroesophageal reflux disease)   . Heart murmur 1995  . History of  kidney stones   . Hypercholesteremia   . Hypertension   . Kidney stones   . Myocardial infarction ([place])   . OSA (obstructive sleep apnea)    no cpap, can't tolerate  . Schizoaffective disorder, bipolar type ([place]) 04/25/2016       PAST SURGICAL HISTORY: Past Surgical History:  Procedure Laterality Date  . APPENDECTOMY    . BIOPSY  03/09/2017   Procedure: BIOPSY;  Surgeon: Danie Binder, MD;  [facility];  Service: Endoscopy;;  gastric esophageal  . CHOLECYSTECTOMY N/A 10/20/2013   Procedure: LAPAROSCOPIC CHOLECYSTECTOMY;  Surgeon: [name], MD;  Location: [facility];  Service: General;  Laterality: N/A;  . COLONOSCOPY WITH PROPOFOL N/A 03/09/2017   Procedure: COLONOSCOPY WITH PROPOFOL;  Surgeon: Danie Binder, MD;  [facility]: [facility];  Service: Endoscopy;  Laterality: N/A;  12:15pm  . ESOPHAGOGASTRODUODENOSCOPY (EGD) WITH PROPOFOL N/A 03/09/2017   Procedure: ESOPHAGOGASTRODUODENOSCOPY (EGD) WITH PROPOFOL;  Surgeon: Danie Binder, MD;  [facility];  Service: Endoscopy;  Laterality: N/A;  . HIP SURGERY Left   . KNEE SURGERY Left   . POLYPECTOMY  03/09/2017   Procedure: POLYPECTOMY;  Surgeon: Danie Binder, MD;  [facility];  Service: Endoscopy;;  colon   . SAVORY DILATION N/A 03/09/2017   Procedure: SAVORY DILATION;  Surgeon: Danie Binder, MD;  [facility];  Service: Endoscopy;  Laterality: N/A;     FAMILY HISTORY:  Family History  Problem Relation Age of Onset  . Hypertension  Mother   . Dementia Mother   . Cancer Mother        breast  . Colon cancer Neg Hx   . Colon polyps Neg Hx      SOCIAL HISTORY:  reports that  has never smoked. he has never used smokeless tobacco. He reports that he does not drink alcohol or use drugs. The patient is single. He lives in [place] since his involuntary commitment last year due to stopping his psychiatric medications. He is accompanied by his cousin Langley Gauss who is his  HCPOA.    ALLERGIES: Codeine and Morphine and related   MEDICATIONS:  Current Outpatient Medications  Medication Sig Dispense Refill  . AMITIZA 24 MCG capsule Take 1 capsule by mouth 2 (two) times a week.     Marland Kitchen aspirin EC 81 MG tablet Take 81 mg by mouth daily.    Marland Kitchen dexlansoprazole (DEXILANT) 60 MG capsule Take 1 capsule (60 mg total) by mouth daily. 90 capsule 3  . furosemide (LASIX) 40 MG tablet Alternate 40 mg one day and 20 mg the next (Patient taking differently: Take 40 mg by mouth daily. ) 90 tablet 3  . linagliptin (TRADJENTA) 5 MG TABS tablet Take 5 mg by mouth daily.    Marland Kitchen OLANZapine (ZYPREXA) 5 MG tablet Take 5 mg by mouth 2 (two) times daily.    . potassium chloride SA (K-DUR,KLOR-CON) 20 MEQ tablet Take 20 mEq by mouth daily.    . sacubitril-valsartan (ENTRESTO) 49-51 MG Take 1 tablet by mouth 2 (two) times daily. 60 tablet 6  . carvedilol (COREG) 12.5 MG tablet Take 1 tablet (12.5 mg total) by mouth 2 (two) times daily. 180 tablet 3  . rosuvastatin (CRESTOR) 10 MG tablet Take 1 tablet (10 mg total) by mouth daily. 90 tablet 3   No current facility-administered medications for this encounter.      REVIEW OF SYSTEMS: On review of systems, the patient reports that he is doing well overall. He reports about 40 pounds of weight loss in the last 6 months unintentionally. He reports he feels like his bipolar depression is under good control. He denies any chest pain, shortness of breath, cough, fevers, chills, night sweats. He denies any bowel or bladder disturbances, and denies abdominal pain. He has had several episodes of nausea and  vomiting. He denies any new musculoskeletal or joint aches or pains. A complete review of systems is obtained and is otherwise negative.     PHYSICAL EXAM:  Wt Readings from Last 3 Encounters:  03/30/17 188 lb (85.3 kg)  03/19/17 187 lb 9.6 oz (85.1 kg)  03/03/17 186 lb (84.4 kg)   Temp Readings from Last 3 Encounters:  03/30/17 97.8 F (36.6  C) (Oral)  03/19/17 98.2 F (36.8 C) (Oral)  03/09/17 (!) 97.5 F (36.4 C) (Oral)   BP Readings from Last 3 Encounters:  03/30/17 (!) 141/89  03/19/17 110/77  03/09/17 111/69   Pulse Readings from Last 3 Encounters:  03/30/17 70  03/19/17 79  03/09/17 78   Pain Assessment Pain Score: 0-No pain/10  In general this is a well appearing caucasian male in no acute distress. He is alert and oriented x4 and appropriate throughout the examination. HEENT reveals that the patient is normocephalic, atraumatic. EOMs are intact. PERRLA. Skin is intact without any evidence of gross lesions. Cardiovascular exam reveals a regular rate and rhythm, no clicks rubs or murmurs are auscultated. Chest is clear to auscultation bilaterally. Lymphatic assessment is performed and  does not reveal any adenopathy in the cervical, supraclavicular, axillary, or inguinal chains. Abdomen has active bowel sounds in all quadrants and is intact. The abdomen is soft, non tender, non distended. Lower extremities are negative for pretibial pitting edema, deep calf tenderness, cyanosis or clubbing.   ECOG =1  0 - Asymptomatic (Fully active, able to carry on all predisease activities without restriction)  1 - Symptomatic but completely ambulatory (Restricted in physically strenuous activity but ambulatory and able to carry out work of a light or sedentary nature. For example, light housework, office work)  2 - Symptomatic, <50% in bed during the day (Ambulatory and capable of all self care but unable to carry out any work activities. Up and about more than 50% of waking hours)  3 - Symptomatic, >50% in bed, but not bedbound (Capable of only limited self-care, confined to bed or chair 50% or more of waking hours)  4 - Bedbound (Completely disabled. Cannot carry on any self-care. Totally confined to bed or chair)  5 - Death   Eustace Pen MM, Creech RH, Tormey DC, et al. [phone]). "Toxicity and response criteria of the [facility]". [place] Oncol. 5 (6): 649-55    LABORATORY DATA:  Lab Results  Component Value Date   WBC 9.1 03/19/2017   HGB 14.7 03/19/2017   HCT 44.4 03/19/2017   MCV 85.7 03/19/2017   PLT 150 03/19/2017   Lab Results  Component Value Date   NA 134 (L) 03/19/2017   K 3.7 03/19/2017   CL 98 (L) 03/19/2017   CO2 25 03/19/2017   Lab Results  Component Value Date   ALT 12 (L) 03/19/2017   AST 18 03/19/2017   ALKPHOS 100 03/19/2017   BILITOT 0.5 03/19/2017      RADIOGRAPHY: Ct Chest W Contrast  Result Date: 03/12/2017 CLINICAL DATA:  Neoplasm of digestive system: Gastric, staging. EXAM: CT CHEST, ABDOMEN, AND PELVIS WITH CONTRAST TECHNIQUE: Multidetector CT imaging of the chest, abdomen and pelvis was performed following the standard protocol during bolus administration of intravenous contrast. CONTRAST:  152mL ISOVUE-300 IOPAMIDOL (ISOVUE-300) INJECTION 61% COMPARISON:  CT urogram 12/15/2007. Reports from colonoscopy and upper endoscopy 03/09/2017. Esophageal and gastric adenocarcinoma on biopsies. FINDINGS: CT CHEST FINDINGS Cardiovascular: Mild atherosclerosis of the aorta, great vessels and coronary arteries. No acute vascular findings on noncontrast imaging. Probable aortic valvular calcifications. The heart size is normal. There is no pericardial effusion. Mediastinum/Nodes: There are no enlarged mediastinal, hilar or axillary lymph nodes. The thyroid gland and trachea appear normal. There is an irregular mass involving the distal 3rd of the esophagus, extending approximately 8 cm in length on sagittal image 116. No mediastinal extension of tumor, significant proximal esophageal distension or adjacent lymphadenopathy. Lungs/Pleura: No pleural effusion or pneumothorax. The lungs are essentially clear without suspicious pulmonary nodules. Musculoskeletal/Chest wall: No chest wall mass or suspicious osseous findings. CT ABDOMEN AND PELVIS FINDINGS Hepatobiliary: The  liver is normal in density without focal abnormality. Previous cholecystectomy without significant biliary dilatation. Pancreas: Unremarkable. No pancreatic ductal dilatation or surrounding inflammatory changes. Spleen: Normal in size without focal abnormality. Adrenals/Urinary Tract: Both adrenal glands appear normal. Chronic asymmetric renal cortical thinning on the left. There are small renal cysts bilaterally. No evidence of renal mass, hydronephrosis or ureteral calculus. The bladder appears normal. Stomach/Bowel: As above, there is a distal esophageal mass which extends to the gastroesophageal junction. No definite mass in the gastric cardia or fundus. The stomach is incompletely distended and suboptimally evaluated.  No evidence of bowel wall thickening, significant distention or surrounding inflammation. Moderate stool throughout the colon. Probable previous appendectomy. Vascular/Lymphatic: There are no enlarged abdominal or pelvic lymph nodes. There are small celiac and porta hepatis lymph nodes, measuring up to 9 on image 55. There is diffuse aortic and branch vessel atherosclerosis with intimal irregularity in the abdominal aorta. No acute vascular findings. Reproductive: The prostate gland and seminal vesicles demonstrate no significant findings. Other: Small umbilical hernia containing only fat.  No ascites. Musculoskeletal: No acute or significant osseous findings. Presumed postsurgical changes in the left iliac crest, stable. Mild lumbar spondylosis. IMPRESSION: 1. Large intraluminal mass in the distal esophagus consistent with adenocarcinoma. No esophageal obstruction, mediastinal extension of tumor or definite metastatic disease. No definite extension of tumor in the stomach visualized. 2. Small upper abdominal lymph nodes, nonspecific. No enlarged lymph nodes identified. 3. Chronic left renal cortical thinning. 4.  Aortic Atherosclerosis (ICD10-I70.0). Electronically Signed   By: Richardean Sale  M.D.   On: 03/12/2017 16:07   Ct Abdomen Pelvis W Contrast  Result Date: 03/12/2017 CLINICAL DATA:  Neoplasm of digestive system: Gastric, staging. EXAM: CT CHEST, ABDOMEN, AND PELVIS WITH CONTRAST TECHNIQUE: Multidetector CT imaging of the chest, abdomen and pelvis was performed following the standard protocol during bolus administration of intravenous contrast. CONTRAST:  150mL ISOVUE-300 IOPAMIDOL (ISOVUE-300) INJECTION 61% COMPARISON:  CT urogram 12/15/2007. Reports from colonoscopy and upper endoscopy 03/09/2017. Esophageal and gastric adenocarcinoma on biopsies. FINDINGS: CT CHEST FINDINGS Cardiovascular: Mild atherosclerosis of the aorta, great vessels and coronary arteries. No acute vascular findings on noncontrast imaging. Probable aortic valvular calcifications. The heart size is normal. There is no pericardial effusion. Mediastinum/Nodes: There are no enlarged mediastinal, hilar or axillary lymph nodes. The thyroid gland and trachea appear normal. There is an irregular mass involving the distal 3rd of the esophagus, extending approximately 8 cm in length on sagittal image 116. No mediastinal extension of tumor, significant proximal esophageal distension or adjacent lymphadenopathy. Lungs/Pleura: No pleural effusion or pneumothorax. The lungs are essentially clear without suspicious pulmonary nodules. Musculoskeletal/Chest wall: No chest wall mass or suspicious osseous findings. CT ABDOMEN AND PELVIS FINDINGS Hepatobiliary: The liver is normal in density without focal abnormality. Previous cholecystectomy without significant biliary dilatation. Pancreas: Unremarkable. No pancreatic ductal dilatation or surrounding inflammatory changes. Spleen: Normal in size without focal abnormality. Adrenals/Urinary Tract: Both adrenal glands appear normal. Chronic asymmetric renal cortical thinning on the left. There are small renal cysts bilaterally. No evidence of renal mass, hydronephrosis or ureteral calculus.  The bladder appears normal. Stomach/Bowel: As above, there is a distal esophageal mass which extends to the gastroesophageal junction. No definite mass in the gastric cardia or fundus. The stomach is incompletely distended and suboptimally evaluated. No evidence of bowel wall thickening, significant distention or surrounding inflammation. Moderate stool throughout the colon. Probable previous appendectomy. Vascular/Lymphatic: There are no enlarged abdominal or pelvic lymph nodes. There are small celiac and porta hepatis lymph nodes, measuring up to 9 on image 55. There is diffuse aortic and branch vessel atherosclerosis with intimal irregularity in the abdominal aorta. No acute vascular findings. Reproductive: The prostate gland and seminal vesicles demonstrate no significant findings. Other: Small umbilical hernia containing only fat.  No ascites. Musculoskeletal: No acute or significant osseous findings. Presumed postsurgical changes in the left iliac crest, stable. Mild lumbar spondylosis. IMPRESSION: 1. Large intraluminal mass in the distal esophagus consistent with adenocarcinoma. No esophageal obstruction, mediastinal extension of tumor or definite metastatic disease. No definite extension of tumor  in the stomach visualized. 2. Small upper abdominal lymph nodes, nonspecific. No enlarged lymph nodes identified. 3. Chronic left renal cortical thinning. 4.  Aortic Atherosclerosis (ICD10-I70.0). Electronically Signed   By: Richardean Sale M.D.   On: 03/12/2017 16:07   Nm Pet Image Initial (pi) Skull Base To Thigh  Result Date: 03/24/2017 CLINICAL DATA:  Initial. Treatment strategy for esophageal adenocarcinoma. EXAM: NUCLEAR MEDICINE PET SKULL BASE TO THIGH TECHNIQUE: 12.7 mCi F-18 FDG was injected intravenously. Full-ring PET imaging was performed from the skull base to thigh after the radiotracer. CT data was obtained and used for attenuation correction and anatomic localization. FASTING BLOOD GLUCOSE:   Value: 187 mg/dl COMPARISON:  None. FINDINGS: NECK: No hypermetabolic lymph nodes in the neck. CHEST: Mild cardiac enlargement. LAD coronary artery calcifications noted. The trachea appears patent and midline. Distal esophageal mass is identified measuring 7.6 cm in length within SUV max equal to 15.68. This extends from the level of the left inferior pulmonary vein to just above the diaphragmatic hiatus. No hypermetabolic mediastinal lymph nodes or hilar lymph nodes identified. No axillary or supraclavicular adenopathy. No pleural effusions identified.  No pulmonary nodules or masses. ABDOMEN/PELVIS: No abnormal hypermetabolic activity within the liver, pancreas, adrenal glands, or spleen. No hypermetabolic lymph nodes in the abdomen or pelvis. Left-sided inferior vena cava. Aortic atherosclerosis. SKELETON: No focal hypermetabolic activity to suggest skeletal metastasis. IMPRESSION: 1. Mass involving the distal esophagus measures approximately 7.6 cm in length and exhibits intense FDG uptake compatible with primary esophageal neoplasm. 2. No evidence for solid organ metastasis and no hypermetabolic lymph nodes identified. 3. Aortic Atherosclerosis (ICD10-I70.0). Lad coronary artery calcifications noted. Electronically Signed   By: Kerby Moors M.D.   On: 03/24/2017 12:54       IMPRESSION/PLAN: 1. 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 Dr. Lisbeth Renshaw discusses the pathology findings and reviews the nature of esophageal cancer. He reviews the multimodality therapy offered with chemoRT to shrink his tumor. He agrees with proceeding with EUS for formal staging and to meet with Dr. Servando Snare to determine if he is a candidate for surgical resection. We reviewed the role of radiotherapy and discussed the risks, benefits, short, and long term effects, and the patient is interested in proceeding. Dr. Lisbeth Renshaw discusses the delivery and logistics of radiotherapy and  anticipates a course of 5 1/2-6 weeks. We will proceed with simulation today. Written consent is obtained and placed in the chart, a copy was provided to the patient. We anticipate starting chemotherapy on  04/12/17.   The above documentation reflects my direct findings during this shared patient visit. Please see the separate note by Dr. Lisbeth Renshaw on this date for the remainder of the patient's plan of care.    Carola Rhine, PAC

## 2017-04-01 ENCOUNTER — Ambulatory Visit (INDEPENDENT_AMBULATORY_CARE_PROVIDER_SITE_OTHER): Payer: Medicare Other | Admitting: General Surgery

## 2017-04-01 ENCOUNTER — Telehealth: Payer: Self-pay | Admitting: Gastroenterology

## 2017-04-01 ENCOUNTER — Encounter: Payer: Self-pay | Admitting: General Surgery

## 2017-04-01 VITALS — BP 103/68 | HR 85 | Temp 99.8°F | Resp 18 | Ht 64.0 in | Wt 188.0 lb

## 2017-04-01 DIAGNOSIS — C160 Malignant neoplasm of cardia: Secondary | ICD-10-CM

## 2017-04-01 NOTE — Progress Notes (Signed)
[facility] History and Physical  Reason for Referral: Port a cath Placement, Feeding Tube Placement  Referring Physician:  [place]ton Hills   Chief Complaint    Esophageal Cancer     [name] is a 67 y.o. male.  HPI: [name] is a very pleasant 67 yo who comes today with his POA, cousin, Langley Gauss.  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 He also has lost about 50 lbs in the last few months per his report as he was 230 lb at one time.  Currently the patient reports he is having some difficulty with swallowing bread and meat, and has some regurgitation of solid foods.    He says he had reflux for years, and had never had a EGD prior to the one that diagnosed the cancer. He said that the prilosec and protonix never helped his symptoms.    He says that he does not recall having a MI but that he was told by cardiology that he had a "massive heart attack" in the past due to scar on his heart.  Cardiology notes indicate that a nuclear stress test on 07/08/16 demonstrated a large area of myocardial scar with no evidence of ischemia, LVEF 33%.    Past Medical History:  Diagnosis Date  . Arthritis   . Bipolar disorder ([place])   . CAD (coronary artery disease) 08/16/2016  . Cellulitis and abscess of foot 03/05/2016  . Chronic combined systolic and diastolic heart failure ([place])   . Diabetes mellitus without complication ([place])    boderline  . GERD (gastroesophageal reflux disease)   . Heart murmur 1995  . History of kidney stones   . Hypercholesteremia   . Hypertension   . Kidney stones   . Myocardial infarction ([place])   . OSA (obstructive sleep apnea)    no cpap, can't tolerate  . Schizoaffective disorder, bipolar type ([place])  04/25/2016    Past Surgical History:  Procedure Laterality Date  . APPENDECTOMY    . BIOPSY  03/09/2017   Procedure: BIOPSY;  Surgeon: Danie Binder, MD;  [facility];  Service: Endoscopy;;  gastric esophageal  . CHOLECYSTECTOMY N/A 10/20/2013   Procedure: LAPAROSCOPIC CHOLECYSTECTOMY;  Surgeon: [name], MD;  Location: [facility] OR[facility];  Service: General;  Laterality: N/A;  . COLONOSCOPY WITH PROPOFOL N/A 03/09/2017   Procedure: COLONOSCOPY WITH PROPOFOL;  Surgeon: Danie Binder, MD;  [facility];  Service: Endoscopy;  Laterality: N/A;  12:15pm  . ESOPHAGOGASTRODUODENOSCOPY (EGD) WITH PROPOFOL N/A 03/09/2017   Procedure: ESOPHAGOGASTRODUODENOSCOPY (EGD) WITH PROPOFOL;  Surgeon: Danie Binder, MD;  [facility];  Service: Endoscopy;  Laterality: N/A;  . HIP SURGERY Left   . KNEE SURGERY Left   . POLYPECTOMY  03/09/2017   Procedure: POLYPECTOMY;  Surgeon: Danie Binder, MD;  [facility];  Service: Endoscopy;;  colon   . SAVORY DILATION N/A 03/09/2017   Procedure: SAVORY DILATION;  Surgeon: Danie Binder, MD;  [facility];  Service: Endoscopy;  Laterality: N/A;    Family History  Problem Relation Age of Onset  . Hypertension Mother   . Dementia Mother   . Cancer Mother  breast  . Colon cancer Neg Hx   . Colon polyps Neg Hx     Social History   Tobacco Use  . Smoking status: Never Smoker  . Smokeless tobacco: Never Used  Substance Use Topics  . Alcohol use: No  . Drug use: No    Medications: I have reviewed the patient's current medications. Allergies as of 04/01/2017      Reactions   Codeine Nausea And Vomiting   Morphine And Related Nausea And Vomiting      Medication List        Accurate as of 04/01/17  3:09 PM. Always use your most recent med list.          acetaminophen 325 MG tablet Commonly known as:  TYLENOL Take 650 mg by mouth every 6 (six) hours as needed.   AMITIZA 24 MCG  capsule Generic drug:  lubiprostone Take 24 mcg by mouth 2 (two) times a week.   aspirin EC 81 MG tablet Take 81 mg by mouth daily.   carvedilol 12.5 MG tablet Commonly known as:  COREG Take 1 tablet (12.5 mg total) by mouth 2 (two) times daily.   dexlansoprazole 60 MG capsule Commonly known as:  DEXILANT Take 1 capsule (60 mg total) by mouth daily.   furosemide 40 MG tablet Commonly known as:  LASIX Alternate 40 mg one day and 20 mg the next   linagliptin 5 MG Tabs tablet Commonly known as:  TRADJENTA Take 5 mg by mouth daily.   OLANZapine 5 MG tablet Commonly known as:  ZYPREXA Take 5 mg by mouth 2 (two) times daily.   potassium chloride SA 20 MEQ tablet Commonly known as:  K-DUR,KLOR-CON Take 20 mEq by mouth daily.   rosuvastatin 10 MG tablet Commonly known as:  CRESTOR Take 1 tablet (10 mg total) by mouth daily.   sacubitril-valsartan 49-51 MG Commonly known as:  ENTRESTO Take 1 tablet by mouth 2 (two) times daily.       ROS:  A comprehensive review of systems was negative except for: Gastrointestinal: positive for abdominal pain, nausea and vomiting  Blood pressure 103/68, pulse 85, temperature 99.8 F (37.7 C), resp. rate 18, height 5\' 4"  (1.626 m), weight 188 lb (85.3 kg). Physical Exam  Constitutional: He is oriented to person, place, and time and well-developed, well-nourished, and in no distress.  HENT:  Head: Normocephalic.  Eyes: Pupils are equal, round, and reactive to light.  Neck: Normal range of motion.  Cardiovascular: Normal rate and regular rhythm.  Murmur heard.  Systolic murmur is present. Pulmonary/Chest: Effort normal and breath sounds normal.  Abdominal: Soft. He exhibits distension. There is no tenderness.  Musculoskeletal: Normal range of motion. He exhibits no edema.  Neurological: He is alert and oriented to person, place, and time.  Skin: Skin is warm and dry.  Psychiatric: Mood, memory, affect and judgment normal.     Results: CT a/p and Chest and PET scan personally reviewed; distal esophageal mass, no adenopathy on PET but some enlarged nodes on CT,   CT-02/2017  IMPRESSION: 1. Large intraluminal mass in the distal esophagus consistent with adenocarcinoma. No esophageal obstruction, mediastinal extension of tumor or definite metastatic disease. No definite extension of tumor in the stomach visualized. 2. Small upper abdominal lymph nodes, nonspecific. No enlarged lymph nodes identified. 3. Chronic left renal cortical thinning. 4.  Aortic Atherosclerosis (ICD10-I70.0).  PET 03/2017 IMPRESSION: 1. Mass involving the distal esophagus measures approximately 7.6 cm in length and exhibits intense FDG  uptake compatible with primary esophageal neoplasm. 2. No evidence for solid organ metastasis and no hypermetabolic lymph nodes identified. 3. Aortic Atherosclerosis (ICD10-I70.0). Lad coronary artery calcifications noted.  Assessment & Plan:  [name] is a 67 y.o. male with esophageal cancer at the distal esophagus who has no obvious metastatic disease on his PET/ CT scans.  He is undergoing EUS on 2/21 and has cardiothoracic follow up on 2/27. There are plans to start his radiation treatment on 2/25.  Plans for chemotherapy to start soon after based on the information from the Oncology Clinic, [facility].   - Port a cath needed for chemotherapy, plans for port placement in the next few weeks - Also discussed feeding tube and need for patient to see Dr. Servando Snare prior to determine plans for any of this given the need for gastric conduit versus another conduit given the lesions described in the fundus that were positive for disease -Discussed that we do not want to disrupt any plans of Dr. Servando Snare with regards to reconstruction ie will need jejunostomy and that if Dr. Servando Snare does not think the patient is an operative candidate for any other reason, that this could also change the plans for a  feeding tube ie gastrostomy   - ECHO from 10/2016 with improved EF to 40-45 %, moderate aortic stenosis; no CP or SOB described   -Patient lives alone and takes care of his daily activities and drives    All questions were answered to the satisfaction of the patient and family.  The risk and benefits of port a cath were discussed including but not limited to bleeding, infection, malfunction, pneumothorax and use of fluoroscopy during the placement.  After careful consideration, [name] has decided to proceed.    Virl Cagey 04/01/2017, 3:09 PM

## 2017-04-01 NOTE — Telephone Encounter (Signed)
The pt has been advised he does not need to stop ASA

## 2017-04-02 ENCOUNTER — Encounter (HOSPITAL_COMMUNITY): Payer: Self-pay

## 2017-04-02 ENCOUNTER — Inpatient Hospital Stay (HOSPITAL_COMMUNITY): Payer: Medicare Other | Admitting: Internal Medicine

## 2017-04-05 ENCOUNTER — Encounter (HOSPITAL_COMMUNITY): Payer: Self-pay

## 2017-04-05 ENCOUNTER — Encounter (HOSPITAL_COMMUNITY)
Admission: RE | Admit: 2017-04-05 | Discharge: 2017-04-05 | Disposition: A | Discharge status: Home / Self Care | Payer: Medicare Other | Source: Ambulatory Visit | Attending: General Surgery | Admitting: General Surgery

## 2017-04-05 NOTE — Patient Instructions (Signed)
[name]  04/05/2017     @PREFPERIOPPHARMACY @   Your procedure is scheduled on 04/07/2017.  Report to [facility] at 11:00 A.M.  Call this number if you have problems the morning of surgery:  [phone]   Remember:  Do not eat food or drink liquids after midnight.  Take these medicines the morning of surgery with A SIP OF WATER Coreg, Dexilant, Entresto and Zyprexa   Do not wear jewelry, make-up or nail polish.  Do not wear lotions, powders, or perfumes, or deodorant.  Do not shave 48 hours prior to surgery.  Men may shave face and neck.  Do not bring valuables to the hospital.  [facility] is not responsible for any belongings or valuables.  Contacts, dentures or bridgework may not be worn into surgery.  Leave your suitcase in the car.  After surgery it may be brought to your room.  For patients admitted to the hospital, discharge time will be determined by your treatment team.  Patients discharged the day of surgery will not be allowed to drive home.   Name and phone number of your driver:   family Special instructions:  n/a Implanted Port Insertion Implanted port insertion is a procedure to put in a port and catheter. The port is a device with an injectable disk that can be accessed by your health care provider. The port is connected to a vein in the chest or neck by a small flexible tube (catheter). There are different types of ports. The implanted port may be used as a long-term IV access for:  Medicines, such as chemotherapy.  Fluids.  Liquid nutrition, such as total parenteral nutrition (TPN).  Blood samples.  Having a port means that your health care provider will not need to use the veins in your arms for these procedures. Tell a health care provider about:  Any allergies you have.  All medicines you are taking, especially blood thinners, as well as any vitamins, herbs, eye drops, creams, over-the-counter medicines, and steroids.  Any problems you or  family members have had with anesthetic medicines.  Any blood disorders you have.  Any surgeries you have had.  Any medical conditions you have, including diabetes or kidney problems.  Whether you are pregnant or may be pregnant. What are the risks? Generally, this is a safe procedure. However, problems may occur, including:  Allergic reactions to medicines or dyes.  Damage to other structures or organs.  Infection.  Damage to the blood vessel, bruising, or bleeding at the puncture site.  Blood clot.  Breakdown of the skin over the port.  A collection of air in the chest that can cause one of the lungs to collapse (pneumothorax). This is rare.  What happens before the procedure? Staying hydrated Follow instructions from your health care provider about hydration, which may include:  Up to 2 hours before the procedure - you may continue to drink clear liquids, such as water, clear fruit juice, black coffee, and plain tea.  Eating and drinking restrictions  Follow instructions from your health care provider about eating and drinking, which may include: ? 8 hours before the procedure - stop eating heavy meals or foods such as meat, fried foods, or fatty foods. ? 6 hours before the procedure - stop eating light meals or foods, such as toast or cereal. ? 6 hours before the procedure - stop drinking milk or drinks that contain milk. ? 2 hours before the procedure - stop drinking clear liquids.  Medicines  Ask your health care provider about: ? Changing or stopping your regular medicines. This is especially important if you are taking diabetes medicines or blood thinners. ? Taking medicines such as aspirin and ibuprofen. These medicines can thin your blood. Do not take these medicines before your procedure if your health care provider instructs you not to.  You may be given antibiotic medicine to help prevent infection. General instructions  Plan to have someone take you home  from the hospital or clinic.  If you will be going home right after the procedure, plan to have someone with you for 24 hours.  You may have blood tests.  You may be asked to shower with a germ-killing soap. What happens during the procedure?  To lower your risk of infection: ? Your health care team will wash or sanitize their hands. ? Your skin will be washed with soap. ? Hair may be removed from the surgical area.  An IV tube will be inserted into one of your veins.  You will be given one or more of the following: ? A medicine to help you relax (sedative). ? A medicine to numb the area (local anesthetic).  Two small cuts (incisions) will be made to insert the port. ? One incision will be made in your neck to get access to the vein where the catheter will lie. ? The other incision will be made in the upper chest. This is where the port will lie.  The procedure may be done using continuous X-ray (fluoroscopy) or other imaging tools for guidance.  The port and catheter will be placed. There may be a small, raised area where the port is.  The port will be flushed with a salt solution (saline), and blood will be drawn to make sure that it is working correctly.  The incisions will be closed.  Bandages (dressings) may be placed over the incisions. The procedure may vary among health care providers and hospitals. What happens after the procedure?  Your blood pressure, heart rate, breathing rate, and blood oxygen level will be monitored until the medicines you were given have worn off.  Do not drive for 24 hours if you were given a sedative.  You will be given a manufacturer's information card for the type of port that you have. Keep this with you.  Your port will need to be flushed and checked as told by your health care provider, usually every few weeks.  A chest X-ray will be done to: ? Check the placement of the port. ? Make sure there is no injury to your  lung. Summary  Implanted port insertion is a procedure to put in a port and catheter.  The implanted port is used as a long-term IV access.  The port will need to be flushed and checked as told by your health care provider, usually every few weeks.  Keep your manufacturer's information card with you at all times. This information is not intended to replace advice given to you by your health care provider. Make sure you discuss any questions you have with your health care provider. Document Released: 11/23/2012 Document Revised: 12/25/2015 Document Reviewed: 12/25/2015 Elsevier Interactive Patient Education  2017 [employer].   Please read over the following fact sheets that you were given. Care and Recovery After Surgery

## 2017-04-06 DIAGNOSIS — F319 Bipolar disorder, unspecified: Secondary | ICD-10-CM | POA: Diagnosis not present

## 2017-04-06 DIAGNOSIS — E1121 Type 2 diabetes mellitus with diabetic nephropathy: Secondary | ICD-10-CM | POA: Diagnosis not present

## 2017-04-06 DIAGNOSIS — I129 Hypertensive chronic kidney disease with stage 1 through stage 4 chronic kidney disease, or unspecified chronic kidney disease: Secondary | ICD-10-CM | POA: Diagnosis not present

## 2017-04-06 DIAGNOSIS — C159 Malignant neoplasm of esophagus, unspecified: Secondary | ICD-10-CM | POA: Diagnosis not present

## 2017-04-06 NOTE — H&P (Signed)
[facility] History and Physical  Reason for Referral: Port a cath Placement, Feeding Tube Placement  Referring Physician:  [place]      Chief Complaint    Esophageal Cancer     [name] is a 67 y.o. male.  HPI: Mr..  [name] is a very pleasant 67 yo who comes today with his POA, cousin, Langley Gauss.  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 He also has lost about 50 lbs in Zachary last few months per his report as he was 230 lb at one time.  Currently Zachary patient reports he is having some difficulty with swallowing bread and meat, and has some regurgitation of solid foods.    He says he had reflux for years, and had never had a EGD prior to Zachary one that diagnosed Zachary cancer. He said that Zachary prilosec and protonix never helped his symptoms.    He says that he does not recall having a MI but that he was told by cardiology that he had a "massive heart attack" in Zachary past due to scar on his heart.  Cardiology notes indicate that a nuclear stress test on 07/08/16 demonstrated a large area of myocardial scar with no evidence of ischemia, LVEF 33%.        Past Medical History:  Diagnosis Date  . Arthritis   . Bipolar disorder ([place])   . CAD (coronary artery disease) 08/16/2016  . Cellulitis and abscess of foot 03/05/2016  . Chronic combined systolic and diastolic heart failure ([place])   . Diabetes mellitus without complication ([place])    boderline  . GERD (gastroesophageal reflux disease)   . Heart murmur 1995  . History of kidney stones   . Hypercholesteremia   . Hypertension   . Kidney stones   . Myocardial infarction ([place])   . OSA (obstructive sleep apnea)    no cpap, can't tolerate  .  Schizoaffective disorder, bipolar type ([place]) 04/25/2016         Past Surgical History:  Procedure Laterality Date  . APPENDECTOMY    . BIOPSY  03/09/2017   Procedure: BIOPSY;  Surgeon: Danie Binder, MD;  [facility];  Service: Endoscopy;;  gastric esophageal  . CHOLECYSTECTOMY N/A 10/20/2013   Procedure: LAPAROSCOPIC CHOLECYSTECTOMY;  Surgeon: [name], MD;  Location: [facility];  Service: General;  Laterality: N/A;  . COLONOSCOPY WITH PROPOFOL N/A 03/09/2017   Procedure: COLONOSCOPY WITH PROPOFOL;  Surgeon: Danie Binder, MD;  [facility];  Service: Endoscopy;  Laterality: N/A;  12:15pm  . ESOPHAGOGASTRODUODENOSCOPY (EGD) WITH PROPOFOL N/A 03/09/2017   Procedure: ESOPHAGOGASTRODUODENOSCOPY (EGD) WITH PROPOFOL;  Surgeon: Danie Binder, MD;  [facility];  Service: Endoscopy;  Laterality: N/A;  . HIP SURGERY Left   . KNEE SURGERY Left   . POLYPECTOMY  03/09/2017   Procedure: POLYPECTOMY;  Surgeon: Danie Binder, MD;  [facility];  Service: Endoscopy;;  colon   . SAVORY DILATION N/A 03/09/2017   Procedure: SAVORY DILATION;  Surgeon: Danie Binder, MD;  [facility];  Service: Endoscopy;  Laterality: N/A;         Family History  Problem Relation Age of Onset  . Hypertension Mother   .  Dementia Mother   . Cancer Mother        breast  . Colon cancer Neg Hx   . Colon polyps Neg Hx     Social History       Tobacco Use  . Smoking status: Never Smoker  . Smokeless tobacco: Never Used  Substance Use Topics  . Alcohol use: No  . Drug use: No    Medications: I have reviewed Zachary patient's current medications.     Allergies as of 04/01/2017      Reactions   Codeine Nausea And Vomiting   Morphine And Related Nausea And Vomiting            Medication List             Accurate as of 04/01/17  3:09 PM. Always use your most recent med list.           acetaminophen 325 MG  tablet Commonly known as:  TYLENOL Take 650 mg by mouth every 6 (six) hours as needed.   AMITIZA 24 MCG capsule Generic drug:  lubiprostone Take 24 mcg by mouth 2 (two) times a week.   aspirin EC 81 MG tablet Take 81 mg by mouth daily.   carvedilol 12.5 MG tablet Commonly known as:  COREG Take 1 tablet (12.5 mg total) by mouth 2 (two) times daily.   dexlansoprazole 60 MG capsule Commonly known as:  DEXILANT Take 1 capsule (60 mg total) by mouth daily.   furosemide 40 MG tablet Commonly known as:  LASIX Alternate 40 mg one day and 20 mg Zachary next   linagliptin 5 MG Tabs tablet Commonly known as:  TRADJENTA Take 5 mg by mouth daily.   OLANZapine 5 MG tablet Commonly known as:  ZYPREXA Take 5 mg by mouth 2 (two) times daily.   potassium chloride SA 20 MEQ tablet Commonly known as:  K-DUR,KLOR-CON Take 20 mEq by mouth daily.   rosuvastatin 10 MG tablet Commonly known as:  CRESTOR Take 1 tablet (10 mg total) by mouth daily.   sacubitril-valsartan 49-51 MG Commonly known as:  ENTRESTO Take 1 tablet by mouth 2 (two) times daily.       ROS:  A comprehensive review of systems was negative except for: Gastrointestinal: positive for abdominal pain, nausea and vomiting  Blood pressure 103/68, pulse 85, temperature 99.8 F (37.7 C), resp. rate 18, height 5\' 4"  (1.626 m), weight 188 lb (85.3 kg). Physical Exam  Constitutional: He is oriented to person, place, and time and well-developed, well-nourished, and in no distress.  HENT:  Head: Normocephalic.  Eyes: Pupils are equal, round, and reactive to light.  Neck: Normal range of motion.  Cardiovascular: Normal rate and regular rhythm.  Murmur heard.  Systolic murmur is present. Pulmonary/Chest: Effort normal and breath sounds normal.  Abdominal: Soft. He exhibits distension. There is no tenderness.  Musculoskeletal: Normal range of motion. He exhibits no edema.  Neurological: He is alert and oriented to  person, place, and time.  Skin: Skin is warm and dry.  Psychiatric: Mood, memory, affect and judgment normal.    Results: CT a/p and Chest and PET scan personally reviewed; distal esophageal mass, no adenopathy on PET but some enlarged nodes on CT,   CT-02/2017  IMPRESSION: 1. Large intraluminal mass in Zachary distal esophagus consistent with adenocarcinoma. No esophageal obstruction, mediastinal extension of tumor or definite metastatic disease. No definite extension of tumor in Zachary stomach visualized. 2. Small upper abdominal lymph nodes, nonspecific. No enlarged  lymph nodes identified. 3. Chronic left renal cortical thinning. 4. Aortic Atherosclerosis (ICD10-I70.0).  PET 03/2017 IMPRESSION: 1. Mass involving Zachary distal esophagus measures approximately 7.6 cm in length and exhibits intense FDG uptake compatible with primary esophageal neoplasm. 2. No evidence for solid organ metastasis and no hypermetabolic lymph nodes identified. 3. Aortic Atherosclerosis (ICD10-I70.0). Lad coronary artery calcifications noted.  Assessment & Plan:  GARNETT NUNZIATA is a 67 y.o. male with esophageal cancer at Zachary distal esophagus who has no obvious metastatic disease on his PET/ CT scans.  He is undergoing EUS on 2/21 and has cardiothoracic follow up on 2/27. There are plans to start his radiation treatment on 2/25.  Plans for chemotherapy to start soon after based on Zachary information from [facility].   - Port a cath needed for chemotherapy, plans for port placement in Zachary next few weeks - Also discussed feeding tube and need for patient to see Dr. Servando Snare prior to determine plans for any of this given Zachary need for gastric conduit versus another conduit given Zachary lesions described in Zachary fundus that were positive for disease -Discussed that we do not want to disrupt any plans of Dr. Servando Snare with regards to reconstruction ie will need jejunostomy and that if Dr. Servando Snare  does not think Zachary patient is an operative candidate for any other reason, that this could also change Zachary plans for a feeding tube ie gastrostomy   - ECHO from 10/2016 with improved EF to 40-45 %, moderate aortic stenosis; no CP or SOB described   -Patient lives alone and takes care of his daily activities and drives    All questions were answered to Zachary satisfaction of Zachary patient and family.  Zachary risk and benefits of port a cath were discussed including but not limited to bleeding, infection, malfunction, pneumothorax and use of fluoroscopy during Zachary placement.  After careful consideration, SEYON STRADER has decided to proceed.    Virl Cagey 04/01/2017, 3:09 PM

## 2017-04-07 ENCOUNTER — Encounter (HOSPITAL_COMMUNITY)
Admission: RE | Disposition: A | Discharge status: Home / Self Care | Payer: Self-pay | Source: Ambulatory Visit | Attending: General Surgery

## 2017-04-07 ENCOUNTER — Ambulatory Visit (HOSPITAL_COMMUNITY): Payer: Medicare Other | Admitting: Anesthesiology

## 2017-04-07 ENCOUNTER — Ambulatory Visit (HOSPITAL_COMMUNITY): Payer: Medicare Other

## 2017-04-07 ENCOUNTER — Encounter (HOSPITAL_COMMUNITY): Payer: Self-pay | Admitting: *Deleted

## 2017-04-07 ENCOUNTER — Other Ambulatory Visit: Payer: Self-pay

## 2017-04-07 ENCOUNTER — Ambulatory Visit (HOSPITAL_COMMUNITY)
Admission: RE | Admit: 2017-04-07 | Discharge: 2017-04-07 | Disposition: A | Discharge status: Home / Self Care | Payer: Medicare Other | Source: Ambulatory Visit | Attending: General Surgery | Admitting: General Surgery

## 2017-04-07 DIAGNOSIS — Z7982 Long term (current) use of aspirin: Secondary | ICD-10-CM | POA: Insufficient documentation

## 2017-04-07 DIAGNOSIS — C159 Malignant neoplasm of esophagus, unspecified: Secondary | ICD-10-CM | POA: Diagnosis not present

## 2017-04-07 DIAGNOSIS — I110 Hypertensive heart disease with heart failure: Secondary | ICD-10-CM | POA: Diagnosis not present

## 2017-04-07 DIAGNOSIS — Z7984 Long term (current) use of oral hypoglycemic drugs: Secondary | ICD-10-CM | POA: Diagnosis not present

## 2017-04-07 DIAGNOSIS — Z79899 Other long term (current) drug therapy: Secondary | ICD-10-CM | POA: Diagnosis not present

## 2017-04-07 DIAGNOSIS — K219 Gastro-esophageal reflux disease without esophagitis: Secondary | ICD-10-CM | POA: Insufficient documentation

## 2017-04-07 DIAGNOSIS — I5042 Chronic combined systolic (congestive) and diastolic (congestive) heart failure: Secondary | ICD-10-CM | POA: Insufficient documentation

## 2017-04-07 DIAGNOSIS — I2510 Atherosclerotic heart disease of native coronary artery without angina pectoris: Secondary | ICD-10-CM | POA: Diagnosis not present

## 2017-04-07 DIAGNOSIS — G4733 Obstructive sleep apnea (adult) (pediatric): Secondary | ICD-10-CM | POA: Insufficient documentation

## 2017-04-07 DIAGNOSIS — I700 Atherosclerosis of aorta: Secondary | ICD-10-CM | POA: Insufficient documentation

## 2017-04-07 DIAGNOSIS — Z452 Encounter for adjustment and management of vascular access device: Secondary | ICD-10-CM

## 2017-04-07 DIAGNOSIS — C155 Malignant neoplasm of lower third of esophagus: Secondary | ICD-10-CM

## 2017-04-07 DIAGNOSIS — E7800 Pure hypercholesterolemia, unspecified: Secondary | ICD-10-CM | POA: Diagnosis not present

## 2017-04-07 DIAGNOSIS — I252 Old myocardial infarction: Secondary | ICD-10-CM | POA: Diagnosis not present

## 2017-04-07 DIAGNOSIS — C169 Malignant neoplasm of stomach, unspecified: Secondary | ICD-10-CM | POA: Diagnosis not present

## 2017-04-07 DIAGNOSIS — E119 Type 2 diabetes mellitus without complications: Secondary | ICD-10-CM | POA: Diagnosis not present

## 2017-04-07 DIAGNOSIS — F250 Schizoaffective disorder, bipolar type: Secondary | ICD-10-CM | POA: Insufficient documentation

## 2017-04-07 HISTORY — PX: PORTACATH PLACEMENT: SHX2246

## 2017-04-07 LAB — GLUCOSE, CAPILLARY: Glucose-Capillary: 175 mg/dL — ABNORMAL HIGH (ref 65–99)

## 2017-04-07 SURGERY — INSERTION, TUNNELED CENTRAL VENOUS DEVICE, WITH PORT
Anesthesia: Monitor Anesthesia Care | Laterality: Left

## 2017-04-07 MED ORDER — FENTANYL CITRATE (PF) 100 MCG/2ML IJ SOLN
INTRAMUSCULAR | Status: AC
  Filled 2017-04-07: qty 2

## 2017-04-07 MED ORDER — CEFAZOLIN SODIUM-DEXTROSE 2-4 GM/100ML-% IV SOLN
2.0000 g | INTRAVENOUS | Status: AC
  Administered 2017-04-07: 2 g via INTRAVENOUS
  Filled 2017-04-07: qty 100

## 2017-04-07 MED ORDER — CHLORHEXIDINE GLUCONATE CLOTH 2 % EX PADS: 6.0000 | MEDICATED_PAD | Freq: Once | CUTANEOUS | Status: DC

## 2017-04-07 MED ORDER — HEPARIN SOD (PORK) LOCK FLUSH 100 UNIT/ML IV SOLN
INTRAVENOUS | Status: DC | PRN
  Administered 2017-04-07: 500 [IU] via INTRAVENOUS

## 2017-04-07 MED ORDER — PROPOFOL 10 MG/ML IV BOLUS
INTRAVENOUS | Status: AC
  Filled 2017-04-07: qty 20

## 2017-04-07 MED ORDER — HEPARIN SOD (PORK) LOCK FLUSH 100 UNIT/ML IV SOLN
INTRAVENOUS | Status: AC
  Filled 2017-04-07: qty 5

## 2017-04-07 MED ORDER — LACTATED RINGERS IV SOLN
INTRAVENOUS | Status: DC
  Administered 2017-04-07: 12:00:00 via INTRAVENOUS

## 2017-04-07 MED ORDER — CHLORHEXIDINE GLUCONATE CLOTH 2 % EX PADS
6.0000 | MEDICATED_PAD | Freq: Once | CUTANEOUS | Status: AC
  Administered 2017-04-07: 6 via TOPICAL

## 2017-04-07 MED ORDER — MIDAZOLAM HCL 5 MG/5ML IJ SOLN
INTRAMUSCULAR | Status: DC | PRN
  Administered 2017-04-07 (×2): 1 mg via INTRAVENOUS

## 2017-04-07 MED ORDER — OXYCODONE HCL 5 MG PO TABS: 5.0000 mg | ORAL_TABLET | ORAL | 0 refills | Status: DC | PRN

## 2017-04-07 MED ORDER — MIDAZOLAM HCL 2 MG/2ML IJ SOLN
INTRAMUSCULAR | Status: AC
  Filled 2017-04-07: qty 2

## 2017-04-07 MED ORDER — LIDOCAINE HCL (PF) 1 % IJ SOLN
INTRAMUSCULAR | Status: AC
  Filled 2017-04-07: qty 30

## 2017-04-07 MED ORDER — MIDAZOLAM HCL 2 MG/2ML IJ SOLN
1.0000 mg | INTRAMUSCULAR | Status: AC
  Administered 2017-04-07: 2 mg via INTRAVENOUS

## 2017-04-07 MED ORDER — FENTANYL CITRATE (PF) 100 MCG/2ML IJ SOLN
25.0000 ug | Freq: Once | INTRAMUSCULAR | Status: AC
  Administered 2017-04-07: 25 ug via INTRAVENOUS

## 2017-04-07 MED ORDER — SODIUM CHLORIDE 0.9 % IV SOLN
INTRAVENOUS | Status: AC | PRN
  Administered 2017-04-07: 10 mL via INTRAMUSCULAR

## 2017-04-07 MED ORDER — PROPOFOL 500 MG/50ML IV EMUL
INTRAVENOUS | Status: DC | PRN
  Administered 2017-04-07: 75 ug/kg/min via INTRAVENOUS

## 2017-04-07 MED ORDER — LIDOCAINE HCL (PF) 1 % IJ SOLN
INTRAMUSCULAR | Status: DC | PRN
  Administered 2017-04-07: 9 mL

## 2017-04-07 SURGICAL SUPPLY — 39 items
ADH SKN CLS APL DERMABOND .7 (GAUZE/BANDAGES/DRESSINGS) ×1
APPLIER CLIP 9.375 SM OPEN (CLIP)
APR CLP SM 9.3 20 MLT OPN (CLIP)
BAG DECANTER FOR FLEXI CONT (MISCELLANEOUS) ×3 IMPLANT
BAG HAMPER (MISCELLANEOUS) ×3 IMPLANT
CHLORAPREP W/TINT 10.5 ML (MISCELLANEOUS) ×3 IMPLANT
CLIP APPLIE 9.375 SM OPEN (CLIP) IMPLANT
CLOTH BEACON ORANGE TIMEOUT ST (SAFETY) ×3 IMPLANT
COVER LIGHT HANDLE STERIS (MISCELLANEOUS) ×6 IMPLANT
DECANTER SPIKE VIAL GLASS SM (MISCELLANEOUS) ×3 IMPLANT
DERMABOND ADVANCED (GAUZE/BANDAGES/DRESSINGS) ×2
DERMABOND ADVANCED .7 DNX12 (GAUZE/BANDAGES/DRESSINGS) ×1 IMPLANT
DRAPE C-ARM FOLDED MOBILE STRL (DRAPES) ×3 IMPLANT
ELECT REM PT RETURN 9FT ADLT (ELECTROSURGICAL) ×3
ELECTRODE REM PT RTRN 9FT ADLT (ELECTROSURGICAL) ×1 IMPLANT
GLOVE BIO SURGEON STRL SZ 6.5 (GLOVE) ×2 IMPLANT
GLOVE BIO SURGEONS STRL SZ 6.5 (GLOVE) ×1
GLOVE BIOGEL PI IND STRL 6.5 (GLOVE) ×1 IMPLANT
GLOVE BIOGEL PI IND STRL 7.0 (GLOVE) ×1 IMPLANT
GLOVE BIOGEL PI INDICATOR 6.5 (GLOVE) ×2
GLOVE BIOGEL PI INDICATOR 7.0 (GLOVE) ×6
GLOVE ECLIPSE 7.0 STRL STRAW (GLOVE) ×2 IMPLANT
GOWN STRL REUS W/TWL LRG LVL3 (GOWN DISPOSABLE) ×6 IMPLANT
IV NS 500ML (IV SOLUTION) ×3
IV NS 500ML BAXH (IV SOLUTION) ×1 IMPLANT
KIT PORT POWER 8FR ISP MRI (Port) ×3 IMPLANT
KIT ROOM TURNOVER APOR (KITS) ×3 IMPLANT
MANIFOLD NEPTUNE II (INSTRUMENTS) ×3 IMPLANT
NDL HYPO 25X1 1.5 SAFETY (NEEDLE) ×1 IMPLANT
NEEDLE HYPO 25X1 1.5 SAFETY (NEEDLE) ×3 IMPLANT
PACK MINOR (CUSTOM PROCEDURE TRAY) ×3 IMPLANT
PAD ARMBOARD 7.5X6 YLW CONV (MISCELLANEOUS) ×3 IMPLANT
SET BASIN LINEN APH (SET/KITS/TRAYS/PACK) ×3 IMPLANT
SUT MNCRL AB 4-0 PS2 18 (SUTURE) ×3 IMPLANT
SUT PROLENE 2 0 SH 30 (SUTURE) ×3 IMPLANT
SUT VIC AB 3-0 SH 27 (SUTURE) ×3
SUT VIC AB 3-0 SH 27X BRD (SUTURE) ×1 IMPLANT
SYR 20CC LL (SYRINGE) ×3 IMPLANT
SYR CONTROL 10ML LL (SYRINGE) ×3 IMPLANT

## 2017-04-07 NOTE — Op Note (Signed)
Operative Note 04/07/17   Preoperative Diagnosis: Esophageal cancer   Postoperative Diagnosis: Same   Procedure(s) Performed: Port-A-Cath placement, left subclavian    Surgeon: Lanell Matar. Constance Haw, MD   Assistants: No qualified resident was available   Anesthesia: Monitored anesthesia care   Anesthesiologist: Lerry Liner, MD    Specimens: None   Estimated Blood Loss: Minimal   Fluoroscopy time: 57 seconds   Blood Replacement: None    Complications: None    Operative Findings: normal anatomy   Indications: Mr. Tench is a 67 yo with newly diagnosed esophageal cancer who is going to need neoadjuvant treatment for his disease.  He was sent to me for port placement, and will be seeing thoracic surgery next week. After a discussion of the risk and benefits including but not limited to bleeding, infection, malfunctioning port, pneumothorax, the patient and his power of attorney opted to proceed.    Procedure: The patient was brought into the operating room and monitored anesthesia care was induced.  One percent lidocaine was used for local anesthesia.   The left chest and neck was prepped and draped in the usual sterile fashion.  Preoperative antibiotics were given.  An incision was made below the left clavicle. A subcutaneous pocket was formed. The needles advanced into the left subclavian vein using the Seldinger technique without difficulty. A guidewire was then advanced into the right atrium under fluoroscopic guidance.  Ectopia was noted. An introducer and peel-away sheath were placed over the guidewire. The catheter was then inserted through the peel-away sheath and the peel-away sheath was removed.  A spot film was performed to confirm the position. The catheter was then attached to the port and the port placed in subcutaneous pocket. Adequate positioning was confirmed by fluoroscopy. Hemostasis was confirmed, and the port was secured with 2-0 prolene sutures.  Good backflow of  blood was noted on aspiration of the port. The port was flushed with heparin flush. Subcutaneous layer was reapproximated using a 3-0 Vicryl interrupted suture. The skin was closed using a 4-0 Vicryl subcuticular suture. Dermabond was applied.  All tape and needle counts were correct at the end of the procedure. The patient was transferred to PACU in stable condition. A chest x-ray will be performed at that time.  Curlene Labrum, MD [facility] [address] [place], [place] [address] [phone] (office)

## 2017-04-07 NOTE — Interval H&P Note (Signed)
History and Physical Interval Note:  04/07/2017 2:04 PM  [name]  has presented today for surgery, with the diagnosis of esophageal carcinoma  The various methods of treatment have been discussed with the patient and family. After consideration of risks, benefits and other options for treatment, the patient has consented to  Procedure(s): INSERTION PORT-A-CATH (N/A) as a surgical intervention .  The patient's history has been reviewed, patient examined, no change in status, stable for surgery.  I have reviewed the patient's chart and labs.  Questions were answered to the patient's satisfaction.    No additional questions. Plan for port placement on left, given that he is right handed.  Virl Cagey

## 2017-04-07 NOTE — Anesthesia Postprocedure Evaluation (Signed)
Anesthesia Post Note  Patient: [name]  Procedure(s) Performed: INSERTION PORT-A-CATH LEFT SUBCLAVIAN (Left )  Patient location during evaluation: PACU Anesthesia Type: MAC Level of consciousness: awake and alert, oriented and patient cooperative Pain management: pain level controlled Vital Signs Assessment: post-procedure vital signs reviewed and stable Respiratory status: spontaneous breathing and patient connected to nasal cannula oxygen Cardiovascular status: stable Postop Assessment: no apparent nausea or vomiting Anesthetic complications: no     Last Vitals:  Vitals:   04/07/17 1410 04/07/17 1415  BP: 123/77 107/72  Pulse:    Resp: 14 13  Temp:    SpO2: 95% 95%    Last Pain:  Vitals:   04/07/17 1131  TempSrc: Oral                 [name]

## 2017-04-07 NOTE — Anesthesia Procedure Notes (Signed)
Procedure Name: MAC Date/Time: 04/07/2017 2:20 PM Performed by: Andree Elk Amy A, CRNA Pre-anesthesia Checklist: Patient identified, Timeout performed, Emergency Drugs available, Suction available and Patient being monitored Oxygen Delivery Method: Simple face mask

## 2017-04-07 NOTE — Anesthesia Preprocedure Evaluation (Signed)
Anesthesia Evaluation  Patient identified by MRN, date of birth, ID band Patient awake    Reviewed: Allergy & Precautions, H&P , NPO status , Patient's Chart, lab work & pertinent test results  Airway Mallampati: II  TM Distance: >3 FB     Dental  (+) Teeth Intact   Pulmonary sleep apnea ,    breath sounds clear to auscultation       Cardiovascular hypertension, Pt. on medications + CAD, + Past MI and +CHF   Rhythm:Regular Rate:Normal     Neuro/Psych PSYCHIATRIC DISORDERS ( Schizoaffective disorder) Bipolar Disorder    GI/Hepatic GERD  Controlled,  Endo/Other  diabetes, Type 2  Renal/GU Renal disease     Musculoskeletal  (+) Arthritis ,   Abdominal   Peds  Hematology   Anesthesia Other Findings   Reproductive/Obstetrics                             Anesthesia Physical Anesthesia Plan  ASA: III  Anesthesia Plan: MAC   Post-op Pain Management:    Induction: Intravenous  PONV Risk Score and Plan:   Airway Management Planned: Simple Face Mask  Additional Equipment:   Intra-op Plan:   Post-operative Plan:   Informed Consent: I have reviewed the patients History and Physical, chart, labs and discussed the procedure including the risks, benefits and alternatives for the proposed anesthesia with the patient or authorized representative who has indicated his/her understanding and acceptance.     Plan Discussed with:   Anesthesia Plan Comments:         Anesthesia Quick Evaluation

## 2017-04-07 NOTE — Discharge Instructions (Signed)
Discharge Instructions: Shower per your regular routine. Take tylenol and ibuprofen as needed for pain control, alternating every 4-6 hours.  Take Roxicodone for breakthrough pain. Take colace for constipation related to narcotic pain medication. Do not pick at the dermabond glue on your incision sites.   Implanted [facility] Guide An implanted port is a type of central line that is placed under the skin. Central lines are used to provide IV access when treatment or nutrition needs to be given through a persons veins. Implanted ports are used for long-term IV access. An implanted port may be placed because:  You need IV medicine that would be irritating to the small veins in your hands or arms.  You need long-term IV medicines, such as antibiotics.  You need IV nutrition for a long period.  You need frequent blood draws for lab tests.  You need dialysis.  Implanted ports are usually placed in the chest area, but they can also be placed in the upper arm, the abdomen, or the leg. An implanted port has two main parts:  Reservoir. The reservoir is round and will appear as a small, raised area under your skin. The reservoir is the part where a needle is inserted to give medicines or draw blood.  Catheter. The catheter is a thin, flexible tube that extends from the reservoir. The catheter is placed into a large vein. Medicine that is inserted into the reservoir goes into the catheter and then into the vein.  How will I care for my incision site? Do not get the incision site wet. Bathe or shower as directed by your health care provider. How is my port accessed? Special steps must be taken to access the port:  Before the port is accessed, a numbing cream can be placed on the skin. This helps numb the skin over the port site.  Your health care provider uses a sterile technique to access the port. ? Your health care provider must put on a mask and sterile gloves. ? The skin over your port is  cleaned carefully with an antiseptic and allowed to dry. ? The port is gently pinched between sterile gloves, and a needle is inserted into the port.  Only "non-coring" port needles should be used to access the port. Once the port is accessed, a blood return should be checked. This helps ensure that the port is in the vein and is not clogged.  If your port needs to remain accessed for a constant infusion, a clear (transparent) bandage will be placed over the needle site. The bandage and needle will need to be changed every week, or as directed by your health care provider.  Keep the bandage covering the needle clean and dry. Do not get it wet. Follow your health care providers instructions on how to take a shower or bath while the port is accessed.  If your port does not need to stay accessed, no bandage is needed over the port.  What is flushing? Flushing helps keep the port from getting clogged. Follow your health care providers instructions on how and when to flush the port. Ports are usually flushed with saline solution or a medicine called heparin. The need for flushing will depend on how the port is used.  If the port is used for intermittent medicines or blood draws, the port will need to be flushed: ? After medicines have been given. ? After blood has been drawn. ? As part of routine maintenance.  If a constant infusion  is running, the port may not need to be flushed.  How long will my port stay implanted? The port can stay in for as long as your health care provider thinks it is needed. When it is time for the port to come out, surgery will be done to remove it. The procedure is similar to the one performed when the port was put in. When should I seek immediate medical care? When you have an implanted port, you should seek immediate medical care if:  You notice a bad smell coming from the incision site.  You have swelling, redness, or drainage at the incision site.  You have  more swelling or pain at the port site or the surrounding area.  You have a fever that is not controlled with medicine.  This information is not intended to replace advice given to you by your health care provider. Make sure you discuss any questions you have with your health care provider. Document Released: 02/02/2005 Document Revised: 07/11/2015 Document Reviewed: 10/10/2012 Elsevier Interactive Patient Education  2017 [place], Care After These instructions provide you with information about caring for yourself after your procedure. Your health care provider may also give you more specific instructions. Your treatment has been planned according to current medical practices, but problems sometimes occur. Call your health care provider if you have any problems or questions after your procedure. What can I expect after the procedure? After your procedure, it is common to:  Feel sleepy for several hours.  Feel clumsy and have poor balance for several hours.  Feel forgetful about what happened after the procedure.  Have poor judgment for several hours.  Feel nauseous or vomit.  Have a sore throat if you had a breathing tube during the procedure.  Follow these instructions at home: For at least 24 hours after the procedure:   Do not: ? Participate in activities in which you could fall or become injured. ? Drive. ? Use heavy machinery. ? Drink alcohol. ? Take sleeping pills or medicines that cause drowsiness. ? Make important decisions or sign legal documents. ? Take care of children on your own.  Rest. Eating and drinking  Follow the diet that is recommended by your health care provider.  If you vomit, drink water, juice, or soup when you can drink without vomiting.  Make sure you have little or no nausea before eating solid foods. General instructions  Have a responsible adult stay with you until you are awake and alert.  Take  over-the-counter and prescription medicines only as told by your health care provider.  If you smoke, do not smoke without supervision.  Keep all follow-up visits as told by your health care provider. This is important. Contact a health care provider if:  You keep feeling nauseous or you keep vomiting.  You feel light-headed.  You develop a rash.  You have a fever. Get help right away if:  You have trouble breathing. This information is not intended to replace advice given to you by your health care provider. Make sure you discuss any questions you have with your health care provider. Document Released: 05/26/2015 Document Revised: 09/25/2015 Document Reviewed: 05/26/2015 Elsevier Interactive Patient Education  [employer].

## 2017-04-07 NOTE — Anesthesia Procedure Notes (Signed)
Performed by: Andree Elk Amy A, CRNA Oxygen Delivery Method: Nasal cannula

## 2017-04-07 NOTE — Transfer of Care (Signed)
Immediate Anesthesia Transfer of Care Note  Patient: [name]  Procedure(s) Performed: INSERTION PORT-A-CATH LEFT SUBCLAVIAN (Left )  Patient Location: PACU  Anesthesia Type:MAC  Level of Consciousness: awake, alert , oriented and patient cooperative  Airway & Oxygen Therapy: Patient Spontanous Breathing and Patient connected to nasal cannula oxygen  Post-op Assessment: Report given to RN and Post -op Vital signs reviewed and stable  Post vital signs: Reviewed and stable  Last Vitals:  Vitals:   04/07/17 1410 04/07/17 1415  BP: 123/77 107/72  Pulse:    Resp: 14 13  Temp:    SpO2: 95% 95%    Last Pain:  Vitals:   04/07/17 1131  TempSrc: Oral         Complications: No apparent anesthesia complications

## 2017-04-08 ENCOUNTER — Ambulatory Visit (HOSPITAL_COMMUNITY)
Admission: RE | Admit: 2017-04-08 | Discharge: 2017-04-08 | Disposition: A | Discharge status: Home / Self Care | Payer: Medicare Other | Source: Ambulatory Visit | Attending: Gastroenterology | Admitting: Gastroenterology

## 2017-04-08 ENCOUNTER — Ambulatory Visit (HOSPITAL_COMMUNITY): Payer: Medicare Other | Admitting: Anesthesiology

## 2017-04-08 ENCOUNTER — Encounter (HOSPITAL_COMMUNITY)
Admission: RE | Disposition: A | Discharge status: Home / Self Care | Payer: Self-pay | Source: Ambulatory Visit | Attending: Gastroenterology

## 2017-04-08 ENCOUNTER — Encounter (HOSPITAL_COMMUNITY): Payer: Self-pay

## 2017-04-08 DIAGNOSIS — Z7984 Long term (current) use of oral hypoglycemic drugs: Secondary | ICD-10-CM | POA: Insufficient documentation

## 2017-04-08 DIAGNOSIS — I2510 Atherosclerotic heart disease of native coronary artery without angina pectoris: Secondary | ICD-10-CM | POA: Insufficient documentation

## 2017-04-08 DIAGNOSIS — I130 Hypertensive heart and chronic kidney disease with heart failure and stage 1 through stage 4 chronic kidney disease, or unspecified chronic kidney disease: Secondary | ICD-10-CM | POA: Diagnosis not present

## 2017-04-08 DIAGNOSIS — C159 Malignant neoplasm of esophagus, unspecified: Secondary | ICD-10-CM | POA: Diagnosis not present

## 2017-04-08 DIAGNOSIS — K219 Gastro-esophageal reflux disease without esophagitis: Secondary | ICD-10-CM | POA: Insufficient documentation

## 2017-04-08 DIAGNOSIS — Z86010 Personal history of colonic polyps: Secondary | ICD-10-CM | POA: Insufficient documentation

## 2017-04-08 DIAGNOSIS — L0390 Cellulitis, unspecified: Secondary | ICD-10-CM | POA: Diagnosis not present

## 2017-04-08 DIAGNOSIS — C160 Malignant neoplasm of cardia: Secondary | ICD-10-CM | POA: Diagnosis not present

## 2017-04-08 DIAGNOSIS — F250 Schizoaffective disorder, bipolar type: Secondary | ICD-10-CM | POA: Insufficient documentation

## 2017-04-08 DIAGNOSIS — G4733 Obstructive sleep apnea (adult) (pediatric): Secondary | ICD-10-CM | POA: Insufficient documentation

## 2017-04-08 DIAGNOSIS — R011 Cardiac murmur, unspecified: Secondary | ICD-10-CM | POA: Diagnosis not present

## 2017-04-08 DIAGNOSIS — Z87442 Personal history of urinary calculi: Secondary | ICD-10-CM | POA: Insufficient documentation

## 2017-04-08 DIAGNOSIS — N189 Chronic kidney disease, unspecified: Secondary | ICD-10-CM | POA: Insufficient documentation

## 2017-04-08 DIAGNOSIS — K228 Other specified diseases of esophagus: Secondary | ICD-10-CM | POA: Diagnosis not present

## 2017-04-08 DIAGNOSIS — E1122 Type 2 diabetes mellitus with diabetic chronic kidney disease: Secondary | ICD-10-CM | POA: Diagnosis not present

## 2017-04-08 DIAGNOSIS — K2950 Unspecified chronic gastritis without bleeding: Secondary | ICD-10-CM | POA: Diagnosis not present

## 2017-04-08 DIAGNOSIS — I252 Old myocardial infarction: Secondary | ICD-10-CM | POA: Insufficient documentation

## 2017-04-08 DIAGNOSIS — I5042 Chronic combined systolic (congestive) and diastolic (congestive) heart failure: Secondary | ICD-10-CM | POA: Diagnosis not present

## 2017-04-08 DIAGNOSIS — E7800 Pure hypercholesterolemia, unspecified: Secondary | ICD-10-CM | POA: Diagnosis not present

## 2017-04-08 DIAGNOSIS — M1990 Unspecified osteoarthritis, unspecified site: Secondary | ICD-10-CM | POA: Insufficient documentation

## 2017-04-08 DIAGNOSIS — Z885 Allergy status to narcotic agent status: Secondary | ICD-10-CM | POA: Diagnosis not present

## 2017-04-08 DIAGNOSIS — R1310 Dysphagia, unspecified: Secondary | ICD-10-CM | POA: Diagnosis not present

## 2017-04-08 DIAGNOSIS — Z79899 Other long term (current) drug therapy: Secondary | ICD-10-CM | POA: Diagnosis not present

## 2017-04-08 DIAGNOSIS — Z7982 Long term (current) use of aspirin: Secondary | ICD-10-CM | POA: Insufficient documentation

## 2017-04-08 DIAGNOSIS — Z538 Procedure and treatment not carried out for other reasons: Secondary | ICD-10-CM | POA: Diagnosis not present

## 2017-04-08 DIAGNOSIS — C155 Malignant neoplasm of lower third of esophagus: Secondary | ICD-10-CM | POA: Diagnosis not present

## 2017-04-08 HISTORY — PX: EUS: SHX5427

## 2017-04-08 LAB — GLUCOSE, CAPILLARY: Glucose-Capillary: 186 mg/dL — ABNORMAL HIGH (ref 65–99)

## 2017-04-08 SURGERY — UPPER ENDOSCOPIC ULTRASOUND (EUS) RADIAL
Anesthesia: Monitor Anesthesia Care

## 2017-04-08 MED ORDER — PHENYLEPHRINE 40 MCG/ML (10ML) SYRINGE FOR IV PUSH (FOR BLOOD PRESSURE SUPPORT)
PREFILLED_SYRINGE | INTRAVENOUS | Status: DC | PRN
  Administered 2017-04-08 (×2): 40 ug via INTRAVENOUS

## 2017-04-08 MED ORDER — LIDOCAINE 2% (20 MG/ML) 5 ML SYRINGE
INTRAMUSCULAR | Status: DC | PRN
  Administered 2017-04-08: 50 mg via INTRAVENOUS

## 2017-04-08 MED ORDER — LACTATED RINGERS IV SOLN
INTRAVENOUS | Status: DC
  Administered 2017-04-08: 1000 mL via INTRAVENOUS

## 2017-04-08 MED ORDER — SODIUM CHLORIDE 0.9 % IV SOLN: INTRAVENOUS | Status: DC

## 2017-04-08 MED ORDER — PROPOFOL 10 MG/ML IV BOLUS
INTRAVENOUS | Status: AC
  Filled 2017-04-08: qty 40

## 2017-04-08 MED ORDER — PROPOFOL 500 MG/50ML IV EMUL
INTRAVENOUS | Status: DC | PRN
  Administered 2017-04-08: 100 ug/kg/min via INTRAVENOUS

## 2017-04-08 MED ORDER — PROPOFOL 10 MG/ML IV BOLUS
INTRAVENOUS | Status: DC | PRN
  Administered 2017-04-08: 20 mg via INTRAVENOUS
  Administered 2017-04-08: 40 mg via INTRAVENOUS
  Administered 2017-04-08: 20 mg via INTRAVENOUS

## 2017-04-08 NOTE — Op Note (Signed)
[facility] Patient Name: [name] Procedure Date: 04/08/2017 MRN: [id number] Attending MD: Milus Banister , MD Date of Birth: [birth year] CSN: [id number] Age: 67 Admit Type: Outpatient Procedure:                Upper EUS Indications:              Pre-treatment staging of GE junction adenocarcinoma                            (recently diagnosed by Dr. Oneida Alar). No sign of                            metastatic disease on staging CTs. Providers:                Milus Banister, MD, Cleda Daub, RN, Laurena Spies, Technician Referring MD:             Kyung Rudd, MD Medicines:                Monitored Anesthesia Care Complications:            No immediate complications. Estimated blood loss:                            None. Estimated Blood Loss:     Estimated blood loss: none. Procedure:                Pre-Anesthesia Assessment:                           - Prior to the procedure, a History and Physical                            was performed, and patient medications and                            allergies were reviewed. The patient's tolerance of                            previous anesthesia was also reviewed. The risks                            and benefits of the procedure and the sedation                            options and risks were discussed with the patient.                            All questions were answered, and informed consent                            was obtained. Prior Anticoagulants: The patient has  taken no previous anticoagulant or antiplatelet                            agents. ASA Grade Assessment: II - A patient with                            mild systemic disease. After reviewing the risks                            and benefits, the patient was deemed in                            satisfactory condition to undergo the procedure.                           After obtaining  informed consent, the endoscope was                            passed under direct vision. Throughout the                            procedure, the patient's blood pressure, pulse, and                            oxygen saturations were monitored continuously. The                            [id number] ([id number]) scope was introduced through                            the mouth, and advanced to the antrum of the                            stomach. The upper EUS was accomplished without                            difficulty. The patient tolerated the procedure                            well. Scope In: Scope Out: Findings:      Endoscopic Finding :      1. Friable, nearly obstructing, large mass, circumferential (at most       distal aspect) with proximal edge located 32cm from the incisors and       distal edge located at 39cm (just below the GE junction). I was able to       advance the large diameter (1cm) radial echoendoscope throught the       malignant stricture with minor resistence.      2. 1.5cm firm nodule, ulcerated, clearly malignant, located 2cm distal       to the GE junction in the cardia of the stomach.      3. Otherwise normal examination.      Endosonographic Finding :      1. The GE junction mass described above correlated with a bulky,  hypoechoic, heterogeneous mas that clearly passed into and through the       muscularis proprial layer (at the distal site of circumferential       growth). (uT3).      2. I could not evaluate the satellite lesion due to it's very proximal       gastric location.      3. No paraesophaegal adenopathy. (uN0).      4. Limited views of liver, pancreas were normal. Impression:               - 7cm long, bulky, focally circumferential                            (distally), uT3N0M0 (Stage II) GE junction                            adenocarcinoma with nearby satellite lesion 2cm                            distal to the GE junction.                            - He will likely benefit from neo-adjuvant                            chemo/XRT. Moderate Sedation:      N/A- Per Anesthesia Care Recommendation:           - Discharge patient to home (ambulatory). Procedure Code(s):        --- Professional ---                           [phone], 1, Esophagogastroduodenoscopy, flexible,                            transoral; with endoscopic ultrasound examination                            limited to the esophagus, stomach or duodenum, and                            adjacent structures Diagnosis Code(s):        --- Professional ---                           K22.8, Other specified diseases of esophagus                           C15.9, Malignant neoplasm of esophagus, unspecified CPT copyright 2016 American Medical Association. All rights reserved. The codes documented in this report are preliminary and upon coder review may  be revised to meet current compliance requirements. Milus Banister, MD 04/08/2017 9:02:23 AM This report has been signed electronically. Number of Addenda: 0

## 2017-04-08 NOTE — Transfer of Care (Signed)
Immediate Anesthesia Transfer of Care Note  Patient: [name]  Procedure(s) Performed: Procedure(s): UPPER ENDOSCOPIC ULTRASOUND (EUS) RADIAL (N/A)  Patient Location: PACU  Anesthesia Type:MAC  Level of Consciousness:  sedated, patient cooperative and responds to stimulation  Airway & Oxygen Therapy:Patient Spontanous Breathing and Patient connected to face mask oxgen  Post-op Assessment:  Report given to PACU RN and Post -op Vital signs reviewed and stable  Post vital signs:  Reviewed and stable  Last Vitals:  Vitals:   04/08/17 0747  BP: 126/84  Pulse: 77  Resp: 12  Temp: 36.6 C  SpO2: 42%    Complications: No apparent anesthesia complications

## 2017-04-08 NOTE — Anesthesia Postprocedure Evaluation (Signed)
Anesthesia Post Note  Patient: [name]  Procedure(s) Performed: UPPER ENDOSCOPIC ULTRASOUND (EUS) RADIAL (N/A )     Patient location during evaluation: PACU Anesthesia Type: MAC Level of consciousness: awake and alert Pain management: pain level controlled Vital Signs Assessment: post-procedure vital signs reviewed and stable Respiratory status: spontaneous breathing, nonlabored ventilation and respiratory function stable Cardiovascular status: stable and blood pressure returned to baseline Postop Assessment: no apparent nausea or vomiting Anesthetic complications: no    Last Vitals:  Vitals:   04/08/17 0910 04/08/17 0920  BP: 100/81 125/81  Pulse: 72 67  Resp: 13 13  Temp:    SpO2: 97% 98%    Last Pain:  Vitals:   04/08/17 0858  TempSrc: Oral                 Jhaniya Briski,W. EDMOND

## 2017-04-08 NOTE — Interval H&P Note (Signed)
History and Physical Interval Note:  04/08/2017 7:31 AM  [name]  has presented today for surgery, with the diagnosis of esophageal cancer staging  The various methods of treatment have been discussed with the patient and family. After consideration of risks, benefits and other options for treatment, the patient has consented to  Procedure(s): UPPER ENDOSCOPIC ULTRASOUND (EUS) RADIAL (N/A) as a surgical intervention .  The patient's history has been reviewed, patient examined, no change in status, stable for surgery.  I have reviewed the patient's chart and labs.  Questions were answered to the patient's satisfaction.     Milus Banister

## 2017-04-08 NOTE — Discharge Instructions (Signed)
YOU HAD AN ENDOSCOPIC PROCEDURE TODAY: Refer to the procedure report and other information in the discharge instructions given to you for any specific questions about what was found during the examination. If this information does not answer your questions, please call [place] office at [phone] to clarify.   YOU SHOULD EXPECT: Some feelings of bloating in the abdomen. Passage of more gas than usual. Walking can help get rid of the air that was put into your GI tract during the procedure and reduce the bloating. If you had a lower endoscopy (such as a colonoscopy or flexible sigmoidoscopy) you may notice spotting of blood in your stool or on the toilet paper. Some abdominal soreness may be present for a day or two, also.  DIET: Your first meal following the procedure should be a light meal and then it is ok to progress to your normal diet. A half-sandwich or bowl of soup is an example of a good first meal. Heavy or fried foods are harder to digest and may make you feel nauseous or bloated. Drink plenty of fluids but you should avoid alcoholic beverages for 24 hours. If you had a esophageal dilation, please see attached instructions for diet.    ACTIVITY: Your care partner should take you home directly after the procedure. You should plan to take it easy, moving slowly for the rest of the day. You can resume normal activity the day after the procedure however YOU SHOULD NOT DRIVE, use power tools, machinery or perform tasks that involve climbing or major physical exertion for 24 hours (because of the sedation medicines used during the test).   SYMPTOMS TO REPORT IMMEDIATELY: A gastroenterologist can be reached at any hour. Please call [phone]  for any of the following symptoms:   Following upper endoscopy (EGD, EUS, ERCP, esophageal dilation) Vomiting of blood or coffee ground material  New, significant abdominal pain  New, significant chest pain or pain under the shoulder blades  Painful or  persistently difficult swallowing  New shortness of breath  Black, tarry-looking or red, bloody stools  FOLLOW UP:  If any biopsies were taken you will be contacted by phone or by letter within the next 1-3 weeks. Call [phone]  if you have not heard about the biopsies in 3 weeks.  Please also call with any specific questions about appointments or follow up tests.  

## 2017-04-08 NOTE — Anesthesia Preprocedure Evaluation (Addendum)
Anesthesia Evaluation  Patient identified by MRN, date of birth, ID band Patient awake    Reviewed: Allergy & Precautions, H&P , NPO status , Patient's Chart, lab work & pertinent test results  Airway Mallampati: III  TM Distance: >3 FB Neck ROM: Full    Dental no notable dental hx. (+) Teeth Intact, Dental Advisory Given   Pulmonary sleep apnea ,    Pulmonary exam normal breath sounds clear to auscultation       Cardiovascular hypertension, + CAD and +CHF  + Valvular Problems/Murmurs AS  Rhythm:Regular Rate:Normal     Neuro/Psych PSYCHIATRIC DISORDERS Bipolar Disorder Schizophrenia negative neurological ROS     GI/Hepatic Neg liver ROS, GERD  Medicated and Controlled,  Endo/Other  diabetes, Type 2, Oral Hypoglycemic Agents  Renal/GU Renal InsufficiencyRenal disease  negative genitourinary   Musculoskeletal  (+) Arthritis , Osteoarthritis,    Abdominal   Peds  Hematology negative hematology ROS (+)   Anesthesia Other Findings   Reproductive/Obstetrics negative OB ROS                            Anesthesia Physical Anesthesia Plan  ASA: III  Anesthesia Plan: MAC   Post-op Pain Management:    Induction: Intravenous  PONV Risk Score and Plan: 1 and Propofol infusion and Treatment may vary due to age or medical condition  Airway Management Planned: Nasal Cannula  Additional Equipment:   Intra-op Plan:   Post-operative Plan:   Informed Consent: I have reviewed the patients History and Physical, chart, labs and discussed the procedure including the risks, benefits and alternatives for the proposed anesthesia with the patient or authorized representative who has indicated his/her understanding and acceptance.   Dental advisory given  Plan Discussed with: CRNA  Anesthesia Plan Comments:         Anesthesia Quick Evaluation

## 2017-04-09 ENCOUNTER — Encounter (HOSPITAL_COMMUNITY): Payer: Self-pay | Admitting: General Surgery

## 2017-04-10 DIAGNOSIS — C159 Malignant neoplasm of esophagus, unspecified: Secondary | ICD-10-CM | POA: Diagnosis not present

## 2017-04-11 ENCOUNTER — Ambulatory Visit: Payer: Medicare Other | Admitting: Radiation Oncology

## 2017-04-12 ENCOUNTER — Encounter: Payer: Self-pay | Admitting: Radiation Oncology

## 2017-04-14 ENCOUNTER — Encounter: Payer: Self-pay | Admitting: Cardiothoracic Surgery

## 2017-04-14 ENCOUNTER — Telehealth (HOSPITAL_COMMUNITY): Payer: Self-pay | Admitting: Emergency Medicine

## 2017-04-14 DIAGNOSIS — Z510 Encounter for antineoplastic radiation therapy: Secondary | ICD-10-CM | POA: Diagnosis not present

## 2017-04-14 DIAGNOSIS — C160 Malignant neoplasm of cardia: Secondary | ICD-10-CM | POA: Diagnosis not present

## 2017-04-14 NOTE — Telephone Encounter (Signed)
Called Langley Gauss to let her know that we moved the follow up appt to after the pt has seen Dr Servando Snare on 3/11.  The pts new appt to see Dr Raliegh Ip is on 3/13 at 3:20 pm.  Explained I would get in touch with [place] to see about moving radiation until 3/18 to start chemo/radiation to that date.  She verbalized understanding.

## 2017-04-15 ENCOUNTER — Encounter (HOSPITAL_COMMUNITY): Payer: Self-pay

## 2017-04-15 ENCOUNTER — Ambulatory Visit (HOSPITAL_COMMUNITY): Payer: Self-pay | Admitting: Internal Medicine

## 2017-04-15 ENCOUNTER — Encounter (HOSPITAL_COMMUNITY): Payer: Self-pay | Admitting: Adult Health

## 2017-04-15 ENCOUNTER — Telehealth (HOSPITAL_COMMUNITY): Payer: Self-pay | Admitting: Emergency Medicine

## 2017-04-15 NOTE — Progress Notes (Signed)
[facility] testing results reviewed and are noted in summary below .   MS-Stable TMB-Intermediate      Mike Craze, NP [place] [phone]

## 2017-04-15 NOTE — Patient Instructions (Signed)
[name]   CHEMOTHERAPY INSTRUCTIONS  We are going to treat you with carboplatin and taxol weekly.  You will see the doctor regularly throughout treatment.  We monitor your lab work prior to every treatment.  The doctor monitors your response to treatment by the way you are feeling and your blood work.  There will be wait times while you are here for treatment.  It will take about 30 minutes to 1 hour for your lab work to result.  Then the pharmacy has to mix your medications and bring them to the clinic.    You will have the following premedications prior to receiving chemotherapy: Premeds: Benadryl:  Help prevent a reaction to the chemotherapy.  Pepcid: antihistamine premed Aloxi - high powered nausea/vomiting prevention medication used for chemotherapy patients.  Dexamethasone - steroid - given to reduce the risk of you having an allergic type reaction to the chemotherapy. Dex can cause you to feel energized, nervous/anxious/jittery, make you have trouble sleeping, and/or make you feel hot/flushed in the face/neck and/or look pink/red in the face/neck. These side effects will pass as the Dex wears off. (takes 20 minutes to infuse)     POTENTIAL SIDE EFFECTS OF TREATMENT:  Carboplatin (Generic Name) Other Names: Paraplatin, CBDCA  About This Drug Carboplatin is a drug used to treat cancer. This drug is given in the vein (IV). This drug takes about 30 minutes to infuse.   Possible Side Effects (More Common) . Nausea and throwing up (vomiting). These symptoms may happen within a few hours after your treatment and may last up to 24 hours. Medicines are available to stop or lessen these side effects. . Bone marrow depression. This is a decrease in the number of white blood cells, red blood cells, and platelets. This may raise your risk of infection, make you tired and weak (fatigue), and raise your risk of bleeding. . Soreness of the mouth and throat. You  may have red areas, white patches, or sores that hurt. . This drug may affect how your kidneys work. Your kidney function will be checked as needed. . Electrolyte changes. Your blood will be checked for electrolyte changes as needed.  Side Effects (Less Common) . Hair loss. Some patients lose their hair on the scalp and body. You may notice your hair thinning seven to 14 days after getting this drug. . Effects on the nerves are called peripheral neuropathy. You may feel numbness, tingling, or pain in your hands and feet. It may be hard for you to button your clothes, open jars, or walk as usual. The effect on the nerves may get worse with more doses of the drug. These effects get better in some people after the drug is stopped but it does not get better in all people. . Loose bowel movements (diarrhea) that may last for several days . Decreased hearing or ringing in the ears . Changes in the way food and drinks taste . Changes in liver function. Your liver function will be checked as needed.  Allergic Reactions Serious allergic reactions including anaphylaxis are rare. While you are getting this drug in your vein (IV), tell your nurse right away if you have any of these symptoms of an allergic reaction: . Trouble catching your breath . Feeling like your tongue or throat are swelling . Feeling your heart beat quickly or in a not normal way (palpitations) . Feeling dizzy or lightheaded . Flushing, itching, rash, and/or hives  Treating Side Effects .  Drink 6-8 cups of fluids each day unless your doctor has told you to limit your fluid intake due to some other health problem. A cup is 8 ounces of fluid. If you throw up or have loose bowel movements, you should drink more fluids so that you do not become dehydrated (lack water in the body from losing too much fluid). . Mouth care is very important. Your mouth care should consist of routine, gentle cleaning of your teeth or dentures and rinsing your  mouth with a mixture of 1/2 teaspoon of salt in 8 ounces of water or  teaspoon of baking soda in 8 ounces of water. This should be done at least after each meal and at bedtime. . If you have mouth sores, avoid mouthwash that has alcohol. Avoid alcohol and smoking because they can bother your mouth and throat. . If you have numbness and tingling in your hands and feet, be careful when cooking, walking, and handling sharp objects and hot liquids. . Talk with your nurse about getting a wig before you lose your hair. Also, call the [place] at 800-ACS-2345 to find out information about the "Look Good, Feel Better" program close to where you live. It is a free program where women getting chemotherapy can learn about wigs, turbans and scarves as well as makeup techniques and skin and nail care.  Food and Drug Interactions There are no known interactions of carboplatin with food. This drug may interact with other medicines. Tell your doctor and pharmacist about all the medicines and dietary supplements (vitamins, minerals, herbs and others) that you are taking at this time. The safety and use of dietary supplements and alternative diets are often not known. Using these might affect your cancer or interfere with your treatment. Until more is known, you should not use dietary supplements or alternative diets without your doctor's help.  When to Call the Doctor Call your doctor or nurse right away if you have any of these symptoms: . Fever of 100.5 F (38 C) or above; chills . Bleeding or bruising that is not normal . Wheezing or trouble breathing . Nausea that stops you from eating or drinking . Throwing up more than once a day . Rash or itching . Loose bowel movements (diarrhea) more than four times a day or diarrhea with weakness or feeling lightheaded . Call your doctor or nurse as soon as possible if any of these symptoms happen: . Numbness, tingling, decreased feeling or weakness in  fingers, toes, arms, or legs . Change in hearing, ringing in the ears . Blurred vision or other changes in eyesight . Decreased urine . Yellowing of skin or eyes  Problems and Reproductive Concerns Sexual problems and reproduction concerns may happen. In both men and women, this drug may affect your ability to have children. This cannot be determined before your treatment. Talk with your doctor or nurse if you plan to have children. Ask for information on sperm or egg banking. In men, this drug may interfere with your ability to make sperm, but it should not change your ability to have sexual relations. In women, menstrual bleeding may become irregular or stop while you are getting this drug. Do not assume that you cannot become pregnant if you do not have a menstrual period. Women may go through signs of menopause (change of life) like vaginal dryness or itching. Vaginal lubricants can be used to lessen vaginal dryness, itching, and pain during sexual relations. Genetic counseling is available for  you to talk about the effects of this drug therapy on future pregnancies. Also, a genetic counselor can look at the possible risk of problems in the unborn baby due to this medicine if an exposure happens during pregnancy. . Pregnancy warning: This drug may have harmful effects on the unborn child, so effective methods of birth control should be used during your cancer treatment. . Breast feeding warning: It is not known if this drug passes into breast milk. For this reason, women should talk to their doctor about the risks and benefits of breast feeding during treatment with this drug because this drug may enter the breast milk and badly harm a breast feeding baby.  Paclitaxel (Taxol)  Paclitaxel is a drug used to treat cancer. It is given in the vein (IV).  This will take 1 hour to infuse.  The first time this drug is given it will take longer to infuse because it is increased slowly to monitor for  reactions.  The nurse will be in the room with you for the first 15 minutes.   Possible Side Effects . Hair loss. Hair loss is often temporary, although with certain medicine, hair loss can sometimes be permanent. Hair loss may happen suddenly or gradually. If you lose hair, you may lose it from your head, face, armpits, pubic area, chest, and/or legs. You may also notice your hair getting thin. . Swelling of your legs, ankles and/or feet (edema) . Flushing . Nausea and throwing up (vomiting) . Loose bowel movements (diarrhea) . Bone marrow depression. This is a decrease in the number of white blood cells, red blood cells, and platelets. This may raise your risk of infection, make you tired and weak (fatigue), and raise your risk of bleeding. . Effects on the nerves are called peripheral neuropathy. You may feel numbness, tingling, or pain in your hands and feet. It may be hard for you to button your clothes, open jars, or walk as usual. The effect on the nerves may get worse with more doses of the drug. These effects get better in some people after the drug is stopped but it does not get better in all people. . Changes in your liver function . Bone, joint and muscle pain . Abnormal EKG . Allergic reaction: Allergic reactions, including anaphylaxis are rare but may happen in some patients. Signs of allergic reaction to this drug may be swelling of the face, feeling like your tongue or throat are swelling, trouble breathing, rash, itching, fever, chills, feeling dizzy, and/or feeling that your heart is beating in a fast or not normal way. If this happens, do not take another dose of this drug. You should get urgent medical treatment. . Infection . Changes in your kidney function. Note: Each of the side effects above was reported in 20% or greater of patients treated with paclitaxel. Not all possible side effects are included above.  Warnings and Precautions . Severe bone marrow depression  Side  Effects . To help with hair loss, wash with a mild shampoo and avoid washing your hair every day. . Avoid rubbing your scalp, instead, pat your hair or scalp dry . Avoid coloring your hair . Limit your use of hair spray, electric curlers, blow dryers, and curling irons. . If you are interested in getting a wig, talk to your nurse. You can also call the [place] at 800-ACS-2345 to find out information about the "Look Good, Feel Better" program close to where you live. It is a  free program where women getting chemotherapy can learn about wigs, turbans and scarves as well as makeup techniques and skin and nail care. . Ask your doctor or nurse about medicines that are available to help stop or lessen diarrhea and/or nausea. . To help with nausea and vomiting, eat small, frequent meals instead of three large meals a day. Choose foods and drinks that are at room temperature. Ask your nurse or doctor about other helpful tips and medicine that is available to help or stop lessen these symptoms. . If you get diarrhea, eat low-fiber foods that are high in protein and calories and avoid foods that can irritate your digestive tracts or lead to cramping. Ask your nurse or doctor about medicine that can lessen or stop your diarrhea. . Mouth care is very important. Your mouth care should consist of routine, gentle cleaning of your teeth or dentures and rinsing your mouth with a mixture of 1/2 teaspoon of salt in 8 ounces of water or  teaspoon of baking soda in 8 ounces of water. This should be done at least after each meal and at bedtime. . If you have mouth sores, avoid mouthwash that has alcohol. Also avoid alcohol and smoking because they can bother your mouth and throat. . Drink plenty of fluids (a minimum of eight glasses per day is recommended). . Take your temperature as your doctor or nurse tells you, and whenever you feel like you may have a fever. . Talk to your doctor or nurse about  precautions you can take to avoid infections and bleeding. . Be careful when cooking, walking, and handling sharp objects and hot liquids.  Food and Drug Interactions . There are no known interactions of paclitaxel with food. . This drug may interact with other medicines. Tell your doctor and pharmacist about all the medicines and dietary supplements (vitamins, minerals, herbs and others) that you are taking at this time. . The safety and use of dietary supplements and alternative diets are often not known. Using these might affect your cancer or interfere with your treatment. Until more is known, you should not use dietary supplements or alternative diets without your cancer doctor's help.  When to Call the Doctor Call your doctor or nurse if you have any of the following symptoms and/or any new or unusual symptoms: . Fever of 100.5 F (38 C) or above . Chills . Redness, pain, warmth, or swelling at the IV site during the infusion . Signs of allergic reaction: swelling of the face, feeling like your tongue or throat are swelling, trouble breathing, rash, itching, fever, chills, feeling dizzy, and/or feeling that your heart is beating in a fast or not normal way . Feeling that your heart is beating in a fast or not normal way (palpitations) . Weight gain of 5 pounds in one week (fluid retention) . Decreased urine or very dark urine . Signs of liver problems: dark urine, pale bowel movements, bad stomach pain, feeling very tired and weak, unusual itching, or yellowing of the eyes or skin . Heavy menstrual period that lasts longer than normal . Easy bruising or bleeding . Nausea that stops you from eating or drinking, and/or that is not relieved by prescribed medicines. . Loose bowel movements (diarrhea) more than 4 times a day or diarrhea with weakness or lightheadedness . Pain in your mouth or throat that makes it hard to eat or drink . Lasting loss of appetite or rapid weight loss of five  pounds in a week .  Signs of peripheral neuropathy: numbness, tingling, or decreased feeling in fingers or toes; trouble walking or changes in the way you walk; or feeling clumsy when buttoning clothes, opening jars, or other routine activities . Joint and muscle pain that is not relieved by prescribed medicines . Extreme fatigue that interferes with normal activities . While you are getting this drug, please tell your nurse right away if you have any pain, redness, or swelling at the site of the IV infusion. . If you think you are pregnant.  Reproduction Warnings . Pregnancy warning: This drug may have harmful effects on the unborn child, it is recommended that effective methods of birth control should be used during your cancer treatment. Let your doctor know right away if you think you may be pregnant. . Breast feeding warning: Women should not breast feed during treatment because this drug could enter the breast milk and cause harm to a breast feeding baby.     SELF CARE ACTIVITIES WHILE ON CHEMOTHERAPY: Hydration Increase your fluid intake 48 hours prior to treatment and drink at least 8 to 12 cups (64 ounces) of water/decaff beverages per day after treatment. You can still have your cup of coffee or soda but these beverages do not count as part of your 8 to 12 cups that you need to drink daily. No alcohol intake.  Medications Continue taking your normal prescription medication as prescribed.  If you start any new herbal or new supplements please let us know first to make sure it is safe.  Mouth Care Have teeth cleaned professionally before starting treatment. Keep dentures and partial plates clean. Use soft toothbrush and do not use mouthwashes that contain alcohol. Biotene is a good mouthwash that is available at most pharmacies or may be ordered by calling [phone]. Use warm salt water gargles (1 teaspoon salt per 1 quart warm water) before and after meals and at bedtime. Or you  may rinse with 2 tablespoons of three-percent hydrogen peroxide mixed in eight ounces of water. If you are still having problems with your mouth or sores in your mouth please call the clinic. If you need dental work, please let the doctor know before you go for your appointment so that we can coordinate the best possible time for you in regards to your chemo regimen. You need to also let your dentist know that you are actively taking chemo. We may need to do labs prior to your dental appointment.  Skin Care Always use sunscreen that has not expired and with SPF (Sun Protection Factor) of 50 or higher. Wear hats to protect your head from the sun. Remember to use sunscreen on your hands, ears, face, & feet.  Use good moisturizing lotions such as udder cream, eucerin, or even Vaseline. Some chemotherapies can cause dry skin, color changes in your skin and nails.    . Avoid long, hot showers or baths. . Use gentle, fragrance-free soaps and laundry detergent. . Use moisturizers, preferably creams or ointments rather than lotions because the thicker consistency is better at preventing skin dehydration. Apply the cream or ointment within 15 minutes of showering. Reapply moisturizer at night, and moisturize your hands every time after you wash them.  Hair Loss (if your doctor says your hair will fall out)  . If your doctor says that your hair is likely to fall out, decide before you begin chemo whether you want to wear a wig. You may want to shop before treatment to match your hair color. Marland Kitchen  Hats, turbans, and scarves can also camouflage hair loss, although some people prefer to leave their heads uncovered. If you go bare-headed outdoors, be sure to use sunscreen on your scalp. . Cut your hair short. It eases the inconvenience of shedding lots of hair, but it also can reduce the emotional impact of watching your hair fall out. . Don't perm or color your hair during chemotherapy. Those chemical treatments are  already damaging to hair and can enhance hair loss. Once your chemo treatments are done and your hair has grown back, it's OK to resume dyeing or perming hair. With chemotherapy, hair loss is almost always temporary. But when it grows back, it may be a different color or texture. In older adults who still had hair color before chemotherapy, the new growth may be completely gray.  Often, new hair is very fine and soft.  Infection Prevention Please wash your hands for at least 30 seconds using warm soapy water. Handwashing is the #1 way to prevent the spread of germs. Stay away from sick people or people who are getting over a cold. If you develop respiratory systems such as green/yellow mucus production or productive cough or persistent cough let us know and we will see if you need an antibiotic. It is a good idea to keep a pair of gloves on when going into grocery stores/[employer] to decrease your risk of coming into contact with germs on the carts, etc. Carry alcohol hand gel with you at all times and use it frequently if out in public. If your temperature reaches 100.5 or higher please call the clinic and let us know.  If it is after hours or on the weekend please go to the ER if your temperature is over 100.5.  Please have your own personal thermometer at home to use.    Sex and bodily fluids If you are going to have sex, a condom must be used to protect the person that isn't taking chemotherapy. Chemo can decrease your libido (sex drive). For a few days after chemotherapy, chemotherapy can be excreted through your bodily fluids.  When using the toilet please close the lid and flush the toilet twice.  Do this for a few day after you have had chemotherapy.   Effects of chemotherapy on your sex life Some changes are simple and won't last long. They won't affect your sex life permanently. Sometimes you may feel: . too tired . not strong enough to be very active . sick or sore  . not in the  mood . anxious or low Your anxiety might not seem related to sex. For example, you may be worried about the cancer and how your treatment is going. Or you may be worried about money, or about how you family are coping with your illness. These things can cause stress, which can affect your interest in sex. It's important to talk to your partner about how you feel. Remember - the changes to your sex life don't usually last long. There's usually no medical reason to stop having sex during chemo. The drugs won't have any long term physical effects on your performance or enjoyment of sex. Cancer can't be passed on to your partner during sex  Contraception It's important to use reliable contraception during treatment. Avoid getting pregnant while you or your partner are having chemotherapy. This is because the drugs may harm the baby. Sometimes chemotherapy drugs can leave a man or woman infertile.  This means you would not be able  to have children in the future. You might want to talk to someone about permanent infertility. It can be very difficult to learn that you may no longer be able to have children. Some people find counselling helpful. There might be ways to preserve your fertility, although this is easier for men than for women. You may want to speak to a fertility expert. You can talk about sperm banking or harvesting your eggs. You can also ask about other fertility options, such as donor eggs. If you have or have had breast cancer, your doctor might advise you not to take the contraceptive pill. This is because the hormones in it might affect the cancer.  It is not known for sure whether or not chemotherapy drugs can be passed on through semen or secretions from the vagina. Because of this some doctors advise people to use a barrier method if you have sex during treatment. This applies to vaginal, anal or oral sex. Generally, doctors advise a barrier method only for the time you are actually having  the treatment and for about a week after your treatment. Advice like this can be worrying, but this does not mean that you have to avoid being intimate with your partner. You can still have close contact with your partner and continue to enjoy sex.  Animals If you have cats or birds we just ask that you not change the litter or change the cage.  Please have someone else do this for you while you are on chemotherapy.   Food Safety During and After Cancer Treatment Food safety is important for people both during and after cancer treatment. Cancer and cancer treatments, such as chemotherapy, radiation therapy, and stem cell/bone marrow transplantation, often weaken the immune system. This makes it harder for your body to protect itself from foodborne illness, also called food poisoning. Foodborne illness is caused by eating food that contains harmful bacteria, parasites, or viruses.  Foods to avoid Some foods have a higher risk of becoming tainted with bacteria. These include: Marland Kitchen Unwashed fresh fruit and vegetables, especially leafy vegetables that can hide dirt and other contaminants . Raw sprouts, such as alfalfa sprouts . Raw or undercooked beef, especially ground beef, or other raw or undercooked meat and poultry . Fatty, fried, or spicy foods immediately before or after treatment.  These can sit heavy on your stomach and make you feel nauseous. . Raw or undercooked shellfish, such as oysters. . Sushi and sashimi, which often contain raw fish.  . Unpasteurized beverages, such as unpasteurized fruit juices, raw milk, raw yogurt, or cider . Undercooked eggs, such as soft boiled, over easy, and poached; raw, unpasteurized eggs; or foods made with raw egg, such as homemade raw cookie dough and homemade mayonnaise Simple steps for food safety Shop smart. . Do not buy food stored or displayed in an unclean area. . Do not buy bruised or damaged fruits or vegetables. . Do not buy cans that have  cracks, dents, or bulges. . Pick up foods that can spoil at the end of your shopping trip and store them in a cooler on the way home. Prepare and clean up foods carefully. . Rinse all fresh fruits and vegetables under running water, and dry them with a clean towel or paper towel. . Clean the top of cans before opening them. . After preparing food, wash your hands for 20 seconds with hot water and soap. Pay special attention to areas between fingers and under nails. . Clean your  utensils and dishes with hot water and soap. Marland Kitchen Disinfect your kitchen and cutting boards using 1 teaspoon of liquid, unscented bleach mixed into 1 quart of water.   Dispose of old food. . Eat canned and packaged food before its expiration date (the "use by" or "best before" date). . Consume refrigerated leftovers within 3 to 4 days. After that time, throw out the food. Even if the food does not smell or look spoiled, it still may be unsafe. Some bacteria, such as Listeria, can grow even on foods stored in the refrigerator if they are kept for too long. Take precautions when eating out. . At restaurants, avoid buffets and salad bars where food sits out for a long time and comes in contact with many people. Food can become contaminated when someone with a virus, often a norovirus, or another "bug" handles it. . Put any leftover food in a "to-go" container yourself, rather than having the server do it. And, refrigerate leftovers as soon as you get home. . Choose restaurants that are clean and that are willing to prepare your food as you order it cooked.         MEDICATIONS:                                                                                                             Zofran/Ondansetron 8mg  tablet. Take 1 tablet every 8 hours as needed for nausea/vomiting. (#1 nausea med to take, this can constipate) Compazine/Prochlorperazine 10mg  tablet. Take 1 tablet every 6 hours as needed for nausea/vomiting. (#2 nausea  med to take, this can make you sleepy)  EMLA cream. Apply a quarter size amount to port site 1 hour prior to chemo. Do not rub in. Cover with plastic wrap.  Over-the-Counter Meds:  Miralax 1 capful in 8 oz of fluid daily. May increase to two times a day if needed. This is a stool softener. If this doesn't work proceed you can add:  Senokot S-start with 1 tablet two times a day and increase to 4 tablets two times a day if needed. (total of 8 tablets in a 24 hour period). This is a stimulant laxative.   Call us if this does not help your bowels move.   Imodium 2mg  capsule. Take 2 capsules after the 1st loose stool and then 1 capsule every 2 hours until you go a total of 12 hours without having a loose stool. Call the [facility] if loose stools continue. If diarrhea occurs @ bedtime, take 2 capsules @ bedtime. Then take 2 capsules every 4 hours until morning. Call [facility].            Diarrhea Sheet  If you are having loose stools/diarrhea, please purchase Imodium and begin taking as outlined:  At the first sign of poorly formed or loose stools you should begin taking Imodium(loperamide) 2 mg capsules.  Take two caplets (4mg ) followed by one caplet (2mg ) every 2 hours until you have had no diarrhea for 12 hours.  During the night take two caplets (  4mg ) at bedtime and continue every 4 hours during the night until the morning.  Stop taking Imodium only after there is no sign of diarrhea for 12 hours.    Always call the [facility] if you are having loose stools/diarrhea that you can't get under control.  Loose stools/diarrhea leads to dehydration (loss of water) in your body.  We have other options of trying to get the loose stools/diarrhea to stopped but you must let us know!    Constipation Sheet *Miralax in 8 oz of fluid daily.  May increase to two times a day if needed.  This is a stool softener.  If this not enough to keep your bowel regular:  You can add:  *Senokot S,  start with one tablet twice a day and can increase to 4 tablets twice a day if needed.  This is a stimulant laxative.   Sometimes when you take pain medication you need BOTH a medicine to keep your stool soft and a medicine to help your bowel push it out!  Please call if the above does not work for you.   Do not go more than 2 days without a bowel movement.  It is very important that you do not become constipated.  It will make you feel sick to your stomach (nausea) and can cause abdominal pain and vomiting.  Nausea Sheet  Zofran/Ondansetron 8mg  tablet. Take 1 tablet every 8 hours as needed for nausea/vomiting. (#1 nausea med to take, this can constipate)  Compazine/Prochlorperazine 10mg  tablet. Take 1 tablet every 6 hours as needed for nausea/vomiting. (#2 nausea med to take, this can make you sleepy)  You can take these medications together or separately.  We would first like for you to try the Ondansetron by itself and then take the Prochloperizine if needed. But you are allowed to take both medications at the same time if your nausea is that severe.  If you are having persistent nausea (nausea that does not stop) please take these medications on a staggered schedule so that the nausea medication stays in your body.  Please call the [facility] and let us know the amount of nausea that you are experiencing.  If you begin to vomit, you need to call the [facility] and if it is the weekend and you have vomited more than one time and cant get it to stop-go to the Emergency Room.  Persistent nausea/vomiting can lead to dehydration (loss of fluid in your body) and will make you feel terrible.   Ice chips, sips of clear liquids, foods that are @ room temperature, crackers, and toast tend to be better tolerated.     SYMPTOMS TO REPORT AS SOON AS POSSIBLE AFTER TREATMENT:  FEVER GREATER THAN 100.5 F  CHILLS WITH OR WITHOUT FEVER  NAUSEA AND VOMITING THAT IS NOT CONTROLLED WITH YOUR NAUSEA  MEDICATION  UNUSUAL SHORTNESS OF BREATH  UNUSUAL BRUISING OR BLEEDING  TENDERNESS IN MOUTH AND THROAT WITH OR WITHOUT PRESENCE OF ULCERS  URINARY PROBLEMS  BOWEL PROBLEMS  UNUSUAL RASH     Wear comfortable clothing and clothing appropriate for easy access to any Portacath or PICC line. Let us know if there is anything that we can do to make your therapy better!    What to do if you need assistance after hours or on the weekends: CALL [phone].  HOLD on the line, do not hang up.  You will hear multiple messages but at the end you will be connected with a  nurse triage line.  They will contact the doctor if necessary.  Most of the time they will be able to assist you.  Do not call the hospital operator.    I have been informed and understand all of the instructions given to me and have received a copy. I have been instructed to call the clinic [phone] or my family physician as soon as possible for continued medical care, if indicated. I do not have any more questions at this time but understand that I may call the [facility] or the Patient Navigator at [phone] during office hours should I have questions or need assistance in obtaining follow-up care.

## 2017-04-15 NOTE — Telephone Encounter (Signed)
After speaking with [facility], they still want to start radiation on 3/4.  Spoke with Dr Walden Field.  We have moved the pts appt to 3/4 at 3:20 pm and will start chemo either 3/5 or 3/6 so this will still align with radiation.  I let her know I would do chemo teaching on Monday as well. Called and let Langley Gauss know and she verbalized understanding.

## 2017-04-16 ENCOUNTER — Other Ambulatory Visit (HOSPITAL_COMMUNITY): Payer: Self-pay | Admitting: Pharmacist

## 2017-04-19 ENCOUNTER — Other Ambulatory Visit: Payer: Self-pay

## 2017-04-19 ENCOUNTER — Inpatient Hospital Stay (HOSPITAL_COMMUNITY): Payer: Medicare Other | Attending: Internal Medicine | Admitting: Internal Medicine

## 2017-04-19 ENCOUNTER — Other Ambulatory Visit (HOSPITAL_COMMUNITY): Payer: Self-pay | Admitting: Pharmacist

## 2017-04-19 ENCOUNTER — Encounter (HOSPITAL_COMMUNITY): Payer: Self-pay | Admitting: Internal Medicine

## 2017-04-19 ENCOUNTER — Inpatient Hospital Stay (HOSPITAL_COMMUNITY): Payer: Medicare Other

## 2017-04-19 ENCOUNTER — Encounter (HOSPITAL_COMMUNITY): Payer: Self-pay

## 2017-04-19 ENCOUNTER — Ambulatory Visit
Admission: RE | Admit: 2017-04-19 | Discharge: 2017-04-19 | Disposition: A | Discharge status: Home / Self Care | Payer: Medicare Other | Source: Ambulatory Visit | Attending: Radiation Oncology | Admitting: Radiation Oncology

## 2017-04-19 VITALS — BP 127/81 | HR 92 | Temp 98.3°F | Resp 18 | Ht 64.0 in | Wt 189.1 lb

## 2017-04-19 DIAGNOSIS — C155 Malignant neoplasm of lower third of esophagus: Secondary | ICD-10-CM | POA: Insufficient documentation

## 2017-04-19 DIAGNOSIS — F250 Schizoaffective disorder, bipolar type: Secondary | ICD-10-CM | POA: Diagnosis not present

## 2017-04-19 DIAGNOSIS — C160 Malignant neoplasm of cardia: Secondary | ICD-10-CM | POA: Diagnosis not present

## 2017-04-19 DIAGNOSIS — I2510 Atherosclerotic heart disease of native coronary artery without angina pectoris: Secondary | ICD-10-CM | POA: Diagnosis not present

## 2017-04-19 DIAGNOSIS — I5022 Chronic systolic (congestive) heart failure: Secondary | ICD-10-CM | POA: Diagnosis not present

## 2017-04-19 DIAGNOSIS — E7800 Pure hypercholesterolemia, unspecified: Secondary | ICD-10-CM | POA: Insufficient documentation

## 2017-04-19 DIAGNOSIS — I700 Atherosclerosis of aorta: Secondary | ICD-10-CM | POA: Insufficient documentation

## 2017-04-19 DIAGNOSIS — E119 Type 2 diabetes mellitus without complications: Secondary | ICD-10-CM | POA: Insufficient documentation

## 2017-04-19 DIAGNOSIS — Z8619 Personal history of other infectious and parasitic diseases: Secondary | ICD-10-CM | POA: Diagnosis not present

## 2017-04-19 DIAGNOSIS — I129 Hypertensive chronic kidney disease with stage 1 through stage 4 chronic kidney disease, or unspecified chronic kidney disease: Secondary | ICD-10-CM | POA: Diagnosis not present

## 2017-04-19 DIAGNOSIS — R112 Nausea with vomiting, unspecified: Secondary | ICD-10-CM | POA: Insufficient documentation

## 2017-04-19 DIAGNOSIS — Z87442 Personal history of urinary calculi: Secondary | ICD-10-CM | POA: Insufficient documentation

## 2017-04-19 DIAGNOSIS — Z510 Encounter for antineoplastic radiation therapy: Secondary | ICD-10-CM | POA: Diagnosis not present

## 2017-04-19 DIAGNOSIS — N183 Chronic kidney disease, stage 3 (moderate): Secondary | ICD-10-CM | POA: Insufficient documentation

## 2017-04-19 DIAGNOSIS — Z79899 Other long term (current) drug therapy: Secondary | ICD-10-CM | POA: Insufficient documentation

## 2017-04-19 DIAGNOSIS — R1310 Dysphagia, unspecified: Secondary | ICD-10-CM | POA: Diagnosis not present

## 2017-04-19 DIAGNOSIS — K219 Gastro-esophageal reflux disease without esophagitis: Secondary | ICD-10-CM | POA: Diagnosis not present

## 2017-04-19 DIAGNOSIS — C158 Malignant neoplasm of overlapping sites of esophagus: Secondary | ICD-10-CM | POA: Diagnosis not present

## 2017-04-19 DIAGNOSIS — Z5111 Encounter for antineoplastic chemotherapy: Secondary | ICD-10-CM | POA: Diagnosis not present

## 2017-04-19 DIAGNOSIS — K5900 Constipation, unspecified: Secondary | ICD-10-CM | POA: Insufficient documentation

## 2017-04-19 DIAGNOSIS — Z86010 Personal history of colonic polyps: Secondary | ICD-10-CM | POA: Diagnosis not present

## 2017-04-19 DIAGNOSIS — G4733 Obstructive sleep apnea (adult) (pediatric): Secondary | ICD-10-CM | POA: Diagnosis not present

## 2017-04-19 DIAGNOSIS — I252 Old myocardial infarction: Secondary | ICD-10-CM

## 2017-04-19 DIAGNOSIS — I10 Essential (primary) hypertension: Secondary | ICD-10-CM

## 2017-04-19 DIAGNOSIS — R011 Cardiac murmur, unspecified: Secondary | ICD-10-CM | POA: Diagnosis not present

## 2017-04-19 MED ORDER — DEXAMETHASONE 4 MG PO TABS: 8.0000 mg | ORAL_TABLET | Freq: Every day | ORAL | 1 refills | Status: DC

## 2017-04-19 MED ORDER — LIDOCAINE-PRILOCAINE 2.5-2.5 % EX CREA: TOPICAL_CREAM | CUTANEOUS | 3 refills | Status: DC

## 2017-04-19 MED ORDER — ONDANSETRON 8 MG PO TBDP: 8.0000 mg | ORAL_TABLET | Freq: Three times a day (TID) | ORAL | 2 refills | Status: AC | PRN

## 2017-04-19 MED ORDER — LIDOCAINE-PRILOCAINE 2.5-2.5 % EX CREA: TOPICAL_CREAM | CUTANEOUS | 2 refills | Status: DC

## 2017-04-19 MED ORDER — ONDANSETRON HCL 8 MG PO TABS: 8.0000 mg | ORAL_TABLET | Freq: Three times a day (TID) | ORAL | 2 refills | Status: DC | PRN

## 2017-04-19 MED ORDER — PROCHLORPERAZINE MALEATE 10 MG PO TABS: 10.0000 mg | ORAL_TABLET | Freq: Four times a day (QID) | ORAL | 1 refills | Status: DC | PRN

## 2017-04-19 MED ORDER — PROCHLORPERAZINE MALEATE 10 MG PO TABS: 10.0000 mg | ORAL_TABLET | Freq: Four times a day (QID) | ORAL | 2 refills | Status: DC | PRN

## 2017-04-19 MED ORDER — MAGIC MOUTHWASH W/LIDOCAINE: 5.0000 mL | Freq: Four times a day (QID) | ORAL | 1 refills | Status: DC | PRN

## 2017-04-19 NOTE — Patient Instructions (Addendum)
[place] at [facility] Discharge Instructions   You were seen today by Dr. Zoila Shutter You met with Joanne Gavel, RN our nurse navigator.  Her phone number is [phone].  She gave you a packet with information on your chemotherapy medications that you will be taking.   If you should have any questions moving forward please do not hesitate to call.  You can call and leave her a message and she will return you call the next business day.  Dr. Walden Field provided you with some additional information on the medications. You will be seeing [place] weekly for treatment. We will draw your labs weekly before treatment and monitor those closely.  If anything is off with your sugar we will send it to Dr. Luan Pulling.  You will see a physician every other treatment or if other symptoms arise that warrant a check up.   We will draw your lab work on Wednesday before treatment.    We will call you in a prescription for nausea and for mouth sores should you need them.       Thank you for choosing [place] at [facility] to provide your oncology and hematology care.  To afford each patient quality time with our provider, please arrive at least 15 minutes before your scheduled appointment time.    If you have a lab appointment with the [facility] please come in thru the  Main Entrance and check in at the main information desk  You need to re-schedule your appointment should you arrive 10 or more minutes late.  We strive to give you quality time with our providers, and arriving late affects you and other patients whose appointments are after yours.  Also, if you no show three or more times for appointments you may be dismissed from the clinic at the providers discretion.     Again, thank you for choosing [facility].  Our hope is that these requests will decrease the amount of time that you wait before being seen by our physicians.        _____________________________________________________________  Should you have questions after your visit to [facility], please contact our office at [phone] between the hours of 8:30 a.m. and 4:30 p.m.  Voicemails left after 4:30 p.m. will not be returned until the following business day.  For prescription refill requests, have your pharmacy contact our office.       Resources For Cancer Patients and their Caregivers ? American Cancer Society: Can assist with transportation, wigs, general needs, runs Look Good Feel Better.        484-136-8868 ? Cancer Care: Provides financial assistance, online support groups, medication/co-pay assistance.  1-800-813-HOPE 562-680-8188) ? [facility] Assists [place] cancer patients and their families through emotional , educational and financial support.  623-427-8791 ? [place] DSS Where to apply for food stamps, Medicaid and utility assistance. 534-431-4436 ? RCATS: Transportation to medical appointments. 2480261165 ? Social Security Administration: May apply for disability if have a Stage IV cancer. 351-780-9179 437-795-3774 ? [employer], Disability and Transit Services: Assists with nutrition, care and transit needs. [place] Support Programs:   > Cancer Support Group  2nd Tuesday of the month 1pm-2pm, Journey Room   > Creative Journey  3rd Tuesday of the month 1130am-1pm, Journey Room

## 2017-04-19 NOTE — Progress Notes (Signed)
Chemotherapy teaching completed.  Consent signed.  Extensive teaching packet given.   

## 2017-04-20 ENCOUNTER — Ambulatory Visit (INDEPENDENT_AMBULATORY_CARE_PROVIDER_SITE_OTHER): Payer: Medicare Other | Admitting: Cardiovascular Disease

## 2017-04-20 ENCOUNTER — Encounter: Payer: Self-pay | Admitting: Cardiovascular Disease

## 2017-04-20 ENCOUNTER — Ambulatory Visit
Admission: RE | Admit: 2017-04-20 | Discharge: 2017-04-20 | Disposition: A | Discharge status: Home / Self Care | Payer: Medicare Other | Source: Ambulatory Visit | Attending: Radiation Oncology | Admitting: Radiation Oncology

## 2017-04-20 VITALS — BP 116/68 | HR 88 | Ht 64.0 in | Wt 192.0 lb

## 2017-04-20 DIAGNOSIS — I5042 Chronic combined systolic (congestive) and diastolic (congestive) heart failure: Secondary | ICD-10-CM

## 2017-04-20 DIAGNOSIS — C159 Malignant neoplasm of esophagus, unspecified: Secondary | ICD-10-CM

## 2017-04-20 DIAGNOSIS — I25118 Atherosclerotic heart disease of native coronary artery with other forms of angina pectoris: Secondary | ICD-10-CM | POA: Diagnosis not present

## 2017-04-20 DIAGNOSIS — G4733 Obstructive sleep apnea (adult) (pediatric): Secondary | ICD-10-CM

## 2017-04-20 DIAGNOSIS — I351 Nonrheumatic aortic (valve) insufficiency: Secondary | ICD-10-CM | POA: Diagnosis not present

## 2017-04-20 DIAGNOSIS — I10 Essential (primary) hypertension: Secondary | ICD-10-CM | POA: Diagnosis not present

## 2017-04-20 DIAGNOSIS — C155 Malignant neoplasm of lower third of esophagus: Secondary | ICD-10-CM | POA: Diagnosis not present

## 2017-04-20 DIAGNOSIS — C160 Malignant neoplasm of cardia: Secondary | ICD-10-CM | POA: Diagnosis not present

## 2017-04-20 DIAGNOSIS — I350 Nonrheumatic aortic (valve) stenosis: Secondary | ICD-10-CM

## 2017-04-20 DIAGNOSIS — I429 Cardiomyopathy, unspecified: Secondary | ICD-10-CM | POA: Diagnosis not present

## 2017-04-20 DIAGNOSIS — Z510 Encounter for antineoplastic radiation therapy: Secondary | ICD-10-CM | POA: Diagnosis not present

## 2017-04-20 NOTE — Patient Instructions (Addendum)
Your physician wants you to follow-up in: 4 months  with Dr Bronson Ing     Your physician recommends that you continue on your current medications as directed. Please refer to the Current Medication list given to you today.    If you need a refill on your cardiac medications before your next appointment, please call your pharmacy.    No lab work or tests ordered today      Thank you for choosing [facility] !

## 2017-04-20 NOTE — Progress Notes (Signed)
SUBJECTIVE: The patient presents for routine follow-up. He has chronic combined systolic and diastolic heart failure. He also has accelerated hypertension, obstructive sleep apnea and is noncompliant with CPAP, and coronary artery disease with a prior history of MI.  His most recent echocardiogram performed on 10/27/16 showed an improvement in left reticular systolic function, LVEF 32-20%, grade 1 diastolic dysfunction, with moderate aortic stenosis and mild to moderate aortic regurgitation.  He has been diagnosed with esophageal cancer and underwent his first radiation treatment yesterday.  He is scheduled to undergo his first chemotherapy infusion tomorrow.  The plan is to shrink the tumor and then undergo surgical resection.  He has some odynophagia related to this.  He denies shortness of breath, leg swelling, exertional chest pain, orthopnea, palpitations, and paroxysmal nocturnal dyspnea.  Labs which I reviewed performed on 04/06/17: BUN 20, creatinine 1.33, elevated hemoglobin A1c 8.8%.      Review of Systems: As per "subjective", otherwise negative.  Allergies  Allergen Reactions  . Codeine Nausea And Vomiting  . Morphine And Related Nausea And Vomiting    Current Outpatient Medications  Medication Sig Dispense Refill  . acetaminophen (TYLENOL) 325 MG tablet Take 650 mg by mouth every 6 (six) hours as needed.    . AMITIZA 24 MCG capsule Take 24 mcg by mouth 2 (two) times a week.     Marland Kitchen aspirin EC 81 MG tablet Take 81 mg by mouth daily.    Marland Kitchen CARBOPLATIN IV Inject into the vein.    . carvedilol (COREG) 12.5 MG tablet Take 1 tablet (12.5 mg total) by mouth 2 (two) times daily. 180 tablet 3  . dexamethasone (DECADRON) 4 MG tablet Take 2 tablets (8 mg total) by mouth daily. Start the day after chemotherapy for 2 days. 30 tablet 1  . dexlansoprazole (DEXILANT) 60 MG capsule Take 1 capsule (60 mg total) by mouth daily. 90 capsule 3  . furosemide (LASIX) 40 MG tablet Alternate  40 mg one day and 20 mg the next (Patient taking differently: Take 20-40 mg by mouth See admin instructions. Alternate 40 mg one day and 20 mg the next) 90 tablet 3  . lidocaine-prilocaine (EMLA) cream Apply a quarter size amount to affected area 1 hour prior to coming to chemotherapy. Do not rub in. Cover with plastic wrap. 30 g 2  . lidocaine-prilocaine (EMLA) cream Apply to affected area once 30 g 3  . linagliptin (TRADJENTA) 5 MG TABS tablet Take 5 mg by mouth daily.    . magic mouthwash w/lidocaine SOLN Take 5 mLs by mouth 4 (four) times daily as needed for mouth pain. 600 mL 1  . OLANZapine (ZYPREXA) 5 MG tablet Take 5 mg by mouth 2 (two) times daily.    . ondansetron (ZOFRAN ODT) 8 MG disintegrating tablet Take 1 tablet (8 mg total) by mouth every 8 (eight) hours as needed for nausea or vomiting. 40 tablet 2  . ondansetron (ZOFRAN) 8 MG tablet Take 1 tablet (8 mg total) by mouth every 8 (eight) hours as needed for nausea or vomiting. 30 tablet 2  . oxyCODONE (ROXICODONE) 5 MG immediate release tablet Take 1 tablet (5 mg total) by mouth every 4 (four) hours as needed. 10 tablet 0  . PACLitaxel (TAXOL IV) Inject into the vein.    . potassium chloride SA (K-DUR,KLOR-CON) 20 MEQ tablet Take 20 mEq by mouth daily.    . prochlorperazine (COMPAZINE) 10 MG tablet Take 1 tablet (10 mg total) by mouth  every 6 (six) hours as needed for nausea or vomiting. 30 tablet 2  . prochlorperazine (COMPAZINE) 10 MG tablet Take 1 tablet (10 mg total) by mouth every 6 (six) hours as needed (Nausea or vomiting). 30 tablet 1  . sacubitril-valsartan (ENTRESTO) 49-51 MG Take 1 tablet by mouth 2 (two) times daily. 60 tablet 6  . rosuvastatin (CRESTOR) 10 MG tablet Take 1 tablet (10 mg total) by mouth daily. 90 tablet 3   No current facility-administered medications for this visit.     Past Medical History:  Diagnosis Date  . Arthritis   . Bipolar disorder ([place])   . CAD (coronary artery disease) 08/16/2016  .  Cellulitis and abscess of foot 03/05/2016  . Chronic combined systolic and diastolic heart failure ([place])   . Diabetes mellitus without complication ([place])    boderline  . GERD (gastroesophageal reflux disease)   . Heart murmur 1995  . History of kidney stones   . Hypercholesteremia   . Hypertension   . Kidney stones   . Myocardial infarction ([place])   . OSA (obstructive sleep apnea)    no cpap, can't tolerate  . Schizoaffective disorder, bipolar type ([place]) 04/25/2016    Past Surgical History:  Procedure Laterality Date  . APPENDECTOMY    . BIOPSY  03/09/2017   Procedure: BIOPSY;  Surgeon: Danie Binder, MD;  Location[facility];  Service: Endoscopy;;  gastric esophageal  . CHOLECYSTECTOMY N/A 10/20/2013   Procedure: LAPAROSCOPIC CHOLECYSTECTOMY;  Surgeon: [name], MD;  Location: [facility] ORS;  Service: General;  Laterality: N/A;  . COLONOSCOPY WITH PROPOFOL N/A 03/09/2017   Procedure: COLONOSCOPY WITH PROPOFOL;  Surgeon: Danie Binder, MD;  [facility]: [facility];  Service: Endoscopy;  Laterality: N/A;  12:15pm  . ESOPHAGOGASTRODUODENOSCOPY (EGD) WITH PROPOFOL N/A 03/09/2017   Procedure: ESOPHAGOGASTRODUODENOSCOPY (EGD) WITH PROPOFOL;  Surgeon: Danie Binder, MD;  [facility];  Service: Endoscopy;  Laterality: N/A;  . EUS N/A 04/08/2017   Procedure: UPPER ENDOSCOPIC ULTRASOUND (EUS) RADIAL;  Surgeon: Milus Banister, MD;  Location: [facility];  Service: Endoscopy;  Laterality: N/A;  . HIP SURGERY Left   . KNEE SURGERY Left   . POLYPECTOMY  03/09/2017   Procedure: POLYPECTOMY;  Surgeon: Danie Binder, MD;  [facility];  Service: Endoscopy;;  colon   . PORTACATH PLACEMENT Left 04/07/2017   Procedure: INSERTION PORT-A-CATH LEFT SUBCLAVIAN;  Surgeon: Virl Cagey, MD;  Location: [facility] OR[facility];  Service: General;  Laterality: Left;  . SAVORY DILATION N/A 03/09/2017   Procedure: SAVORY DILATION;  Surgeon: Danie Binder, MD;  [facility];   Service: Endoscopy;  Laterality: N/A;    Social History   Socioeconomic History  . Marital status: Divorced    Spouse name: Not on file  . Number of children: Not on file  . Years of education: Not on file  . Highest education level: Not on file  Social Needs  . Financial resource strain: Not on file  . Food insecurity - worry: Not on file  . Food insecurity - inability: Not on file  . Transportation needs - medical: Not on file  . Transportation needs - non-medical: Not on file  Occupational History  . Not on file  Tobacco Use  . Smoking status: Never Smoker  . Smokeless tobacco: Never Used  Substance and Sexual Activity  . Alcohol use: No  . Drug use: No  . Sexual activity: Not Currently    Birth  control/protection: Implant  Other Topics Concern  . Not on file  Social History Narrative  . Not on file     Vitals:   04/20/17 0957  BP: 116/68  Pulse: 88  SpO2: 97%  Weight: 192 lb (87.1 kg)  Height: 5\' 4"  (1.626 m)    Wt Readings from Last 3 Encounters:  04/20/17 192 lb (87.1 kg)  04/19/17 189 lb 1.6 oz (85.8 kg)  04/08/17 188 lb (85.3 kg)     PHYSICAL EXAM General: NAD HEENT: Normal. Neck: No JVD, no thyromegaly. Lungs: Clear to auscultation bilaterally with normal respiratory effort. CV: Regular rate and rhythm, normal S1/S2, no O3/Z8, 3/6 systolicmurmur over RUSB and along LSB. No pretibial or periankle edema.  No carotid bruit.   Abdomen: Soft, nontender, no distention.  Neurologic: Alert and oriented.  Psych: Normal affect. Skin: Normal. Musculoskeletal: No gross deformities.    ECG: Most recent ECG reviewed.   Labs: Lab Results  Component Value Date/Time   K 3.7 03/19/2017 11:49 AM   BUN 18 03/19/2017 11:49 AM   CREATININE 1.34 (H) 03/19/2017 11:49 AM   CREATININE 1.44 (H) 07/17/2016 09:00 AM   ALT 12 (L) 03/19/2017 11:49 AM   TSH 1.045 08/16/2016 10:35 PM   HGB 14.7 03/19/2017 11:49 AM     Lipids: Lab Results  Component Value  Date/Time   LDLCALC 114 (H) 08/17/2016 05:17 AM   CHOL 182 08/17/2016 05:17 AM   TRIG 186 (H) 08/17/2016 05:17 AM   HDL 31 (L) 08/17/2016 05:17 AM       ASSESSMENT AND PLAN:  1. Chronic systolic and diastolic heart failure with mild to moderately reduced left ventricular systolic dysfunction, LVEF 40-45%: He appears to be euvolemic on Lasix 40 mg  daily. Continue carvedilol and Entresto 49/51 mgtwice daily. Nuclear stress test showed significant myocardial scar.  2. Hypertension:  Controlled.  No changes to therapy.  3. OSA: Noncompliant with CPAP.  4. CAD with prior HY:IFOYDXAJOINOMVE stable. Nuclear stress test results detailed above no evidence of ischemia. I will continue aspirin 81 mg and Crestor 10 mg. Continue carvedilol and ARB.  5. Cardiomyopathy: Most recent echocardiogram detailed above demonstrated an improvement in LVEF to 40-45%. Medical therapy as noted above.  6. Aortic stenosis and regurgitation: Moderate aortic stenosis and moderate aortic regurgitation as demonstrated by echocardiogram in September 2018.  I will monitor clinically and with surveillance echocardiography.  7.  Esophageal cancer: Chemotherapy to begin tomorrow with first radiation treatment yesterday.  He is ultimately going to undergo surgical resection.   Disposition: Follow up 4 months   Kate Sable, M.D., F.A.C.C.

## 2017-04-21 ENCOUNTER — Ambulatory Visit
Admission: RE | Admit: 2017-04-21 | Discharge: 2017-04-21 | Disposition: A | Discharge status: Home / Self Care | Payer: Medicare Other | Source: Ambulatory Visit | Attending: Radiation Oncology | Admitting: Radiation Oncology

## 2017-04-21 ENCOUNTER — Inpatient Hospital Stay (HOSPITAL_COMMUNITY): Payer: Medicare Other

## 2017-04-21 VITALS — BP 110/61 | HR 80 | Temp 98.0°F | Resp 16 | Wt 189.0 lb

## 2017-04-21 DIAGNOSIS — C158 Malignant neoplasm of overlapping sites of esophagus: Secondary | ICD-10-CM

## 2017-04-21 DIAGNOSIS — C160 Malignant neoplasm of cardia: Secondary | ICD-10-CM | POA: Diagnosis not present

## 2017-04-21 DIAGNOSIS — C155 Malignant neoplasm of lower third of esophagus: Secondary | ICD-10-CM | POA: Diagnosis not present

## 2017-04-21 DIAGNOSIS — Z510 Encounter for antineoplastic radiation therapy: Secondary | ICD-10-CM | POA: Diagnosis not present

## 2017-04-21 DIAGNOSIS — Z5111 Encounter for antineoplastic chemotherapy: Secondary | ICD-10-CM | POA: Diagnosis not present

## 2017-04-21 LAB — CBC WITH DIFFERENTIAL/PLATELET
BASOS PCT: 0 %
Basophils Absolute: 0 10*3/uL (ref 0.0–0.1)
EOS ABS: 0.2 10*3/uL (ref 0.0–0.7)
Eosinophils Relative: 2 %
HCT: 43.2 % (ref 39.0–52.0)
HEMOGLOBIN: 14.5 g/dL (ref 13.0–17.0)
Lymphocytes Relative: 12 %
Lymphs Abs: 1 10*3/uL (ref 0.7–4.0)
MCH: 28.4 pg (ref 26.0–34.0)
MCHC: 33.6 g/dL (ref 30.0–36.0)
MCV: 84.5 fL (ref 78.0–100.0)
MONOS PCT: 7 %
Monocytes Absolute: 0.6 10*3/uL (ref 0.1–1.0)
NEUTROS ABS: 6.8 10*3/uL (ref 1.7–7.7)
NEUTROS PCT: 79 %
Platelets: 113 10*3/uL — ABNORMAL LOW (ref 150–400)
RBC: 5.11 MIL/uL (ref 4.22–5.81)
RDW: 13.7 % (ref 11.5–15.5)
WBC: 8.7 10*3/uL (ref 4.0–10.5)

## 2017-04-21 LAB — COMPREHENSIVE METABOLIC PANEL
ALT: 12 U/L — AB (ref 17–63)
ANION GAP: 11 (ref 5–15)
AST: 16 U/L (ref 15–41)
Albumin: 3.4 g/dL — ABNORMAL LOW (ref 3.5–5.0)
Alkaline Phosphatase: 114 U/L (ref 38–126)
BUN: 25 mg/dL — ABNORMAL HIGH (ref 6–20)
CO2: 25 mmol/L (ref 22–32)
CREATININE: 1.17 mg/dL (ref 0.61–1.24)
Calcium: 8.6 mg/dL — ABNORMAL LOW (ref 8.9–10.3)
Chloride: 96 mmol/L — ABNORMAL LOW (ref 101–111)
GFR calc non Af Amer: >60 mL/min (ref >60)
Glucose, Bld: 428 mg/dL — ABNORMAL HIGH (ref 65–99)
Potassium: 4.2 mmol/L (ref 3.5–5.1)
Sodium: 132 mmol/L — ABNORMAL LOW (ref 135–145)
Total Bilirubin: 0.8 mg/dL (ref 0.3–1.2)
Total Protein: 6.6 g/dL (ref 6.5–8.1)

## 2017-04-21 LAB — LACTATE DEHYDROGENASE: LDH: 138 U/L (ref 98–192)

## 2017-04-21 MED ORDER — CARBOPLATIN CHEMO INJECTION 450 MG/45ML
198.8000 mg | Freq: Once | INTRAVENOUS | Status: AC
  Administered 2017-04-21: 200 mg via INTRAVENOUS
  Filled 2017-04-21: qty 20

## 2017-04-21 MED ORDER — HEPARIN SOD (PORK) LOCK FLUSH 100 UNIT/ML IV SOLN
500.0000 [IU] | Freq: Once | INTRAVENOUS | Status: AC | PRN
  Administered 2017-04-21: 500 [IU]
  Filled 2017-04-21 (×2): qty 5

## 2017-04-21 MED ORDER — DIPHENHYDRAMINE HCL 50 MG/ML IJ SOLN
50.0000 mg | Freq: Once | INTRAMUSCULAR | Status: AC
  Administered 2017-04-21: 50 mg via INTRAVENOUS
  Filled 2017-04-21: qty 1

## 2017-04-21 MED ORDER — PALONOSETRON HCL INJECTION 0.25 MG/5ML
0.2500 mg | Freq: Once | INTRAVENOUS | Status: AC
  Administered 2017-04-21: 0.25 mg via INTRAVENOUS
  Filled 2017-04-21: qty 5

## 2017-04-21 MED ORDER — SODIUM CHLORIDE 0.9 % IV SOLN
20.0000 mg | Freq: Once | INTRAVENOUS | Status: DC
  Filled 2017-04-21: qty 2

## 2017-04-21 MED ORDER — FAMOTIDINE IN NACL 20-0.9 MG/50ML-% IV SOLN
20.0000 mg | Freq: Once | INTRAVENOUS | Status: AC
  Administered 2017-04-21: 20 mg via INTRAVENOUS
  Filled 2017-04-21: qty 50

## 2017-04-21 MED ORDER — SODIUM CHLORIDE 0.9 % IV SOLN: 10.0000 mg | Freq: Once | INTRAVENOUS | Status: DC

## 2017-04-21 MED ORDER — DEXAMETHASONE SODIUM PHOSPHATE 10 MG/ML IJ SOLN
INTRAMUSCULAR | Status: AC
  Filled 2017-04-21: qty 1

## 2017-04-21 MED ORDER — SODIUM CHLORIDE 0.9% FLUSH
10.0000 mL | INTRAVENOUS | Status: DC | PRN
  Administered 2017-04-21: 10 mL
  Filled 2017-04-21: qty 10

## 2017-04-21 MED ORDER — SODIUM CHLORIDE 0.9 % IV SOLN
Freq: Once | INTRAVENOUS | Status: AC
  Administered 2017-04-21: 11:00:00 via INTRAVENOUS

## 2017-04-21 MED ORDER — DEXAMETHASONE SODIUM PHOSPHATE 10 MG/ML IJ SOLN
10.0000 mg | Freq: Once | INTRAMUSCULAR | Status: AC
Start: 2017-04-21 — End: 2017-04-21
  Administered 2017-04-21: 10 mg via INTRAVENOUS
  Filled 2017-04-21: qty 1

## 2017-04-21 MED ORDER — DEXAMETHASONE SODIUM PHOSPHATE 10 MG/ML IJ SOLN
10.0000 mg | Freq: Once | INTRAMUSCULAR | Status: AC
  Administered 2017-04-21: 10 mg via INTRAVENOUS

## 2017-04-21 MED ORDER — DEXTROSE 5 % IV SOLN
50.0000 mg/m2 | Freq: Once | INTRAVENOUS | Status: AC
  Administered 2017-04-21: 96 mg via INTRAVENOUS
  Filled 2017-04-21: qty 16

## 2017-04-21 MED ORDER — HEPARIN SOD (PORK) LOCK FLUSH 100 UNIT/ML IV SOLN
INTRAVENOUS | Status: AC
  Filled 2017-04-21: qty 5

## 2017-04-21 NOTE — Progress Notes (Signed)
Labs reviewed with MD, proceed with first cycle of taxol, carbo.  Treatment given per orders. Patient tolerated it well without problems. Vitals stable and discharged home from clinic ambulatory. Follow up as scheduled.

## 2017-04-21 NOTE — Patient Instructions (Signed)
[facility] Discharge Instructions for Patients Receiving Chemotherapy   Beginning January 23rd 2017 lab work for the Cancer Center will be done in the  Main lab at [facility] on 1st floor. If you have a lab appointment with the Cancer Center please come in thru the  Main Entrance and check in at the main information desk   Today you received the following chemotherapy agents   To help prevent nausea and vomiting after your treatment, we encourage you to take your nausea medication     If you develop nausea and vomiting, or diarrhea that is not controlled by your medication, call the clinic.  The clinic phone number is [phone]. Office hours are Monday-Friday 8:30am-5:00pm.  BELOW ARE SYMPTOMS THAT SHOULD BE REPORTED IMMEDIATELY:  *FEVER GREATER THAN 101.0 F  *CHILLS WITH OR WITHOUT FEVER  NAUSEA AND VOMITING THAT IS NOT CONTROLLED WITH YOUR NAUSEA MEDICATION  *UNUSUAL SHORTNESS OF BREATH  *UNUSUAL BRUISING OR BLEEDING  TENDERNESS IN MOUTH AND THROAT WITH OR WITHOUT PRESENCE OF ULCERS  *URINARY PROBLEMS  *BOWEL PROBLEMS  UNUSUAL RASH Items with * indicate a potential emergency and should be followed up as soon as possible. If you have an emergency after office hours please contact your primary care physician or go to the nearest emergency department.  Please call the clinic during office hours if you have any questions or concerns.   You may also contact the Patient Navigator at [phone] should you have any questions or need assistance in obtaining follow up care.      Resources For Cancer Patients and their Caregivers ? American Cancer Society: Can assist with transportation, wigs, general needs, runs Look Good Feel Better.        1-888-227-6333 ? Cancer Care: Provides financial assistance, online support groups, medication/co-pay assistance.  1-800-813-HOPE (4673) ? [facility] Assists [place] cancer  patients and their families through emotional , educational and financial support.  336-427-4357 ? [place] DSS Where to apply for food stamps, Medicaid and utility assistance. 336-342-1394 ? RCATS: Transportation to medical appointments. 336-347-2287 ? Social Security Administration: May apply for disability if have a Stage IV cancer. 336-342-7796 1-800-772-1213 ? [place] Aging, Disability and Transit Services: Assists with nutrition, care and transit needs. 336-349-2343         

## 2017-04-22 ENCOUNTER — Other Ambulatory Visit (HOSPITAL_COMMUNITY): Payer: Self-pay | Admitting: Adult Health

## 2017-04-22 ENCOUNTER — Telehealth (HOSPITAL_COMMUNITY): Payer: Self-pay

## 2017-04-22 ENCOUNTER — Ambulatory Visit
Admission: RE | Admit: 2017-04-22 | Discharge: 2017-04-22 | Disposition: A | Discharge status: Home / Self Care | Payer: Medicare Other | Source: Ambulatory Visit | Attending: Radiation Oncology | Admitting: Radiation Oncology

## 2017-04-22 ENCOUNTER — Ambulatory Visit (HOSPITAL_COMMUNITY): Payer: Self-pay | Admitting: Internal Medicine

## 2017-04-22 DIAGNOSIS — C160 Malignant neoplasm of cardia: Secondary | ICD-10-CM | POA: Diagnosis not present

## 2017-04-22 DIAGNOSIS — Z510 Encounter for antineoplastic radiation therapy: Secondary | ICD-10-CM | POA: Diagnosis not present

## 2017-04-22 DIAGNOSIS — R638 Other symptoms and signs concerning food and fluid intake: Secondary | ICD-10-CM

## 2017-04-22 DIAGNOSIS — C155 Malignant neoplasm of lower third of esophagus: Secondary | ICD-10-CM | POA: Diagnosis not present

## 2017-04-22 NOTE — Telephone Encounter (Signed)
24 hour follow up -spoke with Denise-his cargiver. Stated he is doing good, had one episode of feeling light headed, instructed her to tell patient to get up slowly, take his time. Encourage him to drink plenty of fluids. No other complaints. Patient will be coming in on Friday for IVF hydration.

## 2017-04-23 ENCOUNTER — Encounter (HOSPITAL_COMMUNITY): Payer: Self-pay

## 2017-04-23 ENCOUNTER — Inpatient Hospital Stay (HOSPITAL_COMMUNITY): Payer: Medicare Other

## 2017-04-23 ENCOUNTER — Other Ambulatory Visit: Payer: Self-pay

## 2017-04-23 ENCOUNTER — Ambulatory Visit
Admission: RE | Admit: 2017-04-23 | Discharge: 2017-04-23 | Disposition: A | Discharge status: Home / Self Care | Payer: Medicare Other | Source: Ambulatory Visit | Attending: Radiation Oncology | Admitting: Radiation Oncology

## 2017-04-23 DIAGNOSIS — C159 Malignant neoplasm of esophagus, unspecified: Secondary | ICD-10-CM

## 2017-04-23 DIAGNOSIS — C160 Malignant neoplasm of cardia: Secondary | ICD-10-CM | POA: Diagnosis not present

## 2017-04-23 DIAGNOSIS — Z510 Encounter for antineoplastic radiation therapy: Secondary | ICD-10-CM | POA: Diagnosis not present

## 2017-04-23 DIAGNOSIS — C155 Malignant neoplasm of lower third of esophagus: Secondary | ICD-10-CM | POA: Diagnosis not present

## 2017-04-23 DIAGNOSIS — Z5111 Encounter for antineoplastic chemotherapy: Secondary | ICD-10-CM | POA: Diagnosis not present

## 2017-04-23 MED ORDER — HEPARIN SOD (PORK) LOCK FLUSH 100 UNIT/ML IV SOLN
500.0000 [IU] | Freq: Once | INTRAVENOUS | Status: AC
  Administered 2017-04-23: 500 [IU] via INTRAVENOUS

## 2017-04-23 MED ORDER — SODIUM CHLORIDE 0.9 % IV SOLN
INTRAVENOUS | Status: DC
  Administered 2017-04-23: 13:00:00 via INTRAVENOUS

## 2017-04-23 MED ORDER — SONAFINE EX EMUL
1.0000 "application " | Freq: Two times a day (BID) | CUTANEOUS | Status: DC
  Administered 2017-04-23: 1 via TOPICAL

## 2017-04-23 NOTE — Progress Notes (Signed)
  Radiation Oncology         [phone] ________________________________  Name: [name] MRN: [id number]  Date: 03/30/2017  DOB: Oct 17, 1950  SIMULATION AND TREATMENT PLANNING NOTE  DIAGNOSIS:     ICD-10-CM   1. 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  Identity was confirmed.  All relevant records and images related to the planned course of therapy were reviewed.   Written consent to proceed with treatment was confirmed which was freely given after reviewing the details related to the planned course of therapy had been reviewed with the patient.  Then, the patient was set-up in a stable reproducible  supine position for radiation therapy.  CT images were obtained.  Surface markings were placed.    Medically necessary complex treatment device(s) for immobilization:  Customized vac lock bag.   The CT images were loaded into the planning software.  Then the target and avoidance structures were contoured.  Treatment planning then occurred.  The radiation prescription was entered and confirmed.  I have requested : Intensity Modulated Radiotherapy (IMRT) is medically necessary for this case for the following reason:  Critical sparing of adjacent normal structures including the spinal cord, heart, and lungs.   The patient will undergo daily image guidance to ensure accurate localization of the target, and adequate minimize dose to the normal surrounding structures in close proximity to the target.   PLAN:  The patient will receive 45 Gy in 25 fractions initially as well as 50 gray in 25 fractions to the gross tumor volume using a simultaneous integrated boost technique. A boost will be performed in 3 fractions to yield a final total dose of 50.4 gray and 56 gray respectively.   Special treatment procedure The patient will also receive concurrent chemotherapy during the  treatment. The patient may therefore experience increased toxicity or side effects and the patient will be monitored for such problems. This may require extra lab work as necessary. This therefore constitutes a special treatment procedure.   ________________________________   Jodelle Gross, MD, PhD

## 2017-04-23 NOTE — Progress Notes (Addendum)
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  Patient receiving chemotherapy and radiation therapy.  Past medical history of bipolar, CHF, CAD, MI, DM.    Met with patient, cousin Langley Gauss and another friend today during IV fluids.  Patient reports good appetite and eating well.  1/2 of chicken salad sandwich eaten at bedside and 100% of potato chips eaten for lunch.  Reports had 3 eggs this am and 2 pieces of fish from [employer] before coming to clinic.  Reports that he drinks 2-3 boost original per day.  Likes ice cream.  Patient lives alone and uses microwave to cook.  Langley Gauss helps patient with meals as well.  Patient does most of grocery shopping.  Patient reports no nausea or side effects from chemotherapy.  Patient reports has been trying to stay away from bread and tough meats.  Langley Gauss says sometimes food gets hung and he has to throw it back up.    Reports no bowel movement in 3 days. Langley Gauss reports that she knows what she can give him per the MD advice   NUTRITION - FOCUSED PHYSICAL EXAM: deferred    MEDICATIONS: reviewed, takes lasix daily per patient.   LABS: reviewed   ANTHROPOMETRICS: Height:   64 inches Weight:  195 lb today BMI:  33 UBW:   190 lb per patient.  Noted at that weight on 11/03/16    Noted weight on 3/6 189 lb.  ESTIMATED ENERGY NEEDS:  Kcal:  1900-2200 calories/d Protein: 95-110 gm Fluid:  2.2 L/d   NUTRITION DIAGNOSIS:  Predicted suboptimal nutrient intake related to cancer and cancer related treatments, dysphagia as evidenced by other (comment)(pt limiting tough meats, breads due to esophageal mass).   DOCUMENTATION CODES:  Not applicable   INTERVENTION:   Discussed soft moist, high protein foods for patient to consume. Fact sheet provided Discussed chopping, grinding, pureeing foods if needed to easy with  swallowing.   Encouraged intake of nutritional shakes at least 2-3 per day.  1st complimentary case of ensure given today with coupons. Discussed strategies to help with constipation.  Discussed labs and weight gain concerns with RN.   Contact information given today   GOAL:  Patient will meet greater than or equal to 90% of their needs   MONITOR:  PO intake, Weight trends   Next Visit: March 15 during infusion   Eliseo Withers B. Zenia Resides, [place], [place] Registered Dietitian [phone] (pager)

## 2017-04-23 NOTE — Progress Notes (Signed)
Pt here for patient teaching.  Pt given Radiation and You booklet, skin care instructions and Sonafine.  Reviewed areas of pertinence such as fatigue, hair loss, mouth changes, skin changes, throat changes and taste changes . Pt able to give teach back of have Imodium on hand and drink plenty of water,apply Sonafine bid and avoid applying anything to skin within 4 hours of treatment. Pt verbalizes understanding of information given and will contact nursing with any questions or concerns.     [email or web address]

## 2017-04-23 NOTE — Progress Notes (Signed)
Tolerated IV hydration fluids w/o adverse effects.  Alert, in no distress.  Discharged ambulatory in c/o family.

## 2017-04-26 ENCOUNTER — Ambulatory Visit
Admission: RE | Admit: 2017-04-26 | Discharge: 2017-04-26 | Disposition: A | Discharge status: Home / Self Care | Payer: Medicare Other | Source: Ambulatory Visit | Attending: Radiation Oncology | Admitting: Radiation Oncology

## 2017-04-26 ENCOUNTER — Encounter: Payer: Self-pay | Admitting: Cardiothoracic Surgery

## 2017-04-26 DIAGNOSIS — C160 Malignant neoplasm of cardia: Secondary | ICD-10-CM | POA: Diagnosis not present

## 2017-04-26 NOTE — Progress Notes (Signed)
Patient came around following his treatment with some concerns for generalized weakness.  Patient and sister reassured that this is normal given where he is in his treatment course.  Today was fraction 6/28.  Vital signs stable with the exception of his BP being elevated.  Patient could not remember if he had taken his BP medication this morning.  He was advised to check when he got home to see if he had.  If he found that he had not taken it he was advised to take it.  His sister stated that his appetite is poor and they had spoke with the nutritionist on Friday 04/23/2017.  Informed of the importance of taking in enough calories and made him aware that decreased calorie intake could contribute to his weakness.  He stated that he drinks ensure when he doesn't eat much.  Advised that I would follow-up with him later in the week for his under-treat appointment and if he needed anything in between time to give the office a call.    BP (!) 145/109 (BP Location: Right Arm)   Pulse 94   Temp 97.8 F (36.6 C) (Oral)   Resp 16   SpO2 98%    Cori Razor, RN

## 2017-04-27 ENCOUNTER — Telehealth (HOSPITAL_COMMUNITY): Payer: Self-pay | Admitting: Emergency Medicine

## 2017-04-27 ENCOUNTER — Ambulatory Visit
Admission: RE | Admit: 2017-04-27 | Discharge: 2017-04-27 | Disposition: A | Discharge status: Home / Self Care | Payer: Medicare Other | Source: Ambulatory Visit | Attending: Radiation Oncology | Admitting: Radiation Oncology

## 2017-04-27 DIAGNOSIS — C160 Malignant neoplasm of cardia: Secondary | ICD-10-CM | POA: Diagnosis not present

## 2017-04-27 NOTE — Telephone Encounter (Signed)
Langley Gauss called and needed to move the pts chemo appt on 3/27 because radiation had changed the appt time that day.  Dannys chemo appt on 3/27 changed to 10:30 am.  Langley Gauss verbalized understanding.

## 2017-04-28 ENCOUNTER — Encounter (HOSPITAL_COMMUNITY): Payer: Self-pay | Admitting: Hematology

## 2017-04-28 ENCOUNTER — Ambulatory Visit (HOSPITAL_COMMUNITY): Payer: Self-pay | Admitting: Internal Medicine

## 2017-04-28 ENCOUNTER — Encounter (HOSPITAL_COMMUNITY): Payer: Self-pay

## 2017-04-28 ENCOUNTER — Inpatient Hospital Stay (HOSPITAL_COMMUNITY): Payer: Medicare Other

## 2017-04-28 ENCOUNTER — Ambulatory Visit (HOSPITAL_COMMUNITY): Payer: Self-pay | Admitting: Hematology

## 2017-04-28 ENCOUNTER — Inpatient Hospital Stay (HOSPITAL_BASED_OUTPATIENT_CLINIC_OR_DEPARTMENT_OTHER): Payer: Medicare Other | Admitting: Hematology

## 2017-04-28 ENCOUNTER — Ambulatory Visit
Admission: RE | Admit: 2017-04-28 | Discharge: 2017-04-28 | Disposition: A | Discharge status: Home / Self Care | Payer: Medicare Other | Source: Ambulatory Visit | Attending: Radiation Oncology | Admitting: Radiation Oncology

## 2017-04-28 VITALS — BP 103/74 | HR 91 | Temp 97.6°F | Resp 18 | Wt 188.2 lb

## 2017-04-28 VITALS — BP 110/61 | HR 94 | Temp 97.6°F | Resp 16 | Wt 188.2 lb

## 2017-04-28 DIAGNOSIS — I5022 Chronic systolic (congestive) heart failure: Secondary | ICD-10-CM | POA: Diagnosis not present

## 2017-04-28 DIAGNOSIS — Z86010 Personal history of colonic polyps: Secondary | ICD-10-CM

## 2017-04-28 DIAGNOSIS — Z5111 Encounter for antineoplastic chemotherapy: Secondary | ICD-10-CM | POA: Diagnosis not present

## 2017-04-28 DIAGNOSIS — I129 Hypertensive chronic kidney disease with stage 1 through stage 4 chronic kidney disease, or unspecified chronic kidney disease: Secondary | ICD-10-CM | POA: Diagnosis not present

## 2017-04-28 DIAGNOSIS — E7800 Pure hypercholesterolemia, unspecified: Secondary | ICD-10-CM | POA: Diagnosis not present

## 2017-04-28 DIAGNOSIS — C158 Malignant neoplasm of overlapping sites of esophagus: Secondary | ICD-10-CM

## 2017-04-28 DIAGNOSIS — I2510 Atherosclerotic heart disease of native coronary artery without angina pectoris: Secondary | ICD-10-CM | POA: Diagnosis not present

## 2017-04-28 DIAGNOSIS — N183 Chronic kidney disease, stage 3 (moderate): Secondary | ICD-10-CM

## 2017-04-28 DIAGNOSIS — E119 Type 2 diabetes mellitus without complications: Secondary | ICD-10-CM

## 2017-04-28 DIAGNOSIS — I252 Old myocardial infarction: Secondary | ICD-10-CM

## 2017-04-28 DIAGNOSIS — Z79899 Other long term (current) drug therapy: Secondary | ICD-10-CM

## 2017-04-28 DIAGNOSIS — I10 Essential (primary) hypertension: Secondary | ICD-10-CM

## 2017-04-28 DIAGNOSIS — I700 Atherosclerosis of aorta: Secondary | ICD-10-CM

## 2017-04-28 DIAGNOSIS — R011 Cardiac murmur, unspecified: Secondary | ICD-10-CM

## 2017-04-28 DIAGNOSIS — K219 Gastro-esophageal reflux disease without esophagitis: Secondary | ICD-10-CM | POA: Diagnosis not present

## 2017-04-28 DIAGNOSIS — Z8619 Personal history of other infectious and parasitic diseases: Secondary | ICD-10-CM

## 2017-04-28 DIAGNOSIS — F250 Schizoaffective disorder, bipolar type: Secondary | ICD-10-CM

## 2017-04-28 DIAGNOSIS — R112 Nausea with vomiting, unspecified: Secondary | ICD-10-CM | POA: Diagnosis not present

## 2017-04-28 DIAGNOSIS — G4733 Obstructive sleep apnea (adult) (pediatric): Secondary | ICD-10-CM | POA: Diagnosis not present

## 2017-04-28 DIAGNOSIS — C155 Malignant neoplasm of lower third of esophagus: Secondary | ICD-10-CM

## 2017-04-28 DIAGNOSIS — E139 Other specified diabetes mellitus without complications: Secondary | ICD-10-CM

## 2017-04-28 DIAGNOSIS — C160 Malignant neoplasm of cardia: Secondary | ICD-10-CM | POA: Diagnosis not present

## 2017-04-28 DIAGNOSIS — Z87442 Personal history of urinary calculi: Secondary | ICD-10-CM

## 2017-04-28 LAB — CBC WITH DIFFERENTIAL/PLATELET
BASOS ABS: 0 10*3/uL (ref 0.0–0.1)
Basophils Relative: 0 %
EOS PCT: 3 %
Eosinophils Absolute: 0.2 10*3/uL (ref 0.0–0.7)
HCT: 42.8 % (ref 39.0–52.0)
Hemoglobin: 14 g/dL (ref 13.0–17.0)
Lymphocytes Relative: 10 %
Lymphs Abs: 0.5 10*3/uL — ABNORMAL LOW (ref 0.7–4.0)
MCH: 28.1 pg (ref 26.0–34.0)
MCHC: 32.7 g/dL (ref 30.0–36.0)
MCV: 85.9 fL (ref 78.0–100.0)
Monocytes Absolute: 0.4 10*3/uL (ref 0.1–1.0)
Monocytes Relative: 8 %
Neutro Abs: 4.3 10*3/uL (ref 1.7–7.7)
Neutrophils Relative %: 79 %
PLATELETS: 112 10*3/uL — AB (ref 150–400)
RBC: 4.98 MIL/uL (ref 4.22–5.81)
RDW: 13.4 % (ref 11.5–15.5)
WBC: 5.4 10*3/uL (ref 4.0–10.5)

## 2017-04-28 LAB — COMPREHENSIVE METABOLIC PANEL
ALT: 13 U/L — AB (ref 17–63)
AST: 18 U/L (ref 15–41)
Albumin: 3.4 g/dL — ABNORMAL LOW (ref 3.5–5.0)
Alkaline Phosphatase: 125 U/L (ref 38–126)
Anion gap: 10 (ref 5–15)
BILIRUBIN TOTAL: 1 mg/dL (ref 0.3–1.2)
BUN: 24 mg/dL — AB (ref 6–20)
CO2: 26 mmol/L (ref 22–32)
CREATININE: 1.24 mg/dL (ref 0.61–1.24)
Calcium: 8.9 mg/dL (ref 8.9–10.3)
Chloride: 94 mmol/L — ABNORMAL LOW (ref 101–111)
GFR calc Af Amer: >60 mL/min (ref >60)
GFR, EST NON AFRICAN AMERICAN: 58 mL/min — AB (ref >60)
Glucose, Bld: 584 mg/dL (ref 65–99)
POTASSIUM: 4.8 mmol/L (ref 3.5–5.1)
Sodium: 130 mmol/L — ABNORMAL LOW (ref 135–145)
TOTAL PROTEIN: 6.5 g/dL (ref 6.5–8.1)

## 2017-04-28 MED ORDER — FAMOTIDINE IN NACL 20-0.9 MG/50ML-% IV SOLN
INTRAVENOUS | Status: AC
  Filled 2017-04-28: qty 50

## 2017-04-28 MED ORDER — PALONOSETRON HCL INJECTION 0.25 MG/5ML
INTRAVENOUS | Status: AC
  Filled 2017-04-28: qty 5

## 2017-04-28 MED ORDER — SODIUM CHLORIDE 0.9 % IV SOLN
190.4000 mg | Freq: Once | INTRAVENOUS | Status: AC
  Administered 2017-04-28: 190 mg via INTRAVENOUS
  Filled 2017-04-28: qty 19

## 2017-04-28 MED ORDER — DIPHENHYDRAMINE HCL 50 MG/ML IJ SOLN
INTRAMUSCULAR | Status: AC
  Filled 2017-04-28: qty 1

## 2017-04-28 MED ORDER — PALONOSETRON HCL INJECTION 0.25 MG/5ML
0.2500 mg | Freq: Once | INTRAVENOUS | Status: AC
  Administered 2017-04-28: 0.25 mg via INTRAVENOUS

## 2017-04-28 MED ORDER — INSULIN ASPART 100 UNIT/ML ~~LOC~~ SOLN
20.0000 [IU] | Freq: Once | SUBCUTANEOUS | Status: AC
  Administered 2017-04-28: 20 [IU] via SUBCUTANEOUS
  Filled 2017-04-28: qty 0.2

## 2017-04-28 MED ORDER — DEXAMETHASONE SODIUM PHOSPHATE 10 MG/ML IJ SOLN
10.0000 mg | Freq: Once | INTRAMUSCULAR | Status: AC
  Administered 2017-04-28: 10 mg via INTRAVENOUS

## 2017-04-28 MED ORDER — DEXAMETHASONE SODIUM PHOSPHATE 10 MG/ML IJ SOLN
INTRAMUSCULAR | Status: AC
  Filled 2017-04-28: qty 1

## 2017-04-28 MED ORDER — HEPARIN SOD (PORK) LOCK FLUSH 100 UNIT/ML IV SOLN
500.0000 [IU] | Freq: Once | INTRAVENOUS | Status: AC | PRN
  Administered 2017-04-28: 500 [IU]

## 2017-04-28 MED ORDER — SODIUM CHLORIDE 0.9% FLUSH
10.0000 mL | INTRAVENOUS | Status: DC | PRN
  Administered 2017-04-28: 10 mL
  Filled 2017-04-28: qty 10

## 2017-04-28 MED ORDER — SODIUM CHLORIDE 0.9 % IV SOLN
Freq: Once | INTRAVENOUS | Status: AC
  Administered 2017-04-28: 12:00:00 via INTRAVENOUS

## 2017-04-28 MED ORDER — SODIUM CHLORIDE 0.9 % IV SOLN: 10.0000 mg | Freq: Once | INTRAVENOUS | Status: DC

## 2017-04-28 MED ORDER — HEPARIN SOD (PORK) LOCK FLUSH 100 UNIT/ML IV SOLN
INTRAVENOUS | Status: AC
  Filled 2017-04-28: qty 5

## 2017-04-28 MED ORDER — DIPHENHYDRAMINE HCL 50 MG/ML IJ SOLN
50.0000 mg | Freq: Once | INTRAMUSCULAR | Status: AC
  Administered 2017-04-28: 50 mg via INTRAVENOUS

## 2017-04-28 MED ORDER — SODIUM CHLORIDE 0.9 % IV SOLN
50.0000 mg/m2 | Freq: Once | INTRAVENOUS | Status: AC
  Administered 2017-04-28: 96 mg via INTRAVENOUS
  Filled 2017-04-28: qty 16

## 2017-04-28 MED ORDER — FAMOTIDINE IN NACL 20-0.9 MG/50ML-% IV SOLN
20.0000 mg | Freq: Once | INTRAVENOUS | Status: AC
  Administered 2017-04-28: 20 mg via INTRAVENOUS

## 2017-04-28 NOTE — Patient Instructions (Signed)
[facility] Discharge Instructions for Patients Receiving Chemotherapy   Beginning January 23rd 2017 lab work for the Cancer Center will be done in the  Main lab at [facility] on 1st floor. If you have a lab appointment with the Cancer Center please come in thru the  Main Entrance and check in at the main information desk   Today you received the following chemotherapy agents Taxol and Carboplatin. Follow-up as scheduled. Call clinic for any questions or concerns  To help prevent nausea and vomiting after your treatment, we encourage you to take your nausea medication.   If you develop nausea and vomiting, or diarrhea that is not controlled by your medication, call the clinic.  The clinic phone number is [phone]. Office hours are Monday-Friday 8:30am-5:00pm.  BELOW ARE SYMPTOMS THAT SHOULD BE REPORTED IMMEDIATELY:  *FEVER GREATER THAN 101.0 F  *CHILLS WITH OR WITHOUT FEVER  NAUSEA AND VOMITING THAT IS NOT CONTROLLED WITH YOUR NAUSEA MEDICATION  *UNUSUAL SHORTNESS OF BREATH  *UNUSUAL BRUISING OR BLEEDING  TENDERNESS IN MOUTH AND THROAT WITH OR WITHOUT PRESENCE OF ULCERS  *URINARY PROBLEMS  *BOWEL PROBLEMS  UNUSUAL RASH Items with * indicate a potential emergency and should be followed up as soon as possible. If you have an emergency after office hours please contact your primary care physician or go to the nearest emergency department.  Please call the clinic during office hours if you have any questions or concerns.   You may also contact the Patient Navigator at [phone] should you have any questions or need assistance in obtaining follow up care.      Resources For Cancer Patients and their Caregivers ? American Cancer Society: Can assist with transportation, wigs, general needs, runs Look Good Feel Better.        1-888-227-6333 ? Cancer Care: Provides financial assistance, online support groups, medication/co-pay assistance.   1-800-813-HOPE (4673) ? [facility] Assists [place] cancer patients and their families through emotional , educational and financial support.  336-427-4357 ? [place] DSS Where to apply for food stamps, Medicaid and utility assistance. 336-342-1394 ? RCATS: Transportation to medical appointments. 336-347-2287 ? Social Security Administration: May apply for disability if have a Stage IV cancer. 336-342-7796 1-800-772-1213 ? [place] Aging, Disability and Transit Services: Assists with nutrition, care and transit needs. 336-349-2343         

## 2017-04-28 NOTE — Progress Notes (Signed)
1155 Labs reviewed with and pt seen by Dr. Delton Coombes and pt approved for chemo tx today with insulin to be given for Glucose of 584 per MD orders    [name] tolerated chemo tx well without complaints or incident. Constipation sheet given to and reviewed with pt for c/o constipation. Pt also instructed to try Glucerna instead of Ensure for needed supplement due to elevated glucose as well. Understanding verbalized. VSS upon discharge. Pt discharged self ambulatory in satisfactory condition accompanied by family member

## 2017-04-28 NOTE — Progress Notes (Signed)
[name], [name] [address]   CLINIC:  Medical Oncology/Hematology  PCP:  Sinda Du, MD [place] [phone]   REASON FOR VISIT:  Follow-up for second weekly chemotherapy.  CURRENT THERAPY: Weekly carboplatin and paclitaxel started on 04/21/2017.   INTERVAL HISTORY:  [name] 67 y.o. male returns for routine follow-up and consideration for next cycle of chemotherapy.  He is due for his second weekly dose of carboplatin and paclitaxel.  After his last cycle, he did not experience any major changes from his baseline.  He does have occasional vomiting and regurgitation when he tries to eat solid foods like fried fish.  He started drinking 2-3 cans of boost per day.  He reports that he had 2 cans of boost this morning.  His radiation therapy was started on 04/19/2017.  He is receiving at [facility].  He has a driver who is accompanying him today.  He does not recollect most of his medications.  He is not checking his blood sugars at home.  He denies any tingling or numbness in the extremities.  He denies any fevers.   REVIEW OF SYSTEMS:  Review of Systems  Constitutional: Positive for appetite change and fatigue.  HENT:   Positive for mouth sores.   Respiratory: Negative.   Cardiovascular: Negative.   Gastrointestinal: Positive for constipation and vomiting.  Genitourinary: Negative.    Musculoskeletal: Negative.   Skin: Negative.   Neurological: Positive for dizziness.  Hematological: Negative.   Psychiatric/Behavioral: Negative.      PAST MEDICAL/SURGICAL HISTORY:  Past Medical History:  Diagnosis Date  . Arthritis   . Bipolar disorder ([place])   . CAD (coronary artery disease) 08/16/2016  . Cellulitis and abscess of foot 03/05/2016  . Chronic combined systolic and diastolic heart failure ([place])   . Diabetes mellitus without complication ([place])    boderline  . GERD  (gastroesophageal reflux disease)   . Heart murmur 1995  . History of kidney stones   . Hypercholesteremia   . Hypertension   . Kidney stones   . Myocardial infarction ([place])   . OSA (obstructive sleep apnea)    no cpap, can't tolerate  . Schizoaffective disorder, bipolar type ([place]) 04/25/2016   Past Surgical History:  Procedure Laterality Date  . APPENDECTOMY    . BIOPSY  03/09/2017   Procedure: BIOPSY;  Surgeon: Danie Binder, MD;  Location[facility];  Service: Endoscopy;;  gastric esophageal  . CHOLECYSTECTOMY N/A 10/20/2013   Procedure: LAPAROSCOPIC CHOLECYSTECTOMY;  Surgeon: [name], MD;  Location: [facility] ORS;  Service: General;  Laterality: N/A;  . COLONOSCOPY WITH PROPOFOL N/A 03/09/2017   Procedure: COLONOSCOPY WITH PROPOFOL;  Surgeon: Danie Binder, MD;  Location: [facility];  Service: Endoscopy;  Laterality: N/A;  12:15pm  . ESOPHAGOGASTRODUODENOSCOPY (EGD) WITH PROPOFOL N/A 03/09/2017   Procedure: ESOPHAGOGASTRODUODENOSCOPY (EGD) WITH PROPOFOL;  Surgeon: Danie Binder, MD;  Location[facility];  Service: Endoscopy;  Laterality: N/A;  . EUS N/A 04/08/2017   Procedure: UPPER ENDOSCOPIC ULTRASOUND (EUS) RADIAL;  Surgeon: Milus Banister, MD;  Location: [facility];  Service: Endoscopy;  Laterality: N/A;  . HIP SURGERY Left   . KNEE SURGERY Left   . POLYPECTOMY  03/09/2017   Procedure: POLYPECTOMY;  Surgeon: Danie Binder, MD;  Location[facility];  Service: Endoscopy;;  colon   . PORTACATH PLACEMENT Left 04/07/2017   Procedure: INSERTION PORT-A-CATH  LEFT SUBCLAVIAN;  Surgeon: Virl Cagey, MD;  Location: [facility] ORS;  Service: General;  Laterality: Left;  . SAVORY DILATION N/A 03/09/2017   Procedure: SAVORY DILATION;  Surgeon: Danie Binder, MD;  Location[facility];  Service: Endoscopy;  Laterality: N/A;     SOCIAL HISTORY:  Social History   Socioeconomic History  . Marital status: Divorced    Spouse name: Not on file  . Number of children:  Not on file  . Years of education: Not on file  . Highest education level: Not on file  Social Needs  . Financial resource strain: Not on file  . Food insecurity - worry: Not on file  . Food insecurity - inability: Not on file  . Transportation needs - medical: Not on file  . Transportation needs - non-medical: Not on file  Occupational History  . Not on file  Tobacco Use  . Smoking status: Never Smoker  . Smokeless tobacco: Never Used  Substance and Sexual Activity  . Alcohol use: No  . Drug use: No  . Sexual activity: Not Currently    Birth control/protection: Implant  Other Topics Concern  . Not on file  Social History Narrative  . Not on file    FAMILY HISTORY:  Family History  Problem Relation Age of Onset  . Hypertension Mother   . Dementia Mother   . Cancer Mother        breast  . Colon cancer Neg Hx   . Colon polyps Neg Hx     CURRENT MEDICATIONS:  Outpatient Encounter Medications as of 04/28/2017  Medication Sig  . acetaminophen (TYLENOL) 325 MG tablet Take 650 mg by mouth every 6 (six) hours as needed.  . AMITIZA 24 MCG capsule Take 24 mcg by mouth 2 (two) times a week.   Marland Kitchen aspirin EC 81 MG tablet Take 81 mg by mouth daily.  Marland Kitchen CARBOPLATIN IV Inject into the vein.  Marland Kitchen dexamethasone (DECADRON) 4 MG tablet Take 2 tablets (8 mg total) by mouth daily. Start the day after chemotherapy for 2 days.  Marland Kitchen dexlansoprazole (DEXILANT) 60 MG capsule Take 1 capsule (60 mg total) by mouth daily.  . furosemide (LASIX) 40 MG tablet Alternate 40 mg one day and 20 mg the next (Patient taking differently: Take 20-40 mg by mouth See admin instructions. Alternate 40 mg one day and 20 mg the next)  . lidocaine (XYLOCAINE) 2 % solution   . lidocaine-prilocaine (EMLA) cream Apply a quarter size amount to affected area 1 hour prior to coming to chemotherapy. Do not rub in. Cover with plastic wrap.  . lidocaine-prilocaine (EMLA) cream Apply to affected area once  . linagliptin (TRADJENTA)  5 MG TABS tablet Take 5 mg by mouth daily.  . magic mouthwash w/lidocaine SOLN Take 5 mLs by mouth 4 (four) times daily as needed for mouth pain.  Marland Kitchen OLANZapine (ZYPREXA) 5 MG tablet Take 5 mg by mouth 2 (two) times daily.  . ondansetron (ZOFRAN ODT) 8 MG disintegrating tablet Take 1 tablet (8 mg total) by mouth every 8 (eight) hours as needed for nausea or vomiting.  . ondansetron (ZOFRAN) 8 MG tablet Take 1 tablet (8 mg total) by mouth every 8 (eight) hours as needed for nausea or vomiting.  Marland Kitchen oxyCODONE (ROXICODONE) 5 MG immediate release tablet Take 1 tablet (5 mg total) by mouth every 4 (four) hours as needed.  Marland Kitchen PACLitaxel (TAXOL IV) Inject into the vein.  . potassium chloride SA (K-DUR,KLOR-CON) 20 MEQ  tablet Take 20 mEq by mouth daily.  . prochlorperazine (COMPAZINE) 10 MG tablet Take 1 tablet (10 mg total) by mouth every 6 (six) hours as needed for nausea or vomiting.  . prochlorperazine (COMPAZINE) 10 MG tablet Take 1 tablet (10 mg total) by mouth every 6 (six) hours as needed (Nausea or vomiting).  . sacubitril-valsartan (ENTRESTO) 49-51 MG Take 1 tablet by mouth 2 (two) times daily.  . carvedilol (COREG) 12.5 MG tablet Take 1 tablet (12.5 mg total) by mouth 2 (two) times daily.  . rosuvastatin (CRESTOR) 10 MG tablet Take 1 tablet (10 mg total) by mouth daily.   Facility-Administered Encounter Medications as of 04/28/2017  Medication  . [COMPLETED] insulin aspart (novoLOG) injection 20 Units    ALLERGIES:  Allergies  Allergen Reactions  . Codeine Nausea And Vomiting  . Morphine And Related Nausea And Vomiting     PHYSICAL EXAM:  ECOG Performance status: 2  Vitals:   04/28/17 1049  BP: 103/74  Pulse: 91  Resp: 18  Temp: 97.6 F (36.4 C)  SpO2: 99%   Filed Weights   04/28/17 1049  Weight: 188 lb 3.2 oz (85.4 kg)    Physical Exam   LABORATORY DATA:  I have reviewed the labs as listed.  CBC    Component Value Date/Time   WBC 5.4 04/28/2017 1043   RBC 4.98  04/28/2017 1043   HGB 14.0 04/28/2017 1043   HCT 42.8 04/28/2017 1043   PLT 112 (L) 04/28/2017 1043   MCV 85.9 04/28/2017 1043   MCH 28.1 04/28/2017 1043   MCHC 32.7 04/28/2017 1043   RDW 13.4 04/28/2017 1043   LYMPHSABS 0.5 (L) 04/28/2017 1043   MONOABS 0.4 04/28/2017 1043   EOSABS 0.2 04/28/2017 1043   BASOSABS 0.0 04/28/2017 1043   CMP Latest Ref Rng & Units 04/28/2017 04/21/2017 03/19/2017  Glucose 65 - 99 mg/dL 584(HH) 428(H) 279(H)  BUN 6 - 20 mg/dL 24(H) 25(H) 18  Creatinine 0.61 - 1.24 mg/dL 1.24 1.17 1.34(H)  Sodium 135 - 145 mmol/L 130(L) 132(L) 134(L)  Potassium 3.5 - 5.1 mmol/L 4.8 4.2 3.7  Chloride 101 - 111 mmol/L 94(L) 96(L) 98(L)  CO2 22 - 32 mmol/L 26 25 25   Calcium 8.9 - 10.3 mg/dL 8.9 8.6(L) 8.9  Total Protein 6.5 - 8.1 g/dL 6.5 6.6 6.8  Total Bilirubin 0.3 - 1.2 mg/dL 1.0 0.8 0.5  Alkaline Phos 38 - 126 U/L 125 114 100  AST 15 - 41 U/L 18 16 18   ALT 17 - 63 U/L 13(L) 12(L) 12(L)    PENDING LABS:      ASSESSMENT & PLAN:   1.Distal esophageal adenocarcinoma: He was started on radiation therapy 04/19/2017 and received his first weekly carboplatin and paclitaxel on 04/21/2017.  He tolerated his first cycle very well.  He does have baseline regurgitation and vomiting which is stable.  He met with nutritionists last Friday.  2.  Diabetes: He is taking Tradjenta 5 mg daily.  He does not check his fingersticks at home.  His blood sugar is elevated above 500 today.  I will cut back on dexamethasone in the premedications to 10 mg today.  I have recommended getting an endocrinology consultation for optimal control of his diabetes during chemotherapy.  I have told him to discontinue boost and drink Glucerna instead.  He will also receive some insulin today.  3.  Nausea: He will use Compazine every 6 hours for the next 2 days and then as needed.  I have told  him to not to use Zofran as he is getting constipation.  He will use Zofran only if Compazine does not  work.     Derek Jack MD [place] [phone]

## 2017-04-28 NOTE — Patient Instructions (Signed)
[facility] Discharge Instructions  You saw Dr. K today.   Thank you for choosing [facility] at [facility] to provide your oncology and hematology care.  To afford each patient quality time with our provider, please arrive at least 15 minutes before your scheduled appointment time.   If you have a lab appointment with the Cancer Center please come in thru the  Main Entrance and check in at the main information desk  You need to re-schedule your appointment should you arrive 10 or more minutes late.  We strive to give you quality time with our providers, and arriving late affects you and other patients whose appointments are after yours.  Also, if you no show three or more times for appointments you may be dismissed from the clinic at the providers discretion.     Again, thank you for choosing [facility].  Our hope is that these requests will decrease the amount of time that you wait before being seen by our physicians.       _____________________________________________________________  Should you have questions after your visit to [facility], please contact our office at [phone] between the hours of 8:30 a.m. and 4:30 p.m.  Voicemails left after 4:30 p.m. will not be returned until the following business day.  For prescription refill requests, have your pharmacy contact our office.       Resources For Cancer Patients and their Caregivers ? American Cancer Society: Can assist with transportation, wigs, general needs, runs Look Good Feel Better.        1-888-227-6333 ? Cancer Care: Provides financial assistance, online support groups, medication/co-pay assistance.  1-800-813-HOPE (4673) ? [facility] Assists [place] cancer patients and their families through emotional , educational and financial support.  336-427-4357 ? [place] DSS Where to apply for food  stamps, Medicaid and utility assistance. 336-342-1394 ? RCATS: Transportation to medical appointments. 336-347-2287 ? Social Security Administration: May apply for disability if have a Stage IV cancer. 336-342-7796 1-800-772-1213 ? [place] Aging, Disability and Transit Services: Assists with nutrition, care and transit needs. 336-349-2343  Cancer Center Support Programs:   > Cancer Support Group  2nd Tuesday of the month 1pm-2pm, Journey Room   > Creative Journey  3rd Tuesday of the month 1130am-1pm, Journey Room     

## 2017-04-28 NOTE — Progress Notes (Signed)
CRITICAL VALUE ALERT Critical value received:  Glucose-584 Date of notification:  04/28/17 Time of notification: 8916 Critical value read back:  Yes.   Nurse who received alert:  M.Yovani Cogburn,LPN MD notified (1st page):  S.Katragadda, MD

## 2017-04-29 ENCOUNTER — Institutional Professional Consult (permissible substitution) (INDEPENDENT_AMBULATORY_CARE_PROVIDER_SITE_OTHER): Payer: Medicare Other | Admitting: Cardiothoracic Surgery

## 2017-04-29 ENCOUNTER — Ambulatory Visit
Admission: RE | Admit: 2017-04-29 | Discharge: 2017-04-29 | Disposition: A | Discharge status: Home / Self Care | Payer: Medicare Other | Source: Ambulatory Visit | Attending: Radiation Oncology | Admitting: Radiation Oncology

## 2017-04-29 VITALS — BP 113/73 | HR 89 | Resp 20 | Ht 64.0 in | Wt 190.0 lb

## 2017-04-29 DIAGNOSIS — I25118 Atherosclerotic heart disease of native coronary artery with other forms of angina pectoris: Secondary | ICD-10-CM | POA: Diagnosis not present

## 2017-04-29 DIAGNOSIS — C160 Malignant neoplasm of cardia: Secondary | ICD-10-CM

## 2017-04-29 NOTE — Progress Notes (Signed)
[address]       [place] [address]             [phone]                    [name]  Medical Record #[id number] Date of Birth: 1950-09-12  Referring: Creola Corn, MD Primary Care: Sinda Du, MD Primary Cardiologist: Kate Sable, MD  Chief Complaint:    Chief Complaint  Patient presents with  . Esophageal Cancer    Surgical eval,     History of Present Illness:    [name] 67 y.o. 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  Patient notes starting in June 2018 having increasing trouble eating.  He noted this progressed to the point where he was only able to eat ice cream.  He denies any specific weight loss.  Notes that he would have episodes of rapid vomiting after he eats.  In February 2019 he underwent endoscopy showing a distal esophageal lesion follow-up upper GI endoscopy with EUS was performed February 22.  The patient is started on course of radiation and chemotherapy for distal esophageal cancer.  Last day of chemotherapy is scheduled for April 17 in the last day of radiation April 10.      Current Activity/ Functional Status:  Patient is independent with mobility/ambulation, transfers, ADL's, IADL's.   Zubrod Score: At the time of surgery this patient's most appropriate activity status/level should be described as: []     0    Normal activity, no symptoms [x]     1    Restricted in physical strenuous activity but ambulatory, able to do out light work []     2    Ambulatory and capable of self care, unable to do work activities, up and about               >50 % of waking hours                              []     3    Only limited self care, in bed greater than 50% of waking hours []     4    Completely disabled, no self care, confined to bed or chair []     5    Moribund   Past Medical History:  Diagnosis Date  .  Arthritis   . Bipolar disorder ([place])   . CAD (coronary artery disease) 08/16/2016  . Cellulitis and abscess of foot 03/05/2016  . Chronic combined systolic and diastolic heart failure ([place])   . Diabetes mellitus without complication ([place])    boderline  . GERD (gastroesophageal reflux disease)   . Heart murmur 1995  . History of kidney stones   . Hypercholesteremia   . Hypertension   . Kidney stones   . Myocardial infarction ([place])   . OSA (obstructive sleep apnea)    no cpap, can't tolerate  . Schizoaffective disorder, bipolar type ([place]) 04/25/2016    Past Surgical History:  Procedure Laterality Date  . APPENDECTOMY    . BIOPSY  03/09/2017   Procedure: BIOPSY;  Surgeon: Danie Binder, MD;  [facility];  Service: Endoscopy;;  gastric esophageal  . CHOLECYSTECTOMY N/A 10/20/2013   Procedure: LAPAROSCOPIC CHOLECYSTECTOMY;  Surgeon: [name], MD;  Location: [facility];  Service: General;  Laterality: N/A;  . COLONOSCOPY WITH PROPOFOL N/A 03/09/2017   Procedure: COLONOSCOPY WITH PROPOFOL;  Surgeon: Danie Binder, MD;  [facility];  Service: Endoscopy;  Laterality: N/A;  12:15pm  . ESOPHAGOGASTRODUODENOSCOPY (EGD) WITH PROPOFOL N/A 03/09/2017   Procedure: ESOPHAGOGASTRODUODENOSCOPY (EGD) WITH PROPOFOL;  Surgeon: Danie Binder, MD;  [facility];  Service: Endoscopy;  Laterality: N/A;  . EUS N/A 04/08/2017   Procedure: UPPER ENDOSCOPIC ULTRASOUND (EUS) RADIAL;  Surgeon: Milus Banister, MD;  Location: [facility];  Service: Endoscopy;  Laterality: N/A;  . HIP SURGERY Left   . KNEE SURGERY Left   . POLYPECTOMY  03/09/2017   Procedure: POLYPECTOMY;  Surgeon: Danie Binder, MD;  [facility];  Service: Endoscopy;;  colon   . PORTACATH PLACEMENT Left 04/07/2017   Procedure: INSERTION PORT-A-CATH LEFT SUBCLAVIAN;  Surgeon: Virl Cagey, MD;  Location: [facility];  Service: General;  Laterality: Left;  . SAVORY DILATION N/A 03/09/2017    Procedure: SAVORY DILATION;  Surgeon: Danie Binder, MD;  [facility];  Service: Endoscopy;  Laterality: N/A;    Family History  Problem Relation Age of Onset  . Hypertension Mother   . Dementia Mother   . Cancer Mother        breast  . Colon cancer Neg Hx   . Colon polyps Neg Hx     Social History   Socioeconomic History  . Marital status: Divorced    Spouse name: Not on file  . Number of children: Not on file  . Years of education: Not on file  . Highest education level: Not on file  Social Needs  . Financial resource strain: Not on file  . Food insecurity - worry: Not on file  . Food insecurity - inability: Not on file  . Transportation needs - medical: Not on file  . Transportation needs - non-medical: Not on file  Occupational History  . Not on file  Tobacco Use  . Smoking status: Never Smoker  . Smokeless tobacco: Never Used  Substance and Sexual Activity  . Alcohol use: No  . Drug use: No  . Sexual activity: Not Currently    Birth control/protection: Implant  Other Topics Concern  . Not on file  Social History Narrative  . Not on file    Social History   Tobacco Use  Smoking Status Never Smoker  Smokeless Tobacco Never Used    Social History   Substance and Sexual Activity  Alcohol Use No     Allergies  Allergen Reactions  . Codeine Nausea And Vomiting  . Morphine And Related Nausea And Vomiting    Current Outpatient Medications  Medication Sig Dispense Refill  . acetaminophen (TYLENOL) 325 MG tablet Take 650 mg by mouth every 6 (six) hours as needed.    . AMITIZA 24 MCG capsule Take 24 mcg by mouth 2 (two) times a week.     Marland Kitchen aspirin EC 81 MG tablet Take 81 mg by mouth daily.    Marland Kitchen CARBOPLATIN IV Inject into the vein.    Marland Kitchen dexamethasone (DECADRON) 4 MG tablet Take 2 tablets (8 mg total) by mouth daily. Start the day after chemotherapy for 2 days. 30 tablet 1  . dexlansoprazole (DEXILANT) 60 MG capsule Take 1 capsule (60 mg  total) by mouth daily. 90 capsule 3  . furosemide (LASIX) 40 MG tablet Alternate 40 mg one day and 20 mg the  next (Patient taking differently: Take 20-40 mg by mouth See admin instructions. Alternate 40 mg one day and 20 mg the next) 90 tablet 3  . lidocaine (XYLOCAINE) 2 % solution     . lidocaine-prilocaine (EMLA) cream Apply a quarter size amount to affected area 1 hour prior to coming to chemotherapy. Do not rub in. Cover with plastic wrap. 30 g 2  . linagliptin (TRADJENTA) 5 MG TABS tablet Take 5 mg by mouth daily.    . magic mouthwash w/lidocaine SOLN Take 5 mLs by mouth 4 (four) times daily as needed for mouth pain. 600 mL 1  . OLANZapine (ZYPREXA) 5 MG tablet Take 5 mg by mouth 2 (two) times daily.    . ondansetron (ZOFRAN ODT) 8 MG disintegrating tablet Take 1 tablet (8 mg total) by mouth every 8 (eight) hours as needed for nausea or vomiting. 40 tablet 2  . oxyCODONE (ROXICODONE) 5 MG immediate release tablet Take 1 tablet (5 mg total) by mouth every 4 (four) hours as needed. 10 tablet 0  . PACLitaxel (TAXOL IV) Inject into the vein.    . potassium chloride SA (K-DUR,KLOR-CON) 20 MEQ tablet Take 20 mEq by mouth daily.    . prochlorperazine (COMPAZINE) 10 MG tablet Take 1 tablet (10 mg total) by mouth every 6 (six) hours as needed (Nausea or vomiting). 30 tablet 1  . rosuvastatin (CRESTOR) 10 MG tablet Take 1 tablet (10 mg total) by mouth daily. 90 tablet 3  . sacubitril-valsartan (ENTRESTO) 49-51 MG Take 1 tablet by mouth 2 (two) times daily. 60 tablet 6   No current facility-administered medications for this visit.     Pertinent items are noted in HPI.   Review of Systems:     Cardiac Review of Systems: [Y] = yes  or   [  ] = no   Chest Pain [  n  ]  Resting SOB [ n  ] Exertional SOB  [n ]  Orthopnea [ n ]   Pedal Edema [n   ]    Palpitations [ n ] Syncope  [ n ]   Presyncope [  n ]  General Review of Systems: [Y] = yes [  ]=no Constitional: recent weight change [n];  Wt loss  over the last 3 months [   ] anorexia [  ]; fatigue [  ]; nausea [  ]; night sweats [  ]; fever [  ]; or chills [  ];          Dental: poor dentition[  ]; Last Dentist visit:   Eye : blurred vision [  ]; diplopia [   ]; vision changes [  ];  Amaurosis fugax[  ]; Resp: cough [  ];  wheezing[  ];  hemoptysis[  ]; shortness of breath[  ]; paroxysmal nocturnal dyspnea[  ]; dyspnea on exertion[  ]; or orthopnea[  ];  GI:  gallstones[  ], vomiting[  ];  dysphagia[  ]; melena[n  ];  hematochezia [n  ]; heartburn[  ];   Hx of  Colonoscopy[  y]; GU: kidney stones [  ]; hematuria[  ];   dysuria [  ];  nocturia[  ];  history of     obstruction [  ]; urinary frequency [  ]             Skin: rash, swelling[  ];, hair loss[  ];  peripheral edema[  ];  or itching[  ]; Musculosketetal: myalgias[  ];  joint  swelling[  ];  joint erythema[  ];  joint pain[  ];  back pain[  ];  Heme/Lymph: bruising[  ];  bleeding[  ];  anemia[  ];  Neuro: TIA[  ];  headaches[  ];  stroke[  ];  vertigo[  ];  seizures[  ];   paresthesias[  ];  difficulty walking[  ];  Psych:depression[ y ]; anxiety[ y ];  Endocrine: diabetes[y  ];  thyroid dysfunction[ n ];  Immunizations: Flu up to date [ y ]; Pneumococcal up to date [ y ];  Other:  Physical Exam: BP 113/73   Pulse 89   Resp 20   Ht 5\' 4"  (1.626 m)   Wt 190 lb (86.2 kg)   SpO2 98% Comment: RA  BMI 32.61 kg/m   PHYSICAL EXAMINATION: General appearance: alert, cooperative and no distress Head: Normocephalic, without obvious abnormality, atraumatic Neck: no adenopathy, no carotid bruit, no JVD, supple, symmetrical, trachea midline and thyroid not enlarged, symmetric, no tenderness/mass/nodules Lymph nodes: Cervical, supraclavicular, and axillary nodes normal. Resp: clear to auscultation bilaterally Back: symmetric, no curvature. ROM normal. No CVA tenderness. Cardio: regular rate and rhythm, S1, S2 normal, no murmur, click, rub or gallop GI: soft, non-tender; bowel sounds  normal; no masses,  no organomegaly Extremities: extremities normal, atraumatic, no cyanosis or edema and Homans sign is negative, no sign of DVT Neurologic: Grossly normal Patient does have slow mentation and answers questions appropriately Left subclavian port is in place  Diagnostic Studies & Laboratory data:     Recent Radiology Findings:  CLINICAL DATA:  Initial. Treatment strategy for esophageal adenocarcinoma.  EXAM: NUCLEAR MEDICINE PET SKULL BASE TO THIGH  TECHNIQUE: 12.7 mCi F-18 FDG was injected intravenously. Full-ring PET imaging was performed from the skull base to thigh after the radiotracer. CT data was obtained and used for attenuation correction and anatomic localization.  FASTING BLOOD GLUCOSE:  Value: 187 mg/dl  COMPARISON:  None.  FINDINGS: NECK: No hypermetabolic lymph nodes in the neck.  CHEST: Mild cardiac enlargement. LAD coronary artery calcifications noted.  The trachea appears patent and midline. Distal esophageal mass is identified measuring 7.6 cm in length within SUV max equal to 15.68. This extends from the level of the left inferior pulmonary vein to just above the diaphragmatic hiatus.  No hypermetabolic mediastinal lymph nodes or hilar lymph nodes identified. No axillary or supraclavicular adenopathy.  No pleural effusions identified.  No pulmonary nodules or masses.  ABDOMEN/PELVIS: No abnormal hypermetabolic activity within the liver, pancreas, adrenal glands, or spleen. No hypermetabolic lymph nodes in the abdomen or pelvis. Left-sided inferior vena cava. Aortic atherosclerosis.  SKELETON: No focal hypermetabolic activity to suggest skeletal metastasis.  IMPRESSION: 1. Mass involving the distal esophagus measures approximately 7.6 cm in length and exhibits intense FDG uptake compatible with primary esophageal neoplasm. 2. No evidence for solid organ metastasis and no hypermetabolic lymph nodes identified. 3.  Aortic Atherosclerosis (ICD10-I70.0). Lad coronary artery calcifications noted.   Electronically Signed   By: Kerby Moors M.D.   On: 03/24/2017 12:54 CLINICAL DATA:  Neoplasm of digestive system: Gastric, staging.  EXAM: CT CHEST, ABDOMEN, AND PELVIS WITH CONTRAST  TECHNIQUE: Multidetector CT imaging of the chest, abdomen and pelvis was performed following the standard protocol during bolus administration of intravenous contrast.  CONTRAST:  125mL ISOVUE-300 IOPAMIDOL (ISOVUE-300) INJECTION 61%  COMPARISON:  CT urogram 12/15/2007. Reports from colonoscopy and upper endoscopy 03/09/2017. Esophageal and gastric adenocarcinoma on biopsies.  FINDINGS: CT CHEST FINDINGS  Cardiovascular: Mild atherosclerosis of  the aorta, great vessels and coronary arteries. No acute vascular findings on noncontrast imaging. Probable aortic valvular calcifications. The heart size is normal. There is no pericardial effusion.  Mediastinum/Nodes: There are no enlarged mediastinal, hilar or axillary lymph nodes. The thyroid gland and trachea appear normal. There is an irregular mass involving the distal 3rd of the esophagus, extending approximately 8 cm in length on sagittal image 116. No mediastinal extension of tumor, significant proximal esophageal distension or adjacent lymphadenopathy.  Lungs/Pleura: No pleural effusion or pneumothorax. The lungs are essentially clear without suspicious pulmonary nodules.  Musculoskeletal/Chest wall: No chest wall mass or suspicious osseous findings.  CT ABDOMEN AND PELVIS FINDINGS  Hepatobiliary: The liver is normal in density without focal abnormality. Previous cholecystectomy without significant biliary dilatation.  Pancreas: Unremarkable. No pancreatic ductal dilatation or surrounding inflammatory changes.  Spleen: Normal in size without focal abnormality.  Adrenals/Urinary Tract: Both adrenal glands appear normal.  Chronic asymmetric renal cortical thinning on the left. There are small renal cysts bilaterally. No evidence of renal mass, hydronephrosis or ureteral calculus. The bladder appears normal.  Stomach/Bowel: As above, there is a distal esophageal mass which extends to the gastroesophageal junction. No definite mass in the gastric cardia or fundus. The stomach is incompletely distended and suboptimally evaluated. No evidence of bowel wall thickening, significant distention or surrounding inflammation. Moderate stool throughout the colon. Probable previous appendectomy.  Vascular/Lymphatic: There are no enlarged abdominal or pelvic lymph nodes. There are small celiac and porta hepatis lymph nodes, measuring up to 9 on image 55. There is diffuse aortic and branch vessel atherosclerosis with intimal irregularity in the abdominal aorta. No acute vascular findings.  Reproductive: The prostate gland and seminal vesicles demonstrate no significant findings.  Other: Small umbilical hernia containing only fat.  No ascites.  Musculoskeletal: No acute or significant osseous findings. Presumed postsurgical changes in the left iliac crest, stable. Mild lumbar spondylosis.  IMPRESSION: 1. Large intraluminal mass in the distal esophagus consistent with adenocarcinoma. No esophageal obstruction, mediastinal extension of tumor or definite metastatic disease. No definite extension of tumor in the stomach visualized. 2. Small upper abdominal lymph nodes, nonspecific. No enlarged lymph nodes identified. 3. Chronic left renal cortical thinning. 4.  Aortic Atherosclerosis (ICD10-I70.0).   Electronically Signed   By: Richardean Sale M.D.   On: 03/12/2017 16:07   I have independently reviewed the above radiology studies  and reviewed the findings with the patient. Endoscopic Finding Findings: 1. Friable, nearly obstructing, large mass, circumferential (at most distal aspect) with proximal  edge located 32cm from the incisors and distal edge located at 39cm (just below the GE junction). I was able to advance the large diameter (1cm) radial echoendoscope throught the malignant stricture with minor resistence. 2. 1.5cm firm nodule, ulcerated, clearly malignant, located 2cm distal to the GE junction in the cardia of the stomach. 3. Otherwise normal examination. Endosonographic Finding 1. The GE junction mass described above correlated with a bulky, hypoechoic, heterogeneous mas that clearly passed into and through the muscularis proprial layer (at the distal site of circumferential growth). (uT3). 2. I could not evaluate the satellite lesion due to it's very proximal gastric location. 3. No paraesophaegal adenopathy. (uN0). 4. Limited views of liver, pancreas were no  Recent Lab Findings: Lab Results  Component Value Date   WBC 5.4 04/28/2017   HGB 14.0 04/28/2017   HCT 42.8 04/28/2017   PLT 112 (L) 04/28/2017   GLUCOSE 584 (HH) 04/28/2017   CHOL 182 08/17/2016   TRIG  186 (H) 08/17/2016   HDL 31 (L) 08/17/2016   LDLCALC 114 (H) 08/17/2016   ALT 13 (L) 04/28/2017   AST 18 04/28/2017   NA 130 (L) 04/28/2017   K 4.8 04/28/2017   CL 94 (L) 04/28/2017   CREATININE 1.24 04/28/2017   BUN 24 (H) 04/28/2017   CO2 26 04/28/2017   TSH 1.045 08/16/2016   INR 1.11 08/17/2016   HGBA1C 8.0 (H) 08/16/2016   PATH: Diagnosis 1. Colon, polyp(s), cecal, proximal transverse, splenic flexure, sigmoid - TUBULAR ADENOMA (X9 FRAGMENTS). - NO HIGH GRADE DYSPLASIA OR MALIGNANCY. 2. Colon, polyp(s), cecal - POLYPOID ARTERIOVENOUS MALFORMATION. - NO DYSPLASIA OR MALIGNANCY. 3. Stomach, biopsy, and gastric polyp - FUNDIC GLAND POLYPS. - FRAGMENT WITH MILD REACTIVE GASTROPATHY. - NEGATIVE FOR HELICOBACTER PYLORI. - NO INTESTINAL METAPLASIA, DYSPLASIA, OR MALIGNANCY. 4. Stomach, biopsy - ADENOCARCINOMA. 5. Esophagus, biopsy - ADENOCARCINOMA. 1 of   Assessment / Plan:   1/  7cm  long, bulky, focally circumferential (distally), uT3N0M0 (Stage II) GE junction adenocarcinoma with nearby satellite lesion 2cm distal to the GE junction 2/ moderate aortic stenosis was severe LV dysfunction question of previous anterior myocardial infarction no previous cardiac catheterization has been done   Patient appears to have at least stage II carcinoma of the GE junction with a satellite lesion in the fundus of the stomach.  He is currently undergoing chemo and radiation treatment.  At this point the patient could be considered a surgical candidate but repeat scans will need to be done at the completion of therapy before proceeding with surgery.  In addition the patient's underlying heart failure and poor LV function will need to be further evaluated to adequately risk stratify his cardiac risk for surgery I will plan to see him back at the completion of chemotherapy and radiation and then will arrange for further restaging scans Published material concerning for esophageal resections been given to the patient and his cousin who is his caregiver  I  spent 60 minutes counseling the patient and his cousin face to face and reviewing scans and possible surgical treatment  Grace Isaac MD      [address] [place] [address] Office [phone]   Beeper [phone]  04/29/2017 1:15 PM

## 2017-04-30 ENCOUNTER — Inpatient Hospital Stay (HOSPITAL_COMMUNITY): Payer: Medicare Other

## 2017-04-30 ENCOUNTER — Encounter (HOSPITAL_COMMUNITY): Payer: Self-pay

## 2017-04-30 ENCOUNTER — Ambulatory Visit
Admission: RE | Admit: 2017-04-30 | Discharge: 2017-04-30 | Disposition: A | Discharge status: Home / Self Care | Payer: Medicare Other | Source: Ambulatory Visit | Attending: Radiation Oncology | Admitting: Radiation Oncology

## 2017-04-30 DIAGNOSIS — C160 Malignant neoplasm of cardia: Secondary | ICD-10-CM

## 2017-04-30 DIAGNOSIS — R638 Other symptoms and signs concerning food and fluid intake: Secondary | ICD-10-CM

## 2017-04-30 DIAGNOSIS — C159 Malignant neoplasm of esophagus, unspecified: Secondary | ICD-10-CM

## 2017-04-30 DIAGNOSIS — Z5111 Encounter for antineoplastic chemotherapy: Secondary | ICD-10-CM | POA: Diagnosis not present

## 2017-04-30 MED ORDER — HEPARIN SOD (PORK) LOCK FLUSH 100 UNIT/ML IV SOLN
500.0000 [IU] | Freq: Once | INTRAVENOUS | Status: DC | PRN
  Filled 2017-04-30: qty 5

## 2017-04-30 MED ORDER — SODIUM CHLORIDE 0.9% FLUSH
10.0000 mL | INTRAVENOUS | Status: DC | PRN
  Administered 2017-04-30: 10 mL
  Filled 2017-04-30: qty 10

## 2017-04-30 MED ORDER — SODIUM CHLORIDE 0.9 % IV SOLN
Freq: Once | INTRAVENOUS | Status: AC
  Administered 2017-04-30: 11:00:00 via INTRAVENOUS

## 2017-04-30 NOTE — Patient Instructions (Signed)
[place] at [facility] Discharge Instructions  One liter of fluids given today  Follow up as scheduled.  Thank you for choosing [place] at [facility] to provide your oncology and hematology care.  To afford each patient quality time with our provider, please arrive at least 15 minutes before your scheduled appointment time.   If you have a lab appointment with the [facility] please come in thru the  Main Entrance and check in at the main information desk  You need to re-schedule your appointment should you arrive 10 or more minutes late.  We strive to give you quality time with our providers, and arriving late affects you and other patients whose appointments are after yours.  Also, if you no show three or more times for appointments you may be dismissed from the clinic at the providers discretion.     Again, thank you for choosing [facility].  Our hope is that these requests will decrease the amount of time that you wait before being seen by our physicians.       _____________________________________________________________  Should you have questions after your visit to [facility], please contact our office at [phone] between the hours of 8:30 a.m. and 4:30 p.m.  Voicemails left after 4:30 p.m. will not be returned until the following business day.  For prescription refill requests, have your pharmacy contact our office.       Resources For Cancer Patients and their Caregivers ? American Cancer Society: Can assist with transportation, wigs, general needs, runs Look Good Feel Better.        ([phone] ? Cancer Care: Provides financial assistance, online support groups, medication/co-pay assistance.  1-800-813-HOPE 8586358387) ? [place] Assists [place] cancer patients and their families through emotional , educational and financial support.  6717500071 ? [place] DSS Where to apply for food stamps, Medicaid and utility assistance. (805) 562-4156 ? [facility]: Transportation to medical appointments. 630-368-5280 ? Social Security Administration: May apply for disability if have a Stage IV cancer. 671 167 [phone] ? [employer], Disability and Transit Services: Assists with nutrition, care and transit needs. [place] Support Programs:   > Cancer Support Group  2nd Tuesday of the month 1pm-2pm, Journey Room   > Creative Journey  3rd Tuesday of the month 1130am-1pm, Journey Room

## 2017-04-30 NOTE — Progress Notes (Signed)
One liter of fluids given today per orders. Patient tolerated it well, no issues at this time.   Vitals stable and discharged home from clinic ambulatory.

## 2017-04-30 NOTE — Progress Notes (Signed)
Nutrition Follow-up  Patient with esophageal cancer extending to fundus.  Patient receiving chemotherapy and radiation therapy.    Met with patient during IV fluids this am.  [name] or other friends not with him today during infusion.  Spoke with patient about appetite and blood glucose.  Reports he does not check blood glucose at home as it hurts his fingers.  Reports that he takes diabetes medication as prescribed.  Reports that yesterday he ate 3 eggs scrambled with 4 slices of bacon, drank coffee with creamer, no sugar and sweet tea and water.  For lunch ate some banana pudding cup and for supper butter scotch pudding cup. Drinks sweet tea and regular gingerale during the day.  During infusion today patient eating peanut butter crackers and 1/2 chicken salad sandwich.  Reports he drank 3 shakes yesterday, not sure which kind (ie ensure/boost/glucerna).     MEDICATIONS: decadron has been stopped and dose reduced on chemo days.    LABS: glucose 548 (day of chemo)   ANTHROPOMETRICS:  Noted weight 190 lb on 3/14 in clinic.  UBW 190 lb  Per patient.    ESTIMATED ENERGY NEEDS:  Kcal:   1900-2000 calories Protein:  95-110 g/d Fluid:   2.2 L/d   NUTRITION DIAGNOSIS:    Predicted suboptimal nutrient intake related to  cancer and cancer related treatments, dysphagia as evidenced by  patient limiting tough meats, breads due to esophageal mass, ongoing   DOCUMENTATION CODES:   Not applicable   INTERVENTION:   Strongly encouraged patient to start checking blood glucose on regular basis especially with high blood glucose readings recently.   Noted MD encouraged patient to meet with endocrinologist to better manage blood glucose. Encouraged patient to switch to sugar free beverages and provided alternatives to better control blood glucose.  Discussed soft, moist foods high in protein and calories but lower in carbohydrate. Sheet given and highlighted with foods to choose. Talked about  meals for the microwave that patient can purchase in the grocery store as options for lunch and dinner.    Discussed oral nutrition supplements and gave patient a list of supplements that are lower in carbohydrate.  At this time these would be appropriate for patient to help with blood glucose.  If patient weight begins to drop would need to consider switching back to high calorie shakes (350 calories) to maintain weight and nutrition through treatment and control blood glucose with medication vs limiting diet choices.    GOAL:   Patient will meet greater than or equal to 90% of their needs   MONITOR:   Po intake, weight trends   Next Visit: March 22 during infusion   [name], [place], [place] Registered Dietitian [phone] (pager)

## 2017-05-02 ENCOUNTER — Ambulatory Visit: Payer: Medicare Other

## 2017-05-03 ENCOUNTER — Ambulatory Visit
Admission: RE | Admit: 2017-05-03 | Discharge: 2017-05-03 | Disposition: A | Discharge status: Home / Self Care | Payer: Medicare Other | Source: Ambulatory Visit | Attending: Radiation Oncology | Admitting: Radiation Oncology

## 2017-05-03 ENCOUNTER — Inpatient Hospital Stay (HOSPITAL_COMMUNITY): Payer: Medicare Other

## 2017-05-03 ENCOUNTER — Encounter (HOSPITAL_COMMUNITY): Payer: Self-pay

## 2017-05-03 ENCOUNTER — Ambulatory Visit (HOSPITAL_COMMUNITY): Payer: Self-pay | Admitting: Internal Medicine

## 2017-05-03 VITALS — BP 105/73 | HR 94 | Temp 97.3°F | Resp 18 | Wt 188.4 lb

## 2017-05-03 DIAGNOSIS — C160 Malignant neoplasm of cardia: Secondary | ICD-10-CM

## 2017-05-03 DIAGNOSIS — C159 Malignant neoplasm of esophagus, unspecified: Secondary | ICD-10-CM

## 2017-05-03 DIAGNOSIS — Z510 Encounter for antineoplastic radiation therapy: Secondary | ICD-10-CM | POA: Diagnosis not present

## 2017-05-03 DIAGNOSIS — C155 Malignant neoplasm of lower third of esophagus: Secondary | ICD-10-CM | POA: Diagnosis not present

## 2017-05-03 DIAGNOSIS — Z5111 Encounter for antineoplastic chemotherapy: Secondary | ICD-10-CM | POA: Diagnosis not present

## 2017-05-03 DIAGNOSIS — R638 Other symptoms and signs concerning food and fluid intake: Secondary | ICD-10-CM

## 2017-05-03 MED ORDER — ONDANSETRON 8 MG PO TBDP
ORAL_TABLET | ORAL | Status: AC
  Filled 2017-05-03: qty 1

## 2017-05-03 MED ORDER — SODIUM CHLORIDE 0.9% FLUSH
10.0000 mL | INTRAVENOUS | Status: DC | PRN
  Administered 2017-05-03: 10 mL
  Filled 2017-05-03: qty 10

## 2017-05-03 MED ORDER — SODIUM CHLORIDE 0.9 % IV SOLN
Freq: Once | INTRAVENOUS | Status: AC
  Administered 2017-05-03: 11:00:00 via INTRAVENOUS

## 2017-05-03 MED ORDER — HEPARIN SOD (PORK) LOCK FLUSH 100 UNIT/ML IV SOLN
500.0000 [IU] | Freq: Once | INTRAVENOUS | Status: AC | PRN
  Administered 2017-05-03: 500 [IU]

## 2017-05-03 MED ORDER — ONDANSETRON 8 MG PO TBDP
8.0000 mg | ORAL_TABLET | Freq: Once | ORAL | Status: AC
Start: 2017-05-03 — End: 2017-05-03
  Administered 2017-05-03: 8 mg via ORAL

## 2017-05-03 MED ORDER — SODIUM CHLORIDE 0.9 % IV SOLN: Freq: Once | INTRAVENOUS | Status: DC

## 2017-05-03 NOTE — Progress Notes (Signed)
Ella Jubilee tolerated hydration and nausea medication well without complaints or incident. VSS upon discharge. Pt discharged self ambulatory in satisfactory condition accompanied by a friend

## 2017-05-03 NOTE — Patient Instructions (Signed)
.  [place] at [facility] Discharge Instructions  Received 3 hrs of hydration today. Follow-up as scheduled. Call clinic for any questions or concerns   Thank you for choosing [place] at [facility] to provide your oncology and hematology care.  To afford each patient quality time with our provider, please arrive at least 15 minutes before your scheduled appointment time.   If you have a lab appointment with the [facility] please come in thru the  Main Entrance and check in at the main information desk  You need to re-schedule your appointment should you arrive 10 or more minutes late.  We strive to give you quality time with our providers, and arriving late affects you and other patients whose appointments are after yours.  Also, if you no show three or more times for appointments you may be dismissed from the clinic at the providers discretion.     Again, thank you for choosing [facility].  Our hope is that these requests will decrease the amount of time that you wait before being seen by our physicians.       _____________________________________________________________  Should you have questions after your visit to [facility], please contact our office at [phone] between the hours of 8:30 a.m. and 4:30 p.m.  Voicemails left after 4:30 p.m. will not be returned until the following business day.  For prescription refill requests, have your pharmacy contact our office.       Resources For Cancer Patients and their Caregivers ? American Cancer Society: Can assist with transportation, wigs, general needs, runs Look Good Feel Better.        951-205-2947 ? Cancer Care: Provides financial assistance, online support groups, medication/co-pay assistance.  1-800-813-HOPE 567-323-0491) ? [place] Assists [place] cancer patients and their families through emotional , educational and  financial support.  864-218-9288 ? [place] DSS Where to apply for food stamps, Medicaid and utility assistance. (603)200-3710 ? RCATS: Transportation to medical appointments. (431) 168-3129 ? Social Security Administration: May apply for disability if have a Stage IV cancer. [phone] ? [employer], Disability and Transit Services: Assists with nutrition, care and transit needs. [place] Support Programs:   > Cancer Support Group  2nd Tuesday of the month 1pm-2pm, Journey Room   > Creative Journey  3rd Tuesday of the month 1130am-1pm, Journey Room

## 2017-05-04 ENCOUNTER — Ambulatory Visit
Admission: RE | Admit: 2017-05-04 | Discharge: 2017-05-04 | Disposition: A | Discharge status: Home / Self Care | Payer: Medicare Other | Source: Ambulatory Visit | Attending: Radiation Oncology | Admitting: Radiation Oncology

## 2017-05-04 VITALS — BP 102/69 | HR 93 | Temp 97.6°F | Resp 16

## 2017-05-04 DIAGNOSIS — C155 Malignant neoplasm of lower third of esophagus: Secondary | ICD-10-CM | POA: Diagnosis not present

## 2017-05-04 DIAGNOSIS — C160 Malignant neoplasm of cardia: Secondary | ICD-10-CM | POA: Diagnosis not present

## 2017-05-04 DIAGNOSIS — Z510 Encounter for antineoplastic radiation therapy: Secondary | ICD-10-CM | POA: Diagnosis not present

## 2017-05-04 MED ORDER — PROCHLORPERAZINE MALEATE 10 MG PO TABS: 10.0000 mg | ORAL_TABLET | Freq: Once | ORAL | 0 refills | Status: DC

## 2017-05-04 MED ORDER — PROCHLORPERAZINE MALEATE 10 MG PO TABS
10.0000 mg | ORAL_TABLET | Freq: Once | ORAL | Status: AC
  Administered 2017-05-04: 10 mg via ORAL
  Filled 2017-05-04: qty 1

## 2017-05-04 NOTE — Progress Notes (Signed)
[name] came around after his treatment complaining of nausea.  He states he has nausea medication at home but he didn't take it this AM.  VSS.  No other complaints voiced.  He was given 10 mg compazine.    BP 102/69 (BP Location: Right Arm)   Pulse 93   Temp 97.6 F (36.4 C) (Oral)   Resp 16   SpO2 96%    Cori Razor, RN

## 2017-05-05 ENCOUNTER — Inpatient Hospital Stay (HOSPITAL_COMMUNITY): Payer: Medicare Other

## 2017-05-05 ENCOUNTER — Encounter (HOSPITAL_COMMUNITY): Payer: Self-pay | Admitting: Hematology

## 2017-05-05 ENCOUNTER — Encounter (HOSPITAL_COMMUNITY): Payer: Self-pay

## 2017-05-05 ENCOUNTER — Other Ambulatory Visit: Payer: Self-pay

## 2017-05-05 ENCOUNTER — Ambulatory Visit
Admission: RE | Admit: 2017-05-05 | Discharge: 2017-05-05 | Disposition: A | Discharge status: Home / Self Care | Payer: Medicare Other | Source: Ambulatory Visit | Attending: Radiation Oncology | Admitting: Radiation Oncology

## 2017-05-05 ENCOUNTER — Inpatient Hospital Stay (HOSPITAL_BASED_OUTPATIENT_CLINIC_OR_DEPARTMENT_OTHER): Payer: Medicare Other | Admitting: Hematology

## 2017-05-05 DIAGNOSIS — Z5111 Encounter for antineoplastic chemotherapy: Secondary | ICD-10-CM | POA: Diagnosis not present

## 2017-05-05 DIAGNOSIS — I2510 Atherosclerotic heart disease of native coronary artery without angina pectoris: Secondary | ICD-10-CM

## 2017-05-05 DIAGNOSIS — I129 Hypertensive chronic kidney disease with stage 1 through stage 4 chronic kidney disease, or unspecified chronic kidney disease: Secondary | ICD-10-CM

## 2017-05-05 DIAGNOSIS — Z510 Encounter for antineoplastic radiation therapy: Secondary | ICD-10-CM | POA: Diagnosis not present

## 2017-05-05 DIAGNOSIS — K219 Gastro-esophageal reflux disease without esophagitis: Secondary | ICD-10-CM | POA: Diagnosis not present

## 2017-05-05 DIAGNOSIS — R011 Cardiac murmur, unspecified: Secondary | ICD-10-CM

## 2017-05-05 DIAGNOSIS — Z79899 Other long term (current) drug therapy: Secondary | ICD-10-CM

## 2017-05-05 DIAGNOSIS — I700 Atherosclerosis of aorta: Secondary | ICD-10-CM

## 2017-05-05 DIAGNOSIS — R1310 Dysphagia, unspecified: Secondary | ICD-10-CM | POA: Diagnosis not present

## 2017-05-05 DIAGNOSIS — Z8619 Personal history of other infectious and parasitic diseases: Secondary | ICD-10-CM

## 2017-05-05 DIAGNOSIS — Z86010 Personal history of colonic polyps: Secondary | ICD-10-CM

## 2017-05-05 DIAGNOSIS — C155 Malignant neoplasm of lower third of esophagus: Secondary | ICD-10-CM | POA: Diagnosis not present

## 2017-05-05 DIAGNOSIS — E119 Type 2 diabetes mellitus without complications: Secondary | ICD-10-CM

## 2017-05-05 DIAGNOSIS — C160 Malignant neoplasm of cardia: Secondary | ICD-10-CM

## 2017-05-05 DIAGNOSIS — N183 Chronic kidney disease, stage 3 (moderate): Secondary | ICD-10-CM

## 2017-05-05 DIAGNOSIS — C158 Malignant neoplasm of overlapping sites of esophagus: Secondary | ICD-10-CM

## 2017-05-05 DIAGNOSIS — R112 Nausea with vomiting, unspecified: Secondary | ICD-10-CM

## 2017-05-05 DIAGNOSIS — I252 Old myocardial infarction: Secondary | ICD-10-CM

## 2017-05-05 DIAGNOSIS — I5022 Chronic systolic (congestive) heart failure: Secondary | ICD-10-CM

## 2017-05-05 DIAGNOSIS — F250 Schizoaffective disorder, bipolar type: Secondary | ICD-10-CM

## 2017-05-05 DIAGNOSIS — G4733 Obstructive sleep apnea (adult) (pediatric): Secondary | ICD-10-CM

## 2017-05-05 DIAGNOSIS — K5900 Constipation, unspecified: Secondary | ICD-10-CM

## 2017-05-05 DIAGNOSIS — E7800 Pure hypercholesterolemia, unspecified: Secondary | ICD-10-CM

## 2017-05-05 DIAGNOSIS — Z87442 Personal history of urinary calculi: Secondary | ICD-10-CM

## 2017-05-05 LAB — COMPREHENSIVE METABOLIC PANEL
ALBUMIN: 3.2 g/dL — AB (ref 3.5–5.0)
ALK PHOS: 111 U/L (ref 38–126)
ALT: 12 U/L — AB (ref 17–63)
AST: 23 U/L (ref 15–41)
Anion gap: 10 (ref 5–15)
BILIRUBIN TOTAL: 0.8 mg/dL (ref 0.3–1.2)
BUN: 18 mg/dL (ref 6–20)
CALCIUM: 8.2 mg/dL — AB (ref 8.9–10.3)
CO2: 24 mmol/L (ref 22–32)
CREATININE: 1.13 mg/dL (ref 0.61–1.24)
Chloride: 97 mmol/L — ABNORMAL LOW (ref 101–111)
GFR calc Af Amer: >60 mL/min (ref >60)
GFR calc non Af Amer: >60 mL/min (ref >60)
GLUCOSE: 349 mg/dL — AB (ref 65–99)
Potassium: 4.2 mmol/L (ref 3.5–5.1)
SODIUM: 131 mmol/L — AB (ref 135–145)
TOTAL PROTEIN: 6.4 g/dL — AB (ref 6.5–8.1)

## 2017-05-05 LAB — CBC WITH DIFFERENTIAL/PLATELET
Basophils Absolute: 0 10*3/uL (ref 0.0–0.1)
Basophils Relative: 0 %
EOS ABS: 0.1 10*3/uL (ref 0.0–0.7)
Eosinophils Relative: 3 %
HEMATOCRIT: 41.9 % (ref 39.0–52.0)
HEMOGLOBIN: 13.9 g/dL (ref 13.0–17.0)
Lymphocytes Relative: 11 %
Lymphs Abs: 0.4 10*3/uL — ABNORMAL LOW (ref 0.7–4.0)
MCH: 28.5 pg (ref 26.0–34.0)
MCHC: 33.2 g/dL (ref 30.0–36.0)
MCV: 86 fL (ref 78.0–100.0)
MONOS PCT: 13 %
Monocytes Absolute: 0.4 10*3/uL (ref 0.1–1.0)
NEUTROS ABS: 2.4 10*3/uL (ref 1.7–7.7)
NEUTROS PCT: 73 %
Platelets: 74 10*3/uL — ABNORMAL LOW (ref 150–400)
RBC: 4.87 MIL/uL (ref 4.22–5.81)
RDW: 14 % (ref 11.5–15.5)
WBC: 3.3 10*3/uL — AB (ref 4.0–10.5)

## 2017-05-05 MED ORDER — HEPARIN SOD (PORK) LOCK FLUSH 100 UNIT/ML IV SOLN
INTRAVENOUS | Status: AC
  Filled 2017-05-05: qty 5

## 2017-05-05 MED ORDER — HEPARIN SOD (PORK) LOCK FLUSH 100 UNIT/ML IV SOLN
500.0000 [IU] | Freq: Once | INTRAVENOUS | Status: AC
  Administered 2017-05-05: 500 [IU] via INTRAVENOUS

## 2017-05-05 MED ORDER — SODIUM CHLORIDE 0.9% FLUSH
10.0000 mL | Freq: Once | INTRAVENOUS | Status: AC
  Administered 2017-05-05: 10 mL

## 2017-05-05 NOTE — Progress Notes (Signed)
[place] [address], [place] [address]   CLINIC:  Medical Oncology/Hematology  PCP:  Sinda Du, [place] [address] [phone]   REASON FOR VISIT:  Follow-up for GE junction adenocarcinoma.  CURRENT THERAPY: Carboplatin and paclitaxel, weekly   INTERVAL HISTORY:  Mr. [name] 67 y.o. male returns for routine follow-up and consideration for next cycle of chemotherapy.  He has finished 2 cycles of weekly carboplatin and paclitaxel.  He denies any tingling or numbness in extremities.  He had mild nausea which is controlled with Compazine.  He did have some constipation.  He continues to have trouble swallowing solid foods.  He is drinking glucerna with occasional boost.  Denies any fevers or infections.  His weight is more or less stable.      REVIEW OF SYSTEMS:  Review of Systems  Constitutional: Positive for fatigue.  HENT:  Negative.   Respiratory: Negative.   Cardiovascular: Negative.   Gastrointestinal: Positive for constipation and nausea.  Genitourinary: Negative.    Musculoskeletal: Negative.   Neurological: Positive for dizziness.  Psychiatric/Behavioral: Negative.      PAST MEDICAL/SURGICAL HISTORY:  Past Medical History:  Diagnosis Date  . Arthritis   . Bipolar disorder ([place])   . CAD (coronary artery disease) 08/16/2016  . Cellulitis and abscess of foot 03/05/2016  . Chronic combined systolic and diastolic heart failure ([place])   . Diabetes mellitus without complication ([place])    boderline  . GERD (gastroesophageal reflux disease)   . Heart murmur 1995  . History of kidney stones   . Hypercholesteremia   . Hypertension   . Kidney stones   . Myocardial infarction ([place])   . OSA (obstructive sleep apnea)    no cpap, can't tolerate  . Schizoaffective disorder, bipolar type ([place]) 04/25/2016   Past Surgical History:  Procedure Laterality Date  . APPENDECTOMY    . BIOPSY  03/09/2017   Procedure: BIOPSY;  Surgeon: Danie Binder, MD;  Location: [facility];  Service: Endoscopy;;  gastric esophageal  . CHOLECYSTECTOMY N/A 10/20/2013   Procedure: LAPAROSCOPIC CHOLECYSTECTOMY;  Surgeon: [name], MD;  Location: [facility] ORS;  Service: General;  Laterality: N/A;  . COLONOSCOPY WITH PROPOFOL N/A 03/09/2017   Procedure: COLONOSCOPY WITH PROPOFOL;  Surgeon: Danie Binder, MD;  Location: [facility];  Service: Endoscopy;  Laterality: N/A;  12:15pm  . ESOPHAGOGASTRODUODENOSCOPY (EGD) WITH PROPOFOL N/A 03/09/2017   Procedure: ESOPHAGOGASTRODUODENOSCOPY (EGD) WITH PROPOFOL;  Surgeon: Danie Binder, MD;  [facility];  Service: Endoscopy;  Laterality: N/A;  . EUS N/A 04/08/2017   Procedure: UPPER ENDOSCOPIC ULTRASOUND (EUS) RADIAL;  Surgeon: Milus Banister, MD;  Location: [facility];  Service: Endoscopy;  Laterality: N/A;  . HIP SURGERY Left   . KNEE SURGERY Left   . POLYPECTOMY  03/09/2017   Procedure: POLYPECTOMY;  Surgeon: Danie Binder, MD;  [facility];  Service: Endoscopy;;  colon   . PORTACATH PLACEMENT Left 04/07/2017   Procedure: INSERTION PORT-A-CATH LEFT SUBCLAVIAN;  Surgeon: Virl Cagey, MD;  Location: [facility] ORS;  Service: General;  Laterality: Left;  . SAVORY DILATION N/A 03/09/2017   Procedure: SAVORY DILATION;  Surgeon: Danie Binder, MD;  Location: [facility];  Service: Endoscopy;  Laterality: N/A;     SOCIAL HISTORY:  Social History   Socioeconomic History  . Marital status: Divorced    Spouse name: Not on file  . Number of children:  Not on file  . Years of education: Not on file  . Highest education level: Not on file  Social Needs  . Financial resource strain: Not on file  . Food insecurity - worry: Not on file  . Food insecurity - inability: Not on file  . Transportation needs - medical: Not on file  . Transportation needs - non-medical: Not on file  Occupational History  . Not on file  Tobacco Use  . Smoking  status: Never Smoker  . Smokeless tobacco: Never Used  Substance and Sexual Activity  . Alcohol use: No  . Drug use: No  . Sexual activity: Not Currently    Birth control/protection: Implant  Other Topics Concern  . Not on file  Social History Narrative  . Not on file    FAMILY HISTORY:  Family History  Problem Relation Age of Onset  . Hypertension Mother   . Dementia Mother   . Cancer Mother        breast  . Colon cancer Neg Hx   . Colon polyps Neg Hx     CURRENT MEDICATIONS:  Outpatient Encounter Medications as of 05/05/2017  Medication Sig  . acetaminophen (TYLENOL) 325 MG tablet Take 650 mg by mouth every 6 (six) hours as needed.  . AMITIZA 24 MCG capsule Take 24 mcg by mouth 2 (two) times a week.   Marland Kitchen aspirin EC 81 MG tablet Take 81 mg by mouth daily.  Marland Kitchen CARBOPLATIN IV Inject into the vein.  Marland Kitchen dexamethasone (DECADRON) 4 MG tablet Take 2 tablets (8 mg total) by mouth daily. Start the day after chemotherapy for 2 days.  Marland Kitchen dexlansoprazole (DEXILANT) 60 MG capsule Take 1 capsule (60 mg total) by mouth daily.  . furosemide (LASIX) 40 MG tablet Alternate 40 mg one day and 20 mg the next (Patient taking differently: Take 20-40 mg by mouth See admin instructions. Alternate 40 mg one day and 20 mg the next)  . lidocaine (XYLOCAINE) 2 % solution   . lidocaine-prilocaine (EMLA) cream Apply a quarter size amount to affected area 1 hour prior to coming to chemotherapy. Do not rub in. Cover with plastic wrap.  . linagliptin (TRADJENTA) 5 MG TABS tablet Take 5 mg by mouth daily.  . magic mouthwash w/lidocaine SOLN Take 5 mLs by mouth 4 (four) times daily as needed for mouth pain.  Marland Kitchen OLANZapine (ZYPREXA) 5 MG tablet Take 5 mg by mouth 2 (two) times daily.  . ondansetron (ZOFRAN ODT) 8 MG disintegrating tablet Take 1 tablet (8 mg total) by mouth every 8 (eight) hours as needed for nausea or vomiting.  Marland Kitchen oxyCODONE (ROXICODONE) 5 MG immediate release tablet Take 1 tablet (5 mg total) by mouth  every 4 (four) hours as needed.  Marland Kitchen PACLitaxel (TAXOL IV) Inject into the vein.  . potassium chloride SA (K-DUR,KLOR-CON) 20 MEQ tablet Take 20 mEq by mouth daily.  . prochlorperazine (COMPAZINE) 10 MG tablet Take 1 tablet (10 mg total) by mouth every 6 (six) hours as needed (Nausea or vomiting).  . sacubitril-valsartan (ENTRESTO) 49-51 MG Take 1 tablet by mouth 2 (two) times daily.  . prochlorperazine (COMPAZINE) 10 MG tablet Take 1 tablet (10 mg total) by mouth once for 1 dose.  . rosuvastatin (CRESTOR) 10 MG tablet Take 1 tablet (10 mg total) by mouth daily.   No facility-administered encounter medications on file as of 05/05/2017.     ALLERGIES:  Allergies  Allergen Reactions  . Codeine Nausea And Vomiting  . Morphine And  Related Nausea And Vomiting     PHYSICAL EXAM:  ECOG Performance status: 2 There were no vitals filed for this visit. There were no vitals filed for this visit.    LABORATORY DATA:  I have reviewed the labs as listed.  CBC    Component Value Date/Time   WBC 3.3 (L) 05/05/2017 1002   RBC 4.87 05/05/2017 1002   HGB 13.9 05/05/2017 1002   HCT 41.9 05/05/2017 1002   PLT 74 (L) 05/05/2017 1002   MCV 86.0 05/05/2017 1002   MCH 28.5 05/05/2017 1002   MCHC 33.2 05/05/2017 1002   RDW 14.0 05/05/2017 1002   LYMPHSABS 0.4 (L) 05/05/2017 1002   MONOABS 0.4 05/05/2017 1002   EOSABS 0.1 05/05/2017 1002   BASOSABS 0.0 05/05/2017 1002   CMP Latest Ref Rng & Units 05/05/2017 04/28/2017 04/21/2017  Glucose 65 - 99 mg/dL 349(H) 584(HH) 428(H)  BUN 6 - 20 mg/dL 18 24(H) 25(H)  Creatinine 0.61 - 1.24 mg/dL 1.13 1.24 1.17  Sodium 135 - 145 mmol/L 131(L) 130(L) 132(L)  Potassium 3.5 - 5.1 mmol/L 4.2 4.8 4.2  Chloride 101 - 111 mmol/L 97(L) 94(L) 96(L)  CO2 22 - 32 mmol/L 24 26 25   Calcium 8.9 - 10.3 mg/dL 8.2(L) 8.9 8.6(L)  Total Protein 6.5 - 8.1 g/dL 6.4(L) 6.5 6.6  Total Bilirubin 0.3 - 1.2 mg/dL 0.8 1.0 0.8  Alkaline Phos 38 - 126 U/L 111 125 114  AST 15 - 41 U/L  23 18 16   ALT 17 - 63 U/L 12(L) 13(L) 12(L)         ASSESSMENT & PLAN:  1.Distal esophageal adenocarcinoma: He started radiation therapy on 04/19/2017 and received first cycle of carboplatin and paclitaxel on 03/24/2017.  He is here for third cycle of carboplatin and paclitaxel.  Given though he has tolerated it reasonably well, his platelet count is low at 74 today.  His white count is 3.3 with absolute count of 2400.  I would hold off on today's treatment until platelet count improves 200.  We will reevaluate his blood counts next week.  2.  Diabetes: He is taking Tradjenta 5 mg daily.  His blood sugar is slightly better today 340.  During previous cycle I have cut back on dexamethasone to 10 mg in the premeds.  I have recommended evaluation by endocrinology for optimal control.  He is also drinking Glucerna.  3.  Nausea: He will continue Compazine every 6 hours as needed.  He will decrease the usage of Zofran as he is getting constipated.         @[facility] @ [facility] [phone]

## 2017-05-05 NOTE — Progress Notes (Signed)
No treatment today per Dr. Delton Coombes. Platelets were too low. Follow up as scheduled. Discharged from clinic ambulatory with caregiver.

## 2017-05-06 ENCOUNTER — Ambulatory Visit
Admission: RE | Admit: 2017-05-06 | Discharge: 2017-05-06 | Disposition: A | Discharge status: Home / Self Care | Payer: Medicare Other | Source: Ambulatory Visit | Attending: Radiation Oncology | Admitting: Radiation Oncology

## 2017-05-06 DIAGNOSIS — C160 Malignant neoplasm of cardia: Secondary | ICD-10-CM | POA: Diagnosis not present

## 2017-05-06 DIAGNOSIS — C155 Malignant neoplasm of lower third of esophagus: Secondary | ICD-10-CM | POA: Diagnosis not present

## 2017-05-06 DIAGNOSIS — Z510 Encounter for antineoplastic radiation therapy: Secondary | ICD-10-CM | POA: Diagnosis not present

## 2017-05-07 ENCOUNTER — Inpatient Hospital Stay (HOSPITAL_COMMUNITY): Payer: Medicare Other

## 2017-05-07 ENCOUNTER — Encounter (HOSPITAL_COMMUNITY): Payer: Self-pay

## 2017-05-07 ENCOUNTER — Ambulatory Visit
Admission: RE | Admit: 2017-05-07 | Discharge: 2017-05-07 | Disposition: A | Discharge status: Home / Self Care | Payer: Medicare Other | Source: Ambulatory Visit | Attending: Radiation Oncology | Admitting: Radiation Oncology

## 2017-05-07 VITALS — BP 109/68 | HR 86 | Temp 97.6°F | Resp 18 | Wt 186.0 lb

## 2017-05-07 DIAGNOSIS — C160 Malignant neoplasm of cardia: Secondary | ICD-10-CM | POA: Diagnosis not present

## 2017-05-07 DIAGNOSIS — R638 Other symptoms and signs concerning food and fluid intake: Secondary | ICD-10-CM

## 2017-05-07 DIAGNOSIS — Z510 Encounter for antineoplastic radiation therapy: Secondary | ICD-10-CM | POA: Diagnosis not present

## 2017-05-07 DIAGNOSIS — Z5111 Encounter for antineoplastic chemotherapy: Secondary | ICD-10-CM | POA: Diagnosis not present

## 2017-05-07 DIAGNOSIS — C155 Malignant neoplasm of lower third of esophagus: Secondary | ICD-10-CM | POA: Diagnosis not present

## 2017-05-07 DIAGNOSIS — C159 Malignant neoplasm of esophagus, unspecified: Secondary | ICD-10-CM

## 2017-05-07 MED ORDER — SODIUM CHLORIDE 0.9 % IV SOLN
Freq: Once | INTRAVENOUS | Status: AC
  Administered 2017-05-07: 10:00:00 via INTRAVENOUS

## 2017-05-07 MED ORDER — HEPARIN SOD (PORK) LOCK FLUSH 100 UNIT/ML IV SOLN
500.0000 [IU] | Freq: Once | INTRAVENOUS | Status: AC | PRN
  Administered 2017-05-07: 500 [IU]

## 2017-05-07 MED ORDER — SODIUM CHLORIDE 0.9% FLUSH
10.0000 mL | INTRAVENOUS | Status: DC | PRN
  Administered 2017-05-07: 10 mL
  Filled 2017-05-07: qty 10

## 2017-05-07 NOTE — Patient Instructions (Signed)
[name] at [facility] Discharge Instructions  Received IV hydration today. Follow-up as scheduled. Call clinic for any questions or concerns   Thank you for choosing [place] at [facility] to provide your oncology and hematology care.  To afford each patient quality time with our provider, please arrive at least 15 minutes before your scheduled appointment time.   If you have a lab appointment with the [facility] please come in thru the  Main Entrance and check in at the main information desk  You need to re-schedule your appointment should you arrive 10 or more minutes late.  We strive to give you quality time with our providers, and arriving late affects you and other patients whose appointments are after yours.  Also, if you no show three or more times for appointments you may be dismissed from the clinic at the providers discretion.     Again, thank you for choosing [facility].  Our hope is that these requests will decrease the amount of time that you wait before being seen by our physicians.       _____________________________________________________________  Should you have questions after your visit to [facility], please contact our office at [phone] between the hours of 8:30 a.m. and 4:30 p.m.  Voicemails left after 4:30 p.m. will not be returned until the following business day.  For prescription refill requests, have your pharmacy contact our office.       Resources For Cancer Patients and their Caregivers ? American Cancer Society: Can assist with transportation, wigs, general needs, runs Look Good Feel Better.        ([phone] ? Cancer Care: Provides financial assistance, online support groups, medication/co-pay assistance.  1-800-813-HOPE 213-887-3584) ? [place] Assists [place] cancer patients and their families through emotional , educational and  financial support.  682-750-7503 ? [place] DSS Where to apply for food stamps, Medicaid and utility assistance. 570-465-3259 ? RCATS: Transportation to medical appointments. 507-545-1091 ? Social Security Administration: May apply for disability if have a Stage IV cancer. (832) 042-6726 (986) [phone] ? [employer], Disability and Transit Services: Assists with nutrition, care and transit needs. [place] Support Programs:   > Cancer Support Group  2nd Tuesday of the month 1pm-2pm, Journey Room   > Creative Journey  3rd Tuesday of the month 1130am-1pm, Journey Room

## 2017-05-07 NOTE — Progress Notes (Signed)
Ella Jubilee tolerated hydration well without complaints or incident. VSS upon discharge. Pt discharged via wheelchair in satisfactory condition accompanied by a friend

## 2017-05-10 ENCOUNTER — Ambulatory Visit
Admission: RE | Admit: 2017-05-10 | Discharge: 2017-05-10 | Disposition: A | Discharge status: Home / Self Care | Payer: Medicare Other | Source: Ambulatory Visit | Attending: Radiation Oncology | Admitting: Radiation Oncology

## 2017-05-10 ENCOUNTER — Inpatient Hospital Stay (HOSPITAL_COMMUNITY): Payer: Medicare Other

## 2017-05-10 VITALS — BP 109/70 | HR 73 | Temp 97.5°F | Resp 18 | Wt 186.6 lb

## 2017-05-10 DIAGNOSIS — C160 Malignant neoplasm of cardia: Secondary | ICD-10-CM

## 2017-05-10 DIAGNOSIS — C159 Malignant neoplasm of esophagus, unspecified: Secondary | ICD-10-CM

## 2017-05-10 DIAGNOSIS — R638 Other symptoms and signs concerning food and fluid intake: Secondary | ICD-10-CM

## 2017-05-10 DIAGNOSIS — C155 Malignant neoplasm of lower third of esophagus: Secondary | ICD-10-CM | POA: Diagnosis not present

## 2017-05-10 DIAGNOSIS — Z5111 Encounter for antineoplastic chemotherapy: Secondary | ICD-10-CM | POA: Diagnosis not present

## 2017-05-10 DIAGNOSIS — Z510 Encounter for antineoplastic radiation therapy: Secondary | ICD-10-CM | POA: Diagnosis not present

## 2017-05-10 MED ORDER — SODIUM CHLORIDE 0.9% FLUSH
10.0000 mL | INTRAVENOUS | Status: DC | PRN
  Administered 2017-05-10: 10 mL
  Filled 2017-05-10: qty 10

## 2017-05-10 MED ORDER — HEPARIN SOD (PORK) LOCK FLUSH 100 UNIT/ML IV SOLN
500.0000 [IU] | Freq: Once | INTRAVENOUS | Status: AC | PRN
  Administered 2017-05-10: 500 [IU]
  Filled 2017-05-10: qty 5

## 2017-05-10 MED ORDER — SODIUM CHLORIDE 0.9 % IV SOLN
Freq: Once | INTRAVENOUS | Status: AC
  Administered 2017-05-10: 12:00:00 via INTRAVENOUS

## 2017-05-10 NOTE — Progress Notes (Signed)
Diagnosis Malignant neoplasm of overlapping sites of esophagus ([place]) - Plan: CBC with Differential/Platelet, Comprehensive metabolic panel, Lactate dehydrogenase, CBC with Differential, Comprehensive metabolic panel, prochlorperazine (COMPAZINE) 10 MG tablet, dexamethasone (DECADRON) 4 MG tablet, DISCONTINUED: lidocaine-prilocaine (EMLA) cream  Staging Cancer Staging No matching staging information was found for the patient.  Assessment and Plan:  1.  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 - diagnosed 03/06/2017.  Pt was initially seen by Dr. Oretha Milch.  Path reports from EGD and colonoscopy. 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 Negative for Helicobacter pylori stomach. CT chest abdomen pelvis with contrast 03/12/2017 showed:  IMPRESSION: 1. Large intraluminal mass in the distal esophagus consistent with adenocarcinoma. No esophageal obstruction, mediastinal extension of tumor or definite metastatic disease. No definite extension of tumor in the stomach visualized. 2. Small upper abdominal lymph nodes, nonspecific. No enlarged lymph nodes identified. 3. Chronic left renal cortical thinning. 4.  Aortic Atherosclerosis (ICD10-I70.0). has been reviewed.    Pt had PET scan done 03/24/2017 that showed: IMPRESSION: 1. Mass involving the distal esophagus measures approximately 7.6 cm in length and exhibits intense FDG uptake compatible with primary esophageal neoplasm. 2. No evidence for solid organ metastasis and no hypermetabolic lymph nodes identified. 3. Aortic Atherosclerosis (ICD10-I70.0). Lad coronary artery calcifications noted.  Pt underwent EUS on 04/08/2017 with findings of :  7cm long, bulky, focally circumferential (distally), uT3N0M0 (Stage II) GE  junction adenocarcinoma with nearby satellite lesion 2cm distal to the GE junction. - He will likely benefit from neo-adjuvant chemo/XRT.  He has been seen by Dr. Lisbeth Renshaw of [facility].  He is recommended for neoadjuvant chemotherapy with RT.  I have discussed with the pt Standard regimen of Carboplatin AUC 2 with Taxol 80 mg/m2 weekly for 6 weeks.   He will be evaluated for surgery by Dr. Servando Snare.    Pt seen for evaluation prior to C1 of Taxol and Carboplatin.  Labs adequate for chemotherapy.  He will be followed closely as therapy proceeds.  Cr 1.17.    2.  Bipolar disorder.  Pt on disability due to the last episode of hospitalization for this disorder was a year ago.Dr. Luan Pulling is his primary care physician who has been managing his bipolar disorder as well.    3. CAD.  Pt is followed by Cardiology.   4.  DM.   Continue to follow-up with PCP.    Interval History:  67 y.o. male who seen initially by Dr. Sherrine Maples after he was referred due to presenting  to his primary care physician with progressive dysphagia.  An EGD dated 02/27/2017 showed a medium-sized fungating mass with bleeding in the lower third of the esophagus and at the GE junction 30-35 cm from the incisors the mass was partially obstructing and circumferential Additional medium sized polypoid non-circumferential mass with no bleeding was found in the gastric fundus. Diffuse inflammation with edema and erythema antrum. Multiple small sessile polyps with no bleeding was found in the gastric body  Colonoscopy 03/09/2017 showed 10 mm polyp in the cecum to polyps in the proximal transverse colon and 3 additional polyps in the splenic flexure and transverse colon repeat colonoscopy in 3 years recommended a clip was placed in the colon.  Biopsies one lesion in the stomach cardia showed adenocarcinoma, although the polyps were benign. Chronic gastritis was noted.  Current  Status:  Pt is seen today for follow-up prior to chemotherapy.      Problem List Patient Active Problem List   Diagnosis Date Noted  . Decreased oral intake [R63.8] 04/22/2017  . Malignant neoplasm of esophagus (HCC) [C15.9]   . Malignant neoplasm of cardio-esophageal junction ([place]) [C16.0] 03/30/2017  . Encounter for screening colonoscopy [Z12.11] 01/29/2017  . Dysphagia [R13.10] 01/29/2017  . GERD (gastroesophageal reflux disease) [K21.9] 01/29/2017  . Acute renal failure superimposed on stage 3 chronic kidney disease ([place]) [N17.9, N18.3] 08/18/2016  . CHF (congestive heart failure) ([place]) [I50.9] 08/16/2016  . Acute on chronic combined systolic and diastolic CHF (congestive heart failure) ([place]) [I50.43] 08/16/2016  . OSA (obstructive sleep apnea) [G47.33] 08/16/2016  . Acute renal insufficiency [N28.9] 08/16/2016  . Acute on chronic combined systolic (congestive) and diastolic (congestive) heart failure ([place]) [I50.43] 08/16/2016  . CAD (coronary artery disease) [I25.10] 08/16/2016  . Schizoaffective disorder, bipolar type ([place]) [F25.0] 04/25/2016  . Acute on chronic diastolic heart failure ([place]) [I50.33] 03/07/2016  . Cellulitis and abscess of foot [L03.119, L02.619] 03/05/2016  . Diabetes mellitus with complication, without long-term current use of insulin ([place]) [E11.8] 03/05/2016  . Essential hypertension [I10] 03/05/2016  . Involuntary commitment [Z04.6] 03/05/2016  . Cellulitis [L03.90] 03/05/2016    Past Medical History Past Medical History:  Diagnosis Date  . Arthritis   . Bipolar disorder ([place])   . CAD (coronary artery disease) 08/16/2016  . Cellulitis and abscess of foot 03/05/2016  . Chronic combined systolic and diastolic heart failure ([place])   . Diabetes mellitus without complication ([place])    boderline  . GERD (gastroesophageal reflux disease)   . Heart murmur 1995  . History of kidney stones   . Hypercholesteremia   . Hypertension   . Kidney stones   . Myocardial infarction ([place])   . OSA (obstructive sleep apnea)    no cpap,  can't tolerate  . Schizoaffective disorder, bipolar type ([place]) 04/25/2016    Past Surgical History Past Surgical History:  Procedure Laterality Date  . APPENDECTOMY    . BIOPSY  03/09/2017   Procedure: BIOPSY;  Surgeon: Danie Binder, MD;  [facility];  Service: Endoscopy;;  gastric esophageal  . CHOLECYSTECTOMY N/A 10/20/2013   Procedure: LAPAROSCOPIC CHOLECYSTECTOMY;  Surgeon: [name], MD;  Location: [facility] ORS;  Service: General;  Laterality: N/A;  . COLONOSCOPY WITH PROPOFOL N/A 03/09/2017   Procedure: COLONOSCOPY WITH PROPOFOL;  Surgeon: Danie Binder, MD;  Location[facility];  Service: Endoscopy;  Laterality: N/A;  12:15pm  . ESOPHAGOGASTRODUODENOSCOPY (EGD) WITH PROPOFOL N/A 03/09/2017   Procedure: ESOPHAGOGASTRODUODENOSCOPY (EGD) WITH PROPOFOL;  Surgeon: Danie Binder, MD;  [facility];  Service: Endoscopy;  Laterality: N/A;  . EUS N/A 04/08/2017   Procedure: UPPER ENDOSCOPIC ULTRASOUND (EUS) RADIAL;  Surgeon: Milus Banister, MD;  Location: [facility];  Service: Endoscopy;  Laterality: N/A;  . HIP SURGERY Left   . KNEE SURGERY Left   . POLYPECTOMY  03/09/2017   Procedure: POLYPECTOMY;  Surgeon: Danie Binder, MD;  [facility];  Service: Endoscopy;;  colon   . PORTACATH PLACEMENT Left 04/07/2017   Procedure: INSERTION PORT-A-CATH LEFT SUBCLAVIAN;  Surgeon: Virl Cagey, MD;  Location: [facility];  Service: General;  Laterality: Left;  . SAVORY DILATION N/A 03/09/2017   Procedure: SAVORY DILATION;  Surgeon: Danie Binder, MD;  [facility];  Service: Endoscopy;  Laterality: N/A;    Family History Family History  Problem Relation Age of Onset  . Hypertension Mother   . Dementia Mother   . Cancer Mother        breast  . Colon cancer Neg Hx   . Colon polyps Neg Hx      Social History  reports that he has never smoked. He has never used smokeless tobacco. He reports that he does not drink alcohol or use  drugs.  Medications  Current Outpatient Medications:  .  acetaminophen (TYLENOL) 325 MG tablet, Take 650 mg by mouth every 6 (six) hours as needed., Disp: , Rfl:  .  AMITIZA 24 MCG capsule, Take 24 mcg by mouth 2 (two) times a week. , Disp: , Rfl:  .  aspirin EC 81 MG tablet, Take 81 mg by mouth daily., Disp: , Rfl:  .  CARBOPLATIN IV, Inject into the vein., Disp: , Rfl:  .  dexlansoprazole (DEXILANT) 60 MG capsule, Take 1 capsule (60 mg total) by mouth daily., Disp: 90 capsule, Rfl: 3 .  furosemide (LASIX) 40 MG tablet, Alternate 40 mg one day and 20 mg the next (Patient taking differently: Take 20-40 mg by mouth See admin instructions. Alternate 40 mg one day and 20 mg the next), Disp: 90 tablet, Rfl: 3 .  lidocaine-prilocaine (EMLA) cream, Apply a quarter size amount to affected area 1 hour prior to coming to chemotherapy. Do not rub in. Cover with plastic wrap., Disp: 30 g, Rfl: 2 .  linagliptin (TRADJENTA) 5 MG TABS tablet, Take 5 mg by mouth daily., Disp: , Rfl:  .  OLANZapine (ZYPREXA) 5 MG tablet, Take 5 mg by mouth 2 (two) times daily., Disp: , Rfl:  .  oxyCODONE (ROXICODONE) 5 MG immediate release tablet, Take 1 tablet (5 mg total) by mouth every 4 (four) hours as needed., Disp: 10 tablet, Rfl: 0 .  PACLitaxel (TAXOL IV), Inject into the vein., Disp: , Rfl:  .  potassium chloride SA (K-DUR,KLOR-CON) 20 MEQ tablet, Take 20 mEq by mouth daily., Disp: , Rfl:  .  sacubitril-valsartan (ENTRESTO) 49-51 MG, Take 1 tablet by mouth 2 (two) times daily., Disp: 60 tablet, Rfl: 6 .  dexamethasone (DECADRON) 4 MG tablet, Take 2 tablets (8 mg total) by mouth daily. Start the day after chemotherapy for 2 days., Disp: 30 tablet, Rfl: 1 .  lidocaine (XYLOCAINE) 2 % solution, , Disp: , Rfl:  .  magic mouthwash w/lidocaine SOLN, Take 5 mLs by mouth 4 (four) times daily as needed for mouth pain., Disp: 600 mL, Rfl: 1 .  ondansetron (ZOFRAN ODT) 8 MG disintegrating tablet, Take 1 tablet (8 mg total) by  mouth every 8 (eight) hours as needed for nausea or vomiting., Disp: 40 tablet, Rfl: 2 .  prochlorperazine (COMPAZINE) 10 MG tablet, Take 1 tablet (10 mg total) by mouth every 6 (six) hours as needed (Nausea or vomiting)., Disp: 30 tablet, Rfl: 1 .  prochlorperazine (COMPAZINE) 10 MG tablet, Take 1 tablet (10 mg total) by mouth once for 1 dose., Disp: 30 tablet, Rfl: 0 .  rosuvastatin (CRESTOR) 10 MG tablet, Take 1 tablet (10 mg total) by mouth daily., Disp: 90 tablet, Rfl: 3 No current facility-administered medications for this visit.   Facility-Administered Medications Ordered in Other Visits:  .  heparin lock flush 100 unit/mL, 500 Units, Intracatheter, Once PRN, Holley Bouche, NP .  sodium chloride flush (NS) 0.9 % injection 10 mL, 10 mL, Intracatheter, PRN, Holley Bouche, NP, 10 mL at 05/10/17 1228  Allergies Codeine and Morphine  and related  Review of Systems Review of Systems - Oncology ROS as per HPI otherwise 12 point ROS is negative.   Physical Exam  Vitals Wt Readings from Last 3 Encounters:  05/10/17 186 lb 9.6 oz (84.6 kg)  05/07/17 186 lb (84.4 kg)  05/05/17 187 lb 12.8 oz (85.2 kg)   Temp Readings from Last 3 Encounters:  05/10/17 (!) 97.5 F (36.4 C) (Oral)  05/07/17 97.6 F (36.4 C) (Oral)  05/05/17 (!) 97.3 F (36.3 C) (Oral)   BP Readings from Last 3 Encounters:  05/10/17 103/68  05/07/17 109/68  05/05/17 100/65   Pulse Readings from Last 3 Encounters:  05/10/17 91  05/07/17 86  05/05/17 86   Constitutional: Well-developed, well-nourished, and in no distress.   HENT: Head: Normocephalic and atraumatic.  Mouth/Throat: No oropharyngeal exudate. Mucosa moist. Eyes: Pupils are equal, round, and reactive to light. Conjunctivae are normal. No scleral icterus.  Neck: Normal range of motion. Neck supple. No JVD present.  Cardiovascular: Normal rate, regular rhythm and normal heart sounds.  Exam reveals no gallop and no friction rub.   No murmur  heard. Pulmonary/Chest: Effort normal and breath sounds normal. No respiratory distress. No wheezes.No rales.  Abdominal: Soft. Bowel sounds are normal. No distension. There is no tenderness. There is no guarding.  Musculoskeletal: No edema or tenderness.  Lymphadenopathy: No cervical, axillary or supraclavicular adenopathy.  Neurological: Alert and oriented to person, place, and time. No cranial nerve deficit.  Skin: Skin is warm and dry. No rash noted. No erythema. No pallor.  Psychiatric: Affect and judgment normal.   Labs No visits with results within 3 Day(s) from this visit.  Latest known visit with results is:  Admission on 04/08/2017, Discharged on 04/08/2017  Component Date Value Ref Range Status  . Glucose-Capillary 04/08/2017 186* 65 - 99 mg/dL Final     Pathology Orders Placed This Encounter  Procedures  . CBC with Differential/Platelet    Standing Status:   Future    Number of Occurrences:   1    Standing Expiration Date:   04/20/2018  . Comprehensive metabolic panel    Standing Status:   Future    Number of Occurrences:   1    Standing Expiration Date:   04/20/2018  . Lactate dehydrogenase    Standing Status:   Future    Number of Occurrences:   1    Standing Expiration Date:   04/20/2018  . CBC with Differential    Standing Status:   Standing    Number of Occurrences:   20    Standing Expiration Date:   04/20/2018  . Comprehensive metabolic panel    Standing Status:   Standing    Number of Occurrences:   20    Standing Expiration Date:   04/20/2018       Zoila Shutter MD

## 2017-05-11 ENCOUNTER — Other Ambulatory Visit: Payer: Self-pay

## 2017-05-11 ENCOUNTER — Encounter (HOSPITAL_COMMUNITY): Payer: Self-pay

## 2017-05-11 ENCOUNTER — Ambulatory Visit
Admission: RE | Admit: 2017-05-11 | Discharge: 2017-05-11 | Disposition: A | Discharge status: Home / Self Care | Payer: Medicare Other | Source: Ambulatory Visit | Attending: Radiation Oncology | Admitting: Radiation Oncology

## 2017-05-11 DIAGNOSIS — C160 Malignant neoplasm of cardia: Secondary | ICD-10-CM | POA: Diagnosis not present

## 2017-05-11 DIAGNOSIS — C155 Malignant neoplasm of lower third of esophagus: Secondary | ICD-10-CM | POA: Diagnosis not present

## 2017-05-11 DIAGNOSIS — Z510 Encounter for antineoplastic radiation therapy: Secondary | ICD-10-CM | POA: Diagnosis not present

## 2017-05-11 NOTE — Patient Instructions (Signed)
[place] at [facility] Discharge Instructions  Hydration fluids given today. Follow up as scheduled.   Thank you for choosing [place] at [facility] to provide your oncology and hematology care.  To afford each patient quality time with our provider, please arrive at least 15 minutes before your scheduled appointment time.   If you have a lab appointment with the [facility] please come in thru the  Main Entrance and check in at the main information desk  You need to re-schedule your appointment should you arrive 10 or more minutes late.  We strive to give you quality time with our providers, and arriving late affects you and other patients whose appointments are after yours.  Also, if you no show three or more times for appointments you may be dismissed from the clinic at the providers discretion.     Again, thank you for choosing [facility].  Our hope is that these requests will decrease the amount of time that you wait before being seen by our physicians.       _____________________________________________________________  Should you have questions after your visit to [facility], please contact our office at [phone] between the hours of 8:30 a.m. and 4:30 p.m.  Voicemails left after 4:30 p.m. will not be returned until the following business day.  For prescription refill requests, have your pharmacy contact our office.       Resources For Cancer Patients and their Caregivers ? American Cancer Society: Can assist with transportation, wigs, general needs, runs Look Good Feel Better.        307 223 6780 ? Cancer Care: Provides financial assistance, online support groups, medication/co-pay assistance.  1-800-813-HOPE 854-604-7930) ? [place] Assists [place] cancer patients and their families through emotional , educational and financial support.  (940)681-7407 ? [place] DSS Where to apply for food stamps, Medicaid and utility assistance. 704 558 5307 ? [facility]: Transportation to medical appointments. (936)100-7606 ? Social Security Administration: May apply for disability if have a Stage IV cancer. 561-077-7139 715-463-3995 ? [employer], Disability and Transit Services: Assists with nutrition, care and transit needs. [place] Support Programs:   > Cancer Support Group  2nd Tuesday of the month 1pm-2pm, Journey Room   > Creative Journey  3rd Tuesday of the month 1130am-1pm, Journey Room

## 2017-05-11 NOTE — Progress Notes (Signed)
Hydration fluids given today per orders. Patient tolerated it well without problems. Vitals stable and discharged home from clinic ambulatory. Follow up as scheduled.

## 2017-05-12 ENCOUNTER — Encounter (HOSPITAL_COMMUNITY): Payer: Self-pay

## 2017-05-12 ENCOUNTER — Inpatient Hospital Stay (HOSPITAL_COMMUNITY): Payer: Medicare Other

## 2017-05-12 ENCOUNTER — Other Ambulatory Visit: Payer: Self-pay

## 2017-05-12 ENCOUNTER — Ambulatory Visit
Admission: RE | Admit: 2017-05-12 | Discharge: 2017-05-12 | Disposition: A | Discharge status: Home / Self Care | Payer: Medicare Other | Source: Ambulatory Visit | Attending: Radiation Oncology | Admitting: Radiation Oncology

## 2017-05-12 ENCOUNTER — Ambulatory Visit (HOSPITAL_COMMUNITY): Payer: Self-pay

## 2017-05-12 DIAGNOSIS — C160 Malignant neoplasm of cardia: Secondary | ICD-10-CM | POA: Diagnosis not present

## 2017-05-12 DIAGNOSIS — C155 Malignant neoplasm of lower third of esophagus: Secondary | ICD-10-CM | POA: Diagnosis not present

## 2017-05-12 DIAGNOSIS — C158 Malignant neoplasm of overlapping sites of esophagus: Secondary | ICD-10-CM

## 2017-05-12 DIAGNOSIS — Z5111 Encounter for antineoplastic chemotherapy: Secondary | ICD-10-CM | POA: Diagnosis not present

## 2017-05-12 DIAGNOSIS — Z510 Encounter for antineoplastic radiation therapy: Secondary | ICD-10-CM | POA: Diagnosis not present

## 2017-05-12 LAB — COMPREHENSIVE METABOLIC PANEL
ALT: 12 U/L — AB (ref 17–63)
AST: 15 U/L (ref 15–41)
Albumin: 3.3 g/dL — ABNORMAL LOW (ref 3.5–5.0)
Alkaline Phosphatase: 101 U/L (ref 38–126)
Anion gap: 10 (ref 5–15)
BUN: 12 mg/dL (ref 6–20)
CHLORIDE: 97 mmol/L — AB (ref 101–111)
CO2: 26 mmol/L (ref 22–32)
CREATININE: 1.03 mg/dL (ref 0.61–1.24)
Calcium: 8.7 mg/dL — ABNORMAL LOW (ref 8.9–10.3)
GFR calc Af Amer: >60 mL/min (ref >60)
GFR calc non Af Amer: >60 mL/min (ref >60)
Glucose, Bld: 349 mg/dL — ABNORMAL HIGH (ref 65–99)
POTASSIUM: 3.9 mmol/L (ref 3.5–5.1)
SODIUM: 133 mmol/L — AB (ref 135–145)
Total Bilirubin: 0.8 mg/dL (ref 0.3–1.2)
Total Protein: 6.7 g/dL (ref 6.5–8.1)

## 2017-05-12 LAB — CBC WITH DIFFERENTIAL/PLATELET
Basophils Absolute: 0 10*3/uL (ref 0.0–0.1)
Basophils Relative: 1 %
Eosinophils Absolute: 0.1 10*3/uL (ref 0.0–0.7)
Eosinophils Relative: 4 %
HEMATOCRIT: 42.8 % (ref 39.0–52.0)
HEMOGLOBIN: 14.4 g/dL (ref 13.0–17.0)
LYMPHS ABS: 0.7 10*3/uL (ref 0.7–4.0)
LYMPHS PCT: 18 %
MCH: 28.7 pg (ref 26.0–34.0)
MCHC: 33.6 g/dL (ref 30.0–36.0)
MCV: 85.3 fL (ref 78.0–100.0)
MONOS PCT: 7 %
Monocytes Absolute: 0.3 10*3/uL (ref 0.1–1.0)
NEUTROS ABS: 2.8 10*3/uL (ref 1.7–7.7)
NEUTROS PCT: 70 %
Platelets: 70 10*3/uL — ABNORMAL LOW (ref 150–400)
RBC: 5.02 MIL/uL (ref 4.22–5.81)
RDW: 15 % (ref 11.5–15.5)
WBC: 4 10*3/uL (ref 4.0–10.5)

## 2017-05-12 NOTE — Progress Notes (Signed)
Labs reviewed with Dr. Delton Coombes - will hold tx today per MD d/t thrombocytopenia. Pt will return next week for possible tx as scheduled.  Discharged ambulatory in c/o family.

## 2017-05-13 ENCOUNTER — Ambulatory Visit
Admission: RE | Admit: 2017-05-13 | Discharge: 2017-05-13 | Disposition: A | Discharge status: Home / Self Care | Payer: Medicare Other | Source: Ambulatory Visit | Attending: Radiation Oncology | Admitting: Radiation Oncology

## 2017-05-13 DIAGNOSIS — C160 Malignant neoplasm of cardia: Secondary | ICD-10-CM | POA: Diagnosis not present

## 2017-05-13 DIAGNOSIS — Z510 Encounter for antineoplastic radiation therapy: Secondary | ICD-10-CM | POA: Diagnosis not present

## 2017-05-13 DIAGNOSIS — C155 Malignant neoplasm of lower third of esophagus: Secondary | ICD-10-CM | POA: Diagnosis not present

## 2017-05-14 ENCOUNTER — Inpatient Hospital Stay (HOSPITAL_COMMUNITY): Payer: Medicare Other

## 2017-05-14 ENCOUNTER — Encounter (HOSPITAL_COMMUNITY): Payer: Self-pay

## 2017-05-14 ENCOUNTER — Ambulatory Visit
Admission: RE | Admit: 2017-05-14 | Discharge: 2017-05-14 | Disposition: A | Discharge status: Home / Self Care | Payer: Medicare Other | Source: Ambulatory Visit | Attending: Radiation Oncology | Admitting: Radiation Oncology

## 2017-05-14 VITALS — BP 134/68 | HR 90 | Temp 97.5°F | Resp 18 | Wt 185.6 lb

## 2017-05-14 DIAGNOSIS — Z5111 Encounter for antineoplastic chemotherapy: Secondary | ICD-10-CM | POA: Diagnosis not present

## 2017-05-14 DIAGNOSIS — C159 Malignant neoplasm of esophagus, unspecified: Secondary | ICD-10-CM

## 2017-05-14 DIAGNOSIS — Z510 Encounter for antineoplastic radiation therapy: Secondary | ICD-10-CM | POA: Diagnosis not present

## 2017-05-14 DIAGNOSIS — C160 Malignant neoplasm of cardia: Secondary | ICD-10-CM | POA: Diagnosis not present

## 2017-05-14 DIAGNOSIS — R638 Other symptoms and signs concerning food and fluid intake: Secondary | ICD-10-CM

## 2017-05-14 DIAGNOSIS — C155 Malignant neoplasm of lower third of esophagus: Secondary | ICD-10-CM | POA: Diagnosis not present

## 2017-05-14 MED ORDER — SODIUM CHLORIDE 0.9 % IV SOLN
Freq: Once | INTRAVENOUS | Status: AC
  Administered 2017-05-14: 1000 mL via INTRAVENOUS

## 2017-05-14 MED ORDER — SODIUM CHLORIDE 0.9% FLUSH
10.0000 mL | INTRAVENOUS | Status: DC | PRN
  Administered 2017-05-14: 10 mL
  Filled 2017-05-14: qty 10

## 2017-05-14 MED ORDER — HEPARIN SOD (PORK) LOCK FLUSH 100 UNIT/ML IV SOLN
500.0000 [IU] | Freq: Once | INTRAVENOUS | Status: AC | PRN
  Administered 2017-05-14: 500 [IU]

## 2017-05-14 MED ORDER — SODIUM CHLORIDE 0.9 % IV SOLN
Freq: Once | INTRAVENOUS | Status: AC
  Administered 2017-05-14: 11:00:00 via INTRAVENOUS
  Filled 2017-05-14: qty 4

## 2017-05-14 NOTE — Progress Notes (Signed)
Nutrition Follow-up:  Patient with esophageal cancer extending to fundus.  Patient receiving chemotherapy and radiation therapy.    Met with patient during IV fluid infusion today.  Patient's chemotherapy held due to low platelets.  Patient reports usually always eats good breakfast everyday (1 egg today but ordered 3, 4 strips of bacon and coffee with powdered cream).  Patient continues to drink regular ginger ale and sweet tea made with sugar.  Reports drinking 2-3 glucerna per day.  Reports that he is usually just eating 1 good meal per day.  Likes fish sandwiches from [employer] but it comes back up sometimes.    Reports that he is not checking blood glucose due to it hurts his fingers.  Reports he has an appointment on April 23rd to see PCP. Reports he is taking diabetes medication.      Medications: reviewed  Labs: glucose 349  Anthropometrics:   Weight decreased to 187 lb on 3/27 from UBW of 190 lb per patient (last at that weight on 10/2016  Weight 195 lb on 3/8  4% weight loss in month   NUTRITION DIAGNOSIS: Predicted suboptimal nutrient intake continues   MALNUTRITION DIAGNOSIS: continue to monitor   INTERVENTION:   Encouraged patient to check blood glucose Encouraged patient to eat smooth, liquid foods and examples provided for patient to try.   Encouraged glucerna 3 times per day.  May need to switch to ensure plus to provide additional calories and protein if weight continues to decrease and control blood glucose with medication Discussed sugary beverages with patient as well again today.      MONITORING, EVALUATION, GOAL: weight trends, intake   NEXT VISIT: April 5th during infusion  Deyana Wnuk B. Zenia Resides, [place], [place] Registered Dietitian [phone] (pager)

## 2017-05-14 NOTE — Progress Notes (Signed)
To treatment area for hydration.  Patient stated prn nausea with prn constipation and diarrhea.  Has medications at home for management.  He is requesting nausea medicine today.  Next hydration is scheduled for Monday.  Family at side.  No s/s of distress noted.   Patient tolerated hydration with no complaints voiced.  Port site clean and dry with no bruising or swelling noted at site.  No complaints of pain with flush.  VSs with discharge and left ambulatory with family.  No s/s of distress noted.

## 2017-05-14 NOTE — Patient Instructions (Addendum)
[place] Discharge Instructions for Patients Receiving Chemotherapy  Today you received hydration.    If you develop nausea and vomiting that is not controlled by your nausea medication, call the clinic.   BELOW ARE SYMPTOMS THAT SHOULD BE REPORTED IMMEDIATELY:  *FEVER GREATER THAN 100.5 F  *CHILLS WITH OR WITHOUT FEVER  NAUSEA AND VOMITING THAT IS NOT CONTROLLED WITH YOUR NAUSEA MEDICATION  *UNUSUAL SHORTNESS OF BREATH  *UNUSUAL BRUISING OR BLEEDING  TENDERNESS IN MOUTH AND THROAT WITH OR WITHOUT PRESENCE OF ULCERS  *URINARY PROBLEMS  *BOWEL PROBLEMS  UNUSUAL RASH Items with * indicate a potential emergency and should be followed up as soon as possible.  Feel free to call the clinic should you have any questions or concerns. The clinic phone number is [phone].  Please show the [place] at check-in to the Emergency Department and triage nurse.

## 2017-05-17 ENCOUNTER — Inpatient Hospital Stay (HOSPITAL_COMMUNITY): Payer: Medicare Other | Attending: Internal Medicine

## 2017-05-17 ENCOUNTER — Ambulatory Visit
Admission: RE | Admit: 2017-05-17 | Discharge: 2017-05-17 | Disposition: A | Discharge status: Home / Self Care | Payer: Medicare Other | Source: Ambulatory Visit | Attending: Radiation Oncology | Admitting: Radiation Oncology

## 2017-05-17 ENCOUNTER — Encounter (HOSPITAL_COMMUNITY): Payer: Self-pay

## 2017-05-17 VITALS — BP 111/65 | HR 93 | Temp 98.0°F | Resp 18

## 2017-05-17 DIAGNOSIS — Z5111 Encounter for antineoplastic chemotherapy: Secondary | ICD-10-CM | POA: Diagnosis not present

## 2017-05-17 DIAGNOSIS — R011 Cardiac murmur, unspecified: Secondary | ICD-10-CM | POA: Diagnosis not present

## 2017-05-17 DIAGNOSIS — E7800 Pure hypercholesterolemia, unspecified: Secondary | ICD-10-CM | POA: Insufficient documentation

## 2017-05-17 DIAGNOSIS — I252 Old myocardial infarction: Secondary | ICD-10-CM | POA: Diagnosis not present

## 2017-05-17 DIAGNOSIS — C159 Malignant neoplasm of esophagus, unspecified: Secondary | ICD-10-CM

## 2017-05-17 DIAGNOSIS — Z87442 Personal history of urinary calculi: Secondary | ICD-10-CM | POA: Diagnosis not present

## 2017-05-17 DIAGNOSIS — F319 Bipolar disorder, unspecified: Secondary | ICD-10-CM | POA: Insufficient documentation

## 2017-05-17 DIAGNOSIS — I2510 Atherosclerotic heart disease of native coronary artery without angina pectoris: Secondary | ICD-10-CM | POA: Insufficient documentation

## 2017-05-17 DIAGNOSIS — D696 Thrombocytopenia, unspecified: Secondary | ICD-10-CM | POA: Insufficient documentation

## 2017-05-17 DIAGNOSIS — Z79899 Other long term (current) drug therapy: Secondary | ICD-10-CM | POA: Diagnosis not present

## 2017-05-17 DIAGNOSIS — C160 Malignant neoplasm of cardia: Secondary | ICD-10-CM | POA: Insufficient documentation

## 2017-05-17 DIAGNOSIS — R1310 Dysphagia, unspecified: Secondary | ICD-10-CM | POA: Diagnosis not present

## 2017-05-17 DIAGNOSIS — G4730 Sleep apnea, unspecified: Secondary | ICD-10-CM | POA: Diagnosis not present

## 2017-05-17 DIAGNOSIS — E1165 Type 2 diabetes mellitus with hyperglycemia: Secondary | ICD-10-CM | POA: Diagnosis not present

## 2017-05-17 DIAGNOSIS — I5042 Chronic combined systolic (congestive) and diastolic (congestive) heart failure: Secondary | ICD-10-CM | POA: Insufficient documentation

## 2017-05-17 DIAGNOSIS — I10 Essential (primary) hypertension: Secondary | ICD-10-CM | POA: Diagnosis not present

## 2017-05-17 DIAGNOSIS — M129 Arthropathy, unspecified: Secondary | ICD-10-CM | POA: Insufficient documentation

## 2017-05-17 DIAGNOSIS — R638 Other symptoms and signs concerning food and fluid intake: Secondary | ICD-10-CM

## 2017-05-17 DIAGNOSIS — Z510 Encounter for antineoplastic radiation therapy: Secondary | ICD-10-CM | POA: Diagnosis not present

## 2017-05-17 DIAGNOSIS — K219 Gastro-esophageal reflux disease without esophagitis: Secondary | ICD-10-CM | POA: Diagnosis not present

## 2017-05-17 MED ORDER — SODIUM CHLORIDE 0.9 % IV SOLN
Freq: Once | INTRAVENOUS | Status: AC
  Administered 2017-05-17: 10:00:00 via INTRAVENOUS
  Filled 2017-05-17: qty 4

## 2017-05-17 MED ORDER — SODIUM CHLORIDE 0.9% FLUSH
10.0000 mL | INTRAVENOUS | Status: DC | PRN
  Administered 2017-05-17: 10 mL
  Filled 2017-05-17: qty 10

## 2017-05-17 MED ORDER — SODIUM CHLORIDE 0.9 % IV SOLN
999.0000 mL | Freq: Once | INTRAVENOUS | Status: AC
  Administered 2017-05-17: 999 mL via INTRAVENOUS

## 2017-05-17 MED ORDER — HEPARIN SOD (PORK) LOCK FLUSH 100 UNIT/ML IV SOLN
500.0000 [IU] | Freq: Once | INTRAVENOUS | Status: AC | PRN
  Administered 2017-05-17: 500 [IU]

## 2017-05-17 NOTE — Progress Notes (Signed)
To treatment area for hydration.  Stated prn nausea and asking for nausea medication with hydration.  Prn diarrhea and constipation but managed with OTC medications.  Fatigue but no worsening.    Patient tolerated hydration with no complaints voiced.  Port site clean and dry with no bruising or swelling noted at site.  No complaints voiced with flush.  Band aid applied.  VSS with discharge and left ambulatory with no s/s of distress noted.

## 2017-05-17 NOTE — Patient Instructions (Signed)
[name] at [facility]  Discharge Instructions:  You received hydration today.  _______________________________________________________________  Thank you for choosing [place] at [facility] to provide your oncology and hematology care.  To afford each patient quality time with our providers, please arrive at least 15 minutes before your scheduled appointment.  You need to re-schedule your appointment if you arrive 10 or more minutes late.  We strive to give you quality time with our providers, and arriving late affects you and other patients whose appointments are after yours.  Also, if you no show three or more times for appointments you may be dismissed from the clinic.  Again, thank you for choosing [place] at [place] hope is that these requests will allow you access to exceptional care and in a timely manner. _______________________________________________________________  If you have questions after your visit, please contact our office at [phone] between the hours of 8:30 a.m. and 5:00 p.m. Voicemails left after 4:30 p.m. will not be returned until the following business day. _______________________________________________________________  For prescription refill requests, have your pharmacy contact our office. _______________________________________________________________  Recommendations made by the consultant and any test results will be sent to your referring physician. _______________________________________________________________

## 2017-05-18 ENCOUNTER — Ambulatory Visit
Admission: RE | Admit: 2017-05-18 | Discharge: 2017-05-18 | Disposition: A | Discharge status: Home / Self Care | Payer: Medicare Other | Source: Ambulatory Visit | Attending: Radiation Oncology | Admitting: Radiation Oncology

## 2017-05-18 DIAGNOSIS — C160 Malignant neoplasm of cardia: Secondary | ICD-10-CM | POA: Diagnosis not present

## 2017-05-18 DIAGNOSIS — Z510 Encounter for antineoplastic radiation therapy: Secondary | ICD-10-CM | POA: Diagnosis not present

## 2017-05-19 ENCOUNTER — Encounter (HOSPITAL_COMMUNITY): Payer: Self-pay | Admitting: Hematology

## 2017-05-19 ENCOUNTER — Inpatient Hospital Stay (HOSPITAL_BASED_OUTPATIENT_CLINIC_OR_DEPARTMENT_OTHER): Payer: Medicare Other | Admitting: Hematology

## 2017-05-19 ENCOUNTER — Other Ambulatory Visit: Payer: Self-pay

## 2017-05-19 ENCOUNTER — Encounter (HOSPITAL_COMMUNITY): Payer: Self-pay

## 2017-05-19 ENCOUNTER — Ambulatory Visit
Admission: RE | Admit: 2017-05-19 | Discharge: 2017-05-19 | Disposition: A | Discharge status: Home / Self Care | Payer: Medicare Other | Source: Ambulatory Visit | Attending: Radiation Oncology | Admitting: Radiation Oncology

## 2017-05-19 ENCOUNTER — Inpatient Hospital Stay (HOSPITAL_COMMUNITY): Payer: Medicare Other

## 2017-05-19 VITALS — BP 119/82 | HR 88 | Temp 97.7°F | Resp 18 | Wt 185.5 lb

## 2017-05-19 DIAGNOSIS — R011 Cardiac murmur, unspecified: Secondary | ICD-10-CM | POA: Diagnosis not present

## 2017-05-19 DIAGNOSIS — D696 Thrombocytopenia, unspecified: Secondary | ICD-10-CM

## 2017-05-19 DIAGNOSIS — Z87442 Personal history of urinary calculi: Secondary | ICD-10-CM

## 2017-05-19 DIAGNOSIS — R1310 Dysphagia, unspecified: Secondary | ICD-10-CM | POA: Diagnosis not present

## 2017-05-19 DIAGNOSIS — Z95828 Presence of other vascular implants and grafts: Secondary | ICD-10-CM

## 2017-05-19 DIAGNOSIS — F319 Bipolar disorder, unspecified: Secondary | ICD-10-CM | POA: Diagnosis not present

## 2017-05-19 DIAGNOSIS — E7800 Pure hypercholesterolemia, unspecified: Secondary | ICD-10-CM

## 2017-05-19 DIAGNOSIS — Z5111 Encounter for antineoplastic chemotherapy: Secondary | ICD-10-CM | POA: Diagnosis not present

## 2017-05-19 DIAGNOSIS — I5042 Chronic combined systolic (congestive) and diastolic (congestive) heart failure: Secondary | ICD-10-CM

## 2017-05-19 DIAGNOSIS — K219 Gastro-esophageal reflux disease without esophagitis: Secondary | ICD-10-CM | POA: Diagnosis not present

## 2017-05-19 DIAGNOSIS — I10 Essential (primary) hypertension: Secondary | ICD-10-CM

## 2017-05-19 DIAGNOSIS — I2510 Atherosclerotic heart disease of native coronary artery without angina pectoris: Secondary | ICD-10-CM

## 2017-05-19 DIAGNOSIS — M129 Arthropathy, unspecified: Secondary | ICD-10-CM | POA: Diagnosis not present

## 2017-05-19 DIAGNOSIS — C160 Malignant neoplasm of cardia: Secondary | ICD-10-CM | POA: Diagnosis not present

## 2017-05-19 DIAGNOSIS — C158 Malignant neoplasm of overlapping sites of esophagus: Secondary | ICD-10-CM

## 2017-05-19 DIAGNOSIS — E1165 Type 2 diabetes mellitus with hyperglycemia: Secondary | ICD-10-CM | POA: Diagnosis not present

## 2017-05-19 DIAGNOSIS — Z79899 Other long term (current) drug therapy: Secondary | ICD-10-CM

## 2017-05-19 DIAGNOSIS — Z510 Encounter for antineoplastic radiation therapy: Secondary | ICD-10-CM | POA: Diagnosis not present

## 2017-05-19 DIAGNOSIS — I252 Old myocardial infarction: Secondary | ICD-10-CM

## 2017-05-19 DIAGNOSIS — G4730 Sleep apnea, unspecified: Secondary | ICD-10-CM

## 2017-05-19 LAB — CBC WITH DIFFERENTIAL/PLATELET
BASOS PCT: 0 %
Basophils Absolute: 0 10*3/uL (ref 0.0–0.1)
Eosinophils Absolute: 0.1 10*3/uL (ref 0.0–0.7)
Eosinophils Relative: 3 %
HEMATOCRIT: 42.8 % (ref 39.0–52.0)
Hemoglobin: 14.3 g/dL (ref 13.0–17.0)
Lymphocytes Relative: 16 %
Lymphs Abs: 0.8 10*3/uL (ref 0.7–4.0)
MCH: 28.7 pg (ref 26.0–34.0)
MCHC: 33.4 g/dL (ref 30.0–36.0)
MCV: 85.9 fL (ref 78.0–100.0)
MONO ABS: 0.2 10*3/uL (ref 0.1–1.0)
MONOS PCT: 3 %
NEUTROS ABS: 4 10*3/uL (ref 1.7–7.7)
Neutrophils Relative %: 78 %
PLATELETS: 52 10*3/uL — AB (ref 150–400)
RBC: 4.98 MIL/uL (ref 4.22–5.81)
RDW: 15.6 % — AB (ref 11.5–15.5)
WBC: 5.1 10*3/uL (ref 4.0–10.5)

## 2017-05-19 LAB — COMPREHENSIVE METABOLIC PANEL
ALK PHOS: 100 U/L (ref 38–126)
ALT: 11 U/L — ABNORMAL LOW (ref 17–63)
ANION GAP: 9 (ref 5–15)
AST: 15 U/L (ref 15–41)
Albumin: 3.2 g/dL — ABNORMAL LOW (ref 3.5–5.0)
BILIRUBIN TOTAL: 0.6 mg/dL (ref 0.3–1.2)
BUN: 11 mg/dL (ref 6–20)
CALCIUM: 8.6 mg/dL — AB (ref 8.9–10.3)
CO2: 28 mmol/L (ref 22–32)
Chloride: 98 mmol/L — ABNORMAL LOW (ref 101–111)
Creatinine, Ser: 1.18 mg/dL (ref 0.61–1.24)
GFR calc Af Amer: >60 mL/min (ref >60)
GFR calc non Af Amer: >60 mL/min (ref >60)
Glucose, Bld: 363 mg/dL — ABNORMAL HIGH (ref 65–99)
Potassium: 4 mmol/L (ref 3.5–5.1)
SODIUM: 135 mmol/L (ref 135–145)
Total Protein: 6.6 g/dL (ref 6.5–8.1)

## 2017-05-19 MED ORDER — HEPARIN SOD (PORK) LOCK FLUSH 100 UNIT/ML IV SOLN
500.0000 [IU] | Freq: Once | INTRAVENOUS | Status: AC
  Administered 2017-05-19: 500 [IU] via INTRAVENOUS

## 2017-05-19 MED ORDER — STERILE WATER FOR INJECTION IJ SOLN
INTRAMUSCULAR | Status: AC
  Filled 2017-05-19: qty 10

## 2017-05-19 MED ORDER — ALTEPLASE 2 MG IJ SOLR
2.0000 mg | Freq: Once | INTRAMUSCULAR | Status: AC
  Administered 2017-05-19: 2 mg
  Filled 2017-05-19: qty 2

## 2017-05-19 MED ORDER — SODIUM CHLORIDE 0.9% FLUSH
10.0000 mL | Freq: Once | INTRAVENOUS | Status: AC
  Administered 2017-05-19: 10 mL via INTRAVENOUS

## 2017-05-19 NOTE — Patient Instructions (Signed)
[place] at [facility]  Discharge Instructions:  You were not treated due to low platelets.  Keep appointment for hydration as scheduled.    Thrombocytopenia Thrombocytopenia means that you have a low number of platelets in your blood. Platelets are tiny cells in the blood. When you bleed, they clump together at the cut or injury to stop the bleeding. This is called blood clotting. Not having enough platelets can cause bleeding problems. Follow these instructions at home: General instructions  Check your skin and inside your mouth for bruises or blood as told by your doctor.  Check to see if there is blood in your spit (sputum), pee (urine), and poop (stool). Do this as told by your doctor.  Ask your doctor if you can drink alcohol.  Take over-the-counter and prescription medicines only as told by your doctor.  Tell all of your doctors that you have this condition. Be sure to tell your dentist and eye doctor too. Activity  Do not do activities that can cause bumps or bruises until your doctor says it is okay.  Be careful not to cut yourself: ? When you shave. ? When you use scissors, needles, knives, or other tools.  Be careful not to burn yourself: ? When you use an iron. ? When you cook. Contact a doctor if:  You have bruises and you do not know why. Get help right away if:  You are bleeding anywhere on your body.  You have blood in your spit, pee, or poop. This information is not intended to replace advice given to you by your health care provider. Make sure you discuss any questions you have with your health care provider. Document Released: 01/22/2011 Document Revised: 10/06/2015 Document Reviewed: 08/06/2014 Elsevier Interactive Patient Education  2018 [employer].  _______________________________________________________________  Thank you for choosing [place] at [facility] to provide your oncology and  hematology care.  To afford each patient quality time with our providers, please arrive at least 15 minutes before your scheduled appointment.  You need to re-schedule your appointment if you arrive 10 or more minutes late.  We strive to give you quality time with our providers, and arriving late affects you and other patients whose appointments are after yours.  Also, if you no show three or more times for appointments you may be dismissed from the clinic.  Again, thank you for choosing [place] at [facility] hope is that these requests will allow you access to exceptional care and in a timely manner. _______________________________________________________________  If you have questions after your visit, please contact our office at [phone] between the hours of 8:30 a.m. and 5:00 p.m. Voicemails left after 4:30 p.m. will not be returned until the following business day. _______________________________________________________________  For prescription refill requests, have your pharmacy contact our office. _______________________________________________________________  Recommendations made by the consultant and any test results will be sent to your referring physician. _______________________________________________________________

## 2017-05-19 NOTE — Assessment & Plan Note (Signed)
1.  Distal esophageal adenocarcinoma: -Radiation therapy started on 04/19/2017, week 1 of carboplatin and paclitaxel on 04/21/2017, week 2 on 04/28/2017 - Chemotherapy held for the last 2 weeks secondary to thrombocytopenia -Today his platelet count is further down to 50,000 range.  Hence I would continue to hold chemotherapy.  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 -We will reevaluate his blood counts next week.  2.  Diabetes: He is taking Tradjenta 5 mg daily.  He had poorly controlled blood sugars for the last few weeks.  We have made a referral to consult with endocrinology.  Patient has an appointment on 05/27/2017 with Dr. Dorris Fetch.  3.  Difficulty swallowing: He has difficulty eating breads and meat.  He was able to eat oatmeal and bacon this morning.  He is drinking about 3 cans of either boost or Glucerna.

## 2017-05-19 NOTE — Progress Notes (Signed)
Patient to treatment area.  More fatigue per patient.  Stated he can swallow liquids and solids but he is starting to notice a difference.  Denied foods or solids sticking with swallowing. Denied neuropathy.  Prn nausea.   No complaints of diarrhea or constipation.  Caregiver at side.   To treatment area for oncology follow up, labs, and chemotherapy.  No blood return from port after several saline flushes.  Patient denied pain.  No resistance with flushes.  Peripheral IV for labs.  Reviewed port with Dr. Delton Coombes with verbal orders for Alteplase.    After 60 minutes of Alteplase good blood return noted.  Waste of 37ml blood before flushing with saline and heparin.  No complaints of pain at site. No bruising or swelling noted at site. Band aid applied.  Patient not to be treated due to platelets 52 today per Dr. Delton Coombes.  Reviewed low platelet precautions with the patient with understanding verbalized.  Patient discharge with no s/s of distress noted.

## 2017-05-19 NOTE — Progress Notes (Signed)
[address], [place] [address]   CLINIC:  Medical Oncology/Hematology  PCP:  Sinda Du, [place] [address] [phone]   REASON FOR VISIT:  Follow-up for chemotherapy.  CURRENT THERAPY: Weekly carboplatin and paclitaxel.   INTERVAL HISTORY:  [name] 67 y.o. male returns for follow-up visit and next weekly dose of chemotherapy.  He has not received chemotherapy for the past 2 weeks because of thrombus cytopenia.  He denies any easy bruising or bleeding.  Denies any tingling or numbness next with this.  He denies any nausea or vomiting.  He threw up food only when he tried to eat bread and meat.  He is continuing daily radiation at [facility].  Denies any fevers or infections.    REVIEW OF SYSTEMS:  Review of Systems  Constitutional: Positive for fatigue.  HENT:  Negative.   Eyes: Negative.   Respiratory: Negative.   Cardiovascular: Negative.   Gastrointestinal: Positive for vomiting.  Genitourinary: Negative.    Musculoskeletal: Negative.   Skin: Negative.   Neurological: Negative.   Psychiatric/Behavioral: Negative.      PAST MEDICAL/SURGICAL HISTORY:  Past Medical History:  Diagnosis Date  . Arthritis   . Bipolar disorder ([place])   . CAD (coronary artery disease) 08/16/2016  . Cellulitis and abscess of foot 03/05/2016  . Chronic combined systolic and diastolic heart failure ([place])   . Diabetes mellitus without complication ([place])    boderline  . GERD (gastroesophageal reflux disease)   . Heart murmur 1995  . History of kidney stones   . Hypercholesteremia   . Hypertension   . Kidney stones   . Myocardial infarction ([place])   . OSA (obstructive sleep apnea)    no cpap, can't tolerate  . Schizoaffective disorder, bipolar type ([place]) 04/25/2016   Past Surgical History:  Procedure Laterality Date  . APPENDECTOMY    . BIOPSY  03/09/2017   Procedure: BIOPSY;  Surgeon: Danie Binder,  MD;  Location: [facility] ENDO SUITE;  Service: Endoscopy;;  gastric esophageal  . CHOLECYSTECTOMY N/A 10/20/2013   Procedure: LAPAROSCOPIC CHOLECYSTECTOMY;  Surgeon: [name], MD;  Location: [facility] ORS;  Service: General;  Laterality: N/A;  . COLONOSCOPY WITH PROPOFOL N/A 03/09/2017   Procedure: COLONOSCOPY WITH PROPOFOL;  Surgeon: Danie Binder, MD;  Location: [facility] ENDO SUITE;  Service: Endoscopy;  Laterality: N/A;  12:15pm  . ESOPHAGOGASTRODUODENOSCOPY (EGD) WITH PROPOFOL N/A 03/09/2017   Procedure: ESOPHAGOGASTRODUODENOSCOPY (EGD) WITH PROPOFOL;  Surgeon: Danie Binder, MD;  Location: [facility] ENDO SUITE;  Service: Endoscopy;  Laterality: N/A;  . EUS N/A 04/08/2017   Procedure: UPPER ENDOSCOPIC ULTRASOUND (EUS) RADIAL;  Surgeon: Milus Banister, MD;  Location: [facility] ENDOSCOPY;  Service: Endoscopy;  Laterality: N/A;  . HIP SURGERY Left   . KNEE SURGERY Left   . POLYPECTOMY  03/09/2017   Procedure: POLYPECTOMY;  Surgeon: Danie Binder, MD;  Location: [facility] ENDO SU[facility];  Service: Endoscopy;;  colon   . PORTACATH PLACEMENT Left 04/07/2017   Procedure: INSERTION PORT-A-CATH LEFT SUBCLAVIAN;  Surgeon: Virl Cagey, MD;  Location: [facility] ORS;  Service: General;  Laterality: Left;  . SAVORY DILATION N/A 03/09/2017   Procedure: SAVORY DILATION;  Surgeon: Danie Binder, MD;  Location: [facility] ENDO [facility];  Service: Endoscopy;  Laterality: N/A;     SOCIAL HISTORY:  Social History   Socioeconomic History  . Marital status: Divorced    Spouse name: Not on file  . Number  of children: Not on file  . Years of education: Not on file  . Highest education level: Not on file  Occupational History  . Not on file  Social Needs  . Financial resource strain: Not on file  . Food insecurity:    Worry: Not on file    Inability: Not on file  . Transportation needs:    Medical: Not on file    Non-medical: Not on file  Tobacco Use  . Smoking status: Never Smoker  . Smokeless tobacco: Never Used  Substance and Sexual  Activity  . Alcohol use: No  . Drug use: No  . Sexual activity: Not Currently    Birth control/protection: Implant  Lifestyle  . Physical activity:    Days per week: Not on file    Minutes per session: Not on file  . Stress: Not on file  Relationships  . Social connections:    Talks on phone: Not on file    Gets together: Not on file    Attends religious service: Not on file    Active member of club or organization: Not on file    Attends meetings of clubs or organizations: Not on file    Relationship status: Not on file  . Intimate partner violence:    Fear of current or ex partner: Not on file    Emotionally abused: Not on file    Physically abused: Not on file    Forced sexual activity: Not on file  Other Topics Concern  . Not on file  Social History Narrative  . Not on file    FAMILY HISTORY:  Family History  Problem Relation Age of Onset  . Hypertension Mother   . Dementia Mother   . Cancer Mother        breast  . Colon cancer Neg Hx   . Colon polyps Neg Hx     CURRENT MEDICATIONS:  Outpatient Encounter Medications as of 05/19/2017  Medication Sig  . acetaminophen (TYLENOL) 325 MG tablet Take 650 mg by mouth every 6 (six) hours as needed.  . AMITIZA 24 MCG capsule Take 24 mcg by mouth 2 (two) times a week.   Marland Kitchen aspirin EC 81 MG tablet Take 81 mg by mouth daily.  Marland Kitchen CARBOPLATIN IV Inject into the vein.  Marland Kitchen dexamethasone (DECADRON) 4 MG tablet Take 2 tablets (8 mg total) by mouth daily. Start the day after chemotherapy for 2 days.  Marland Kitchen dexlansoprazole (DEXILANT) 60 MG capsule Take 1 capsule (60 mg total) by mouth daily.  . furosemide (LASIX) 40 MG tablet Alternate 40 mg one day and 20 mg the next (Patient taking differently: Take 20-40 mg by mouth See admin instructions. Alternate 40 mg one day and 20 mg the next)  . lidocaine (XYLOCAINE) 2 % solution   . lidocaine-prilocaine (EMLA) cream Apply a quarter size amount to affected area 1 hour prior to coming to  chemotherapy. Do not rub in. Cover with plastic wrap.  . linagliptin (TRADJENTA) 5 MG TABS tablet Take 5 mg by mouth daily.  . magic mouthwash w/lidocaine SOLN Take 5 mLs by mouth 4 (four) times daily as needed for mouth pain.  Marland Kitchen OLANZapine (ZYPREXA) 5 MG tablet Take 5 mg by mouth 2 (two) times daily.  . ondansetron (ZOFRAN ODT) 8 MG disintegrating tablet Take 1 tablet (8 mg total) by mouth every 8 (eight) hours as needed for nausea or vomiting.  Marland Kitchen oxyCODONE (ROXICODONE) 5 MG immediate release tablet Take 1 tablet (5 mg  total) by mouth every 4 (four) hours as needed.  Marland Kitchen PACLitaxel (TAXOL IV) Inject into the vein.  . potassium chloride SA (K-DUR,KLOR-CON) 20 MEQ tablet Take 20 mEq by mouth daily.  . prochlorperazine (COMPAZINE) 10 MG tablet Take 1 tablet (10 mg total) by mouth every 6 (six) hours as needed (Nausea or vomiting).  . sacubitril-valsartan (ENTRESTO) 49-51 MG Take 1 tablet by mouth 2 (two) times daily.  . prochlorperazine (COMPAZINE) 10 MG tablet Take 1 tablet (10 mg total) by mouth once for 1 dose.  . rosuvastatin (CRESTOR) 10 MG tablet Take 1 tablet (10 mg total) by mouth daily.   No facility-administered encounter medications on file as of 05/19/2017.     ALLERGIES:  Allergies  Allergen Reactions  . Codeine Nausea And Vomiting  . Morphine And Related Nausea And Vomiting     PHYSICAL EXAM:  ECOG Performance status: 1 I have reviewed his vital signs.   LABORATORY DATA:  I have reviewed the labs as listed.  CBC    Component Value Date/Time   WBC 5.1 05/19/2017 1006   RBC 4.98 05/19/2017 1006   HGB 14.3 05/19/2017 1006   HCT 42.8 05/19/2017 1006   PLT 52 (L) 05/19/2017 1006   MCV 85.9 05/19/2017 1006   MCH 28.7 05/19/2017 1006   MCHC 33.4 05/19/2017 1006   RDW 15.6 (H) 05/19/2017 1006   LYMPHSABS 0.8 05/19/2017 1006   MONOABS 0.2 05/19/2017 1006   EOSABS 0.1 05/19/2017 1006   BASOSABS 0.0 05/19/2017 1006   CMP Latest Ref Rng & Units 05/19/2017 05/12/2017 05/05/2017   Glucose 65 - 99 mg/dL 363(H) 349(H) 349(H)  BUN 6 - 20 mg/dL 11 12 18   Creatinine 0.61 - 1.24 mg/dL 1.18 1.03 1.13  Sodium 135 - 145 mmol/L 135 133(L) 131(L)  Potassium 3.5 - 5.1 mmol/L 4.0 3.9 4.2  Chloride 101 - 111 mmol/L 98(L) 97(L) 97(L)  CO2 22 - 32 mmol/L 28 26 24   Calcium 8.9 - 10.3 mg/dL 8.6(L) 8.7(L) 8.2(L)  Total Protein 6.5 - 8.1 g/dL 6.6 6.7 6.4(L)  Total Bilirubin 0.3 - 1.2 mg/dL 0.6 0.8 0.8  Alkaline Phos 38 - 126 U/L 100 101 111  AST 15 - 41 U/L 15 15 23   ALT 17 - 63 U/L 11(L) 12(L) 12(L)        ASSESSMENT & PLAN:   Malignant neoplasm of cardio-esophageal junction (HCC) 1.  Distal esophageal adenocarcinoma: -Radiation therapy started on 04/19/2017, week 1 of carboplatin and paclitaxel on 04/21/2017, week 2 on 04/28/2017 - Chemotherapy held for the last 2 weeks secondary to thrombocytopenia -Today his platelet count is further down to 50,000 range.  Hence I would continue to hold chemotherapy.  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 -We will reevaluate his blood counts next week.  2.  Diabetes: He is taking Tradjenta 5 mg daily.  He had poorly controlled blood sugars for the last few weeks.  We have made a referral to consult with endocrinology.  Patient has an appointment on 05/27/2017 with Dr. Dorris Fetch.  3.  Difficulty swallowing: He has difficulty eating breads and meat.  He was able to eat oatmeal and bacon this morning.  He is drinking about 3 cans of either boost or Glucerna.      Orders placed this encounter:  Orders Placed This Encounter  Procedures  . CBC with Differential  . Comprehensive metabolic panel      Derek Jack, MD [place] [phone]

## 2017-05-20 ENCOUNTER — Ambulatory Visit
Admission: RE | Admit: 2017-05-20 | Discharge: 2017-05-20 | Disposition: A | Discharge status: Home / Self Care | Payer: Medicare Other | Source: Ambulatory Visit | Attending: Radiation Oncology | Admitting: Radiation Oncology

## 2017-05-20 DIAGNOSIS — C160 Malignant neoplasm of cardia: Secondary | ICD-10-CM | POA: Diagnosis not present

## 2017-05-20 DIAGNOSIS — Z510 Encounter for antineoplastic radiation therapy: Secondary | ICD-10-CM | POA: Diagnosis not present

## 2017-05-21 ENCOUNTER — Encounter (HOSPITAL_COMMUNITY): Payer: Self-pay

## 2017-05-21 ENCOUNTER — Ambulatory Visit
Admission: RE | Admit: 2017-05-21 | Discharge: 2017-05-21 | Disposition: A | Discharge status: Home / Self Care | Payer: Medicare Other | Source: Ambulatory Visit | Attending: Radiation Oncology | Admitting: Radiation Oncology

## 2017-05-21 ENCOUNTER — Inpatient Hospital Stay (HOSPITAL_COMMUNITY): Payer: Medicare Other

## 2017-05-21 VITALS — BP 116/78 | HR 92 | Temp 97.7°F | Resp 18 | Wt 183.4 lb

## 2017-05-21 DIAGNOSIS — Z510 Encounter for antineoplastic radiation therapy: Secondary | ICD-10-CM | POA: Diagnosis not present

## 2017-05-21 DIAGNOSIS — M129 Arthropathy, unspecified: Secondary | ICD-10-CM | POA: Diagnosis not present

## 2017-05-21 DIAGNOSIS — E1165 Type 2 diabetes mellitus with hyperglycemia: Secondary | ICD-10-CM | POA: Diagnosis not present

## 2017-05-21 DIAGNOSIS — C160 Malignant neoplasm of cardia: Secondary | ICD-10-CM

## 2017-05-21 DIAGNOSIS — R638 Other symptoms and signs concerning food and fluid intake: Secondary | ICD-10-CM

## 2017-05-21 DIAGNOSIS — Z5111 Encounter for antineoplastic chemotherapy: Secondary | ICD-10-CM | POA: Diagnosis not present

## 2017-05-21 DIAGNOSIS — D696 Thrombocytopenia, unspecified: Secondary | ICD-10-CM | POA: Diagnosis not present

## 2017-05-21 DIAGNOSIS — R1310 Dysphagia, unspecified: Secondary | ICD-10-CM | POA: Diagnosis not present

## 2017-05-21 DIAGNOSIS — C159 Malignant neoplasm of esophagus, unspecified: Secondary | ICD-10-CM

## 2017-05-21 MED ORDER — HEPARIN SOD (PORK) LOCK FLUSH 100 UNIT/ML IV SOLN
500.0000 [IU] | Freq: Once | INTRAVENOUS | Status: AC | PRN
  Administered 2017-05-21: 500 [IU]

## 2017-05-21 MED ORDER — SODIUM CHLORIDE 0.9 % IV SOLN
Freq: Once | INTRAVENOUS | Status: AC
  Administered 2017-05-21: 11:00:00 via INTRAVENOUS
  Filled 2017-05-21: qty 2

## 2017-05-21 MED ORDER — SODIUM CHLORIDE 0.9% FLUSH
10.0000 mL | INTRAVENOUS | Status: DC | PRN
  Administered 2017-05-21: 10 mL
  Filled 2017-05-21: qty 10

## 2017-05-21 MED ORDER — SODIUM CHLORIDE 0.9 % IV SOLN
Freq: Once | INTRAVENOUS | Status: AC
  Administered 2017-05-21: 10:00:00 via INTRAVENOUS

## 2017-05-21 NOTE — Patient Instructions (Addendum)
[place] at [facility] Discharge Instructions  Received 3 hours of hydration with NS today with Zofran IV. Follow-up as scheduled. Call clinic for any questions or concerns   Thank you for choosing [place] at [facility] to provide your oncology and hematology care.  To afford each patient quality time with our provider, please arrive at least 15 minutes before your scheduled appointment time.   If you have a lab appointment with the [facility] please come in thru the  Main Entrance and check in at the main information desk  You need to re-schedule your appointment should you arrive 10 or more minutes late.  We strive to give you quality time with our providers, and arriving late affects you and other patients whose appointments are after yours.  Also, if you no show three or more times for appointments you may be dismissed from the clinic at the providers discretion.     Again, thank you for choosing [facility].  Our hope is that these requests will decrease the amount of time that you wait before being seen by our physicians.       _____________________________________________________________  Should you have questions after your visit to [facility], please contact our office at [phone] between the hours of 8:30 a.m. and 4:30 p.m.  Voicemails left after 4:30 p.m. will not be returned until the following business day.  For prescription refill requests, have your pharmacy contact our office.       Resources For Cancer Patients and their Caregivers ? American Cancer Society: Can assist with transportation, wigs, general needs, runs Look Good Feel Better.        870-179-2934 ? Cancer Care: Provides financial assistance, online support groups, medication/co-pay assistance.  1-800-813-HOPE (218) 043-4764) ? [place] Assists [place] cancer patients and their families through  emotional , educational and financial support.  984-046-8268 ? [place] DSS Where to apply for food stamps, Medicaid and utility assistance. 248-511-5671 ? RCATS: Transportation to medical appointments. (973) 669-4531 ? Social Security Administration: May apply for disability if have a Stage IV cancer. (813)437-8584 515-225-3759 ? [employer], Disability and Transit Services: Assists with nutrition, care and transit needs. [place] Support Programs:   > Cancer Support Group  2nd Tuesday of the month 1pm-2pm, Journey Room   > Creative Journey  3rd Tuesday of the month 1130am-1pm, Journey Room

## 2017-05-21 NOTE — Progress Notes (Signed)
[name] tolerated 3 hour hydration with Zofran infusion well without complaints or incident. VSS upon discharge. Pt discharged via wheelchair in satisfactory condition accompanied by a friend

## 2017-05-21 NOTE — Progress Notes (Signed)
Nutrition Follow-up:  Patient with esophageal cancer extending to fundus.  Patient receiving chemotherapy and radiation therapy.  Chemotherapy on hold due to labs.  Met with patient during IV fluids today.  Patient reports that he has been able to eat oatmeal and 4 strips of bacon for breakfast (with brown sugar, butter and cinnamon added). Drinking 2-3 shakes per day (boost, glucerna).  Continues to report that he is eating mainly pudding around lunch time and supper time.  "I only eat one good meal per day."    Medications: reviewed  Labs: reviewed  Anthropometrics:   Noted weight of 185 lb on 4/3.  UBW of 190 lb (10/2016).  Weight measured on 3/8 195 lb    NUTRITION DIAGNOSIS: Predicted suboptimal nutrient intake continues   MALNUTRITION DIAGNOSIS: continue to monitor   INTERVENTION:   Noted planning to meet with endocrinology on 4/11.   Reviewed with patient creamy soups, frozen microwave meals would be good options to try for lunch and dinner.  (soft foods with gravy added). Encouraged at least 3 shakes per day may need to move to 4 per day if weight continues to decrease. Would prefer higher calorie shakes vs glucerna (180 calories vs 350 calories) due to limited intake.  Would encouraged medication adjustment for diabetes vs food restriction at this time. Talked about breakfast options as well to add protein to meal.      MONITORING, EVALUATION, GOAL: weight trends, intake, blood glucose    NEXT VISIT: April 12 during infusion  Amerika Nourse B. Zenia Resides, [place], [place] Registered Dietitian [phone] (pager)

## 2017-05-24 ENCOUNTER — Ambulatory Visit
Admission: RE | Admit: 2017-05-24 | Discharge: 2017-05-24 | Disposition: A | Discharge status: Home / Self Care | Payer: Medicare Other | Source: Ambulatory Visit | Attending: Radiation Oncology | Admitting: Radiation Oncology

## 2017-05-24 ENCOUNTER — Encounter (HOSPITAL_COMMUNITY): Payer: Self-pay

## 2017-05-24 ENCOUNTER — Inpatient Hospital Stay (HOSPITAL_COMMUNITY): Payer: Medicare Other

## 2017-05-24 VITALS — BP 107/59 | HR 88 | Temp 97.6°F | Resp 18 | Wt 183.0 lb

## 2017-05-24 DIAGNOSIS — M129 Arthropathy, unspecified: Secondary | ICD-10-CM | POA: Diagnosis not present

## 2017-05-24 DIAGNOSIS — E1165 Type 2 diabetes mellitus with hyperglycemia: Secondary | ICD-10-CM | POA: Diagnosis not present

## 2017-05-24 DIAGNOSIS — C160 Malignant neoplasm of cardia: Secondary | ICD-10-CM | POA: Diagnosis not present

## 2017-05-24 DIAGNOSIS — R638 Other symptoms and signs concerning food and fluid intake: Secondary | ICD-10-CM

## 2017-05-24 DIAGNOSIS — Z5111 Encounter for antineoplastic chemotherapy: Secondary | ICD-10-CM | POA: Diagnosis not present

## 2017-05-24 DIAGNOSIS — C159 Malignant neoplasm of esophagus, unspecified: Secondary | ICD-10-CM

## 2017-05-24 DIAGNOSIS — Z510 Encounter for antineoplastic radiation therapy: Secondary | ICD-10-CM | POA: Diagnosis not present

## 2017-05-24 DIAGNOSIS — D696 Thrombocytopenia, unspecified: Secondary | ICD-10-CM | POA: Diagnosis not present

## 2017-05-24 DIAGNOSIS — R1310 Dysphagia, unspecified: Secondary | ICD-10-CM | POA: Diagnosis not present

## 2017-05-24 MED ORDER — SODIUM CHLORIDE 0.9 % IV SOLN
Freq: Once | INTRAVENOUS | Status: AC
  Administered 2017-05-24: 10:00:00 via INTRAVENOUS

## 2017-05-24 MED ORDER — SODIUM CHLORIDE 0.9% FLUSH
10.0000 mL | INTRAVENOUS | Status: DC | PRN
  Administered 2017-05-24: 10 mL
  Filled 2017-05-24: qty 10

## 2017-05-24 MED ORDER — HEPARIN SOD (PORK) LOCK FLUSH 100 UNIT/ML IV SOLN
500.0000 [IU] | Freq: Once | INTRAVENOUS | Status: AC | PRN
  Administered 2017-05-24: 500 [IU]

## 2017-05-24 NOTE — Progress Notes (Signed)
1130 Pt reports feeling dizzy and a little light-headed with skin warm and dry. He denies any SOB or discomfort VSS Pt reclined in chair with feet elevated. Pt hasn't had anything to eat since breakfast this morning and he took his Tradjenta with breakfast. Pt given peanut butter crackers and a coke. 1145 Pt reports feeling much better.                    [name] tolerated hydration with NS over 3 hrs well.He denied any nausea. VSS upon discharge. Pt discharged via wheelchair in satisfactory condition accompanied by a friend

## 2017-05-24 NOTE — Patient Instructions (Signed)
[place] at [facility] Discharge Instructions  Received 3 hours of hydration with NS today. Follow-up as scheduled. Call clinic for any questions or concerns   Thank you for choosing [place] at [facility] to provide your oncology and hematology care.  To afford each patient quality time with our provider, please arrive at least 15 minutes before your scheduled appointment time.   If you have a lab appointment with the [facility] please come in thru the  Main Entrance and check in at the main information desk  You need to re-schedule your appointment should you arrive 10 or more minutes late.  We strive to give you quality time with our providers, and arriving late affects you and other patients whose appointments are after yours.  Also, if you no show three or more times for appointments you may be dismissed from the clinic at the providers discretion.     Again, thank you for choosing [facility].  Our hope is that these requests will decrease the amount of time that you wait before being seen by our physicians.       _____________________________________________________________  Should you have questions after your visit to [facility], please contact our office at [phone] between the hours of 8:30 a.m. and 4:30 p.m.  Voicemails left after 4:30 p.m. will not be returned until the following business day.  For prescription refill requests, have your pharmacy contact our office.       Resources For Cancer Patients and their Caregivers ? American Cancer Society: Can assist with transportation, wigs, general needs, runs Look Good Feel Better.        (903)529-7980 ? Cancer Care: Provides financial assistance, online support groups, medication/co-pay assistance.  1-800-813-HOPE (640)352-0255) ? [place] Assists [place] cancer patients and their families through emotional ,  educational and financial support.  512-476-2123 ? [place] DSS Where to apply for food stamps, Medicaid and utility assistance. (606)174-3242 ? RCATS: Transportation to medical appointments. 979 017 1947 ? Social Security Administration: May apply for disability if have a Stage IV cancer. 289-565-4989 308-216-1484 ? [employer], Disability and Transit Services: Assists with nutrition, care and transit needs. [place] Support Programs:   > Cancer Support Group  2nd Tuesday of the month 1pm-2pm, Journey Room   > Creative Journey  3rd Tuesday of the month 1130am-1pm, Journey Room

## 2017-05-25 ENCOUNTER — Ambulatory Visit
Admission: RE | Admit: 2017-05-25 | Discharge: 2017-05-25 | Disposition: A | Discharge status: Home / Self Care | Payer: Medicare Other | Source: Ambulatory Visit | Attending: Radiation Oncology | Admitting: Radiation Oncology

## 2017-05-25 DIAGNOSIS — Z510 Encounter for antineoplastic radiation therapy: Secondary | ICD-10-CM | POA: Diagnosis not present

## 2017-05-25 DIAGNOSIS — C160 Malignant neoplasm of cardia: Secondary | ICD-10-CM | POA: Diagnosis not present

## 2017-05-26 ENCOUNTER — Encounter (HOSPITAL_COMMUNITY): Payer: Self-pay

## 2017-05-26 ENCOUNTER — Ambulatory Visit
Admission: RE | Admit: 2017-05-26 | Discharge: 2017-05-26 | Disposition: A | Discharge status: Home / Self Care | Payer: Medicare Other | Source: Ambulatory Visit | Attending: Radiation Oncology | Admitting: Radiation Oncology

## 2017-05-26 ENCOUNTER — Encounter: Payer: Self-pay | Admitting: Radiation Oncology

## 2017-05-26 ENCOUNTER — Inpatient Hospital Stay (HOSPITAL_COMMUNITY): Payer: Medicare Other

## 2017-05-26 DIAGNOSIS — D696 Thrombocytopenia, unspecified: Secondary | ICD-10-CM | POA: Diagnosis not present

## 2017-05-26 DIAGNOSIS — C160 Malignant neoplasm of cardia: Secondary | ICD-10-CM | POA: Diagnosis not present

## 2017-05-26 DIAGNOSIS — Z510 Encounter for antineoplastic radiation therapy: Secondary | ICD-10-CM | POA: Diagnosis not present

## 2017-05-26 DIAGNOSIS — E1165 Type 2 diabetes mellitus with hyperglycemia: Secondary | ICD-10-CM | POA: Diagnosis not present

## 2017-05-26 DIAGNOSIS — M129 Arthropathy, unspecified: Secondary | ICD-10-CM | POA: Diagnosis not present

## 2017-05-26 DIAGNOSIS — Z5111 Encounter for antineoplastic chemotherapy: Secondary | ICD-10-CM | POA: Diagnosis not present

## 2017-05-26 DIAGNOSIS — R1310 Dysphagia, unspecified: Secondary | ICD-10-CM | POA: Diagnosis not present

## 2017-05-26 LAB — COMPREHENSIVE METABOLIC PANEL
ALT: 10 U/L — AB (ref 17–63)
AST: 17 U/L (ref 15–41)
Albumin: 3.3 g/dL — ABNORMAL LOW (ref 3.5–5.0)
Alkaline Phosphatase: 103 U/L (ref 38–126)
Anion gap: 11 (ref 5–15)
BUN: 13 mg/dL (ref 6–20)
CHLORIDE: 99 mmol/L — AB (ref 101–111)
CO2: 26 mmol/L (ref 22–32)
CREATININE: 0.95 mg/dL (ref 0.61–1.24)
Calcium: 8.8 mg/dL — ABNORMAL LOW (ref 8.9–10.3)
GFR calc Af Amer: >60 mL/min (ref >60)
Glucose, Bld: 372 mg/dL — ABNORMAL HIGH (ref 65–99)
Potassium: 3.7 mmol/L (ref 3.5–5.1)
Sodium: 136 mmol/L (ref 135–145)
Total Bilirubin: 0.8 mg/dL (ref 0.3–1.2)
Total Protein: 6.5 g/dL (ref 6.5–8.1)

## 2017-05-26 LAB — CBC WITH DIFFERENTIAL/PLATELET
Basophils Absolute: 0 10*3/uL (ref 0.0–0.1)
Basophils Relative: 0 %
EOS ABS: 0.3 10*3/uL (ref 0.0–0.7)
EOS PCT: 5 %
HCT: 43.7 % (ref 39.0–52.0)
Hemoglobin: 14.8 g/dL (ref 13.0–17.0)
LYMPHS ABS: 0.6 10*3/uL — AB (ref 0.7–4.0)
Lymphocytes Relative: 11 %
MCH: 29.1 pg (ref 26.0–34.0)
MCHC: 33.9 g/dL (ref 30.0–36.0)
MCV: 86 fL (ref 78.0–100.0)
MONOS PCT: 6 %
Monocytes Absolute: 0.4 10*3/uL (ref 0.1–1.0)
Neutro Abs: 4.4 10*3/uL (ref 1.7–7.7)
Neutrophils Relative %: 78 %
PLATELETS: 83 10*3/uL — AB (ref 150–400)
RBC: 5.08 MIL/uL (ref 4.22–5.81)
RDW: 16.2 % — AB (ref 11.5–15.5)
WBC: 5.7 10*3/uL (ref 4.0–10.5)

## 2017-05-26 MED ORDER — SODIUM CHLORIDE 0.9% FLUSH
10.0000 mL | Freq: Once | INTRAVENOUS | Status: AC
  Administered 2017-05-26: 10 mL via INTRAVENOUS

## 2017-05-26 MED ORDER — HEPARIN SOD (PORK) LOCK FLUSH 100 UNIT/ML IV SOLN
500.0000 [IU] | Freq: Once | INTRAVENOUS | Status: AC
  Administered 2017-05-26: 500 [IU] via INTRAVENOUS

## 2017-05-26 MED ORDER — HEPARIN SOD (PORK) LOCK FLUSH 100 UNIT/ML IV SOLN
INTRAVENOUS | Status: AC
  Filled 2017-05-26: qty 5

## 2017-05-26 NOTE — Patient Instructions (Signed)
[name] at [facility]  Discharge Instructions:  Return Friday for hydration, labs, and possible treatment.   _______________________________________________________________  Thank you for choosing [place] at [facility] to provide your oncology and hematology care.  To afford each patient quality time with our providers, please arrive at least 15 minutes before your scheduled appointment.  You need to re-schedule your appointment if you arrive 10 or more minutes late.  We strive to give you quality time with our providers, and arriving late affects you and other patients whose appointments are after yours.  Also, if you no show three or more times for appointments you may be dismissed from the clinic.  Again, thank you for choosing [place] at [place] hope is that these requests will allow you access to exceptional care and in a timely manner. _______________________________________________________________  If you have questions after your visit, please contact our office at [phone] between the hours of 8:30 a.m. and 5:00 p.m. Voicemails left after 4:30 p.m. will not be returned until the following business day. _______________________________________________________________  For prescription refill requests, have your pharmacy contact our office. _______________________________________________________________  Recommendations made by the consultant and any test results will be sent to your referring physician. _______________________________________________________________

## 2017-05-26 NOTE — Progress Notes (Signed)
To treatment area for chemotherapy.  Patient completed radiation therapy today.  Denied pain, difficulty swallowing, constipation, or diarrhea.  Denied neuropathy.  Stated prn nausea.  No s/s of distress noted.   Lab reviewed with Dr. Delton Coombes.  Platelets 83 today with treatment to be held today and bring back on Friday with hydration with lab recheck for possible treatment same day.  Reviewed with the patient and caregiver with understanding verbalized.  Port site clean and dry with port flush and no complaints of pain with flush.  Band aid applied.  VSS with discharge and left via wheelchair with caregiver.  No s/s of distress noted.

## 2017-05-27 ENCOUNTER — Encounter: Payer: Self-pay | Admitting: "Endocrinology

## 2017-05-27 ENCOUNTER — Ambulatory Visit (INDEPENDENT_AMBULATORY_CARE_PROVIDER_SITE_OTHER): Payer: Medicare Other | Admitting: "Endocrinology

## 2017-05-27 VITALS — BP 111/78 | HR 94 | Ht 64.0 in | Wt 180.0 lb

## 2017-05-27 DIAGNOSIS — E782 Mixed hyperlipidemia: Secondary | ICD-10-CM | POA: Diagnosis not present

## 2017-05-27 DIAGNOSIS — I25118 Atherosclerotic heart disease of native coronary artery with other forms of angina pectoris: Secondary | ICD-10-CM

## 2017-05-27 DIAGNOSIS — E1159 Type 2 diabetes mellitus with other circulatory complications: Secondary | ICD-10-CM

## 2017-05-27 DIAGNOSIS — E1165 Type 2 diabetes mellitus with hyperglycemia: Secondary | ICD-10-CM | POA: Insufficient documentation

## 2017-05-27 DIAGNOSIS — I10 Essential (primary) hypertension: Secondary | ICD-10-CM | POA: Diagnosis not present

## 2017-05-27 MED ORDER — GLUCOSE BLOOD VI STRP: ORAL_STRIP | 2 refills | Status: DC

## 2017-05-27 MED ORDER — ONETOUCH VERIO W/DEVICE KIT: 1.0000 | PACK | 0 refills | Status: AC | PRN

## 2017-05-27 NOTE — Progress Notes (Signed)
                                                              Endocrinology Consult Note       05/27/2017, 2:36 PM   Subjective:    Patient ID: [name], male    DOB: 06/04/1950.  [name] is being seen in consultation for management of currently uncontrolled symptomatic diabetes requested by  Hawkins, Edward, MD.   Past Medical History:  Diagnosis Date  . Arthritis   . Bipolar disorder (HCC)   . CAD (coronary artery disease) 08/16/2016  . Cellulitis and abscess of foot 03/05/2016  . Chronic combined systolic and diastolic heart failure (HCC)   . Diabetes mellitus without complication (HCC)    boderline  . GERD (gastroesophageal reflux disease)   . Heart murmur 1995  . History of kidney stones   . Hypercholesteremia   . Hypertension   . Kidney stones   . Myocardial infarction (HCC)   . OSA (obstructive sleep apnea)    no cpap, can't tolerate  . Schizoaffective disorder, bipolar type (HCC) 04/25/2016   Past Surgical History:  Procedure Laterality Date  . APPENDECTOMY    . BIOPSY  03/09/2017   Procedure: BIOPSY;  Surgeon: Fields, Sandi L, MD;  [facility];  Service: Endoscopy;;  gastric esophageal  . CHOLECYSTECTOMY N/A 10/20/2013   Procedure: LAPAROSCOPIC CHOLECYSTECTOMY;  Surgeon: Mark A Jenkins, MD;  Location: [facility];  Service: General;  Laterality: N/A;  . COLONOSCOPY WITH PROPOFOL N/A 03/09/2017   Procedure: COLONOSCOPY WITH PROPOFOL;  Surgeon: Fields, Sandi L, MD;  [facility];  Service: Endoscopy;  Laterality: N/A;  12:15pm  . ESOPHAGOGASTRODUODENOSCOPY (EGD) WITH PROPOFOL N/A 03/09/2017   Procedure: ESOPHAGOGASTRODUODENOSCOPY (EGD) WITH PROPOFOL;  Surgeon: Fields, Sandi L, MD;  [facility];  Service: Endoscopy;  Laterality: N/A;  . EUS N/A 04/08/2017   Procedure: UPPER ENDOSCOPIC ULTRASOUND (EUS) RADIAL;  Surgeon: Jacobs, Daniel P, MD;  Location: [facility];  Service: Endoscopy;  Laterality: N/A;  . HIP  SURGERY Left   . KNEE SURGERY Left   . POLYPECTOMY  03/09/2017   Procedure: POLYPECTOMY;  Surgeon: Fields, Sandi L, MD;  [facility];  Service: Endoscopy;;  colon   . PORTACATH PLACEMENT Left 04/07/2017   Procedure: INSERTION PORT-A-CATH LEFT SUBCLAVIAN;  Surgeon: Bridges, Lindsay C, MD;  Location: [facility];  Service: General;  Laterality: Left;  . SAVORY DILATION N/A 03/09/2017   Procedure: SAVORY DILATION;  Surgeon: Fields, Sandi L, MD;  [facility];  Service: Endoscopy;  Laterality: N/A;   Social History   Socioeconomic History  . Marital status: Divorced    Spouse name: Not on file  . Number of children: Not on file  . Years of education: Not on file  . Highest education level: Not on file  Occupational History  . Not on file  Social Needs  . Financial resource strain: Not on file  . Food insecurity:    Worry: Not on file    Inability: Not on file  . Transportation needs:    Medical: Not on file    Non-medical: Not on file  Tobacco Use  . Smoking status: Never Smoker  . Smokeless tobacco: Never Used  Substance and   Sexual Activity  . Alcohol use: No  . Drug use: No  . Sexual activity: Not Currently    Birth control/protection: Implant  Lifestyle  . Physical activity:    Days per week: Not on file    Minutes per session: Not on file  . Stress: Not on file  Relationships  . Social connections:    Talks on phone: Not on file    Gets together: Not on file    Attends religious service: Not on file    Active member of club or organization: Not on file    Attends meetings of clubs or organizations: Not on file    Relationship status: Not on file  Other Topics Concern  . Not on file  Social History Narrative  . Not on file   Outpatient Encounter Medications as of 05/27/2017  Medication Sig  . acetaminophen (TYLENOL) 325 MG tablet Take 650 mg by mouth every 6 (six) hours as needed.  . AMITIZA 24 MCG capsule Take 24 mcg by mouth 2 (two) times a  week.   Marland Kitchen aspirin EC 81 MG tablet Take 81 mg by mouth daily.  Marland Kitchen dexamethasone (DECADRON) 4 MG tablet Take 2 tablets (8 mg total) by mouth daily. Start the day after chemotherapy for 2 days.  Marland Kitchen dexlansoprazole (DEXILANT) 60 MG capsule Take 1 capsule (60 mg total) by mouth daily.  . furosemide (LASIX) 40 MG tablet Alternate 40 mg one day and 20 mg the next (Patient taking differently: Take 20-40 mg by mouth See admin instructions. Alternate 40 mg one day and 20 mg the next)  . linagliptin (TRADJENTA) 5 MG TABS tablet Take 5 mg by mouth daily.  Marland Kitchen OLANZapine (ZYPREXA) 5 MG tablet Take 5 mg by mouth 2 (two) times daily.  . ondansetron (ZOFRAN ODT) 8 MG disintegrating tablet Take 1 tablet (8 mg total) by mouth every 8 (eight) hours as needed for nausea or vomiting.  Marland Kitchen oxyCODONE (ROXICODONE) 5 MG immediate release tablet Take 1 tablet (5 mg total) by mouth every 4 (four) hours as needed.  . potassium chloride SA (K-DUR,KLOR-CON) 20 MEQ tablet Take 20 mEq by mouth daily.  . prochlorperazine (COMPAZINE) 10 MG tablet Take 1 tablet (10 mg total) by mouth every 6 (six) hours as needed (Nausea or vomiting).  . rosuvastatin (CRESTOR) 10 MG tablet Take 10 mg by mouth daily.  . sacubitril-valsartan (ENTRESTO) 49-51 MG Take 1 tablet by mouth 2 (two) times daily.  . Blood Glucose Monitoring Suppl (ONETOUCH VERIO) w/Device KIT 1 each by Does not apply route as needed.  Marland Kitchen CARBOPLATIN IV Inject into the vein.  Marland Kitchen glucose blood (ONETOUCH VERIO) test strip Use as instructed  . lidocaine (XYLOCAINE) 2 % solution   . lidocaine-prilocaine (EMLA) cream Apply a quarter size amount to affected area 1 hour prior to coming to chemotherapy. Do not rub in. Cover with plastic wrap.  . magic mouthwash w/lidocaine SOLN Take 5 mLs by mouth 4 (four) times daily as needed for mouth pain.  Marland Kitchen PACLitaxel (TAXOL IV) Inject into the vein.  . [DISCONTINUED] prochlorperazine (COMPAZINE) 10 MG tablet Take 1 tablet (10 mg total) by mouth once  for 1 dose.  . [DISCONTINUED] rosuvastatin (CRESTOR) 10 MG tablet Take 1 tablet (10 mg total) by mouth daily.   No facility-administered encounter medications on file as of 05/27/2017.     ALLERGIES: Allergies  Allergen Reactions  . Codeine Nausea And Vomiting  . Morphine And Related Nausea And Vomiting    VACCINATION  STATUS: Immunization History  Administered Date(s) Administered  . Pneumococcal Polysaccharide-23 03/06/2016    Diabetes  He presents for his initial diabetic visit. He has type 2 diabetes mellitus. Onset time: He was diagnosed at approximate age of 63 years. His disease course has been worsening. There are no hypoglycemic associated symptoms. Pertinent negatives for hypoglycemia include no confusion, headaches, pallor or seizures. Associated symptoms include polydipsia and polyuria. Pertinent negatives for diabetes include no chest pain, no fatigue, no polyphagia and no weakness. (Is only on Tradjenta 5 mg p.o. daily.  He was found to have persistently elevated blood glucose profile during his follow-up for oncology.  Does not have recent A1c.) There are no hypoglycemic complications. Diabetic complications include heart disease and nephropathy. Risk factors for coronary artery disease include diabetes mellitus, dyslipidemia, hypertension, male sex, family history and sedentary lifestyle. Current diabetic treatment includes oral agent (monotherapy). His weight is decreasing steadily. He is following a generally unhealthy diet. When asked about meal planning, he reported none. He has not had a previous visit with a dietitian. He rarely participates in exercise. (He is accompanied by 1 of his cousins, who is helping with his medications and information.)  Hyperlipidemia  This is a chronic problem. The current episode started more than 1 year ago. The problem is uncontrolled. Recent lipid tests were reviewed and are high. Exacerbating diseases include chronic renal disease and  diabetes. Pertinent negatives include no chest pain, myalgias or shortness of breath. Current antihyperlipidemic treatment includes statins. Risk factors for coronary artery disease include diabetes mellitus, dyslipidemia, hypertension, male sex and a sedentary lifestyle.  Hypertension  This is a chronic problem. The current episode started more than 1 year ago. The problem is controlled. Pertinent negatives include no chest pain, headaches, neck pain, palpitations or shortness of breath. Risk factors for coronary artery disease include dyslipidemia, diabetes mellitus, family history, male gender and sedentary lifestyle. Identifiable causes of hypertension include chronic renal disease.      Review of Systems  Constitutional: Negative for chills, fatigue, fever and unexpected weight change.  HENT: Negative for dental problem, mouth sores and trouble swallowing.   Eyes: Negative for visual disturbance.  Respiratory: Negative for cough, choking, chest tightness, shortness of breath and wheezing.   Cardiovascular: Negative for chest pain, palpitations and leg swelling.  Gastrointestinal: Negative for abdominal distention, abdominal pain, constipation, diarrhea, nausea and vomiting.  Endocrine: Positive for polydipsia and polyuria. Negative for polyphagia.  Genitourinary: Negative for dysuria, flank pain, hematuria and urgency.  Musculoskeletal: Negative for back pain, gait problem, myalgias and neck pain.  Skin: Negative for pallor, rash and wound.  Neurological: Negative for seizures, syncope, weakness, numbness and headaches.       Hard of hearing.  Psychiatric/Behavioral: Negative for confusion and dysphoric mood.    Objective:    BP 111/78   Pulse 94   Ht 5' 4" (1.626 m)   Wt 180 lb (81.6 kg)   BMI 30.90 kg/m   Wt Readings from Last 3 Encounters:  05/27/17 180 lb (81.6 kg)  05/26/17 180 lb (81.6 kg)  05/24/17 183 lb (83 kg)     Physical Exam  Constitutional: He is oriented to  person, place, and time. He appears well-developed and well-nourished. He is cooperative. No distress.  HENT:  Head: Normocephalic and atraumatic.  Eyes: EOM are normal.  Neck: Normal range of motion. Neck supple. No tracheal deviation present. No thyromegaly present.  Cardiovascular: Normal rate, S1 normal, S2 normal and normal heart sounds. Exam   reveals no gallop.  No murmur heard. Pulses:      Dorsalis pedis pulses are 1+ on the right side, and 1+ on the left side.       Posterior tibial pulses are 1+ on the right side, and 1+ on the left side.  Pulmonary/Chest: Breath sounds normal. No respiratory distress. He has no wheezes.  Abdominal: Soft. Bowel sounds are normal. He exhibits no distension. There is no tenderness. There is no guarding and no CVA tenderness.  Musculoskeletal: He exhibits no edema.       Right shoulder: He exhibits no swelling and no deformity.  Neurological: He is alert and oriented to person, place, and time. He has normal strength and normal reflexes. A sensory deficit is present. No cranial nerve deficit. Gait normal.  Patient is hard of hearing.  Skin: Skin is warm and dry. No rash noted. No cyanosis. Nails show no clubbing.  Psychiatric: He has a normal mood and affect. His speech is normal. Cognition and memory are normal.  Patient with significant cognitive deficit, he has positive effect.  His cousin is offering help.     CMP ( most recent) CMP     Component Value Date/Time   NA 136 05/26/2017 1005   K 3.7 05/26/2017 1005   CL 99 (L) 05/26/2017 1005   CO2 26 05/26/2017 1005   GLUCOSE 372 (H) 05/26/2017 1005   BUN 13 05/26/2017 1005   CREATININE 0.95 05/26/2017 1005   CREATININE 1.44 (H) 07/17/2016 0900   CALCIUM 8.8 (L) 05/26/2017 1005   PROT 6.5 05/26/2017 1005   ALBUMIN 3.3 (L) 05/26/2017 1005   AST 17 05/26/2017 1005   ALT 10 (L) 05/26/2017 1005   ALKPHOS 103 05/26/2017 1005   BILITOT 0.8 05/26/2017 1005   GFRNONAA >60 05/26/2017 1005    GFRAA >60 05/26/2017 1005     Diabetic Labs (most recent): Lab Results  Component Value Date   HGBA1C 8.0 (H) 08/16/2016    Lipid Panel     Component Value Date/Time   CHOL 182 08/17/2016 0517   TRIG 186 (H) 08/17/2016 0517   HDL 31 (L) 08/17/2016 0517   CHOLHDL 5.9 08/17/2016 0517   VLDL 37 08/17/2016 0517   LDLCALC 114 (H) 08/17/2016 0517      Lab Results  Component Value Date   TSH 1.045 08/16/2016   TSH 1.396 03/05/2016      Assessment & Plan:   1. DM type 2 causing vascular disease (HCC)  - [name] has currently uncontrolled symptomatic type 2 DM since 67 years of age,  with most recent A1c of 8 %. -No recent A1c, recent CMP is reviewed showing CKD.   -his diabetes is complicated by coronary artery disease, CKD and [name] remains at a high risk for more acute and chronic complications which include CAD, CVA, CKD, retinopathy, and neuropathy. These are all discussed in detail with the patient.  - I have counseled him on diet management and weight loss, by adopting a carbohydrate restricted/protein rich diet.  - Suggestion is made for him to avoid simple carbohydrates  from his diet including Cakes, Sweet Desserts, Ice Cream, Soda (diet and regular), Sweet Tea, Candies, Chips, Cookies, Store Bought Juices, Alcohol in Excess of  1-2 drinks a day, Artificial Sweeteners, and "Sugar-free" Products. This will help patient to have stable blood glucose profile and potentially avoid unintended weight gain.  - I encouraged him to switch to  unprocessed or minimally processed complex starch   and increased protein intake (animal or plant source), fruits, and vegetables.  - he is advised to stick to a routine mealtimes to eat 3 meals  a day and avoid unnecessary snacks ( to snack only to correct hypoglycemia).   - he will be scheduled with Penny Crumpton, RDN, CDE for individualized diabetes education.  - I have approached him with the following individualized plan  to manage diabetes and patient agrees:   -He does not have recent labs including A1c, will be sent for new set of labs.   -Considering his evident cognitive deficit, inadequate social support, treatment regimen should be simple as possible.   -He is asked to return in 1 week with a meter and logs monitoring 4 times a day-before meals and at bedtime.  I will send a prescription for new meter and testing supplies.  -Patient is encouraged to call clinic for blood glucose levels less than 70 or above 300 mg /dl. -If he has severely elevated blood glucose profile and A1c, will be considered for basal insulin therapy depending on his support and engagement. -In the meantime, I advised him to continue Tradjenta 5 mg p.o. every morning with breakfast.  -Patient is not a candidate for metformin, SGLT2 inhibitors due to CKD. - he will be considered for incretin therapy as appropriate next visit.  - Patient specific target  A1c;  LDL, HDL, Triglycerides, and  Waist Circumference were discussed in detail.  2) BP/HTN: His blood pressure is controlled to target. Continue current medications. 3) Lipids/HPL:   Controlled with LDL of 114,   Patient is advised to continue statins. 4)  Weight/Diet: CDE Consult will be initiated , exercise, and detailed carbohydrates information provided.  5) Chronic Care/Health Maintenance:  -he  Is on Statin medications and  is encouraged to continue to follow up with Ophthalmology, Dentist,  Podiatrist at least yearly or according to recommendations, and advised to  stay away from smoking. I have recommended yearly flu vaccine and pneumonia vaccination at least every 5 years; moderate intensity exercise for up to 150 minutes weekly; and  sleep for at least 7 hours a day.  - I advised patient to maintain close follow up with Hawkins, Edward, MD for primary care needs.  - Time spent with the patient: 45 minutes, of which >50% was spent in obtaining information about his  symptoms, reviewing his previous labs, evaluations, and treatments, counseling him about his currently uncontrolled complicated type 2 diabetes, hyperlipidemia, hypertension, and developing a plan to confirm the diagnosis and long term treatment as necessary.  Jann B Percival participated in the discussions, expressed understanding, and voiced agreement with the above plans.  All questions were answered to his satisfaction. he is encouraged to contact clinic should he have any questions or concerns prior to his return visit.  Follow up plan: - Return in about 1 week (around 06/03/2017) for meter, and logs, labs today.  Gebre , MD [facility] [address] , [place] [address] Phone: [phone]  Fax: [phone]    05/27/2017, 2:36 PM  This note was partially dictated with voice recognition software. Similar sounding words can be transcribed inadequately or may not  be corrected upon review.  

## 2017-05-27 NOTE — Patient Instructions (Signed)

## 2017-05-28 ENCOUNTER — Ambulatory Visit (HOSPITAL_COMMUNITY): Payer: Self-pay

## 2017-05-28 ENCOUNTER — Inpatient Hospital Stay (HOSPITAL_COMMUNITY): Payer: Medicare Other

## 2017-05-28 ENCOUNTER — Other Ambulatory Visit: Payer: Self-pay

## 2017-05-28 ENCOUNTER — Encounter (HOSPITAL_COMMUNITY): Payer: Self-pay

## 2017-05-28 VITALS — BP 108/55 | HR 87 | Temp 97.8°F | Resp 18 | Wt 181.6 lb

## 2017-05-28 DIAGNOSIS — E1159 Type 2 diabetes mellitus with other circulatory complications: Secondary | ICD-10-CM | POA: Diagnosis not present

## 2017-05-28 DIAGNOSIS — R1310 Dysphagia, unspecified: Secondary | ICD-10-CM | POA: Diagnosis not present

## 2017-05-28 DIAGNOSIS — C160 Malignant neoplasm of cardia: Secondary | ICD-10-CM

## 2017-05-28 DIAGNOSIS — Z5111 Encounter for antineoplastic chemotherapy: Secondary | ICD-10-CM | POA: Diagnosis not present

## 2017-05-28 DIAGNOSIS — C158 Malignant neoplasm of overlapping sites of esophagus: Secondary | ICD-10-CM

## 2017-05-28 DIAGNOSIS — E1165 Type 2 diabetes mellitus with hyperglycemia: Secondary | ICD-10-CM | POA: Diagnosis not present

## 2017-05-28 DIAGNOSIS — D696 Thrombocytopenia, unspecified: Secondary | ICD-10-CM | POA: Diagnosis not present

## 2017-05-28 DIAGNOSIS — R638 Other symptoms and signs concerning food and fluid intake: Secondary | ICD-10-CM

## 2017-05-28 DIAGNOSIS — M129 Arthropathy, unspecified: Secondary | ICD-10-CM | POA: Diagnosis not present

## 2017-05-28 DIAGNOSIS — C159 Malignant neoplasm of esophagus, unspecified: Secondary | ICD-10-CM

## 2017-05-28 LAB — CBC WITH DIFFERENTIAL/PLATELET
BASOS ABS: 0 10*3/uL (ref 0.0–0.1)
Basophils Relative: 0 %
EOS PCT: 6 %
Eosinophils Absolute: 0.3 10*3/uL (ref 0.0–0.7)
HCT: 41.6 % (ref 39.0–52.0)
Hemoglobin: 14 g/dL (ref 13.0–17.0)
LYMPHS PCT: 20 %
Lymphs Abs: 0.9 10*3/uL (ref 0.7–4.0)
MCH: 29.2 pg (ref 26.0–34.0)
MCHC: 33.7 g/dL (ref 30.0–36.0)
MCV: 86.7 fL (ref 78.0–100.0)
MONO ABS: 0 10*3/uL — AB (ref 0.1–1.0)
Monocytes Relative: 1 %
Neutro Abs: 3.4 10*3/uL (ref 1.7–7.7)
Neutrophils Relative %: 73 %
PLATELETS: 88 10*3/uL — AB (ref 150–400)
RBC: 4.8 MIL/uL (ref 4.22–5.81)
RDW: 16.4 % — AB (ref 11.5–15.5)
WBC: 4.7 10*3/uL (ref 4.0–10.5)

## 2017-05-28 LAB — COMPREHENSIVE METABOLIC PANEL
ALK PHOS: 98 U/L (ref 38–126)
ALT: 10 U/L — ABNORMAL LOW (ref 17–63)
AST: 18 U/L (ref 15–41)
Albumin: 3.1 g/dL — ABNORMAL LOW (ref 3.5–5.0)
Anion gap: 11 (ref 5–15)
BILIRUBIN TOTAL: 0.5 mg/dL (ref 0.3–1.2)
BUN: 12 mg/dL (ref 6–20)
CALCIUM: 8.5 mg/dL — AB (ref 8.9–10.3)
CO2: 27 mmol/L (ref 22–32)
Chloride: 96 mmol/L — ABNORMAL LOW (ref 101–111)
Creatinine, Ser: 1.05 mg/dL (ref 0.61–1.24)
GFR calc Af Amer: >60 mL/min (ref >60)
GFR calc non Af Amer: >60 mL/min (ref >60)
GLUCOSE: 353 mg/dL — AB (ref 65–99)
Potassium: 3.9 mmol/L (ref 3.5–5.1)
Sodium: 134 mmol/L — ABNORMAL LOW (ref 135–145)
TOTAL PROTEIN: 6.3 g/dL — AB (ref 6.5–8.1)

## 2017-05-28 MED ORDER — GLUCOSE BLOOD VI STRP: ORAL_STRIP | 2 refills | Status: DC

## 2017-05-28 MED ORDER — SODIUM CHLORIDE 0.9% FLUSH
10.0000 mL | INTRAVENOUS | Status: DC | PRN
  Administered 2017-05-28: 10 mL
  Filled 2017-05-28: qty 10

## 2017-05-28 MED ORDER — SODIUM CHLORIDE 0.9 % IV SOLN
Freq: Once | INTRAVENOUS | Status: AC
  Administered 2017-05-28: 10:00:00 via INTRAVENOUS

## 2017-05-28 MED ORDER — HEPARIN SOD (PORK) LOCK FLUSH 100 UNIT/ML IV SOLN
500.0000 [IU] | Freq: Once | INTRAVENOUS | Status: AC | PRN
  Administered 2017-05-28: 500 [IU]

## 2017-05-28 NOTE — Patient Instructions (Signed)
[place] at [facility] Discharge Instructions  Received 3 hours of fluids with NS today. Chemo tx held. Follow-up as scheduled. Call clinic for any questions or concerns   Thank you for choosing [name] at [facility] to provide your oncology and hematology care.  To afford each patient quality time with our provider, please arrive at least 15 minutes before your scheduled appointment time.   If you have a lab appointment with the [facility] please come in thru the  Main Entrance and check in at the main information desk  You need to re-schedule your appointment should you arrive 10 or more minutes late.  We strive to give you quality time with our providers, and arriving late affects you and other patients whose appointments are after yours.  Also, if you no show three or more times for appointments you may be dismissed from the clinic at the providers discretion.     Again, thank you for choosing [facility].  Our hope is that these requests will decrease the amount of time that you wait before being seen by our physicians.       _____________________________________________________________  Should you have questions after your visit to [facility], please contact our office at [phone] between the hours of 8:30 a.m. and 4:30 p.m.  Voicemails left after 4:30 p.m. will not be returned until the following business day.  For prescription refill requests, have your pharmacy contact our office.       Resources For Cancer Patients and their Caregivers ? American Cancer Society: Can assist with transportation, wigs, general needs, runs Look Good Feel Better.        5670641119 ? Cancer Care: Provides financial assistance, online support groups, medication/co-pay assistance.  1-800-813-HOPE (561)300-0829) ? [facility] Assists [place] cancer patients and their families through  emotional , educational and financial support.  (636)052-6793 ? [place] DSS Where to apply for food stamps, Medicaid and utility assistance. (478) 788-1179 ? [facility]: Transportation to medical appointments. 780 477 7862 ? Social Security Administration: May apply for disability if have a Stage IV cancer. 9852556867 236-831-8589 ? [employer], Disability and Transit Services: Assists with nutrition, care and transit needs. [place] Support Programs:   > Cancer Support Group  2nd Tuesday of the month 1pm-2pm, Journey Room   > Creative Journey  3rd Tuesday of the month 1130am-1pm, Journey Room

## 2017-05-28 NOTE — Progress Notes (Signed)
Nutrition Follow-up:  Patient with esophageal cancer extending to fundus.  Patient receiving radiation therapy and chemotherapy has been on hold due to low platelets.  Met with patient during infusion of fluids with friend at chairside.  Patient reports ate 1/4 of pancake for breakfast, few bites of sausage and drank sweet tea.  Reports ate pudding and ice cream for lunch and dinner.  Reports drinking mostly 2-3 glucerna shakes (sometimes ensure plus/boost).  Thinks he may be little lactose intolerant.  Reports diarrhea 1 time every other day.  Drinking regular coke and eating peanut butter nabs during visit.    Reviewed note from endocrinology.  Medications: reviewed  Labs: reviewed  Anthropometrics:   Weight 181 lb 9.6 oz today in clinic decreased from 185 lb on 4/3.  UBW of 190 lb (10/2016)   NUTRITION DIAGNOSIS: Predicted suboptimal nutrient intake continues   MALNUTRITION DIAGNOSIS: continue to monitor   INTERVENTION:  Patient has not tried soups or frozen meals as RD had suggested on past visits.   Patient not willing to try diet soda, unsweet tea, not adding sugar to oatmeal, etc.   Encouraged patient to continue to drink oral nutrition supplements to help maintain nutrition during treatment.  Friend/driver wanted to speak with RD outside of room.  Reports that patient is bipolar and telling RD what I want to hear.  Reports that POA Langley Gauss keeps him stocked in easy to prepare meals like soup, microwave meals, etc. But patient will not eat them.  Reports that patient will not try diet drinks or change eating pattern.   RD with no further recommendations at this time. RD has met with patient on numerous visits in clinic to help with improving blood glucose and overall nutrition during treatment.  Patient not willing to adhere to recommendations.   MONITORING, EVALUATION, GOAL: weight trends, intake   NEXT VISIT: as needed  Markia Kyer B. Zenia Resides, [place], [place] Registered  Dietitian [phone] (pager)

## 2017-05-28 NOTE — Progress Notes (Signed)
[name] tolerated hydration well without complaints or incident. Labs reviewed with Dr. Delton Coombes and chemo tx held today due to  platelets of 88  VSS Pt discharged via wheelchair in satisfactory condition accompanied by a friend

## 2017-05-29 LAB — COMPLETE METABOLIC PANEL WITH GFR
AG RATIO: 1.4 (calc) (ref 1.0–2.5)
ALT: 6 U/L — AB (ref 9–46)
AST: 12 U/L (ref 10–35)
Albumin: 3.5 g/dL — ABNORMAL LOW (ref 3.6–5.1)
Alkaline phosphatase (APISO): 105 U/L (ref 40–115)
BILIRUBIN TOTAL: 0.4 mg/dL (ref 0.2–1.2)
BUN: 12 mg/dL (ref 7–25)
CALCIUM: 8.3 mg/dL — AB (ref 8.6–10.3)
CHLORIDE: 100 mmol/L (ref 98–110)
CO2: 31 mmol/L (ref 20–32)
Creat: 1.13 mg/dL (ref 0.70–1.25)
GFR, EST NON AFRICAN AMERICAN: 67 mL/min/{1.73_m2} (ref >=60)
GFR, Est African American: 78 mL/min/{1.73_m2} (ref >=60)
GLUCOSE: 323 mg/dL — AB (ref 65–139)
Globulin: 2.5 g/dL (calc) (ref 1.9–3.7)
Potassium: 3.7 mmol/L (ref 3.5–5.3)
Sodium: 137 mmol/L (ref 135–146)
Total Protein: 6 g/dL — ABNORMAL LOW (ref 6.1–8.1)

## 2017-05-29 LAB — T4, FREE: Free T4: 1.3 ng/dL (ref 0.8–1.8)

## 2017-05-29 LAB — MICROALBUMIN / CREATININE URINE RATIO
Creatinine, Urine: 33 mg/dL (ref 20–320)
Microalb Creat Ratio: 221 mcg/mg creat — ABNORMAL HIGH (ref <30)
Microalb, Ur: 7.3 mg/dL

## 2017-05-29 LAB — HEMOGLOBIN A1C
EAG (MMOL/L): 15.2 (calc)
Hgb A1c MFr Bld: 11.2 % of total Hgb — ABNORMAL HIGH (ref <5.7)
MEAN PLASMA GLUCOSE: 275 (calc)

## 2017-05-29 LAB — TSH: TSH: 0.65 mIU/L (ref 0.40–4.50)

## 2017-05-31 ENCOUNTER — Inpatient Hospital Stay (HOSPITAL_COMMUNITY): Payer: Medicare Other

## 2017-05-31 ENCOUNTER — Encounter (HOSPITAL_COMMUNITY): Payer: Self-pay

## 2017-05-31 ENCOUNTER — Other Ambulatory Visit: Payer: Self-pay

## 2017-05-31 VITALS — BP 115/72 | HR 54 | Temp 97.7°F | Resp 16

## 2017-05-31 DIAGNOSIS — R1310 Dysphagia, unspecified: Secondary | ICD-10-CM | POA: Diagnosis not present

## 2017-05-31 DIAGNOSIS — Z5111 Encounter for antineoplastic chemotherapy: Secondary | ICD-10-CM | POA: Diagnosis not present

## 2017-05-31 DIAGNOSIS — R638 Other symptoms and signs concerning food and fluid intake: Secondary | ICD-10-CM

## 2017-05-31 DIAGNOSIS — C160 Malignant neoplasm of cardia: Secondary | ICD-10-CM

## 2017-05-31 DIAGNOSIS — R197 Diarrhea, unspecified: Secondary | ICD-10-CM

## 2017-05-31 DIAGNOSIS — D696 Thrombocytopenia, unspecified: Secondary | ICD-10-CM | POA: Diagnosis not present

## 2017-05-31 DIAGNOSIS — M129 Arthropathy, unspecified: Secondary | ICD-10-CM | POA: Diagnosis not present

## 2017-05-31 DIAGNOSIS — E1165 Type 2 diabetes mellitus with hyperglycemia: Secondary | ICD-10-CM | POA: Diagnosis not present

## 2017-05-31 DIAGNOSIS — C159 Malignant neoplasm of esophagus, unspecified: Secondary | ICD-10-CM

## 2017-05-31 LAB — BASIC METABOLIC PANEL
Anion gap: 9 (ref 5–15)
BUN: 9 mg/dL (ref 6–20)
CALCIUM: 7.7 mg/dL — AB (ref 8.9–10.3)
CO2: 22 mmol/L (ref 22–32)
Chloride: 102 mmol/L (ref 101–111)
Creatinine, Ser: 0.94 mg/dL (ref 0.61–1.24)
GFR calc Af Amer: >60 mL/min (ref >60)
GLUCOSE: 265 mg/dL — AB (ref 65–99)
POTASSIUM: 3.3 mmol/L — AB (ref 3.5–5.1)
Sodium: 133 mmol/L — ABNORMAL LOW (ref 135–145)

## 2017-05-31 MED ORDER — POTASSIUM CHLORIDE CRYS ER 20 MEQ PO TBCR
EXTENDED_RELEASE_TABLET | ORAL | Status: AC
  Filled 2017-05-31: qty 1

## 2017-05-31 MED ORDER — SODIUM CHLORIDE 0.9 % IV SOLN
Freq: Once | INTRAVENOUS | Status: AC
  Administered 2017-05-31: 11:00:00 via INTRAVENOUS
  Filled 2017-05-31: qty 4

## 2017-05-31 MED ORDER — HEPARIN SOD (PORK) LOCK FLUSH 100 UNIT/ML IV SOLN
500.0000 [IU] | Freq: Once | INTRAVENOUS | Status: AC | PRN
  Administered 2017-05-31: 500 [IU]

## 2017-05-31 MED ORDER — LOPERAMIDE HCL 2 MG PO CAPS
2.0000 mg | ORAL_CAPSULE | Freq: Once | ORAL | Status: AC
  Administered 2017-05-31: 2 mg via ORAL
  Filled 2017-05-31: qty 1

## 2017-05-31 MED ORDER — LOPERAMIDE HCL 2 MG PO CAPS
ORAL_CAPSULE | ORAL | Status: AC
  Filled 2017-05-31: qty 1

## 2017-05-31 MED ORDER — SODIUM CHLORIDE 0.9% FLUSH
10.0000 mL | INTRAVENOUS | Status: DC | PRN
  Administered 2017-05-31: 10 mL
  Filled 2017-05-31: qty 10

## 2017-05-31 MED ORDER — POTASSIUM CHLORIDE CRYS ER 20 MEQ PO TBCR
20.0000 meq | EXTENDED_RELEASE_TABLET | Freq: Once | ORAL | Status: AC
  Administered 2017-05-31: 20 meq via ORAL

## 2017-05-31 MED ORDER — SODIUM CHLORIDE 0.9 % IV SOLN
Freq: Once | INTRAVENOUS | Status: AC
  Administered 2017-05-31: 10:00:00 via INTRAVENOUS

## 2017-05-31 NOTE — Progress Notes (Signed)
Patient presented today for hydration fluids. Patient stated he has been nauseated this morning, vomiting phlegm, has not eaten this morning or took he medications.   Patient started having diarrhea this morning when he got to clinic. Will give immodium per orders. Labs drawn per orders.   Potassium given per orders.Labs reviewed with MD prior to patient leaving clinic. Was unable to collect stool specimen and MD is aware.    Vitals stable and discharged home from clinic ambulatory. Follow up as scheduled.

## 2017-06-01 ENCOUNTER — Telehealth (HOSPITAL_COMMUNITY): Payer: Self-pay

## 2017-06-01 NOTE — Telephone Encounter (Signed)
Following up to check on patient from his visit yesterday. Patient states he has not had any diarrhea or vomiting since he left here yesterday. Patient states that he cannot each much cause he feels like he will vomit if he continues to eat. He is concerned about this. He is drinking glucerna supplements. I explained in detail for him to take his zofran and compazine and how to take it. Instructed him to take zofran now and try to eat small amounts of food at a time. I will call patient back tomorrow to follow up on him.

## 2017-06-01 NOTE — Patient Instructions (Signed)
[place] at [facility] Discharge Instructions  Hydration fluids given today. Immodium and nausea medications given per orders.  Labs drawn and will check on patient tomorrow. Follow up as scheduled.   Thank you for choosing [place] at [facility] to provide your oncology and hematology care.  To afford each patient quality time with our provider, please arrive at least 15 minutes before your scheduled appointment time.   If you have a lab appointment with the [place] please come in thru the  Main Entrance and check in at the main information desk  You need to re-schedule your appointment should you arrive 10 or more minutes late.  We strive to give you quality time with our providers, and arriving late affects you and other patients whose appointments are after yours.  Also, if you no show three or more times for appointments you may be dismissed from the clinic at the providers discretion.     Again, thank you for choosing [facility].  Our hope is that these requests will decrease the amount of time that you wait before being seen by our physicians.       _____________________________________________________________  Should you have questions after your visit to [facility], please contact our office at [phone] between the hours of 8:30 a.m. and 4:30 p.m.  Voicemails left after 4:30 p.m. will not be returned until the following business day.  For prescription refill requests, have your pharmacy contact our office.       Resources For Cancer Patients and their Caregivers ? American Cancer Society: Can assist with transportation, wigs, general needs, runs Look Good Feel Better.        405-453-2860 ? Cancer Care: Provides financial assistance, online support groups, medication/co-pay assistance.  1-800-813-HOPE 954-495-2287) ? [place] Assists [place] cancer patients and  their families through emotional , educational and financial support.  2185693192 ? [place] DSS Where to apply for food stamps, Medicaid and utility assistance. 438-579-7148 ? RCATS: Transportation to medical appointments. 6462736226 ? Social Security Administration: May apply for disability if have a Stage IV cancer. 518-412-3265 (918) 747-5105 ? [employer], Disability and Transit Services: Assists with nutrition, care and transit needs. [place] Support Programs:   > Cancer Support Group  2nd Tuesday of the month 1pm-2pm, Journey Room   > Creative Journey  3rd Tuesday of the month 1130am-1pm, Journey Room

## 2017-06-02 NOTE — Progress Notes (Signed)
  Radiation Oncology         [phone] ________________________________  Name: [name] MRN: [id number]  Date: 05/26/2017  DOB: 09/20/1950  End of Treatment Note  Diagnosis:   67 y.o. 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  Beams/energy:   IMRT / 6X-FFF Photon  Narrative: The patient tolerated radiation treatment relatively well with concurrent chemotherapy.  He experienced moderate fatigue, nausea/vomiting, and weight loss. He also reported having some dysphasia but maintained the ability to eat solid foods.  Plan: The patient has completed radiation treatment. The patient will return to radiation oncology clinic for routine followup in one month. I advised the patient to call or return sooner if they have any questions or concerns related to their recovery or treatment. ________________________________  Jodelle Gross, MD, PhD  This document serves as a record of services personally performed by Kyung Rudd, MD. It was created on his behalf by Rae Lips, a trained medical scribe. The creation of this record is based on the scribe's personal observations and the provider's statements to them. This document has been checked and approved by the attending provider.

## 2017-06-03 ENCOUNTER — Encounter (HOSPITAL_COMMUNITY): Payer: Self-pay

## 2017-06-03 ENCOUNTER — Other Ambulatory Visit: Payer: Self-pay

## 2017-06-03 ENCOUNTER — Inpatient Hospital Stay (HOSPITAL_COMMUNITY): Payer: Medicare Other

## 2017-06-03 ENCOUNTER — Ambulatory Visit (HOSPITAL_COMMUNITY): Payer: Self-pay

## 2017-06-03 DIAGNOSIS — C160 Malignant neoplasm of cardia: Secondary | ICD-10-CM | POA: Diagnosis not present

## 2017-06-03 DIAGNOSIS — M129 Arthropathy, unspecified: Secondary | ICD-10-CM | POA: Diagnosis not present

## 2017-06-03 DIAGNOSIS — D696 Thrombocytopenia, unspecified: Secondary | ICD-10-CM | POA: Diagnosis not present

## 2017-06-03 DIAGNOSIS — C158 Malignant neoplasm of overlapping sites of esophagus: Secondary | ICD-10-CM

## 2017-06-03 DIAGNOSIS — E1165 Type 2 diabetes mellitus with hyperglycemia: Secondary | ICD-10-CM | POA: Diagnosis not present

## 2017-06-03 DIAGNOSIS — R1310 Dysphagia, unspecified: Secondary | ICD-10-CM | POA: Diagnosis not present

## 2017-06-03 DIAGNOSIS — Z5111 Encounter for antineoplastic chemotherapy: Secondary | ICD-10-CM | POA: Diagnosis not present

## 2017-06-03 LAB — CBC WITH DIFFERENTIAL/PLATELET
BASOS ABS: 0 10*3/uL (ref 0.0–0.1)
BASOS PCT: 0 %
EOS ABS: 0.3 10*3/uL (ref 0.0–0.7)
EOS PCT: 6 %
HCT: 39.1 % (ref 39.0–52.0)
Hemoglobin: 13.7 g/dL (ref 13.0–17.0)
LYMPHS PCT: 12 %
Lymphs Abs: 0.6 10*3/uL — ABNORMAL LOW (ref 0.7–4.0)
MCH: 29.8 pg (ref 26.0–34.0)
MCHC: 35 g/dL (ref 30.0–36.0)
MCV: 85.2 fL (ref 78.0–100.0)
MONO ABS: 1 10*3/uL (ref 0.1–1.0)
Monocytes Relative: 18 %
Neutro Abs: 3.3 10*3/uL (ref 1.7–7.7)
Neutrophils Relative %: 64 %
PLATELETS: 90 10*3/uL — AB (ref 150–400)
RBC: 4.59 MIL/uL (ref 4.22–5.81)
RDW: 16.3 % — AB (ref 11.5–15.5)
WBC: 5.2 10*3/uL (ref 4.0–10.5)

## 2017-06-03 LAB — COMPREHENSIVE METABOLIC PANEL
ALBUMIN: 2.9 g/dL — AB (ref 3.5–5.0)
ALK PHOS: 80 U/L (ref 38–126)
ALT: 12 U/L — ABNORMAL LOW (ref 17–63)
AST: 20 U/L (ref 15–41)
Anion gap: 11 (ref 5–15)
BILIRUBIN TOTAL: 0.8 mg/dL (ref 0.3–1.2)
BUN: 12 mg/dL (ref 6–20)
CALCIUM: 8.3 mg/dL — AB (ref 8.9–10.3)
CO2: 24 mmol/L (ref 22–32)
CREATININE: 1.19 mg/dL (ref 0.61–1.24)
Chloride: 95 mmol/L — ABNORMAL LOW (ref 101–111)
GFR calc Af Amer: >60 mL/min (ref >60)
GFR calc non Af Amer: >60 mL/min (ref >60)
GLUCOSE: 377 mg/dL — AB (ref 65–99)
POTASSIUM: 3.6 mmol/L (ref 3.5–5.1)
Sodium: 130 mmol/L — ABNORMAL LOW (ref 135–145)
TOTAL PROTEIN: 6.2 g/dL — AB (ref 6.5–8.1)

## 2017-06-03 MED ORDER — SODIUM CHLORIDE 0.9% FLUSH
10.0000 mL | Freq: Once | INTRAVENOUS | Status: AC
  Administered 2017-06-03: 10 mL via INTRAVENOUS

## 2017-06-03 MED ORDER — HEPARIN SOD (PORK) LOCK FLUSH 100 UNIT/ML IV SOLN
500.0000 [IU] | Freq: Once | INTRAVENOUS | Status: AC
  Administered 2017-06-03: 500 [IU] via INTRAVENOUS
  Filled 2017-06-03: qty 5

## 2017-06-03 NOTE — Progress Notes (Signed)
Labs reviewed with MD today. Patient stated it is hard for him to swallow and eat a lot at one time. Patient is concerned about this. No diarrhea or nausea at this time. Per MD, will hold treatment for now and follow up with MD in 2-3 weeks, will also discontinue hydration fluids for now per MD. Instructed patient to call if he has any concerns or issues. Encouraged patient to drink at least 3 supplemental drinks per day per MD.    Vitals stable and discharged home from clinic via wheelchair. Follow up as scheduled.

## 2017-06-03 NOTE — Patient Instructions (Signed)
[place] at [facility] Discharge Instructions  Holding treatment for now.  Follow up as scheduled.  Thank you for choosing [place] at [facility] to provide your oncology and hematology care.  To afford each patient quality time with our provider, please arrive at least 15 minutes before your scheduled appointment time.   If you have a lab appointment with the [place] please come in thru the  Main Entrance and check in at the main information desk  You need to re-schedule your appointment should you arrive 10 or more minutes late.  We strive to give you quality time with our providers, and arriving late affects you and other patients whose appointments are after yours.  Also, if you no show three or more times for appointments you may be dismissed from the clinic at the providers discretion.     Again, thank you for choosing [facility].  Our hope is that these requests will decrease the amount of time that you wait before being seen by our physicians.       _____________________________________________________________  Should you have questions after your visit to [facility], please contact our office at [phone] between the hours of 8:30 a.m. and 4:30 p.m.  Voicemails left after 4:30 p.m. will not be returned until the following business day.  For prescription refill requests, have your pharmacy contact our office.       Resources For Cancer Patients and their Caregivers ? American Cancer Society: Can assist with transportation, wigs, general needs, runs Look Good Feel Better.        (734)301-9583 ? Cancer Care: Provides financial assistance, online support groups, medication/co-pay assistance.  1-800-813-HOPE 516-471-2651) ? [place] Assists [place] cancer patients and their families through emotional , educational and financial support.  249-418-7931 ? [place]  DSS Where to apply for food stamps, Medicaid and utility assistance. (715) 733-9070 ? RCATS: Transportation to medical appointments. (613) 082-1511 ? Social Security Administration: May apply for disability if have a Stage IV cancer. 972-099-2731 832 806 5422 ? [employer], Disability and Transit Services: Assists with nutrition, care and transit needs. [place] Support Programs:   > Cancer Support Group  2nd Tuesday of the month 1pm-2pm, Journey Room   > Creative Journey  3rd Tuesday of the month 1130am-1pm, Journey Room

## 2017-06-07 ENCOUNTER — Encounter: Payer: Self-pay | Admitting: "Endocrinology

## 2017-06-07 ENCOUNTER — Ambulatory Visit (INDEPENDENT_AMBULATORY_CARE_PROVIDER_SITE_OTHER): Payer: Medicare Other | Admitting: "Endocrinology

## 2017-06-07 VITALS — BP 106/70 | Ht 64.0 in | Wt 176.0 lb

## 2017-06-07 DIAGNOSIS — E782 Mixed hyperlipidemia: Secondary | ICD-10-CM | POA: Diagnosis not present

## 2017-06-07 DIAGNOSIS — E1159 Type 2 diabetes mellitus with other circulatory complications: Secondary | ICD-10-CM | POA: Diagnosis not present

## 2017-06-07 DIAGNOSIS — I10 Essential (primary) hypertension: Secondary | ICD-10-CM | POA: Diagnosis not present

## 2017-06-07 DIAGNOSIS — I25118 Atherosclerotic heart disease of native coronary artery with other forms of angina pectoris: Secondary | ICD-10-CM | POA: Diagnosis not present

## 2017-06-07 NOTE — Patient Instructions (Signed)

## 2017-06-07 NOTE — Progress Notes (Signed)
Endocrinology Consult Note       06/07/2017, 4:36 PM   Subjective:    Patient ID: [name], male    DOB: [birth year].  [name] is being seen in consultation for management of currently uncontrolled symptomatic diabetes requested by  Sinda Du, MD.   Past Medical History:  Diagnosis Date  . Arthritis   . Bipolar disorder ([place])   . CAD (coronary artery disease) 08/16/2016  . Cellulitis and abscess of foot 03/05/2016  . Chronic combined systolic and diastolic heart failure ([place])   . Diabetes mellitus without complication ([place])    boderline  . GERD (gastroesophageal reflux disease)   . Heart murmur 1995  . History of kidney stones   . Hypercholesteremia   . Hypertension   . Kidney stones   . Myocardial infarction ([place])   . OSA (obstructive sleep apnea)    no cpap, can't tolerate  . Schizoaffective disorder, bipolar type ([place]) 04/25/2016   Past Surgical History:  Procedure Laterality Date  . APPENDECTOMY    . BIOPSY  03/09/2017   Procedure: BIOPSY;  Surgeon: Danie Binder, MD;  Location: [facility] ENDO SUITE;  Service: Endoscopy;;  gastric esophageal  . CHOLECYSTECTOMY N/A 10/20/2013   Procedure: LAPAROSCOPIC CHOLECYSTECTOMY;  Surgeon: [name], MD;  Location: [facility] ORS;  Service: General;  Laterality: N/A;  . COLONOSCOPY WITH PROPOFOL N/A 03/09/2017   Procedure: COLONOSCOPY WITH PROPOFOL;  Surgeon: Danie Binder, MD;  Location: [facility] ENDO SU[facility];  Service: Endoscopy;  Laterality: N/A;  12:15pm  . ESOPHAGOGASTRODUODENOSCOPY (EGD) WITH PROPOFOL N/A 03/09/2017   Procedure: ESOPHAGOGASTRODUODENOSCOPY (EGD) WITH PROPOFOL;  Surgeon: Danie Binder, MD;  Location: [facility];  Service: Endoscopy;  Laterality: N/A;  . EUS N/A 04/08/2017   Procedure: UPPER ENDOSCOPIC ULTRASOUND (EUS) RADIAL;  Surgeon: Milus Banister, MD;  Location: [facility] ENDOSCOPY;  Service: Endoscopy;  Laterality: N/A;  . HIP  SURGERY Left   . KNEE SURGERY Left   . POLYPECTOMY  03/09/2017   Procedure: POLYPECTOMY;  Surgeon: Danie Binder, MD;  Location: [facility];  Service: Endoscopy;;  colon   . PORTACATH PLACEMENT Left 04/07/2017   Procedure: INSERTION PORT-A-CATH LEFT SUBCLAVIAN;  Surgeon: Virl Cagey, MD;  Location: [facility] ORS;  Service: General;  Laterality: Left;  . SAVORY DILATION N/A 03/09/2017   Procedure: SAVORY DILATION;  Surgeon: Danie Binder, MD;  Location: [facility];  Service: Endoscopy;  Laterality: N/A;   Social History   Socioeconomic History  . Marital status: Divorced    Spouse name: Not on file  . Number of children: Not on file  . Years of education: Not on file  . Highest education level: Not on file  Occupational History  . Not on file  Social Needs  . Financial resource strain: Not on file  . Food insecurity:    Worry: Not on file    Inability: Not on file  . Transportation needs:    Medical: Not on file    Non-medical: Not on file  Tobacco Use  . Smoking status: Never Smoker  . Smokeless tobacco: Never Used  Substance and  Sexual Activity  . Alcohol use: No  . Drug use: No  . Sexual activity: Not Currently    Birth control/protection: Implant  Lifestyle  . Physical activity:    Days per week: Not on file    Minutes per session: Not on file  . Stress: Not on file  Relationships  . Social connections:    Talks on phone: Not on file    Gets together: Not on file    Attends religious service: Not on file    Active member of club or organization: Not on file    Attends meetings of clubs or organizations: Not on file    Relationship status: Not on file  Other Topics Concern  . Not on file  Social History Narrative  . Not on file   Outpatient Encounter Medications as of 06/07/2017  Medication Sig  . acetaminophen (TYLENOL) 325 MG tablet Take 650 mg by mouth every 6 (six) hours as needed.  . AMITIZA 24 MCG capsule Take 24 mcg by mouth 2 (two) times a  week.   Marland Kitchen aspirin EC 81 MG tablet Take 81 mg by mouth daily.  . Blood Glucose Monitoring Suppl (ONETOUCH VERIO) w/Device KIT 1 each by Does not apply route as needed.  Marland Kitchen CARBOPLATIN IV Inject into the vein.  Mar[place] Kitchen dexamethasone (DECADRON) 4 MG tablet Take 2 tablets (8 mg total) by mouth daily. Start the day after chemotherapy for 2 days.  Marland Kitchen dexlansoprazole (DEXILANT) 60 MG capsule Take 1 capsule (60 mg total) by mouth daily.  . furosemide (LASIX) 40 MG tablet Alternate 40 mg one day and 20 mg the next (Patient taking differently: Take 20-40 mg by mouth See admin instructions. Alternate 40 mg one day and 20 mg the next)  . glucose blood (ONETOUCH VERIO) test strip Use as instructed 4 x daily. E11.65  . lidocaine (XYLOCAINE) 2 % solution   . lidocaine-prilocaine (EMLA) cream Apply a quarter size amount to affected area 1 hour prior to coming to chemotherapy. Do not rub in. Cover with plastic wrap. (Patient not taking: Reported on 06/03/2017)  . linagliptin (TRADJENTA) 5 MG TABS tablet Take 5 mg by mouth daily.  . magic mouthwash w/lidocaine SOLN Take 5 mLs by mouth 4 (four) times daily as needed for mouth pain. (Patient not taking: Reported on 06/03/2017)  . OLANZapine (ZYPREXA) 5 MG tablet Take 5 mg by mouth 2 (two) times daily.  . ondansetron (ZOFRAN ODT) 8 MG disintegrating tablet Take 1 tablet (8 mg total) by mouth every 8 (eight) hours as needed for nausea or vomiting.  Marland Kitchen oxyCODONE (ROXICODONE) 5 MG immediate release tablet Take 1 tablet (5 mg total) by mouth every 4 (four) hours as needed.  Marland Kitchen PACLitaxel (TAXOL IV) Inject into the vein.  . potassium chloride SA (K-DUR,KLOR-CON) 20 MEQ tablet Take 20 mEq by mouth daily.  . prochlorperazine (COMPAZINE) 10 MG tablet Take 1 tablet (10 mg total) by mouth every 6 (six) hours as needed (Nausea or vomiting).  . rosuvastatin (CRESTOR) 10 MG tablet Take 10 mg by mouth daily.  . sacubitril-valsartan (ENTRESTO) 49-51 MG Take 1 tablet by mouth 2 (two) times  daily.   No facility-administered encounter medications on file as of 06/07/2017.     ALLERGIES: Allergies  Allergen Reactions  . Codeine Nausea And Vomiting  . Morphine And Related Nausea And Vomiting    VACCINATION STATUS: Immunization History  Administered Date(s) Administered  . Pneumococcal Polysaccharide-23 03/06/2016    Diabetes  He presents for his  follow-up diabetic visit. He has type 2 diabetes mellitus. Onset time: He was diagnosed at approximate age of 68 years. His disease course has been worsening. There are no hypoglycemic associated symptoms. Pertinent negatives for hypoglycemia include no confusion, headaches, pallor or seizures. Associated symptoms include polydipsia and polyuria. Pertinent negatives for diabetes include no chest pain, no fatigue, no polyphagia and no weakness. There are no hypoglycemic complications. Symptoms are worsening. Diabetic complications include heart disease and nephropathy. Risk factors for coronary artery disease include diabetes mellitus, dyslipidemia, hypertension, male sex, family history and sedentary lifestyle. Current diabetic treatment includes oral agent (monotherapy). His weight is decreasing steadily. He is following a generally unhealthy diet. When asked about meal planning, he reported none. He has not had a previous visit with a dietitian. He rarely participates in exercise. (He is accompanied by another person, just driving for him today.  He did not bring any meter no logs with him as recommended.  His recent labs show higher A1c of 11.2%.    )  Hyperlipidemia  This is a chronic problem. The current episode started more than 1 year ago. The problem is uncontrolled. Recent lipid tests were reviewed and are high. Exacerbating diseases include chronic renal disease and diabetes. Pertinent negatives include no chest pain, myalgias or shortness of breath. Current antihyperlipidemic treatment includes statins. Risk factors for coronary  artery disease include diabetes mellitus, dyslipidemia, hypertension, male sex and a sedentary lifestyle.  Hypertension  This is a chronic problem. The current episode started more than 1 year ago. The problem is controlled. Pertinent negatives include no chest pain, headaches, neck pain, palpitations or shortness of breath. Risk factors for coronary artery disease include dyslipidemia, diabetes mellitus, family history, male gender and sedentary lifestyle. Identifiable causes of hypertension include chronic renal disease.      Review of Systems  Constitutional: Negative for chills, fatigue, fever and unexpected weight change.  HENT: Negative for dental problem, mouth sores and trouble swallowing.   Eyes: Negative for visual disturbance.  Respiratory: Negative for cough, choking, chest tightness, shortness of breath and wheezing.   Cardiovascular: Negative for chest pain, palpitations and leg swelling.  Gastrointestinal: Negative for abdominal distention, abdominal pain, constipation, diarrhea, nausea and vomiting.  Endocrine: Positive for polydipsia and polyuria. Negative for polyphagia.  Genitourinary: Negative for dysuria, flank pain, hematuria and urgency.  Musculoskeletal: Negative for back pain, gait problem, myalgias and neck pain.  Skin: Negative for pallor, rash and wound.  Neurological: Negative for seizures, syncope, weakness, numbness and headaches.       Hard of hearing.  Psychiatric/Behavioral: Negative for confusion and dysphoric mood.    Objective:    BP 106/70   Ht _0  (1.626 m)   Wt 176 lb (79.8 kg)   BMI 30.21 kg/m   Wt Readings from Last 3 Encounters:  06/07/17 176 lb (79.8 kg)  06/03/17 179 lb 8 oz (81.4 kg)  05/28/17 181 lb 9.6 oz (82.4 kg)     Physical Exam  Constitutional: He is oriented to person, place, and time. He appears well-developed and well-nourished. He is cooperative. No distress.  HENT:  Head: Normocephalic and atraumatic.  Eyes: EOM are  normal.  Neck: Normal range of motion. Neck supple. No tracheal deviation present. No thyromegaly present.  Cardiovascular: Normal rate, S1 normal, S2 normal and normal heart sounds. Exam reveals no gallop.  No murmur heard. Pulses:      Dorsalis pedis pulses are 1+ on the right side, and 1+ on the left  side.       Posterior tibial pulses are 1+ on the right side, and 1+ on the left side.  Pulmonary/Chest: Breath sounds normal. No respiratory distress. He has no wheezes.  Abdominal: Soft. Bowel sounds are normal. He exhibits no distension. There is no tenderness. There is no guarding and no CVA tenderness.  Musculoskeletal: He exhibits no edema.       Right shoulder: He exhibits no swelling and no deformity.  Neurological: He is alert and oriented to person, place, and time. He has normal strength and normal reflexes. A sensory deficit is present. No cranial nerve deficit. Gait normal.  Patient is hard of hearing.  Skin: Skin is warm and dry. No rash noted. No cyanosis. Nails show no clubbing.  Psychiatric: He has a normal mood and affect. His speech is normal. Cognition and memory are normal.  Patient with significant cognitive deficit, he has positive effect.  His cousin is offering help.     CMP ( most recent) CMP     Component Value Date/Time   NA 130 (L) 06/03/2017 0923   K 3.6 06/03/2017 0923   CL 95 (L) 06/03/2017 0923   CO2 24 06/03/2017 0923   GLUCOSE 377 (H) 06/03/2017 0923   BUN 12 06/03/2017 0923   CREATININE 1.19 06/03/2017 0923   CREATININE 1.13 05/28/2017 1330   CALCIUM 8.3 (L) 06/03/2017 0923   PROT 6.2 (L) 06/03/2017 0923   ALBUMIN 2.9 (L) 06/03/2017 0923   AST 20 06/03/2017 0923   ALT 12 (L) 06/03/2017 0923   ALKPHOS 80 06/03/2017 0923   BILITOT 0.8 06/03/2017 0923   GFRNONAA >60 06/03/2017 0923   GFRNONAA 67 05/28/2017 1330   GFRAA >60 06/03/2017 0923   GFRAA 78 05/28/2017 1330     Diabetic Labs (most recent): Lab Results  Component Value Date    HGBA1C 11.2 (H) 05/28/2017   HGBA1C 8.0 (H) 08/16/2016    Lipid Panel     Component Value Date/Time   CHOL 182 08/17/2016 0517   TRIG 186 (H) 08/17/2016 0517   HDL 31 (L) 08/17/2016 0517   CHOLHDL 5.9 08/17/2016 0517   VLDL 37 08/17/2016 0517   LDLCALC 114 (H) 08/17/2016 0517      Lab Results  Component Value Date   TSH 0.65 05/28/2017   TSH 1.045 08/16/2016   TSH 1.396 03/05/2016   FREET4 1.3 05/28/2017      Assessment & Plan:   1. DM type 2 causing vascular disease ([place])  - Rc B Samaras has currently uncontrolled symptomatic type 2 DM since 67 years of age. -He was supposed to bring his meter and logs for possible initiation of insulin treatment, however shows up with none of these. -His recent labs show A1c of 11.2%,  recent CMP is reviewed showing CKD.    -his diabetes is complicated by coronary artery disease, CKD and Naythan B Holliman remains at a high risk for more acute and chronic complications which include CAD, CVA, CKD, retinopathy, and neuropathy. These are all discussed in detail with the patient.  - I have counseled him on diet management and weight loss, by adopting a carbohydrate restricted/protein rich diet. -  Suggestion is made for him to avoid simple carbohydrates  from his diet including Cakes, Sweet Desserts / Pastries, Ice Cream, Soda (diet and regular), Sweet Tea, Candies, Chips, Cookies, Store Bought Juices, Alcohol in Excess of  1-2 drinks a day, Artificial Sweeteners, and "Sugar-free" Products. This will help patient to have stable  blood glucose profile and potentially avoid unintended weight gain.  - I encouraged him to switch to  unprocessed or minimally processed complex starch and increased protein intake (animal or plant source), fruits, and vegetables.  - he is advised to stick to a routine mealtimes to eat 3 meals  a day and avoid unnecessary snacks ( to snack only to correct hypoglycemia).   - he will be scheduled with Jearld Fenton, RDN,  CDE for individualized diabetes education.  - I have approached him with the following individualized plan to manage diabetes and patient agrees:   -Based on his current significant glycemic burden, this patient will require at least basal insulin to control glycemia. -Considering his evident cognitive deficit, inadequate social support, treatment regimen should be simple as possible.   -He shows up with no logs nor meter and verbally reports that he has not been monitoring.   - Patient  is at risk of hypoglycemia if given insulin without enough supervision. -He is asked to return in 1 week with a meter and logs monitoring 4 times a day-before meals and at bedtime.  -Patient is encouraged to call clinic for blood glucose levels less than 70 or above 300 mg /dl.  -It is unlikely for him to be able to execute insulin treatment.  If enough support cannot be arranged for him to obtain at home, he should be considered for placement in group home for intensive treatment with insulin.  -In the meantime, I advised him to continue Tradjenta 5 mg p.o. every morning with breakfast.  -Patient is not a candidate for  SGLT2 inhibitors due to CKD. -He will be considered for low-dose metformin during his next visit.   - Patient specific target  A1c;  LDL, HDL, Triglycerides, and  Waist Circumference were discussed in detail.  2) BP/HTN: His blood pressure is controlled to target.  Advised him to continue his current blood pressure medications.    3) Lipids/HPL:   Controlled with LDL of 114,   Patient is advised to continue statins. 4)  Weight/Diet: CDE Consult will be initiated , exercise, and detailed carbohydrates information provided.  5) Chronic Care/Health Maintenance:  -he  Is on Statin medications and  is encouraged to continue to follow up with Ophthalmology, Dentist,  Podiatrist at least yearly or according to recommendations, and advised to  stay away from smoking. I have recommended yearly flu  vaccine and pneumonia vaccination at least every 5 years; moderate intensity exercise for up to 150 minutes weekly; and  sleep for at least 7 hours a day.  - I advised patient to maintain close follow up with Sinda Du, MD for primary care needs.  - Time spent with the patient: 25 min, of which >50% was spent in reviewing his  current and  previous labs, previous treatments, and medications doses and developing a plan for long-term care.  Ella Jubilee participated in the discussions, expressed understanding, and voiced agreement with the above plans.  All questions were answered to his satisfaction. he is encouraged to contact clinic should he have any questions or concerns prior to his return visit.  Follow up plan: - Return in about 1 week (around 06/14/2017) for follow up with meter and logs- no labs.  Glade Lloyd, MD [facility] [address] [place], [place] [address] Phone: [phone]  Fax: [phone]    06/07/2017, 4:36 PM  This note was partially dictated with voice recognition software. Similar sounding words can  be transcribed inadequately or may not  be corrected upon review.

## 2017-06-08 DIAGNOSIS — C159 Malignant neoplasm of esophagus, unspecified: Secondary | ICD-10-CM | POA: Diagnosis not present

## 2017-06-08 DIAGNOSIS — N183 Chronic kidney disease, stage 3 (moderate): Secondary | ICD-10-CM | POA: Diagnosis not present

## 2017-06-08 DIAGNOSIS — F319 Bipolar disorder, unspecified: Secondary | ICD-10-CM | POA: Diagnosis not present

## 2017-06-08 DIAGNOSIS — E1151 Type 2 diabetes mellitus with diabetic peripheral angiopathy without gangrene: Secondary | ICD-10-CM | POA: Diagnosis not present

## 2017-06-10 ENCOUNTER — Ambulatory Visit (INDEPENDENT_AMBULATORY_CARE_PROVIDER_SITE_OTHER): Payer: Medicare Other | Admitting: Cardiothoracic Surgery

## 2017-06-10 ENCOUNTER — Other Ambulatory Visit: Payer: Self-pay

## 2017-06-10 ENCOUNTER — Other Ambulatory Visit: Payer: Self-pay | Admitting: *Deleted

## 2017-06-10 VITALS — BP 101/73 | HR 87 | Resp 16 | Ht 64.0 in | Wt 178.0 lb

## 2017-06-10 DIAGNOSIS — C159 Malignant neoplasm of esophagus, unspecified: Secondary | ICD-10-CM

## 2017-06-10 DIAGNOSIS — C160 Malignant neoplasm of cardia: Secondary | ICD-10-CM

## 2017-06-10 DIAGNOSIS — I25118 Atherosclerotic heart disease of native coronary artery with other forms of angina pectoris: Secondary | ICD-10-CM | POA: Diagnosis not present

## 2017-06-10 NOTE — Progress Notes (Signed)
[address]       [place] [address]             [id number]                    Dirck B Reamy [facility] Medical Record #[id number] Date of Birth: October 10, 1950  Referring: Creola Corn, MD Primary Care: Sinda Du, MD Primary Cardiologist: Kate Sable, MD  Chief Complaint:    Chief Complaint  Patient presents with  . Follow-up     1 month..has finished radiation, but chemo had to be held d/t decreased platelets    History of Present Illness:    [name] 67 y.o. 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  Patient notes starting in June 2018 having increasing trouble eating.  He noted this progressed to the point where he was only able to eat ice cream.  He denies any specific weight loss.  Notes that he would have episodes of rapid vomiting after he eats.  In February 2019 he underwent endoscopy showing a distal esophageal lesion follow-up upper GI endoscopy with EUS was performed February 22.  The patient is started on course of radiation and chemotherapy for distal esophageal cancer.    Patient was unable to complete his courses of chemotherapy because of low platelet count down to the 40,000s.  He completed a course of radiation therapy on Wednesday, April 17.  He continues to take a soft and liquid diet.  He is been having increasing difficulty with his diabetes with elevated blood sugars.  He has moderate aortic stenosis was severe LV dysfunction question of previous anterior myocardial infarction no previous cardiac catheterization has been done.  Previous echocardiograms have had ejection fraction fluctuating from 20 to 45% depending on the timing of the study.   Current Activity/ Functional Status:  Patient is independent with mobility/ambulation, transfers, ADL's, IADL's.   Zubrod Score: At the time of surgery this patient's most  appropriate activity status/level should be described as: []     0    Normal activity, no symptoms [x]     1    Restricted in physical strenuous activity but ambulatory, able to do out light work []     2    Ambulatory and capable of self care, unable to do work activities, up and about               >50 % of waking hours                              []     3    Only limited self care, in bed greater than 50% of waking hours []     4    Completely disabled, no self care, confined to bed or chair []     5    Moribund   Past Medical History:  Diagnosis Date  . Arthritis   . Bipolar disorder ([place])   . CAD (coronary artery disease) 08/16/2016  . Cellulitis and abscess of foot 03/05/2016  . Chronic combined systolic and diastolic heart failure ([place])   . Diabetes mellitus without complication ([place])    boderline  . GERD (gastroesophageal reflux disease)   . Heart murmur 1995  . History of kidney stones   . Hypercholesteremia   . Hypertension   .  Kidney stones   . Myocardial infarction ([place])   . OSA (obstructive sleep apnea)    no cpap, can't tolerate  . Schizoaffective disorder, bipolar type ([place]) 04/25/2016    Past Surgical History:  Procedure Laterality Date  . APPENDECTOMY    . BIOPSY  03/09/2017   Procedure: BIOPSY;  Surgeon: Danie Binder, MD;  [facility];  Service: Endoscopy;;  gastric esophageal  . CHOLECYSTECTOMY N/A 10/20/2013   Procedure: LAPAROSCOPIC CHOLECYSTECTOMY;  Surgeon: [name], MD;  Location: [facility];  Service: General;  Laterality: N/A;  . COLONOSCOPY WITH PROPOFOL N/A 03/09/2017   Procedure: COLONOSCOPY WITH PROPOFOL;  Surgeon: Danie Binder, MD;  [facility];  Service: Endoscopy;  Laterality: N/A;  12:15pm  . ESOPHAGOGASTRODUODENOSCOPY (EGD) WITH PROPOFOL N/A 03/09/2017   Procedure: ESOPHAGOGASTRODUODENOSCOPY (EGD) WITH PROPOFOL;  Surgeon: Danie Binder, MD;  [facility];  Service: Endoscopy;  Laterality: N/A;  . EUS N/A  04/08/2017   Procedure: UPPER ENDOSCOPIC ULTRASOUND (EUS) RADIAL;  Surgeon: Milus Banister, MD;  Location: [facility];  Service: Endoscopy;  Laterality: N/A;  . HIP SURGERY Left   . KNEE SURGERY Left   . POLYPECTOMY  03/09/2017   Procedure: POLYPECTOMY;  Surgeon: Danie Binder, MD;  [facility];  Service: Endoscopy;;  colon   . PORTACATH PLACEMENT Left 04/07/2017   Procedure: INSERTION PORT-A-CATH LEFT SUBCLAVIAN;  Surgeon: Virl Cagey, MD;  Location: [facility];  Service: General;  Laterality: Left;  . SAVORY DILATION N/A 03/09/2017   Procedure: SAVORY DILATION;  Surgeon: Danie Binder, MD;  [facility];  Service: Endoscopy;  Laterality: N/A;    Family History  Problem Relation Age of Onset  . Hypertension Mother   . Dementia Mother   . Cancer Mother        breast  . Colon cancer Neg Hx   . Colon polyps Neg Hx     Social History   Socioeconomic History  . Marital status: Divorced    Social History   Tobacco Use  Smoking Status Never Smoker  Smokeless Tobacco Never Used    Social History   Substance and Sexual Activity  Alcohol Use No     Allergies  Allergen Reactions  . Codeine Nausea And Vomiting  . Morphine And Related Nausea And Vomiting    Current Outpatient Medications  Medication Sig Dispense Refill  . acetaminophen (TYLENOL) 325 MG tablet Take 650 mg by mouth every 6 (six) hours as needed.    . AMITIZA 24 MCG capsule Take 24 mcg by mouth 2 (two) times a week.     Marland Kitchen aspirin EC 81 MG tablet Take 81 mg by mouth daily.    . Blood Glucose Monitoring Suppl (ONETOUCH VERIO) w/Device KIT 1 each by Does not apply route as needed. 1 kit 0  . dexamethasone (DECADRON) 4 MG tablet Take 2 tablets (8 mg total) by mouth daily. Start the day after chemotherapy for 2 days. 30 tablet 1  . dexlansoprazole (DEXILANT) 60 MG capsule Take 1 capsule (60 mg total) by mouth daily. 90 capsule 3  . furosemide (LASIX) 40 MG tablet Alternate 40 mg one  day and 20 mg the next (Patient taking differently: 40 mg daily. ) 90 tablet 3  . glucose blood (ONETOUCH VERIO) test strip Use as instructed 4 x daily. E11.65 150 each 2  . lidocaine (XYLOCAINE) 2 % solution     . linagliptin (TRADJENTA) 5 MG TABS tablet  Take 5 mg by mouth daily.    Marland Kitchen OLANZapine (ZYPREXA) 5 MG tablet Take 5 mg by mouth 2 (two) times daily.    . ondansetron (ZOFRAN ODT) 8 MG disintegrating tablet Take 1 tablet (8 mg total) by mouth every 8 (eight) hours as needed for nausea or vomiting. 40 tablet 2  . oxyCODONE (ROXICODONE) 5 MG immediate release tablet Take 1 tablet (5 mg total) by mouth every 4 (four) hours as needed. 10 tablet 0  . PACLitaxel (TAXOL IV) Inject into the vein.    . potassium chloride SA (K-DUR,KLOR-CON) 20 MEQ tablet Take 20 mEq by mouth daily.    . prochlorperazine (COMPAZINE) 10 MG tablet Take 1 tablet (10 mg total) by mouth every 6 (six) hours as needed (Nausea or vomiting). 30 tablet 1  . rosuvastatin (CRESTOR) 10 MG tablet Take 10 mg by mouth daily.    . sacubitril-valsartan (ENTRESTO) 49-51 MG Take 1 tablet by mouth 2 (two) times daily. 60 tablet 6  . CARBOPLATIN IV Inject into the vein.    Marland Kitchen lidocaine-prilocaine (EMLA) cream Apply a quarter size amount to affected area 1 hour prior to coming to chemotherapy. Do not rub in. Cover with plastic wrap. (Patient not taking: Reported on 06/03/2017) 30 g 2  . magic mouthwash w/lidocaine SOLN Take 5 mLs by mouth 4 (four) times daily as needed for mouth pain. (Patient not taking: Reported on 06/03/2017) 600 mL 1   No current facility-administered medications for this visit.     Pertinent items are noted in HPI.   Review of Systems:  ROS Physical Exam: BP 101/73 (BP Location: Left Arm, Patient Position: Sitting, Cuff Size: Normal)   Pulse 87   Resp 16   Ht 5' 4"  (1.626 m)   Wt 178 lb (80.7 kg)   SpO2 97% Comment: ON RA  BMI 30.55 kg/m   PHYSICAL EXAMINATION: General appearance: alert and  cooperative Head: Normocephalic, without obvious abnormality, atraumatic Neck: no adenopathy, no carotid bruit, no JVD, supple, symmetrical, trachea midline and thyroid not enlarged, symmetric, no tenderness/mass/nodules Lymph nodes: Cervical, supraclavicular, and axillary nodes normal. Resp: clear to auscultation bilaterally Cardio: regular rate and rhythm, S1, S2 normal, grade 2/6 early systolic murmur consistent with aortic stenosis, click, rub or gallop GI: soft, non-tender; bowel sounds normal; no masses,  no organomegaly Extremities: extremities normal, atraumatic, no cyanosis or edema and Homans sign is negative, no sign of DVT Neurologic: Grossly normal  Diagnostic Studies & Laboratory data:     Recent Radiology Findings:  CLINICAL DATA:  Initial. Treatment strategy for esophageal adenocarcinoma.  EXAM: NUCLEAR MEDICINE PET SKULL BASE TO THIGH  TECHNIQUE: 12.7 mCi F-18 FDG was injected intravenously. Full-ring PET imaging was performed from the skull base to thigh after the radiotracer. CT data was obtained and used for attenuation correction and anatomic localization.  FASTING BLOOD GLUCOSE:  Value: 187 mg/dl  COMPARISON:  None.  FINDINGS: NECK: No hypermetabolic lymph nodes in the neck.  CHEST: Mild cardiac enlargement. LAD coronary artery calcifications noted.  The trachea appears patent and midline. Distal esophageal mass is identified measuring 7.6 cm in length within SUV max equal to 15.68. This extends from the level of the left inferior pulmonary vein to just above the diaphragmatic hiatus.  No hypermetabolic mediastinal lymph nodes or hilar lymph nodes identified. No axillary or supraclavicular adenopathy.  No pleural effusions identified.  No pulmonary nodules or masses.  ABDOMEN/PELVIS: No abnormal hypermetabolic activity within the liver, pancreas, adrenal glands, or spleen. No  hypermetabolic lymph nodes in the abdomen or pelvis. Left-sided  inferior vena cava. Aortic atherosclerosis.  SKELETON: No focal hypermetabolic activity to suggest skeletal metastasis.  IMPRESSION: 1. Mass involving the distal esophagus measures approximately 7.6 cm in length and exhibits intense FDG uptake compatible with primary esophageal neoplasm. 2. No evidence for solid organ metastasis and no hypermetabolic lymph nodes identified. 3. Aortic Atherosclerosis (ICD10-I70.0). Lad coronary artery calcifications noted.   Electronically Signed   By: Kerby Moors M.D.   On: 03/24/2017 12:54 CLINICAL DATA:  Neoplasm of digestive system: Gastric, staging.  EXAM: CT CHEST, ABDOMEN, AND PELVIS WITH CONTRAST  TECHNIQUE: Multidetector CT imaging of the chest, abdomen and pelvis was performed following the standard protocol during bolus administration of intravenous contrast.  CONTRAST:  160m ISOVUE-300 IOPAMIDOL (ISOVUE-300) INJECTION 61%  COMPARISON:  CT urogram 12/15/2007. Reports from colonoscopy and upper endoscopy 03/09/2017. Esophageal and gastric adenocarcinoma on biopsies.  FINDINGS: CT CHEST FINDINGS  Cardiovascular: Mild atherosclerosis of the aorta, great vessels and coronary arteries. No acute vascular findings on noncontrast imaging. Probable aortic valvular calcifications. The heart size is normal. There is no pericardial effusion.  Mediastinum/Nodes: There are no enlarged mediastinal, hilar or axillary lymph nodes. The thyroid gland and trachea appear normal. There is an irregular mass involving the distal 3rd of the esophagus, extending approximately 8 cm in length on sagittal image 116. No mediastinal extension of tumor, significant proximal esophageal distension or adjacent lymphadenopathy.  Lungs/Pleura: No pleural effusion or pneumothorax. The lungs are essentially clear without suspicious pulmonary nodules.  Musculoskeletal/Chest wall: No chest wall mass or suspicious osseous findings.  CT  ABDOMEN AND PELVIS FINDINGS  Hepatobiliary: The liver is normal in density without focal abnormality. Previous cholecystectomy without significant biliary dilatation.  Pancreas: Unremarkable. No pancreatic ductal dilatation or surrounding inflammatory changes.  Spleen: Normal in size without focal abnormality.  Adrenals/Urinary Tract: Both adrenal glands appear normal. Chronic asymmetric renal cortical thinning on the left. There are small renal cysts bilaterally. No evidence of renal mass, hydronephrosis or ureteral calculus. The bladder appears normal.  Stomach/Bowel: As above, there is a distal esophageal mass which extends to the gastroesophageal junction. No definite mass in the gastric cardia or fundus. The stomach is incompletely distended and suboptimally evaluated. No evidence of bowel wall thickening, significant distention or surrounding inflammation. Moderate stool throughout the colon. Probable previous appendectomy.  Vascular/Lymphatic: There are no enlarged abdominal or pelvic lymph nodes. There are small celiac and porta hepatis lymph nodes, measuring up to 9 on image 55. There is diffuse aortic and branch vessel atherosclerosis with intimal irregularity in the abdominal aorta. No acute vascular findings.  Reproductive: The prostate gland and seminal vesicles demonstrate no significant findings.  Other: Small umbilical hernia containing only fat.  No ascites.  Musculoskeletal: No acute or significant osseous findings. Presumed postsurgical changes in the left iliac crest, stable. Mild lumbar spondylosis.  IMPRESSION: 1. Large intraluminal mass in the distal esophagus consistent with adenocarcinoma. No esophageal obstruction, mediastinal extension of tumor or definite metastatic disease. No definite extension of tumor in the stomach visualized. 2. Small upper abdominal lymph nodes, nonspecific. No enlarged lymph nodes identified. 3. Chronic left  renal cortical thinning. 4.  Aortic Atherosclerosis (ICD10-I70.0).   Electronically Signed   By: WRichardean SaleM.D.   On: 03/12/2017 16:07   I have independently reviewed the above radiology studies  and reviewed the findings with the patient.  Endoscopic Finding Findings: 1. Friable, nearly obstructing, large mass, circumferential (at most distal aspect) with  proximal edge located 32cm from the incisors and distal edge located at 39cm (just below the GE junction). I was able to advance the large diameter (1cm) radial echoendoscope throught the malignant stricture with minor resistence. 2. 1.5cm firm nodule, ulcerated, clearly malignant, located 2cm distal to the GE junction in the cardia of the stomach. 3. Otherwise normal examination. Endosonographic Finding 1. The GE junction mass described above correlated with a bulky, hypoechoic, heterogeneous mas that clearly passed into and through the muscularis proprial layer (at the distal site of circumferential growth). (uT3). 2. I could not evaluate the satellite lesion due to it's very proximal gastric location. 3. No paraesophaegal adenopathy. (uN0). 4. Limited views of liver, pancreas were no  Recent Lab Findings: Lab Results  Component Value Date   WBC 5.2 06/03/2017   HGB 13.7 06/03/2017   HCT 39.1 06/03/2017   PLT 90 (L) 06/03/2017   GLUCOSE 377 (H) 06/03/2017   CHOL 182 08/17/2016   TRIG 186 (H) 08/17/2016   HDL 31 (L) 08/17/2016   LDLCALC 114 (H) 08/17/2016   ALT 12 (L) 06/03/2017   AST 20 06/03/2017   NA 130 (L) 06/03/2017   K 3.6 06/03/2017   CL 95 (L) 06/03/2017   CREATININE 1.19 06/03/2017   BUN 12 06/03/2017   CO2 24 06/03/2017   TSH 0.65 05/28/2017   INR 1.11 08/17/2016   HGBA1C 11.2 (H) 05/28/2017   PATH: Diagnosis 1. Colon, polyp(s), cecal, proximal transverse, splenic flexure, sigmoid - TUBULAR ADENOMA (X9 FRAGMENTS). - NO HIGH GRADE DYSPLASIA OR MALIGNANCY. 2. Colon, polyp(s), cecal -  POLYPOID ARTERIOVENOUS MALFORMATION. - NO DYSPLASIA OR MALIGNANCY. 3. Stomach, biopsy, and gastric polyp - FUNDIC GLAND POLYPS. - FRAGMENT WITH MILD REACTIVE GASTROPATHY. - NEGATIVE FOR HELICOBACTER PYLORI. - NO INTESTINAL METAPLASIA, DYSPLASIA, OR MALIGNANCY. 4. Stomach, biopsy - ADENOCARCINOMA. 5. Esophagus, biopsy - ADENOCARCINOMA. 1 of   Assessment / Plan:   1/  7cm long, bulky, focally circumferential (distally), uT3N0M0 (Stage II) GE junction adenocarcinoma with nearby satellite lesion 2cm distal to the GE junction 2/ moderate aortic stenosis was severe LV dysfunction question of previous anterior myocardial infarction no previous cardiac catheterization has been done  Patient has been unable to complete his planned chemotherapy due to low platelet count prior to planning any definitive surgery we will obtain a follow-up CT of the chest and abdomen.  With his known aortic stenosis and poor LV function we will have the patient follow-up with cardiology for a risk assessment before any major surgical resection.  I plan to see him back in 5 to 6 weeks with a follow-up scan. I discussed the treatment plan with he and his sister.  I  spent 20 minutes with  the patient face to face and greater then 50% of the time was spent in counseling and coordination of care.   Grace Isaac MD      [address] ,[place] [address] Office [phone]   Beeper [phone]  06/10/2017 1:47 PM

## 2017-06-16 ENCOUNTER — Other Ambulatory Visit: Payer: Self-pay | Admitting: *Deleted

## 2017-06-16 ENCOUNTER — Ambulatory Visit: Payer: Medicare Other | Admitting: "Endocrinology

## 2017-06-16 ENCOUNTER — Ambulatory Visit: Payer: Self-pay | Admitting: Radiation Oncology

## 2017-06-16 ENCOUNTER — Other Ambulatory Visit: Payer: Self-pay | Admitting: Cardiovascular Disease

## 2017-06-16 DIAGNOSIS — C159 Malignant neoplasm of esophagus, unspecified: Secondary | ICD-10-CM

## 2017-06-18 ENCOUNTER — Other Ambulatory Visit: Payer: Self-pay

## 2017-06-18 ENCOUNTER — Ambulatory Visit (HOSPITAL_COMMUNITY): Payer: Medicare Other

## 2017-06-18 ENCOUNTER — Inpatient Hospital Stay (HOSPITAL_COMMUNITY): Payer: Medicare Other | Attending: Hematology | Admitting: Hematology

## 2017-06-18 ENCOUNTER — Inpatient Hospital Stay (HOSPITAL_COMMUNITY): Payer: Medicare Other

## 2017-06-18 ENCOUNTER — Encounter (HOSPITAL_COMMUNITY): Payer: Self-pay | Admitting: Hematology

## 2017-06-18 DIAGNOSIS — K219 Gastro-esophageal reflux disease without esophagitis: Secondary | ICD-10-CM | POA: Diagnosis not present

## 2017-06-18 DIAGNOSIS — M129 Arthropathy, unspecified: Secondary | ICD-10-CM | POA: Diagnosis not present

## 2017-06-18 DIAGNOSIS — G4730 Sleep apnea, unspecified: Secondary | ICD-10-CM

## 2017-06-18 DIAGNOSIS — Z9221 Personal history of antineoplastic chemotherapy: Secondary | ICD-10-CM | POA: Diagnosis not present

## 2017-06-18 DIAGNOSIS — R531 Weakness: Secondary | ICD-10-CM

## 2017-06-18 DIAGNOSIS — Z87442 Personal history of urinary calculi: Secondary | ICD-10-CM | POA: Diagnosis not present

## 2017-06-18 DIAGNOSIS — I350 Nonrheumatic aortic (valve) stenosis: Secondary | ICD-10-CM | POA: Diagnosis not present

## 2017-06-18 DIAGNOSIS — Z923 Personal history of irradiation: Secondary | ICD-10-CM | POA: Diagnosis not present

## 2017-06-18 DIAGNOSIS — R1110 Vomiting, unspecified: Secondary | ICD-10-CM

## 2017-06-18 DIAGNOSIS — I110 Hypertensive heart disease with heart failure: Secondary | ICD-10-CM

## 2017-06-18 DIAGNOSIS — E119 Type 2 diabetes mellitus without complications: Secondary | ICD-10-CM

## 2017-06-18 DIAGNOSIS — C160 Malignant neoplasm of cardia: Secondary | ICD-10-CM

## 2017-06-18 DIAGNOSIS — I252 Old myocardial infarction: Secondary | ICD-10-CM | POA: Diagnosis not present

## 2017-06-18 DIAGNOSIS — I5042 Chronic combined systolic (congestive) and diastolic (congestive) heart failure: Secondary | ICD-10-CM

## 2017-06-18 DIAGNOSIS — Z803 Family history of malignant neoplasm of breast: Secondary | ICD-10-CM

## 2017-06-18 DIAGNOSIS — F418 Other specified anxiety disorders: Secondary | ICD-10-CM

## 2017-06-18 DIAGNOSIS — Z8619 Personal history of other infectious and parasitic diseases: Secondary | ICD-10-CM | POA: Diagnosis not present

## 2017-06-18 DIAGNOSIS — C158 Malignant neoplasm of overlapping sites of esophagus: Secondary | ICD-10-CM

## 2017-06-18 DIAGNOSIS — E7800 Pure hypercholesterolemia, unspecified: Secondary | ICD-10-CM | POA: Diagnosis not present

## 2017-06-18 DIAGNOSIS — Z7982 Long term (current) use of aspirin: Secondary | ICD-10-CM | POA: Diagnosis not present

## 2017-06-18 DIAGNOSIS — Z79899 Other long term (current) drug therapy: Secondary | ICD-10-CM | POA: Diagnosis not present

## 2017-06-18 DIAGNOSIS — I2510 Atherosclerotic heart disease of native coronary artery without angina pectoris: Secondary | ICD-10-CM

## 2017-06-18 DIAGNOSIS — R011 Cardiac murmur, unspecified: Secondary | ICD-10-CM | POA: Diagnosis not present

## 2017-06-18 LAB — CBC WITH DIFFERENTIAL/PLATELET
BASOS ABS: 0 10*3/uL (ref 0.0–0.1)
BASOS PCT: 0 %
EOS ABS: 0.3 10*3/uL (ref 0.0–0.7)
EOS PCT: 4 %
HCT: 45.3 % (ref 39.0–52.0)
Hemoglobin: 15.1 g/dL (ref 13.0–17.0)
Lymphocytes Relative: 12 %
Lymphs Abs: 0.9 10*3/uL (ref 0.7–4.0)
MCH: 29.1 pg (ref 26.0–34.0)
MCHC: 33.3 g/dL (ref 30.0–36.0)
MCV: 87.3 fL (ref 78.0–100.0)
MONO ABS: 0.4 10*3/uL (ref 0.1–1.0)
Monocytes Relative: 5 %
NEUTROS ABS: 6.1 10*3/uL (ref 1.7–7.7)
Neutrophils Relative %: 79 %
Platelets: 129 10*3/uL — ABNORMAL LOW (ref 150–400)
RBC: 5.19 MIL/uL (ref 4.22–5.81)
RDW: 16.2 % — AB (ref 11.5–15.5)
WBC: 7.7 10*3/uL (ref 4.0–10.5)

## 2017-06-18 LAB — COMPREHENSIVE METABOLIC PANEL
ALBUMIN: 3.4 g/dL — AB (ref 3.5–5.0)
ALT: 16 U/L — ABNORMAL LOW (ref 17–63)
ANION GAP: 10 (ref 5–15)
AST: 23 U/L (ref 15–41)
Alkaline Phosphatase: 109 U/L (ref 38–126)
BILIRUBIN TOTAL: 0.9 mg/dL (ref 0.3–1.2)
BUN: 8 mg/dL (ref 6–20)
CO2: 27 mmol/L (ref 22–32)
Calcium: 9.1 mg/dL (ref 8.9–10.3)
Chloride: 103 mmol/L (ref 101–111)
Creatinine, Ser: 1.07 mg/dL (ref 0.61–1.24)
GFR calc non Af Amer: >60 mL/min (ref >60)
GLUCOSE: 160 mg/dL — AB (ref 65–99)
Potassium: 4.2 mmol/L (ref 3.5–5.1)
Sodium: 140 mmol/L (ref 135–145)
TOTAL PROTEIN: 7 g/dL (ref 6.5–8.1)

## 2017-06-18 NOTE — Progress Notes (Signed)
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 male returns for follow-up visit.   He completed radiation to the esophagus from 04/19/17-05/26/17 under the care of Dr. Lisbeth Renshaw at [facility].  He was only able to tolerate 2 doses of Carbo/Taxol d/t cytopenias.  Last cycle of chemo given on 04/28/17.   Chart reviewed. He saw Dr. Servando Snare with cardiothoracic surgery on 06/10/17.  Per Dr. Everrett Coombe note, since patient was unable to complete full course of chemotherapy, he plans to obtain restaging CT chest/abd.  This is scheduled for 07/19/17 at this time.  He also has known aortic stenosis and poor LV function, so he was referred to cardiology for pre-surgery assessment/clearance.    Here today with family.   He reports continuing to have issues with vomiting. He has been eating breads and meats.  He is trying to drink 1-2 Glucerna per day.  He still feels weak at times.  He is eating some soft foods like applesauce.  He is not sure what foods he should be eating; his family is asking for additional support from our dietitian. We will ask [name], RD to see the patient today.    His blood sugars are better; glucose today is 160. His platelet count is also better at 129,000.        REVIEW OF SYSTEMS:  Review of Systems  HENT:   Positive for trouble swallowing.   Gastrointestinal: Positive for vomiting.  All other systems reviewed and are negative.    PAST MEDICAL/SURGICAL HISTORY:  Past Medical History:  Diagnosis Date  . Arthritis   . Bipolar  disorder ([place])   . CAD (coronary artery disease) 08/16/2016  . Cellulitis and abscess of foot 03/05/2016  . Chronic combined systolic and diastolic heart failure ([place])   . Diabetes mellitus without complication ([place])    boderline  . GERD (gastroesophageal reflux disease)   . Heart murmur 1995  . History of kidney stones   . Hypercholesteremia   . Hypertension   . Kidney stones   . Myocardial infarction ([place])   . OSA (obstructive sleep apnea)    no cpap, can't tolerate  . Schizoaffective disorder, bipolar type ([place]) 04/25/2016   Past Surgical History:  Procedure Laterality Date  . APPENDECTOMY    . BIOPSY  03/09/2017   Procedure: BIOPSY;  Surgeon: Danie Binder, MD;  Location[facility];  Service: Endoscopy;;  gastric esophageal  . CHOLECYSTECTOMY N/A 10/20/2013   Procedure: LAPAROSCOPIC CHOLECYSTECTOMY;  Surgeon: [name], MD;  Location: [facility] ORS;  Service: General;  Laterality: N/A;  . COLONOSCOPY WITH PROPOFOL N/A 03/09/2017   Procedure: COLONOSCOPY WITH PROPOFOL;  Surgeon: Danie Binder, MD;  Location[facility];  Service: Endoscopy;  Laterality: N/A;  12:15pm  . ESOPHAGOGASTRODUODENOSCOPY (EGD) WITH PROPOFOL N/A 03/09/2017   Procedure: ESOPHAGOGASTRODUODENOSCOPY (EGD) WITH PROPOFOL;  Surgeon: Danie Binder, MD;  [facility];  Service: Endoscopy;  Laterality: N/A;  . EUS N/A 04/08/2017   Procedure: UPPER ENDOSCOPIC ULTRASOUND (EUS) RADIAL;  Surgeon: Milus Banister, MD;  Location: [facility];  Service: Endoscopy;  Laterality: N/A;  . HIP SURGERY Left   . KNEE SURGERY Left   . POLYPECTOMY  03/09/2017   Procedure: POLYPECTOMY;  Surgeon: Danie Binder, MD;  [facility];  Service: Endoscopy;;  colon   . PORTACATH PLACEMENT Left 04/07/2017   Procedure: INSERTION PORT-A-CATH LEFT SUBCLAVIAN;  Surgeon: Virl Cagey, MD;  Location: [facility];  Service: General;  Laterality: Left;  . SAVORY DILATION N/A 03/09/2017   Procedure: SAVORY DILATION;   Surgeon: Danie Binder, MD;  [facility];  Service: Endoscopy;  Laterality: N/A;     SOCIAL HISTORY:  Social History   Socioeconomic History  . Marital status: Divorced    Spouse name: Not on file  . Number of children: Not on file  . Years of education: Not on file  . Highest education level: Not on file  Occupational History  . Not on file  Social Needs  . Financial resource strain: Not on file  . Food insecurity:    Worry: Not on file    Inability: Not on file  . Transportation needs:    Medical: Not on file    Non-medical: Not on file  Tobacco Use  . Smoking status: Never Smoker  . Smokeless tobacco: Never Used  Substance and Sexual Activity  . Alcohol use: No  . Drug use: No  . Sexual activity: Not Currently    Birth control/protection: Implant  Lifestyle  . Physical activity:    Days per week: Not on file    Minutes per session: Not on file  . Stress: Not on file  Relationships  . Social connections:    Talks on phone: Not on file    Gets together: Not on file    Attends religious service: Not on file    Active member of club or organization: Not on file    Attends meetings of clubs or organizations: Not on file    Relationship status: Not on file  . Intimate partner violence:    Fear of current or ex partner: Not on file    Emotionally abused: Not on file    Physically abused: Not on file    Forced sexual activity: Not on file  Other Topics Concern  . Not on file  Social History Narrative  . Not on file    FAMILY HISTORY:  Family History  Problem Relation Age of Onset  . Hypertension Mother   . Dementia Mother   . Cancer Mother        breast  . Colon cancer Neg Hx   . Colon polyps Neg Hx     CURRENT MEDICATIONS:  Outpatient Encounter Medications as of 06/18/2017  Medication Sig  . acetaminophen (TYLENOL) 325 MG tablet Take 650 mg by mouth every 6 (six) hours as needed.  . AMITIZA 24 MCG capsule Take 24 mcg by mouth 2 (two) times  a week.   Marland Kitchen aspirin EC 81 MG tablet Take 81 mg by mouth daily.  . Blood Glucose Monitoring Suppl (ONETOUCH VERIO) w/Device KIT 1 each by Does not apply route as needed.  Marland Kitchen CARBOPLATIN IV Inject into the vein.  Marland Kitchen dexamethasone (DECADRON) 4 MG tablet Take 2 tablets (8 mg total) by mouth daily. Start the day after chemotherapy for 2 days.  Marland Kitchen dexlansoprazole (DEXILANT) 60 MG capsule Take 1 capsule (60 mg total) by mouth daily.  Marland Kitchen ENTRESTO 49-51 MG TAKE 1 TABLET BY MOUTH TWICE DAILY  . furosemide (LASIX) 40 MG tablet Alternate  40 mg one day and 20 mg the next (Patient taking differently: 40 mg daily. )  . glucose blood (ONETOUCH VERIO) test strip Use as instructed 4 x daily. E11.65  . lidocaine (XYLOCAINE) 2 % solution   . lidocaine-prilocaine (EMLA) cream Apply a quarter size amount to affected area 1 hour prior to coming to chemotherapy. Do not rub in. Cover with plastic wrap.  . linagliptin (TRADJENTA) 5 MG TABS tablet Take 5 mg by mouth daily.  . magic mouthwash w/lidocaine SOLN Take 5 mLs by mouth 4 (four) times daily as needed for mouth pain.  Marland Kitchen OLANZapine (ZYPREXA) 5 MG tablet Take 5 mg by mouth 2 (two) times daily.  . ondansetron (ZOFRAN ODT) 8 MG disintegrating tablet Take 1 tablet (8 mg total) by mouth every 8 (eight) hours as needed for nausea or vomiting.  Marland Kitchen oxyCODONE (ROXICODONE) 5 MG immediate release tablet Take 1 tablet (5 mg total) by mouth every 4 (four) hours as needed.  Marland Kitchen PACLitaxel (TAXOL IV) Inject into the vein.  . potassium chloride SA (K-DUR,KLOR-CON) 20 MEQ tablet Take 20 mEq by mouth daily.  . prochlorperazine (COMPAZINE) 10 MG tablet Take 1 tablet (10 mg total) by mouth every 6 (six) hours as needed (Nausea or vomiting).  . rosuvastatin (CRESTOR) 10 MG tablet Take 10 mg by mouth daily.   No facility-administered encounter medications on file as of 06/18/2017.     ALLERGIES:  Allergies  Allergen Reactions  . Codeine Nausea And Vomiting  . Morphine And Related Nausea  And Vomiting     PHYSICAL EXAM:  ECOG Performance status: 1 I have reviewed his vital signs.   LABORATORY DATA:  I have reviewed the labs as listed.  CBC    Component Value Date/Time   WBC 7.7 06/18/2017 0943   RBC 5.19 06/18/2017 0943   HGB 15.1 06/18/2017 0943   HCT 45.3 06/18/2017 0943   PLT 129 (L) 06/18/2017 0943   MCV 87.3 06/18/2017 0943   MCH 29.1 06/18/2017 0943   MCHC 33.3 06/18/2017 0943   RDW 16.2 (H) 06/18/2017 0943   LYMPHSABS 0.9 06/18/2017 0943   MONOABS 0.4 06/18/2017 0943   EOSABS 0.3 06/18/2017 0943   BASOSABS 0.0 06/18/2017 0943   CMP Latest Ref Rng & Units 06/18/2017 06/03/2017 05/31/2017  Glucose 65 - 99 mg/dL 160(H) 377(H) 265(H)  BUN 6 - 20 mg/dL 8 12 9   Creatinine 0.61 - 1.24 mg/dL 1.07 1.19 0.94  Sodium 135 - 145 mmol/L 140 130(L) 133(L)  Potassium 3.5 - 5.1 mmol/L 4.2 3.6 3.3(L)  Chloride 101 - 111 mmol/L 103 95(L) 102  CO2 22 - 32 mmol/L 27 24 22   Calcium 8.9 - 10.3 mg/dL 9.1 8.3(L) 7.7(L)  Total Protein 6.5 - 8.1 g/dL 7.0 6.2(L) -  Total Bilirubin 0.3 - 1.2 mg/dL 0.9 0.8 -  Alkaline Phos 38 - 126 U/L 109 80 -  AST 15 - 41 U/L 23 20 -  ALT 17 - 63 U/L 16(L) 12(L) -        ASSESSMENT & PLAN:   Malignant neoplasm of cardio-esophageal junction (HCC) 1.  Distal esophageal adenocarcinoma, stage II (UT3 UN 0): -7 cm long, bulky, focally circumferential, with nearby satellite lesion 2 cm distal to the GE junction -Radiation therapy started on 04/19/2017 through 05/26/2017, week 1 of carboplatin and paclitaxel on 04/21/2017, week 2 on 04/28/2017 - Chemotherapy held for the last 2 weeks secondary to thrombocytopenia -His platelet count today has returned back to 120.  Unfortunately he could  not receive more than 2 cycles of chemo with radiation because of thrombocytopenia.  He was evaluated by Dr. Madolyn Frieze who ordered a CT scan in the first week of June.  I will see him back after the CT scan.  He is also being evaluated by cardiology for moderate aortic  stenosis and dysfunction of the left ventricle.  If there is no evidence of metastatic disease on subsequent scan, options include surveillance versus esophagectomy.  He has vomiting and regurgitation when he eats foods like breads and meats.  We have counseled him extensively that he should not eat those foods and mainly eat soft and pured foods.  He is drinking about 2 cans of Glucerna daily.  2.  Diabetes: His blood sugars are better controlled after his appointment with Dr. Dorris Fetch.  We have also counseled him about the variety of foods he can eat.        Orders placed this encounter:  No orders of the defined types were placed in this encounter.    This note includes documentation from Mike Craze, NP, who was present during this patient's office visit and evaluation.  I have reviewed this note for its completeness and accuracy.  I have edited this note accordingly based on my findings and medical opinion.      Derek Jack, MD [place] [phone]

## 2017-06-18 NOTE — Progress Notes (Addendum)
Nutrition Follow-up:  Patient with esophageal cancer extending to fundus.  Patient completed radiation on 4/10 in [facility] in [place].  Has only been able to receive 2 doses of chemotherapy (04/28/17) due to cytopenias.    Met with patient, Langley Gauss and Denise's husband at their request this am following MD appt.  RD has met with patient and Langley Gauss one other time but RD has met with patient multiple times during fluid infusions.  Patient has been eating soft foods, applesauce, drinking 1-2 glucerna per day.  Patient feels weak.  Blood glucose better today and has seen endocrinologist recently.  Langley Gauss reports no change in medication.  Husband reported patient was not taking medication on consistent basis.    Medications: reviewed  Labs: glucose 160  Anthropometrics:   Weight continues to decline 173 lb today decreased from 181 lb 9.6 oz in clinic on 4/12.    UBW 190 lb (10/2016)  5% weight loss in 1 month, significant  9% weight loss in the last 8 months, not significant  Estimated Energy Needs  Kcals: 1900-2000 calories/d Protein: 95-100 g  Fluid: 2.2 L/d  NUTRITION DIAGNOSIS: predicted suboptimal nutrient intake.     MALNUTRITION DIAGNOSIS: Patient now meets criteria for moderate malnutrition in context acute illness as evidenced by 5% weight loss in the last month and eating < 75% of energy needs for > 7 days.   INTERVENTION:  Recommend switching from glucerna (180 calorie) to ensure plus (350 calorie shake) to better meet nutritional needs.  Patient will need to drink 5-6 per day to better meet nutritional needs if unable to eat anything else.   Discussed soft moist protein foods and list of foods provided.   Discussed pureeing, chopping, grinding foods to make them easier to get down.  Encouraged adding gravies, butter, sauces, condiments to foods to increase calories and change consistencies of foods.   Recommend taking blood glucose medication as prescribed and encouraged  checking blood glucose.  Recommend notifying endocrinologist if blood glucose increases and have medication adjusted.  Do not recommend restricting diet at this time as intake limited, many high carb foods patient can tolerate at this time and moderate malnutrition.   Patient given 2nd case of ensure plus today.  If patient continues to be unable to maintain nutrition and aggressive treatment wanted recommend considering feeding tube placement (J-tube if surgical candidate).      MONITORING, EVALUATION, GOAL: weight trends, intake   NEXT VISIT: as needed  Lilliahna Schubring B. Zenia Resides, [place], [place]upe Guerra Registered Dietitian [phone] (pager)

## 2017-06-18 NOTE — Patient Instructions (Signed)
[facility] at [facility] Discharge Instructions  Today you saw Dr. K.   Thank you for choosing [facility] at [facility] to provide your oncology and hematology care.  To afford each patient quality time with our provider, please arrive at least 15 minutes before your scheduled appointment time.   If you have a lab appointment with the Cancer Center please come in thru the  Main Entrance and check in at the main information desk  You need to re-schedule your appointment should you arrive 10 or more minutes late.  We strive to give you quality time with our providers, and arriving late affects you and other patients whose appointments are after yours.  Also, if you no show three or more times for appointments you may be dismissed from the clinic at the providers discretion.     Again, thank you for choosing [facility].  Our hope is that these requests will decrease the amount of time that you wait before being seen by our physicians.       _____________________________________________________________  Should you have questions after your visit to  Cancer Center, please contact our office at [phone] between the hours of 8:30 a.m. and 4:30 p.m.  Voicemails left after 4:30 p.m. will not be returned until the following business day.  For prescription refill requests, have your pharmacy contact our office.       Resources For Cancer Patients and their Caregivers ? American Cancer Society: Can assist with transportation, wigs, general needs, runs Look Good Feel Better.        1-888-227-6333 ? Cancer Care: Provides financial assistance, online support groups, medication/co-pay assistance.  1-800-813-HOPE (4673) ? [facility] Assists [place] cancer patients and their families through emotional , educational and financial support.  336-427-4357 ? [place] DSS Where to apply for food  stamps, Medicaid and utility assistance. 336-342-1394 ? [facility]: Transportation to medical appointments. 336-347-2287 ? Social Security Administration: May apply for disability if have a Stage IV cancer. 336-342-7796 1-800-772-1213 ? [place] Aging, Disability and Transit Services: Assists with nutrition, care and transit needs. 336-349-2343  Cancer Center Support Programs:   > Cancer Support Group  2nd Tuesday of the month 1pm-2pm, Journey Room   > Creative Journey  3rd Tuesday of the month 1130am-1pm, Journey Room    

## 2017-06-18 NOTE — Assessment & Plan Note (Signed)
1.  Distal esophageal adenocarcinoma, stage II (UT3 UN 0): -7 cm long, bulky, focally circumferential, with nearby satellite lesion 2 cm distal to the GE junction -Radiation therapy started on 04/19/2017 through 05/26/2017, week 1 of carboplatin and paclitaxel on 04/21/2017, week 2 on 04/28/2017 - Chemotherapy held for the last 2 weeks secondary to thrombocytopenia -His platelet count today has returned back to 120.  Unfortunately he could not receive more than 2 cycles of chemo with radiation because of thrombocytopenia.  He was evaluated by Dr. Madolyn Frieze who ordered a CT scan in the first week of June.  I will see him back after the CT scan.  He is also being evaluated by cardiology for moderate aortic stenosis and dysfunction of the left ventricle.  If there is no evidence of metastatic disease on subsequent scan, options include surveillance versus esophagectomy.  He has vomiting and regurgitation when he eats foods like breads and meats.  We have counseled him extensively that he should not eat those foods and mainly eat soft and pured foods.  He is drinking about 2 cans of Glucerna daily.  2.  Diabetes: His blood sugars are better controlled after his appointment with Dr. Dorris Fetch.  We have also counseled him about the variety of foods he can eat.

## 2017-06-22 ENCOUNTER — Encounter: Payer: Self-pay | Admitting: "Endocrinology

## 2017-06-22 ENCOUNTER — Ambulatory Visit (INDEPENDENT_AMBULATORY_CARE_PROVIDER_SITE_OTHER): Payer: Medicare Other | Admitting: "Endocrinology

## 2017-06-22 VITALS — BP 127/83 | HR 90 | Ht 64.0 in | Wt 174.0 lb

## 2017-06-22 DIAGNOSIS — E1159 Type 2 diabetes mellitus with other circulatory complications: Secondary | ICD-10-CM

## 2017-06-22 DIAGNOSIS — I10 Essential (primary) hypertension: Secondary | ICD-10-CM | POA: Diagnosis not present

## 2017-06-22 DIAGNOSIS — E782 Mixed hyperlipidemia: Secondary | ICD-10-CM | POA: Diagnosis not present

## 2017-06-22 DIAGNOSIS — I25118 Atherosclerotic heart disease of native coronary artery with other forms of angina pectoris: Secondary | ICD-10-CM | POA: Diagnosis not present

## 2017-06-22 MED ORDER — GLUCOSE BLOOD VI STRP: 1.0000 | ORAL_STRIP | Freq: Four times a day (QID) | 5 refills | Status: AC

## 2017-06-22 MED ORDER — METFORMIN HCL 500 MG PO TABS: 500.0000 mg | ORAL_TABLET | Freq: Two times a day (BID) | ORAL | 2 refills | Status: DC

## 2017-06-22 NOTE — Progress Notes (Signed)
Endocrinology follow-up note       06/22/2017, 2:26 PM   Subjective:    Patient ID: [name], male    DOB: 1950/08/22.  67 is being seen in follow-up for management of currently uncontrolled symptomatic diabetes requested by  Sinda Du, MD.   Past Medical History:  Diagnosis Date  . Arthritis   . Bipolar disorder ([place])   . CAD (coronary artery disease) 08/16/2016  . Cellulitis and abscess of foot 03/05/2016  . Chronic combined systolic and diastolic heart failure ([place])   . Diabetes mellitus without complication ([place])    boderline  . GERD (gastroesophageal reflux disease)   . Heart murmur 1995  . History of kidney stones   . Hypercholesteremia   . Hypertension   . Kidney stones   . Myocardial infarction ([place])   . OSA (obstructive sleep apnea)    no cpap, can't tolerate  . Schizoaffective disorder, bipolar type ([place]) 04/25/2016   Past Surgical History:  Procedure Laterality Date  . APPENDECTOMY    . BIOPSY  03/09/2017   Procedure: BIOPSY;  Surgeon: Danie Binder, MD;  [facility];  Service: Endoscopy;;  gastric esophageal  . CHOLECYSTECTOMY N/A 10/20/2013   Procedure: LAPAROSCOPIC CHOLECYSTECTOMY;  Surgeon: [name], MD;  Location: [facility];  Service: General;  Laterality: N/A;  . COLONOSCOPY WITH PROPOFOL N/A 03/09/2017   Procedure: COLONOSCOPY WITH PROPOFOL;  Surgeon: Danie Binder, MD;  [facility];  Service: Endoscopy;  Laterality: N/A;  12:15pm  . ESOPHAGOGASTRODUODENOSCOPY (EGD) WITH PROPOFOL N/A 03/09/2017   Procedure: ESOPHAGOGASTRODUODENOSCOPY (EGD) WITH PROPOFOL;  Surgeon: Danie Binder, MD;  [facility];  Service: Endoscopy;  Laterality: N/A;  . EUS N/A 04/08/2017   Procedure: UPPER ENDOSCOPIC ULTRASOUND (EUS) RADIAL;  Surgeon: Milus Banister, MD;  Location: [facility];  Service: Endoscopy;  Laterality: N/A;  . HIP  SURGERY Left   . KNEE SURGERY Left   . POLYPECTOMY  03/09/2017   Procedure: POLYPECTOMY;  Surgeon: Danie Binder, MD;  [facility];  Service: Endoscopy;;  colon   . PORTACATH PLACEMENT Left 04/07/2017   Procedure: INSERTION PORT-A-CATH LEFT SUBCLAVIAN;  Surgeon: Virl Cagey, MD;  Location: [facility];  Service: General;  Laterality: Left;  . SAVORY DILATION N/A 03/09/2017   Procedure: SAVORY DILATION;  Surgeon: Danie Binder, MD;  [facility];  Service: Endoscopy;  Laterality: N/A;   Social History   Socioeconomic History  . Marital status: Divorced    Spouse name: Not on file  . Number of children: Not on file  . Years of education: Not on file  . Highest education level: Not on file  Occupational History  . Not on file  Social Needs  . Financial resource strain: Not on file  . Food insecurity:    Worry: Not on file    Inability: Not on file  . Transportation needs:    Medical: Not on file    Non-medical: Not on file  Tobacco Use  . Smoking status: Never Smoker  . Smokeless tobacco: Never Used  Substance and  Sexual Activity  . Alcohol use: No  . Drug use: No  . Sexual activity: Not Currently    Birth control/protection: Implant  Lifestyle  . Physical activity:    Days per week: Not on file    Minutes per session: Not on file  . Stress: Not on file  Relationships  . Social connections:    Talks on phone: Not on file    Gets together: Not on file    Attends religious service: Not on file    Active member of club or organization: Not on file    Attends meetings of clubs or organizations: Not on file    Relationship status: Not on file  Other Topics Concern  . Not on file  Social History Narrative  . Not on file   Outpatient Encounter Medications as of 06/22/2017  Medication Sig  . glucose blood test strip 1 each by Other route 4 (four) times daily. Use as instructed qid One Touch ultra  . [DISCONTINUED] glucose blood test strip 1 each  by Other route 4 (four) times daily. Use as instructed  . acetaminophen (TYLENOL) 325 MG tablet Take 650 mg by mouth every 6 (six) hours as needed.  . AMITIZA 24 MCG capsule Take 24 mcg by mouth 2 (two) times a week.   Marland Kitchen aspirin EC 81 MG tablet Take 81 mg by mouth daily.  . Blood Glucose Monitoring Suppl (ONETOUCH VERIO) w/Device KIT 1 each by Does not apply route as needed.  Marland Kitchen CARBOPLATIN IV Inject into the vein.  Marland Kitchen dexamethasone (DECADRON) 4 MG tablet Take 2 tablets (8 mg total) by mouth daily. Start the day after chemotherapy for 2 days.  Marland Kitchen dexlansoprazole (DEXILANT) 60 MG capsule Take 1 capsule (60 mg total) by mouth daily.  Marland Kitchen ENTRESTO 49-51 MG TAKE 1 TABLET BY MOUTH TWICE DAILY  . furosemide (LASIX) 40 MG tablet Alternate 40 mg one day and 20 mg the next (Patient taking differently: 40 mg daily. )  . lidocaine (XYLOCAINE) 2 % solution   . lidocaine-prilocaine (EMLA) cream Apply a quarter size amount to affected area 1 hour prior to coming to chemotherapy. Do not rub in. Cover with plastic wrap.  . linagliptin (TRADJENTA) 5 MG TABS tablet Take 5 mg by mouth daily.  . magic mouthwash w/lidocaine SOLN Take 5 mLs by mouth 4 (four) times daily as needed for mouth pain.  . metFORMIN (GLUCOPHAGE) 500 MG tablet Take 1 tablet (500 mg total) by mouth 2 (two) times daily with a meal.  . OLANZapine (ZYPREXA) 5 MG tablet Take 5 mg by mouth 2 (two) times daily.  . ondansetron (ZOFRAN ODT) 8 MG disintegrating tablet Take 1 tablet (8 mg total) by mouth every 8 (eight) hours as needed for nausea or vomiting.  Marland Kitchen oxyCODONE (ROXICODONE) 5 MG immediate release tablet Take 1 tablet (5 mg total) by mouth every 4 (four) hours as needed.  Marland Kitchen PACLitaxel (TAXOL IV) Inject into the vein.  . potassium chloride SA (K-DUR,KLOR-CON) 20 MEQ tablet Take 20 mEq by mouth daily.  . prochlorperazine (COMPAZINE) 10 MG tablet Take 1 tablet (10 mg total) by mouth every 6 (six) hours as needed (Nausea or vomiting).  . rosuvastatin  (CRESTOR) 10 MG tablet Take 10 mg by mouth daily.  . [DISCONTINUED] glucose blood (ONETOUCH VERIO) test strip Use as instructed 4 x daily. E11.65   No facility-administered encounter medications on file as of 06/22/2017.     ALLERGIES: Allergies  Allergen Reactions  . Codeine Nausea  And Vomiting  . Morphine And Related Nausea And Vomiting    VACCINATION STATUS: Immunization History  Administered Date(s) Administered  . Pneumococcal Polysaccharide-23 03/06/2016    Diabetes  He presents for his follow-up diabetic visit. He has type 2 diabetes mellitus. Onset time: He was diagnosed at approximate age of 89 years. His disease course has been improving. There are no hypoglycemic associated symptoms. Pertinent negatives for hypoglycemia include no confusion, headaches, pallor or seizures. Associated symptoms include polydipsia and polyuria. Pertinent negatives for diabetes include no chest pain, no fatigue, no polyphagia and no weakness. There are no hypoglycemic complications. Symptoms are improving. Diabetic complications include heart disease and nephropathy. Risk factors for coronary artery disease include diabetes mellitus, dyslipidemia, hypertension, male sex, family history and sedentary lifestyle. Current diabetic treatment includes oral agent (monotherapy). His weight is stable. He is following a generally unhealthy diet. When asked about meal planning, he reported none. He has not had a previous visit with a dietitian. He rarely participates in exercise. His breakfast blood glucose range is generally >200 mg/dl. His lunch blood glucose range is generally 180-200 mg/dl. His dinner blood glucose range is generally 180-200 mg/dl. His bedtime blood glucose range is generally >200 mg/dl. His overall blood glucose range is >200 mg/dl. (  )  Hyperlipidemia  This is a chronic problem. The current episode started more than 1 year ago. The problem is uncontrolled. Recent lipid tests were reviewed and  are high. Exacerbating diseases include chronic renal disease and diabetes. Pertinent negatives include no chest pain, myalgias or shortness of breath. Current antihyperlipidemic treatment includes statins. Risk factors for coronary artery disease include diabetes mellitus, dyslipidemia, hypertension, male sex and a sedentary lifestyle.  Hypertension  This is a chronic problem. The current episode started more than 1 year ago. The problem is controlled. Pertinent negatives include no chest pain, headaches, neck pain, palpitations or shortness of breath. Risk factors for coronary artery disease include dyslipidemia, diabetes mellitus, family history, male gender and sedentary lifestyle. Identifiable causes of hypertension include chronic renal disease.      Review of Systems  Constitutional: Negative for chills, fatigue, fever and unexpected weight change.  HENT: Negative for dental problem, mouth sores and trouble swallowing.   Eyes: Negative for visual disturbance.  Respiratory: Negative for cough, choking, chest tightness, shortness of breath and wheezing.   Cardiovascular: Negative for chest pain, palpitations and leg swelling.  Gastrointestinal: Negative for abdominal distention, abdominal pain, constipation, diarrhea, nausea and vomiting.  Endocrine: Positive for polydipsia and polyuria. Negative for polyphagia.  Genitourinary: Negative for dysuria, flank pain, hematuria and urgency.  Musculoskeletal: Negative for back pain, gait problem, myalgias and neck pain.  Skin: Negative for pallor, rash and wound.  Neurological: Negative for seizures, syncope, weakness, numbness and headaches.       Hard of hearing.  Psychiatric/Behavioral: Negative for confusion and dysphoric mood.    Objective:    BP 127/83   Pulse 90   Ht 5' 4"  (1.626 m)   Wt 174 lb (78.9 kg)   BMI 29.87 kg/m   Wt Readings from Last 3 Encounters:  06/22/17 174 lb (78.9 kg)  06/18/17 173 lb 1.6 oz (78.5 kg)  06/10/17  178 lb (80.7 kg)     Physical Exam  Constitutional: He is oriented to person, place, and time. He appears well-developed and well-nourished. He is cooperative. No distress.  HENT:  Head: Normocephalic and atraumatic.  Eyes: EOM are normal.  Neck: Normal range of motion. Neck supple. No  tracheal deviation present. No thyromegaly present.  Cardiovascular: Normal rate, S1 normal, S2 normal and normal heart sounds. Exam reveals no gallop.  No murmur heard. Pulses:      Dorsalis pedis pulses are 1+ on the right side, and 1+ on the left side.       Posterior tibial pulses are 1+ on the right side, and 1+ on the left side.  Pulmonary/Chest: Effort normal. No respiratory distress. He has no wheezes.  Abdominal: He exhibits no distension. There is no tenderness. There is no guarding and no CVA tenderness.  Musculoskeletal: He exhibits no edema.       Right shoulder: He exhibits no swelling and no deformity.  Neurological: He is alert and oriented to person, place, and time. He has normal strength and normal reflexes. A sensory deficit is present. No cranial nerve deficit. Gait normal.  Patient is hard of hearing.  Skin: Skin is warm and dry. No rash noted. No cyanosis. Nails show no clubbing.  Psychiatric: He has a normal mood and affect. His speech is normal. Cognition and memory are normal.  Patient with significant cognitive deficit, he has positive effect.  His cousin is offering help.     CMP ( most recent) CMP     Component Value Date/Time   NA 140 06/18/2017 0943   K 4.2 06/18/2017 0943   CL 103 06/18/2017 0943   CO2 27 06/18/2017 0943   GLUCOSE 160 (H) 06/18/2017 0943   BUN 8 06/18/2017 0943   CREATININE 1.07 06/18/2017 0943   CREATININE 1.13 05/28/2017 1330   CALCIUM 9.1 06/18/2017 0943   PROT 7.0 06/18/2017 0943   ALBUMIN 3.4 (L) 06/18/2017 0943   AST 23 06/18/2017 0943   ALT 16 (L) 06/18/2017 0943   ALKPHOS 109 06/18/2017 0943   BILITOT 0.9 06/18/2017 0943   GFRNONAA  >60 06/18/2017 0943   GFRNONAA 67 05/28/2017 1330   GFRAA >60 06/18/2017 0943   GFRAA 78 05/28/2017 1330     Diabetic Labs (most recent): Lab Results  Component Value Date   HGBA1C 11.2 (H) 05/28/2017   HGBA1C 8.0 (H) 08/16/2016    Lipid Panel     Component Value Date/Time   CHOL 182 08/17/2016 0517   TRIG 186 (H) 08/17/2016 0517   HDL 31 (L) 08/17/2016 0517   CHOLHDL 5.9 08/17/2016 0517   VLDL 37 08/17/2016 0517   LDLCALC 114 (H) 08/17/2016 0517      Lab Results  Component Value Date   TSH 0.65 05/28/2017   TSH 1.045 08/16/2016   TSH 1.396 03/05/2016   FREET4 1.3 05/28/2017      Assessment & Plan:   1. DM type 2 causing vascular disease ([place])  - Armarion B Santilli has currently uncontrolled symptomatic type 2 DM since 67 years of age. -He came with his meter and logs showing improved glycemic profile, average between 200-236.  His recent labs show A1c of 11.2%.   - recent CMP is reviewed showing improving renal function.      -his diabetes is complicated by coronary artery disease, CKD and Sherrill B Matsumoto remains at a high risk for more acute and chronic complications which include CAD, CVA, CKD, retinopathy, and neuropathy. These are all discussed in detail with the patient.  - I have counseled him on diet management and weight loss, by adopting a carbohydrate restricted/protein rich diet.  -  Suggestion is made for him to avoid simple carbohydrates  from his diet including Cakes, Sweet Desserts /  Pastries, Ice Cream, Soda (diet and regular), Sweet Tea, Candies, Chips, Cookies, Store Bought Juices, Alcohol in Excess of  1-2 drinks a day, Artificial Sweeteners, and "Sugar-free" Products. This will help patient to have stable blood glucose profile and potentially avoid unintended weight gain.   - I encouraged him to switch to  unprocessed or minimally processed complex starch and increased protein intake (animal or plant source), fruits, and vegetables.  - he is  advised to stick to a routine mealtimes to eat 3 meals  a day and avoid unnecessary snacks ( to snack only to correct hypoglycemia).   - he will be scheduled with Jearld Fenton, RDN, CDE for individualized diabetes education.  - I have approached him with the following individualized plan to manage diabetes and patient agrees:   -Based on his current significant glycemic burden, he would benefit from at least basal insulin to achieve glycemic control.   -Considering his evident cognitive deficit, inadequate social support, treatment regimen should be simple as possible.   - Patient  is at risk of hypoglycemia if given insulin without enough supervision. -I discussed and initiated low-dose metformin at 500 mg p.o. twice daily with meals along with his Tradjenta 5 mg p.o. Daily in hope of keeping his treatment regimen simple.   -Patient is encouraged to call clinic for blood glucose levels less than 70 or above 300 mg /dl.  -It is unlikely for him to be able to execute insulin treatment.  If enough support cannot be arranged for him to obtain at home, he should be considered for placement in group home for intensive treatment with insulin. -He will continue to monitor blood glucose 2 times daily-before breakfast and at bedtime.  - Patient specific target  A1c;  LDL, HDL, Triglycerides, and  Waist Circumference were discussed in detail.  2) BP/HTN: His blood pressure is controlled to target.  Advised him to continue his current blood pressure medications.    3) Lipids/HPL:   Controlled with LDL of 114,   Patient is advised to continue statins. 4)  Weight/Diet: CDE Consult will be initiated , exercise, and detailed carbohydrates information provided.  5) Chronic Care/Health Maintenance:  -he  Is on Statin medications and  is encouraged to continue to follow up with Ophthalmology, Dentist,  Podiatrist at least yearly or according to recommendations, and advised to  stay away from smoking. I  have recommended yearly flu vaccine and pneumonia vaccination at least every 5 years; moderate intensity exercise for up to 150 minutes weekly; and  sleep for at least 7 hours a day.  - I advised patient to maintain close follow up with Sinda Du, MD for primary care needs.  - Time spent with the patient: 25 min, of which >50% was spent in reviewing his blood glucose logs , discussing his hypo- and hyper-glycemic episodes, reviewing his current and  previous labs and insulin doses and developing a plan to avoid hypo- and hyper-glycemia. Please refer to Patient Instructions for Blood Glucose Monitoring and Insulin/Medications Dosing Guide"  in media tab for additional information. Ella Jubilee participated in the discussions, expressed understanding, and voiced agreement with the above plans.  All questions were answered to his satisfaction. he is encouraged to contact clinic should he have any questions or concerns prior to his return visit.   Follow up plan: - Return in about 3 months (around 09/22/2017) for follow up with pre-visit labs.  Glade Lloyd, MD [facility] [address]  [address] [place], [place] [address] Phone: [phone]  Fax: [phone]    06/22/2017, 2:26 PM  This note was partially dictated with voice recognition software. Similar sounding words can be transcribed inadequately or may not  be corrected upon review.

## 2017-06-23 ENCOUNTER — Ambulatory Visit: Admission: RE | Admit: 2017-06-23 | Payer: Medicare Other | Source: Ambulatory Visit | Admitting: Radiation Oncology

## 2017-06-24 DIAGNOSIS — E11319 Type 2 diabetes mellitus with unspecified diabetic retinopathy without macular edema: Secondary | ICD-10-CM | POA: Diagnosis not present

## 2017-06-28 ENCOUNTER — Other Ambulatory Visit: Payer: Self-pay

## 2017-06-28 ENCOUNTER — Ambulatory Visit
Admission: RE | Admit: 2017-06-28 | Discharge: 2017-06-28 | Disposition: A | Discharge status: Home / Self Care | Payer: Medicare Other | Source: Ambulatory Visit | Attending: Radiation Oncology | Admitting: Radiation Oncology

## 2017-06-28 ENCOUNTER — Encounter: Payer: Self-pay | Admitting: Radiation Oncology

## 2017-06-28 VITALS — BP 152/98 | HR 79 | Temp 97.8°F | Resp 20 | Ht 64.0 in | Wt 174.8 lb

## 2017-06-28 DIAGNOSIS — Z79899 Other long term (current) drug therapy: Secondary | ICD-10-CM | POA: Diagnosis not present

## 2017-06-28 DIAGNOSIS — C155 Malignant neoplasm of lower third of esophagus: Secondary | ICD-10-CM | POA: Insufficient documentation

## 2017-06-28 DIAGNOSIS — Z7984 Long term (current) use of oral hypoglycemic drugs: Secondary | ICD-10-CM | POA: Diagnosis not present

## 2017-06-28 DIAGNOSIS — Z923 Personal history of irradiation: Secondary | ICD-10-CM | POA: Insufficient documentation

## 2017-06-28 DIAGNOSIS — Z7982 Long term (current) use of aspirin: Secondary | ICD-10-CM | POA: Insufficient documentation

## 2017-06-28 DIAGNOSIS — C159 Malignant neoplasm of esophagus, unspecified: Secondary | ICD-10-CM

## 2017-06-28 NOTE — Progress Notes (Signed)
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   Narrative:  The patient returns today for routine follow-up. During treatment she did very well with radiotherapy and did not have significant desquamation. He is scheduled for repeat imaging with CT on 07/19/17. He will proceed with evaluation as well with cardiology and follow up with med onc in [place] as well as with Dr. Servando Snare.                          On review of systems, the patient states he's doing pretty well. He does have 1-2 times a week where he has regurgitation but has been succesful at eating meats and even steak. He denies nausea, esophagitis or burning symptoms with swallowing. No other complaints are verbalized.  ALLERGIES:  is allergic to codeine and morphine and related.  Meds: Current Outpatient Medications  Medication Sig Dispense Refill  . acetaminophen (TYLENOL) 325 MG tablet Take 650 mg by mouth every 6 (six) hours as needed.    . AMITIZA 24 MCG capsule Take 24 mcg by mouth 2 (two) times a week.     Marland Kitchen aspirin EC 81 MG tablet Take 81 mg by mouth daily.    . Blood Glucose Monitoring Suppl (ONETOUCH VERIO) w/Device KIT 1 each by Does not apply route as needed. 1 kit 0  . ENTRESTO 49-51 MG TAKE 1 TABLET BY MOUTH TWICE DAILY 60 tablet 0  . furosemide (LASIX) 40 MG tablet Alternate 40 mg one day and 20 mg the next (Patient taking differently: 40 mg daily. ) 90 tablet 3  . lidocaine (XYLOCAINE) 2 %  solution     . linagliptin (TRADJENTA) 5 MG TABS tablet Take 5 mg by mouth daily.    . metFORMIN (GLUCOPHAGE) 500 MG tablet Take 1 tablet (500 mg total) by mouth 2 (two) times daily with a meal. 60 tablet 2  . OLANZapine (ZYPREXA) 5 MG tablet Take 5 mg by mouth 2 (two) times daily.    . ondansetron (ZOFRAN ODT) 8 MG disintegrating tablet Take 1 tablet (8 mg total) by mouth every 8 (eight) hours as needed for nausea or vomiting. 40 tablet 2  . potassium chloride SA (K-DUR,KLOR-CON) 20 MEQ tablet Take 20 mEq by mouth daily.    . prochlorperazine (COMPAZINE) 10 MG tablet Take 1 tablet (10 mg total) by mouth every 6 (six) hours as needed (Nausea or vomiting). 30 tablet 1  . rosuvastatin (CRESTOR) 10 MG tablet Take 10 mg by mouth daily.    Marland Kitchen CARBOPLATIN IV Inject into the vein.    Marland Kitchen dexamethasone (DECADRON) 4 MG tablet Take 2 tablets (8 mg total) by mouth daily. Start the day after chemotherapy for 2 days. 30 tablet 1  . dexlansoprazole (DEXILANT) 60 MG capsule Take 1 capsule (60 mg total) by mouth daily. 90 capsule 3  . glucose blood test strip 1 each by Other route 4 (  four) times daily. Use as instructed qid One Touch ultra 150 each 5  . lidocaine-prilocaine (EMLA) cream Apply a quarter size amount to affected area 1 hour prior to coming to chemotherapy. Do not rub in. Cover with plastic wrap. (Patient not taking: Reported on 06/28/2017) 30 g 2  . magic mouthwash w/lidocaine SOLN Take 5 mLs by mouth 4 (four) times daily as needed for mouth pain. (Patient not taking: Reported on 06/28/2017) 600 mL 1  . oxyCODONE (ROXICODONE) 5 MG immediate release tablet Take 1 tablet (5 mg total) by mouth every 4 (four) hours as needed. (Patient not taking: Reported on 06/28/2017) 10 tablet 0  . PACLitaxel (TAXOL IV) Inject into the vein.     No current facility-administered medications for this encounter.     Physical Findings:  height is 5' 4"  (1.626 m) and weight is 174 lb 12.8 oz (79.3 kg). His oral temperature is  97.8 F (36.6 C). His blood pressure is 152/98 (abnormal) and his pulse is 79. His respiration is 20 and oxygen saturation is 100%.   /10 In general this is a well appearing caucasian male in no acute distress. He's alert and oriented x4 and appropriate throughout the examination. Cardiopulmonary assessment is negative for acute distress and he exhibits normal effort.   Lab Findings: Lab Results  Component Value Date   WBC 7.7 06/18/2017   HGB 15.1 06/18/2017   HCT 45.3 06/18/2017   MCV 87.3 06/18/2017   PLT 129 (L) 06/18/2017     Radiographic Findings: No results found.  Impression/Plan: 1. 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 The patient has been doing well since completion of radiotherapy. We discussed that we would be happy to continue to follow himas needed, but he will also continue to follow up with Dr. Delton Coombes in medical oncology and with Dr. Servando Snare. He was also advised to try to spread out his meals through the day and focus on softer foods.     Carola Rhine, PAC

## 2017-07-19 ENCOUNTER — Ambulatory Visit (HOSPITAL_COMMUNITY)
Admission: RE | Admit: 2017-07-19 | Discharge: 2017-07-19 | Disposition: A | Discharge status: Home / Self Care | Payer: Medicare Other | Source: Ambulatory Visit | Attending: Cardiothoracic Surgery | Admitting: Cardiothoracic Surgery

## 2017-07-19 DIAGNOSIS — C155 Malignant neoplasm of lower third of esophagus: Secondary | ICD-10-CM | POA: Diagnosis not present

## 2017-07-19 DIAGNOSIS — I700 Atherosclerosis of aorta: Secondary | ICD-10-CM | POA: Diagnosis not present

## 2017-07-19 DIAGNOSIS — Z9221 Personal history of antineoplastic chemotherapy: Secondary | ICD-10-CM | POA: Diagnosis not present

## 2017-07-19 DIAGNOSIS — Z923 Personal history of irradiation: Secondary | ICD-10-CM | POA: Insufficient documentation

## 2017-07-19 DIAGNOSIS — Z9049 Acquired absence of other specified parts of digestive tract: Secondary | ICD-10-CM | POA: Insufficient documentation

## 2017-07-19 DIAGNOSIS — C159 Malignant neoplasm of esophagus, unspecified: Secondary | ICD-10-CM

## 2017-07-19 MED ORDER — IOPAMIDOL (ISOVUE-300) INJECTION 61%
100.0000 mL | Freq: Once | INTRAVENOUS | Status: AC | PRN
  Administered 2017-07-19: 100 mL via INTRAVENOUS

## 2017-07-20 ENCOUNTER — Ambulatory Visit (INDEPENDENT_AMBULATORY_CARE_PROVIDER_SITE_OTHER): Payer: Medicare Other | Admitting: Cardiovascular Disease

## 2017-07-20 ENCOUNTER — Encounter: Payer: Self-pay | Admitting: Cardiovascular Disease

## 2017-07-20 VITALS — BP 128/68 | HR 87 | Ht 64.0 in | Wt 179.0 lb

## 2017-07-20 DIAGNOSIS — G4733 Obstructive sleep apnea (adult) (pediatric): Secondary | ICD-10-CM | POA: Diagnosis not present

## 2017-07-20 DIAGNOSIS — I25118 Atherosclerotic heart disease of native coronary artery with other forms of angina pectoris: Secondary | ICD-10-CM

## 2017-07-20 DIAGNOSIS — C159 Malignant neoplasm of esophagus, unspecified: Secondary | ICD-10-CM

## 2017-07-20 DIAGNOSIS — I429 Cardiomyopathy, unspecified: Secondary | ICD-10-CM

## 2017-07-20 DIAGNOSIS — I350 Nonrheumatic aortic (valve) stenosis: Secondary | ICD-10-CM | POA: Diagnosis not present

## 2017-07-20 DIAGNOSIS — I10 Essential (primary) hypertension: Secondary | ICD-10-CM | POA: Diagnosis not present

## 2017-07-20 DIAGNOSIS — I351 Nonrheumatic aortic (valve) insufficiency: Secondary | ICD-10-CM | POA: Diagnosis not present

## 2017-07-20 DIAGNOSIS — I5042 Chronic combined systolic (congestive) and diastolic (congestive) heart failure: Secondary | ICD-10-CM | POA: Diagnosis not present

## 2017-07-20 MED ORDER — CARVEDILOL 3.125 MG PO TABS: 3.1250 mg | ORAL_TABLET | Freq: Two times a day (BID) | ORAL | 3 refills | Status: DC

## 2017-07-20 NOTE — Patient Instructions (Signed)
Your physician wants you to follow-up in:  6 months with Dr.Koneswaran You will receive a reminder letter in the mail two months in advance. If you don't receive a letter, please call our office to schedule the follow-up appointment.     START Coreg 3.125 mg twice a day    If you need a refill on your cardiac medications before your next appointment, please call your pharmacy.    Your physician has requested that you have an echocardiogram. Echocardiography is a painless test that uses sound waves to create images of your heart. It provides your doctor with information about the size and shape of your heart and how well your heart's chambers and valves are working. This procedure takes approximately one hour. There are no restrictions for this procedure.     No lab work ordered today.      Thank you for choosing [facility] !

## 2017-07-20 NOTE — Progress Notes (Signed)
SUBJECTIVE: The patient presents for routine follow-up.  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  He has received radiation and chemotherapy.  He needs a surgical resection and is here for preoperative risk assessment.  CT of the abdomen pelvis was performed yesterday which I reviewed.  It demonstrated persistent moderate irregular circumferential wall thickening in the lower thoracic esophagus which is decreased.  There was a new left lower lobe 4 mm pulmonary nodule which was indeterminate.  He has chronic combined systolic and diastolic heart failure. He also has accelerated hypertension, obstructive sleep apnea and is noncompliant with CPAP, and coronary artery disease with a prior history of MI.  His most recent echocardiogram performed on 10/27/16 showed an improvement in left reticular systolic function, LVEF 85-02%, grade 1 diastolic dysfunction, with moderate aortic stenosis and mild to moderate aortic regurgitation.  He denies exertional chest pain and shortness of breath.  He does have some exertional fatigue and feels slightly weak.  He denies orthopnea, leg swelling, and paroxysmal nocturnal dyspnea.  He tries to walk 6 laps around [place] mall but is only 2 complete 4 laps.  I reviewed labs performed on 06/18/2017: White blood cell 7.7, hemoglobin 15.1, platelets mildly low at 129, sodium 140, potassium 4.2, BUN 8, creatinine 1.07.      Review of Systems: As per "subjective", otherwise negative.  Allergies  Allergen Reactions  . Codeine Nausea And Vomiting  . Morphine And Related Nausea And Vomiting    Current Outpatient Medications  Medication Sig Dispense Refill  . acetaminophen (TYLENOL) 325 MG tablet Take 650 mg by mouth every 6 (six) hours as needed.    . AMITIZA 24 MCG capsule Take 24 mcg by mouth 2 (two) times a week.     Marland Kitchen aspirin EC 81 MG tablet Take 81 mg by mouth daily.    . Blood  Glucose Monitoring Suppl (ONETOUCH VERIO) w/Device KIT 1 each by Does not apply route as needed. 1 kit 0  . CARBOPLATIN IV Inject into the vein.    Marland Kitchen dexamethasone (DECADRON) 4 MG tablet Take 2 tablets (8 mg total) by mouth daily. Start the day after chemotherapy for 2 days. 30 tablet 1  . dexlansoprazole (DEXILANT) 60 MG capsule Take 1 capsule (60 mg total) by mouth daily. 90 capsule 3  . ENTRESTO 49-51 MG TAKE 1 TABLET BY MOUTH TWICE DAILY 60 tablet 0  . furosemide (LASIX) 40 MG tablet Alternate 40 mg one day and 20 mg the next (Patient taking differently: 40 mg daily. ) 90 tablet 3  . glucose blood test strip 1 each by Other route 4 (four) times daily. Use as instructed qid One Touch ultra 150 each 5  . lidocaine (XYLOCAINE) 2 % solution     . lidocaine-prilocaine (EMLA) cream Apply a quarter size amount to affected area 1 hour prior to coming to chemotherapy. Do not rub in. Cover with plastic wrap. 30 g 2  . linagliptin (TRADJENTA) 5 MG TABS tablet Take 5 mg by mouth daily.    . magic mouthwash w/lidocaine SOLN Take 5 mLs by mouth 4 (four) times daily as needed for mouth pain. 600 mL 1  . metFORMIN (GLUCOPHAGE) 500 MG tablet Take 1 tablet (500 mg total) by mouth 2 (two) times daily with a meal. 60 tablet 2  . OLANZapine (ZYPREXA) 5 MG tablet Take 5 mg by mouth 2 (two) times daily.    Marland Kitchen  ondansetron (ZOFRAN ODT) 8 MG disintegrating tablet Take 1 tablet (8 mg total) by mouth every 8 (eight) hours as needed for nausea or vomiting. 40 tablet 2  . oxyCODONE (ROXICODONE) 5 MG immediate release tablet Take 1 tablet (5 mg total) by mouth every 4 (four) hours as needed. 10 tablet 0  . PACLitaxel (TAXOL IV) Inject into the vein.    . potassium chloride SA (K-DUR,KLOR-CON) 20 MEQ tablet Take 20 mEq by mouth daily.    . prochlorperazine (COMPAZINE) 10 MG tablet Take 1 tablet (10 mg total) by mouth every 6 (six) hours as needed (Nausea or vomiting). 30 tablet 1  . rosuvastatin (CRESTOR) 10 MG tablet Take 10  mg by mouth daily.     No current facility-administered medications for this visit.     Past Medical History:  Diagnosis Date  . Arthritis   . Bipolar disorder ([place])   . CAD (coronary artery disease) 08/16/2016  . Cellulitis and abscess of foot 03/05/2016  . Chronic combined systolic and diastolic heart failure ([place])   . Diabetes mellitus without complication ([place])    boderline  . GERD (gastroesophageal reflux disease)   . Heart murmur 1995  . History of kidney stones   . Hypercholesteremia   . Hypertension   . Kidney stones   . Myocardial infarction ([place])   . OSA (obstructive sleep apnea)    no cpap, can't tolerate  . Schizoaffective disorder, bipolar type ([place]) 04/25/2016    Past Surgical History:  Procedure Laterality Date  . APPENDECTOMY    . BIOPSY  03/09/2017   Procedure: BIOPSY;  Surgeon: Danie Binder, MD;  [facility];  Service: Endoscopy;;  gastric esophageal  . CHOLECYSTECTOMY N/A 10/20/2013   Procedure: LAPAROSCOPIC CHOLECYSTECTOMY;  Surgeon: [name], MD;  Location: [facility];  Service: General;  Laterality: N/A;  . COLONOSCOPY WITH PROPOFOL N/A 03/09/2017   Procedure: COLONOSCOPY WITH PROPOFOL;  Surgeon: Danie Binder, MD;  [facility];  Service: Endoscopy;  Laterality: N/A;  12:15pm  . ESOPHAGOGASTRODUODENOSCOPY (EGD) WITH PROPOFOL N/A 03/09/2017   Procedure: ESOPHAGOGASTRODUODENOSCOPY (EGD) WITH PROPOFOL;  Surgeon: Danie Binder, MD;  [facility];  Service: Endoscopy;  Laterality: N/A;  . EUS N/A 04/08/2017   Procedure: UPPER ENDOSCOPIC ULTRASOUND (EUS) RADIAL;  Surgeon: Milus Banister, MD;  [facility];  Service: Endoscopy;  Laterality: N/A;  . HIP SURGERY Left   . KNEE SURGERY Left   . POLYPECTOMY  03/09/2017   Procedure: POLYPECTOMY;  Surgeon: Danie Binder, MD;  [facility];  Service: Endoscopy;;  colon   . PORTACATH PLACEMENT Left 04/07/2017   Procedure: INSERTION PORT-A-CATH LEFT  SUBCLAVIAN;  Surgeon: Virl Cagey, MD;  Location: [facility];  Service: General;  Laterality: Left;  . SAVORY DILATION N/A 03/09/2017   Procedure: SAVORY DILATION;  Surgeon: Danie Binder, MD;  [facility];  Service: Endoscopy;  Laterality: N/A;    Social History   Socioeconomic History  . Marital status: Divorced    Spouse name: Not on file  . Number of children: Not on file  . Years of education: Not on file  . Highest education level: Not on file  Occupational History  . Not on file  Social Needs  . Financial resource strain: Not on file  . Food insecurity:    Worry: Not on file    Inability: Not on file  . Transportation needs:    Medical: Not on file  Non-medical: Not on file  Tobacco Use  . Smoking status: Never Smoker  . Smokeless tobacco: Never Used  Substance and Sexual Activity  . Alcohol use: No  . Drug use: No  . Sexual activity: Not Currently    Birth control/protection: Implant  Lifestyle  . Physical activity:    Days per week: Not on file    Minutes per session: Not on file  . Stress: Not on file  Relationships  . Social connections:    Talks on phone: Not on file    Gets together: Not on file    Attends religious service: Not on file    Active member of club or organization: Not on file    Attends meetings of clubs or organizations: Not on file    Relationship status: Not on file  . Intimate partner violence:    Fear of current or ex partner: Not on file    Emotionally abused: Not on file    Physically abused: Not on file    Forced sexual activity: Not on file  Other Topics Concern  . Not on file  Social History Narrative  . Not on file     Vitals:   07/20/17 1114  BP: 128/68  Pulse: 87  SpO2: 97%  Weight: 179 lb (81.2 kg)  Height: 5' 4"  (1.626 m)    Wt Readings from Last 3 Encounters:  07/20/17 179 lb (81.2 kg)  06/28/17 174 lb 12.8 oz (79.3 kg)  06/22/17 174 lb (78.9 kg)     PHYSICAL EXAM General: NAD HEENT:  Normal. Neck: No JVD, no thyromegaly. Lungs: Clear to auscultation bilaterally with normal respiratory effort. CV: Regular rate and rhythm, normal S1/S2, no I3/B0, 3/6 systolicmurmur over RUSB and along LSB. No pretibial or periankle edema.     Abdomen: Soft, nontender, no distention.  Neurologic: Alert and oriented.  Psych: Normal affect. Skin: Normal. Musculoskeletal: No gross deformities.    ECG: Most recent ECG reviewed.   Labs: Lab Results  Component Value Date/Time   K 4.2 06/18/2017 09:43 AM   BUN 8 06/18/2017 09:43 AM   CREATININE 1.07 06/18/2017 09:43 AM   CREATININE 1.13 05/28/2017 01:30 PM   ALT 16 (L) 06/18/2017 09:43 AM   TSH 0.65 05/28/2017 01:30 PM   HGB 15.1 06/18/2017 09:43 AM     Lipids: Lab Results  Component Value Date/Time   LDLCALC 114 (H) 08/17/2016 05:17 AM   CHOL 182 08/17/2016 05:17 AM   TRIG 186 (H) 08/17/2016 05:17 AM   HDL 31 (L) 08/17/2016 05:17 AM       ASSESSMENT AND PLAN:  1.Chronic systolic and diastolic heart failure withmild to moderately reducedleft ventricular systolic dysfunction, LVEF 40-45%: He appears to be euvolemic on Lasix 40 mg daily. Continue Entresto49/96mtwice daily.  He is no longer on carvedilol.  I will resume at 3.125 mg twice daily.  I will also obtain a follow-up echocardiogram. Nuclear stress test showed significant myocardial scar.  2. Hypertension: Controlled.    I will monitor given the reinitiation of carvedilol 3.125 mg twice daily.  3. OSA: Noncompliant with CPAP.  4. CAD with prior MWU:[id number]. Nuclear stress test results detailed above no evidence of ischemia. I willcontinueaspirin 81 mg and Crestor 10 mg.  Continue ARB.  He is no longer on carvedilol.  I will resume at 3.125 mg twice daily.  5. Cardiomyopathy:Most recent echocardiogram detailed above demonstrated an improvement in LVEF to 40-45%. Medical therapy as noted above.  I will obtain a  follow-up echocardiogram to  assess for interval changes.  6. Aortic stenosis and regurgitation: Moderate aortic stenosis and moderate aortic regurgitation as demonstrated by echocardiogram in September 2018.  I will obtain a follow-up echocardiogram to assess for interval changes.  7.  Esophageal adenocarcinoma with metastasis/preoperative risk stratification: He may require surgical resection.  This will be determined by CT surgery and oncology.  CT of the abdomen and pelvis performed yesterday reviewed above.  He is certainly at an elevated risk for major adverse cardiac event in the perioperative period.  I do not feel he requires another nuclear stress test.  I will obtain an echocardiogram to evaluate cardiac structure and function.  I am reinitiating carvedilol 3.125 mg twice daily which may help to attenuate perioperative risk.     Disposition: Follow up 6 months   Kate Sable, M.D., F.A.C.C.

## 2017-07-20 NOTE — Progress Notes (Signed)
c 

## 2017-07-21 ENCOUNTER — Other Ambulatory Visit: Payer: Self-pay

## 2017-07-21 ENCOUNTER — Inpatient Hospital Stay (HOSPITAL_COMMUNITY): Payer: Medicare Other | Attending: Internal Medicine | Admitting: Hematology

## 2017-07-21 ENCOUNTER — Encounter (HOSPITAL_COMMUNITY): Payer: Self-pay | Admitting: Hematology

## 2017-07-21 ENCOUNTER — Other Ambulatory Visit: Payer: Self-pay | Admitting: Cardiovascular Disease

## 2017-07-21 VITALS — BP 162/74 | HR 83 | Temp 97.8°F | Resp 18 | Wt 181.5 lb

## 2017-07-21 DIAGNOSIS — C160 Malignant neoplasm of cardia: Secondary | ICD-10-CM | POA: Diagnosis not present

## 2017-07-21 DIAGNOSIS — Z923 Personal history of irradiation: Secondary | ICD-10-CM | POA: Diagnosis not present

## 2017-07-21 DIAGNOSIS — M129 Arthropathy, unspecified: Secondary | ICD-10-CM | POA: Diagnosis not present

## 2017-07-21 DIAGNOSIS — K219 Gastro-esophageal reflux disease without esophagitis: Secondary | ICD-10-CM | POA: Diagnosis not present

## 2017-07-21 DIAGNOSIS — Z87442 Personal history of urinary calculi: Secondary | ICD-10-CM | POA: Diagnosis not present

## 2017-07-21 DIAGNOSIS — F250 Schizoaffective disorder, bipolar type: Secondary | ICD-10-CM | POA: Diagnosis not present

## 2017-07-21 DIAGNOSIS — Z7984 Long term (current) use of oral hypoglycemic drugs: Secondary | ICD-10-CM | POA: Insufficient documentation

## 2017-07-21 DIAGNOSIS — I5042 Chronic combined systolic (congestive) and diastolic (congestive) heart failure: Secondary | ICD-10-CM | POA: Insufficient documentation

## 2017-07-21 DIAGNOSIS — I2510 Atherosclerotic heart disease of native coronary artery without angina pectoris: Secondary | ICD-10-CM | POA: Insufficient documentation

## 2017-07-21 DIAGNOSIS — R5383 Other fatigue: Secondary | ICD-10-CM | POA: Insufficient documentation

## 2017-07-21 DIAGNOSIS — Z79899 Other long term (current) drug therapy: Secondary | ICD-10-CM | POA: Insufficient documentation

## 2017-07-21 DIAGNOSIS — E7800 Pure hypercholesterolemia, unspecified: Secondary | ICD-10-CM | POA: Diagnosis not present

## 2017-07-21 DIAGNOSIS — Z9221 Personal history of antineoplastic chemotherapy: Secondary | ICD-10-CM | POA: Insufficient documentation

## 2017-07-21 DIAGNOSIS — R011 Cardiac murmur, unspecified: Secondary | ICD-10-CM | POA: Diagnosis not present

## 2017-07-21 DIAGNOSIS — G4730 Sleep apnea, unspecified: Secondary | ICD-10-CM | POA: Insufficient documentation

## 2017-07-21 DIAGNOSIS — R918 Other nonspecific abnormal finding of lung field: Secondary | ICD-10-CM | POA: Insufficient documentation

## 2017-07-21 DIAGNOSIS — I252 Old myocardial infarction: Secondary | ICD-10-CM | POA: Insufficient documentation

## 2017-07-21 DIAGNOSIS — Z7982 Long term (current) use of aspirin: Secondary | ICD-10-CM | POA: Insufficient documentation

## 2017-07-21 DIAGNOSIS — Z803 Family history of malignant neoplasm of breast: Secondary | ICD-10-CM | POA: Diagnosis not present

## 2017-07-21 DIAGNOSIS — E119 Type 2 diabetes mellitus without complications: Secondary | ICD-10-CM | POA: Diagnosis not present

## 2017-07-21 DIAGNOSIS — I10 Essential (primary) hypertension: Secondary | ICD-10-CM | POA: Diagnosis not present

## 2017-07-21 NOTE — Progress Notes (Signed)
 [facility] [address] [place], [place] [address]   CLINIC:  Medical Oncology/Hematology  PCP:  Hawkins, Edward, MD [address] [place] [address] [phone]   REASON FOR VISIT:  Follow-up for GE junction adenocarcinoma.  CURRENT THERAPY: Post chemoradiation therapy surveillance.   INTERVAL HISTORY:  [name] 67 y.o. male returns for follow-up visit 6 to 8 weeks after completion of chemoradiation therapy.  He reports that he is able to swallow better.  He is able to eat hamburgers and meats.  He is able to eat most kinds of breads.  He is eating 1 main meal a day and snacks on chips, crackers and pudding.  Fatigue is also improving.  Denies any fevers or infections.   REVIEW OF SYSTEMS:  Review of Systems  Constitutional: Positive for fatigue.  HENT:   Positive for trouble swallowing.   All other systems reviewed and are negative.    PAST MEDICAL/SURGICAL HISTORY:  Past Medical History:  Diagnosis Date  . Arthritis   . Bipolar disorder (HCC)   . CAD (coronary artery disease) 08/16/2016  . Cellulitis and abscess of foot 03/05/2016  . Chronic combined systolic and diastolic heart failure (HCC)   . Diabetes mellitus without complication (HCC)    boderline  . GERD (gastroesophageal reflux disease)   . Heart murmur 1995  . History of kidney stones   . Hypercholesteremia   . Hypertension   . Kidney stones   . Myocardial infarction (H[facility])   . OSA (obstructive sleep apnea)    no cpap, can't tolerate  . Schizoaffective disorder, bipolar type (H[facility]) 04/25/2016   Past Surgical History:  Procedure Laterality Date  . APPENDECTOMY    . BIOPSY  03/09/2017   Procedure: BIOPSY;  Surgeon: Fields, Sandi L, MD;  [facility];  Service: Endoscopy;;  gastric esophageal  . CHOLECYSTECTOMY N/A 10/20/2013   Procedure: LAPAROSCOPIC CHOLECYSTECTOMY;  Surgeon: Mark A Jenkins, MD;  [facility]: [facility];  Service: General;  Laterality: N/A;  .  COLONOSCOPY WITH PROPOFOL N/A 03/09/2017   Procedure: COLONOSCOPY WITH PROPOFOL;  Surgeon: Fields, Sandi L, MD;  [facility];  Service: Endoscopy;  Laterality: N/A;  12:15pm  . ESOPHAGOGASTRODUODENOSCOPY (EGD) WITH PROPOFOL N/A 03/09/2017   Procedure: ESOPHAGOGASTRODUODENOSCOPY (EGD) WITH PROPOFOL;  Surgeon: Fields, Sandi L, MD;  [facility];  Service: Endoscopy;  Laterality: N/A;  . EUS N/A 04/08/2017   Procedure: UPPER ENDOSCOPIC ULTRASOUND (EUS) RADIAL;  Surgeon: Jacobs, Daniel P, MD;  [facility];  Service: Endoscopy;  Laterality: N/A;  . HIP SURGERY Left   . KNEE SURGERY Left   . POLYPECTOMY  03/09/2017   Procedure: POLYPECTOMY;  Surgeon: Fields, Sandi L, MD;  [facility];  Service: Endoscopy;;  colon   . PORTACATH PLACEMENT Left 04/07/2017   Procedure: INSERTION PORT-A-CATH LEFT SUBCLAVIAN;  Surgeon: Bridges, Lindsay C, MD;  [facility]: [facility];  Service: General;  Laterality: Left;  . SAVORY DILATION N/A 03/09/2017   Procedure: SAVORY DILATION;  Surgeon: Fields, Sandi L, MD;  [facility];  Service: Endoscopy;  Laterality: N/A;     SOCIAL HISTORY:  Social History   Socioeconomic History  . Marital status: Divorced    Spouse name: Not on file  . Number of children: Not on file  . Years of education: Not on file  . Highest education level: Not on file  Occupational History  . Not on file  Social Needs  . Financial   resource strain: Not on file  . Food insecurity:    Worry: Not on file    Inability: Not on file  . Transportation needs:    Medical: Not on file    Non-medical: Not on file  Tobacco Use  . Smoking status: Never Smoker  . Smokeless tobacco: Never Used  Substance and Sexual Activity  . Alcohol use: No  . Drug use: No  . Sexual activity: Not Currently    Birth control/protection: Implant  Lifestyle  . Physical activity:    Days per week: Not on file    Minutes per session: Not on file  . Stress: Not on file   Relationships  . Social connections:    Talks on phone: Not on file    Gets together: Not on file    Attends religious service: Not on file    Active member of club or organization: Not on file    Attends meetings of clubs or organizations: Not on file    Relationship status: Not on file  . Intimate partner violence:    Fear of current or ex partner: Not on file    Emotionally abused: Not on file    Physically abused: Not on file    Forced sexual activity: Not on file  Other Topics Concern  . Not on file  Social History Narrative  . Not on file    FAMILY HISTORY:  Family History  Problem Relation Age of Onset  . Hypertension Mother   . Dementia Mother   . Cancer Mother        breast  . Colon cancer Neg Hx   . Colon polyps Neg Hx     CURRENT MEDICATIONS:  Outpatient Encounter Medications as of 07/21/2017  Medication Sig  . acetaminophen (TYLENOL) 325 MG tablet Take 650 mg by mouth every 6 (six) hours as needed.  . AMITIZA 24 MCG capsule Take 24 mcg by mouth 2 (two) times a week.   Marland Kitchen aspirin EC 81 MG tablet Take 81 mg by mouth daily.  . Blood Glucose Monitoring Suppl (ONETOUCH VERIO) w/Device KIT 1 each by Does not apply route as needed.  Marland Kitchen CARBOPLATIN IV Inject into the vein.  . carvedilol (COREG) 3.125 MG tablet Take 1 tablet (3.125 mg total) by mouth 2 (two) times daily.  Marland Kitchen dexamethasone (DECADRON) 4 MG tablet Take 2 tablets (8 mg total) by mouth daily. Start the day after chemotherapy for 2 days.  Marland Kitchen dexlansoprazole (DEXILANT) 60 MG capsule Take 1 capsule (60 mg total) by mouth daily.  Marland Kitchen ENTRESTO 49-51 MG TAKE 1 TABLET BY MOUTH TWICE DAILY  . furosemide (LASIX) 40 MG tablet Alternate 40 mg one day and 20 mg the next (Patient taking differently: 40 mg daily. )  . glucose blood test strip 1 each by Other route 4 (four) times daily. Use as instructed qid One Touch ultra  . lidocaine (XYLOCAINE) 2 % solution   . lidocaine-prilocaine (EMLA) cream Apply a quarter size amount  to affected area 1 hour prior to coming to chemotherapy. Do not rub in. Cover with plastic wrap.  . linagliptin (TRADJENTA) 5 MG TABS tablet Take 5 mg by mouth daily.  . magic mouthwash w/lidocaine SOLN Take 5 mLs by mouth 4 (four) times daily as needed for mouth pain.  . metFORMIN (GLUCOPHAGE) 500 MG tablet Take 1 tablet (500 mg total) by mouth 2 (two) times daily with a meal.  . OLANZapine (ZYPREXA) 5 MG tablet Take 5 mg by mouth  2 (two) times daily.  . ondansetron (ZOFRAN ODT) 8 MG disintegrating tablet Take 1 tablet (8 mg total) by mouth every 8 (eight) hours as needed for nausea or vomiting.  Marland Kitchen oxyCODONE (ROXICODONE) 5 MG immediate release tablet Take 1 tablet (5 mg total) by mouth every 4 (four) hours as needed.  Marland Kitchen PACLitaxel (TAXOL IV) Inject into the vein.  . potassium chloride SA (K-DUR,KLOR-CON) 20 MEQ tablet Take 20 mEq by mouth daily.  . prochlorperazine (COMPAZINE) 10 MG tablet Take 1 tablet (10 mg total) by mouth every 6 (six) hours as needed (Nausea or vomiting).  . rosuvastatin (CRESTOR) 10 MG tablet Take 10 mg by mouth daily.   No facility-administered encounter medications on file as of 07/21/2017.     ALLERGIES:  Allergies  Allergen Reactions  . Codeine Nausea And Vomiting  . Morphine And Related Nausea And Vomiting     PHYSICAL EXAM:  ECOG Performance status: 1  Vitals:   07/21/17 1431  BP: (!) 162/74  Pulse: 83  Resp: 18  Temp: 97.8 F (36.6 C)  SpO2: 98%   Filed Weights   07/21/17 1431  Weight: 181 lb 8 oz (82.3 kg)    Physical Exam Deferred.  LABORATORY DATA:  I have reviewed the labs as listed.  CBC    Component Value Date/Time   WBC 7.7 06/18/2017 0943   RBC 5.19 06/18/2017 0943   HGB 15.1 06/18/2017 0943   HCT 45.3 06/18/2017 0943   PLT 129 (L) 06/18/2017 0943   MCV 87.3 06/18/2017 0943   MCH 29.1 06/18/2017 0943   MCHC 33.3 06/18/2017 0943   RDW 16.2 (H) 06/18/2017 0943   LYMPHSABS 0.9 06/18/2017 0943   MONOABS 0.4 06/18/2017 0943    EOSABS 0.3 06/18/2017 0943   BASOSABS 0.0 06/18/2017 0943   CMP Latest Ref Rng & Units 06/18/2017 06/03/2017 05/31/2017  Glucose 65 - 99 mg/dL 160(H) 377(H) 265(H)  BUN 6 - 20 mg/dL _0 Creatinine 0.61 - 1.24 mg/dL 1.07 1.19 0.94  Sodium 135 - 145 mmol/L 140 130(L) 133(L)  Potassium 3.5 - 5.1 mmol/L 4.2 3.6 3.3(L)  Chloride 101 - 111 mmol/L 103 95(L) 102  CO2 22 - 32 mmol/L _1 Calcium 8.9 - 10.3 mg/dL 9.1 8.3(L) 7.7(L)  Total Protein 6.5 - 8.1 g/dL 7.0 6.2(L) -  Total Bilirubin 0.3 - 1.2 mg/dL 0.9 0.8 -  Alkaline Phos 38 - 126 U/L 109 80 -  AST 15 - 41 U/L 23 20 -  ALT 17 - 63 U/L 16(L) 12(L) -       DIAGNOSTIC IMAGING:  I have independently reviewed the images of the CT scan of the chest, abdomen and pelvis dated 07/19/2017 and discussed the results with the patient..      ASSESSMENT & PLAN:   Malignant neoplasm of cardio-esophageal junction (HCC) 1.  Distal esophageal adenocarcinoma, stage II (UT3 UN 0): -7 cm long, bulky, focally circumferential, with nearby satellite lesion 2 cm distal to the GE junction -Radiation therapy started on 04/19/2017 through 05/26/2017, week 1 of carboplatin and paclitaxel on 04/21/2017, week 2 on 04/28/2017 - Chemotherapy held for the last 2 weeks secondary to thrombocytopenia -He reports that his swallowing ability has improved since completing chemoradiation therapy.  He is able to eat hamburgers and even steak.  He has problem with some kind of breads.  He snacks on chips, crackers and pudding.  He is able to eat 1 big meal a day.  He has not  lost any weight but gained about 2 pounds. -I have reviewed the results of the CT scan of the chest, abdomen and pelvis dated 07/19/2017 which showed nonspecific persistent moderate irregular circumferential wall thickening in the lower thoracic esophagus, decreased from previous scans.  This could be post treatment changes versus residual tumor.  There is a new 4 mm left lower lobe pulmonary nodule,  indeterminate.  No other evidence of metastatic disease.  He has a follow-up with Dr. Gerhardt tomorrow.  If he is not a surgical candidate, there is a role for upper GI endoscopy and biopsy.  I will schedule him for repeat CT scans in 3 months.         Orders placed this encounter:  Orders Placed This Encounter  Procedures  . CT Abdomen Pelvis W Contrast  . CT Chest W Contrast  . CBC with Differential  . Comprehensive metabolic panel       , MD [facility] [phone]  

## 2017-07-21 NOTE — Assessment & Plan Note (Signed)
1.  Distal esophageal adenocarcinoma, stage II (UT3 UN 0): -7 cm long, bulky, focally circumferential, with nearby satellite lesion 2 cm distal to the GE junction -Radiation therapy started on 04/19/2017 through 05/26/2017, week 1 of carboplatin and paclitaxel on 04/21/2017, week 2 on 04/28/2017 - Chemotherapy held for the last 2 weeks secondary to thrombocytopenia -He reports that his swallowing ability has improved since completing chemoradiation therapy.  He is able to eat hamburgers and even steak.  He has problem with some kind of breads.  He snacks on chips, crackers and pudding.  He is able to eat 1 big meal a day.  He has not lost any weight but gained about 2 pounds. -I have reviewed the results of the CT scan of the chest, abdomen and pelvis dated 07/19/2017 which showed nonspecific persistent moderate irregular circumferential wall thickening in the lower thoracic esophagus, decreased from previous scans.  This could be post treatment changes versus residual tumor.  There is a new 4 mm left lower lobe pulmonary nodule, indeterminate.  No other evidence of metastatic disease.  He has a follow-up with Dr. Servando Snare tomorrow.  If he is not a surgical candidate, there is a role for upper GI endoscopy and biopsy.  I will schedule him for repeat CT scans in 3 months.

## 2017-07-22 ENCOUNTER — Ambulatory Visit (INDEPENDENT_AMBULATORY_CARE_PROVIDER_SITE_OTHER): Payer: Medicare Other | Admitting: Cardiothoracic Surgery

## 2017-07-22 VITALS — BP 130/92 | HR 90 | Resp 20 | Ht 64.0 in | Wt 177.0 lb

## 2017-07-22 DIAGNOSIS — C160 Malignant neoplasm of cardia: Secondary | ICD-10-CM

## 2017-07-22 DIAGNOSIS — I25118 Atherosclerotic heart disease of native coronary artery with other forms of angina pectoris: Secondary | ICD-10-CM | POA: Diagnosis not present

## 2017-07-22 NOTE — Progress Notes (Signed)
[address]       [place] [address]             [phone]                    [name] [facility] Medical Record #[id number] Date of Birth: March 19, 1950  Referring: Creola Corn, MD Primary Care: Sinda Du, MD Primary Cardiologist: Kate Sable, MD  Chief Complaint:    Chief Complaint  Patient presents with  . Esophageal Cancer    6 week f/u with CT A/A/P 07/19/17    History of Present Illness:    [name] 67 y.o. 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  Restaging CT scan of the chest and abdomen was done on June 3.  Patient is also been seen by cardiology follow-up echocardiogram is pending.   The patient notes symptoms started in June 2018 having increasing trouble eating.  He noted this progressed to the point where he was only able to eat ice cream.  He denies any specific weight loss.  Notes that he would have episodes of rapid vomiting after he eats.  In February 2019 he underwent endoscopy showing a distal esophageal lesion follow-up upper GI endoscopy with EUS was performed February 22.  The patient is started on course of radiation and chemotherapy for distal esophageal cancer.  Radiation therapy started on 04/19/2017 through 06/02/2017, week 1 of carboplatin and paclitaxel on 04/21/2017, week 2 on 04/28/2017.  Patient was unable to complete his courses of chemotherapy because of low platelet count down to the 40,000s.  He completed a course of radiation therapy on Wednesday, April 17.  He continues to take a soft and liquid diet.  He is able to take solid food.  He has gained some weight.  He has moderate aortic stenosis was severe LV dysfunction question of previous anterior myocardial infarction no previous cardiac catheterization has been done.  Previous echocardiograms have had ejection fraction fluctuating from 20 to 45%  depending on the timing of the study.  Patient comes into the office today under his own mobility but appears very frail, he has some trouble ambulating and even getting on the exam table.   Current Activity/ Functional Status:  Patient is independent with mobility/ambulation, transfers, ADL's, IADL's.   Zubrod Score: At the time of surgery this patient's most appropriate activity status/level should be described as: _0     0    Normal activity, no symptoms _1     1    Restricted in physical strenuous activity but ambulatory, able to do out light work _2     2    Ambulatory and capable of self care, unable to do work activities, up and about               >50 % of waking hours                              _3     3    Only limited self care, in bed greater than 50% of waking hours _4     4    Completely disabled, no self care, confined to bed or chair _5     5    Moribund   Past Medical History:  Diagnosis Date  . Arthritis   .  Bipolar disorder ([place])   . CAD (coronary artery disease) 08/16/2016  . Cellulitis and abscess of foot 03/05/2016  . Chronic combined systolic and diastolic heart failure ([place])   . Diabetes mellitus without complication ([place])    boderline  . GERD (gastroesophageal reflux disease)   . Heart murmur 1995  . History of kidney stones   . Hypercholesteremia   . Hypertension   . Kidney stones   . Myocardial infarction ([place])   . OSA (obstructive sleep apnea)    no cpap, can't tolerate  . Schizoaffective disorder, bipolar type ([place]) 04/25/2016    Past Surgical History:  Procedure Laterality Date  . APPENDECTOMY    . BIOPSY  03/09/2017   Procedure: BIOPSY;  Surgeon: Danie Binder, MD;  [facility];  Service: Endoscopy;;  gastric esophageal  . CHOLECYSTECTOMY N/A 10/20/2013   Procedure: LAPAROSCOPIC CHOLECYSTECTOMY;  Surgeon: [name], MD;  Location: [facility];  Service: General;  Laterality: N/A;  . COLONOSCOPY WITH PROPOFOL N/A 03/09/2017    Procedure: COLONOSCOPY WITH PROPOFOL;  Surgeon: Danie Binder, MD;  [facility];  Service: Endoscopy;  Laterality: N/A;  12:15pm  . ESOPHAGOGASTRODUODENOSCOPY (EGD) WITH PROPOFOL N/A 03/09/2017   Procedure: ESOPHAGOGASTRODUODENOSCOPY (EGD) WITH PROPOFOL;  Surgeon: Danie Binder, MD;  [facility];  Service: Endoscopy;  Laterality: N/A;  . EUS N/A 04/08/2017   Procedure: UPPER ENDOSCOPIC ULTRASOUND (EUS) RADIAL;  Surgeon: Milus Banister, MD;  Location: [facility];  Service: Endoscopy;  Laterality: N/A;  . HIP SURGERY Left   . KNEE SURGERY Left   . POLYPECTOMY  03/09/2017   Procedure: POLYPECTOMY;  Surgeon: Danie Binder, MD;  [facility];  Service: Endoscopy;;  colon   . PORTACATH PLACEMENT Left 04/07/2017   Procedure: INSERTION PORT-A-CATH LEFT SUBCLAVIAN;  Surgeon: Virl Cagey, MD;  Location: [facility];  Service: General;  Laterality: Left;  . SAVORY DILATION N/A 03/09/2017   Procedure: SAVORY DILATION;  Surgeon: Danie Binder, MD;  [facility];  Service: Endoscopy;  Laterality: N/A;    Family History  Problem Relation Age of Onset  . Hypertension Mother   . Dementia Mother   . Cancer Mother        breast  . Colon cancer Neg Hx   . Colon polyps Neg Hx     Social History   Socioeconomic History  . Marital status: Divorced    Social History   Tobacco Use  Smoking Status Never Smoker  Smokeless Tobacco Never Used    Social History   Substance and Sexual Activity  Alcohol Use No     Allergies  Allergen Reactions  . Codeine Nausea And Vomiting  . Morphine And Related Nausea And Vomiting    Current Outpatient Medications  Medication Sig Dispense Refill  . acetaminophen (TYLENOL) 325 MG tablet Take 650 mg by mouth every 6 (six) hours as needed.    . AMITIZA 24 MCG capsule Take 24 mcg by mouth 2 (two) times a week.     Marland Kitchen aspirin EC 81 MG tablet Take 81 mg by mouth daily.    . Blood Glucose Monitoring Suppl  (ONETOUCH VERIO) w/Device KIT 1 each by Does not apply route as needed. 1 kit 0  . carvedilol (COREG) 3.125 MG tablet Take 1 tablet (3.125 mg total) by mouth 2 (two) times daily. 180 tablet 3  . dexlansoprazole (DEXILANT) 60 MG capsule Take 1 capsule (60 mg total) by mouth daily. [place]  capsule 3  . ENTRESTO 49-51 MG TAKE 1 TABLET BY MOUTH TWICE DAILY 180 tablet 3  . furosemide (LASIX) 40 MG tablet Alternate 40 mg one day and 20 mg the next (Patient taking differently: 40 mg daily. ) 90 tablet 3  . glucose blood test strip 1 each by Other route 4 (four) times daily. Use as instructed qid One Touch ultra 150 each 5  . linagliptin (TRADJENTA) 5 MG TABS tablet Take 5 mg by mouth daily.    . magic mouthwash w/lidocaine SOLN Take 5 mLs by mouth 4 (four) times daily as needed for mouth pain. 600 mL 1  . metFORMIN (GLUCOPHAGE) 500 MG tablet Take 1 tablet (500 mg total) by mouth 2 (two) times daily with a meal. 60 tablet 2  . OLANZapine (ZYPREXA) 5 MG tablet Take 5 mg by mouth 2 (two) times daily.    . ondansetron (ZOFRAN ODT) 8 MG disintegrating tablet Take 1 tablet (8 mg total) by mouth every 8 (eight) hours as needed for nausea or vomiting. 40 tablet 2  . oxyCODONE (ROXICODONE) 5 MG immediate release tablet Take 1 tablet (5 mg total) by mouth every 4 (four) hours as needed. 10 tablet 0  . potassium chloride SA (K-DUR,KLOR-CON) 20 MEQ tablet Take 20 mEq by mouth daily.    . prochlorperazine (COMPAZINE) 10 MG tablet Take 1 tablet (10 mg total) by mouth every 6 (six) hours as needed (Nausea or vomiting). 30 tablet 1  . rosuvastatin (CRESTOR) 10 MG tablet Take 10 mg by mouth daily.    Marland Kitchen CARBOPLATIN IV Inject into the vein.    Marland Kitchen dexamethasone (DECADRON) 4 MG tablet Take 2 tablets (8 mg total) by mouth daily. Start the day after chemotherapy for 2 days. (Patient not taking: Reported on 07/22/2017) 30 tablet 1  . lidocaine (XYLOCAINE) 2 % solution     . lidocaine-prilocaine (EMLA) cream Apply a quarter size amount  to affected area 1 hour prior to coming to chemotherapy. Do not rub in. Cover with plastic wrap. (Patient not taking: Reported on 07/22/2017) 30 g 2  . PACLitaxel (TAXOL IV) Inject into the vein.     No current facility-administered medications for this visit.     Pertinent items are noted in HPI.   Review of Systems:  Review of Systems  Constitutional: Positive for malaise/fatigue and weight loss. Negative for chills and diaphoresis.  HENT: Negative.   Eyes: Negative.   Respiratory: Positive for shortness of breath. Negative for cough, hemoptysis, sputum production and wheezing.   Cardiovascular: Positive for palpitations, orthopnea and leg swelling. Negative for chest pain, claudication and PND.  Gastrointestinal: Positive for nausea. Negative for abdominal pain, blood in stool, constipation, diarrhea, heartburn, melena and vomiting.  Genitourinary: Negative.   Musculoskeletal: Negative.   Skin: Negative.   Neurological: Positive for weakness. Negative for focal weakness, seizures and loss of consciousness.  Endo/Heme/Allergies: Negative.   Psychiatric/Behavioral: Negative.    Physical Exam: BP (!) 130/92   Pulse 90   Resp 20   Ht _0  (1.626 m)   Wt 177 lb (80.3 kg)   SpO2 99% Comment: RA  BMI 30.38 kg/m   PHYSICAL EXAMINATION: General appearance: alert, cooperative, appears older than stated age, fatigued and no distress Head: Normocephalic, without obvious abnormality, atraumatic Neck: no adenopathy, no carotid bruit, no JVD, supple, symmetrical, trachea midline and thyroid not enlarged, symmetric, no tenderness/mass/nodules Lymph nodes: Cervical, supraclavicular, and axillary nodes normal. Resp: clear to auscultation bilaterally Cardio: systolic murmur: early systolic  2/6, crescendo at lower left sternal border and diastolic murmur: mid diastolic 2/6, decrescendo at lower left sternal border GI: soft, non-tender; bowel sounds normal; no masses,  no  organomegaly Extremities: extremities normal, atraumatic, no cyanosis or edema and Homans sign is negative, no sign of DVT Neurologic: Grossly normal   Diagnostic Studies & Laboratory data:     Recent Radiology Findings:     Result Date: 07/19/2017 CLINICAL DATA:  Lower esophageal adenocarcinoma status post chemotherapy and radiation therapy completed 05/26/2017. Restaging. EXAM: CT CHEST, ABDOMEN, AND PELVIS WITH CONTRAST TECHNIQUE: Multidetector CT imaging of the chest, abdomen and pelvis was performed following the standard protocol during bolus administration of intravenous contrast. CONTRAST:  155m ISOVUE-300 IOPAMIDOL (ISOVUE-300) INJECTION 61% COMPARISON:  03/24/2017 PET-CT. 03/12/2017 CT chest, abdomen and pelvis. FINDINGS: CT CHEST FINDINGS Cardiovascular: Normal heart size. No significant pericardial effusion/thickening. Left subclavian MediPort terminates in the lower third of the superior vena cava. Left anterior descending coronary atherosclerosis. Atherosclerotic nonaneurysmal thoracic aorta. Normal caliber pulmonary arteries. No central pulmonary emboli. Mediastinum/Nodes: No discrete thyroid nodules. Persistent moderate irregular circumferential wall thickening in the lower thoracic esophagus (series 2/image 38), decreased since 03/12/2017 CT. Oral contrast throughout the thoracic esophagus. No pathologically enlarged axillary, mediastinal or hilar lymph nodes. Lungs/Pleura: No pneumothorax. No pleural effusion. New 4 mm medial left lower lobe pulmonary nodule (series 3/image 70). No acute consolidative airspace disease, lung masses or additional significant pulmonary nodules. Musculoskeletal: No aggressive appearing focal osseous lesions. Moderate thoracic spondylosis. CT ABDOMEN PELVIS FINDINGS Hepatobiliary: Normal liver with no liver mass. Cholecystectomy. No biliary ductal dilatation. Pancreas: Normal, with no mass or duct dilation. Spleen: Normal size. No mass. Adrenals/Urinary  Tract: Normal adrenals. Stable asymmetric moderate left renal atrophy. No hydronephrosis. Scattered subcentimeter hypodense renal cortical lesions in both kidneys are too small to characterize and unchanged, probably benign. No new renal lesions. Normal bladder. Stomach/Bowel: Normal non-distended stomach. Normal caliber small bowel with no small bowel wall thickening. Appendectomy. Normal large bowel with no diverticulosis, large bowel wall thickening or pericolonic fat stranding. Vascular/Lymphatic: Atherosclerotic abdominal aorta with stable ectatic 2.7 cm infrarenal abdominal aorta. Patent portal, splenic, hepatic and renal veins. No pathologically enlarged lymph nodes in the abdomen or pelvis. Reproductive: Stable mildly enlarged prostate. Other: No pneumoperitoneum, ascites or focal fluid collection. Small fat containing umbilical hernia. Musculoskeletal: No aggressive appearing focal osseous lesions. Mild lumbar spondylosis. IMPRESSION: 1. Nonspecific persistent moderate irregular circumferential wall thickening in the lower thoracic esophagus, decreased, which could represent post treatment changes and/or residual tumor. 2. New 4 mm left lower lobe pulmonary nodule, indeterminate, solitary metastasis not excluded. No additional potential findings of metastatic disease. Recommend attention on follow-up chest CT in 3 months. 3. Chronic findings include: Aortic Atherosclerosis (ICD10-I70.0). Stable ectatic infrarenal abdominal aorta, at risk for aneurysm development. Recommend follow-up aortic ultrasound in 5 years. This recommendation follows ACR consensus guidelines: White Paper of the ACR Incidental Findings Committee II on Vascular Findings. J Am Coll Radiol 2013; 10:789-794. Electronically Signed   By: JIlona SorrelM.D.   On: 07/19/2017 13:19   CLINICAL DATA:  Initial. Treatment strategy for esophageal adenocarcinoma.  EXAM: NUCLEAR MEDICINE PET SKULL BASE TO THIGH  TECHNIQUE: 12.7 mCi F-18 FDG  was injected intravenously. Full-ring PET imaging was performed from the skull base to thigh after the radiotracer. CT data was obtained and used for attenuation correction and anatomic localization.  FASTING BLOOD GLUCOSE:  Value: 187 mg/dl  COMPARISON:  None.  FINDINGS: NECK: No hypermetabolic lymph nodes in the  neck.  CHEST: Mild cardiac enlargement. LAD coronary artery calcifications noted.  The trachea appears patent and midline. Distal esophageal mass is identified measuring 7.6 cm in length within SUV max equal to 15.68. This extends from the level of the left inferior pulmonary vein to just above the diaphragmatic hiatus.  No hypermetabolic mediastinal lymph nodes or hilar lymph nodes identified. No axillary or supraclavicular adenopathy.  No pleural effusions identified.  No pulmonary nodules or masses.  ABDOMEN/PELVIS: No abnormal hypermetabolic activity within the liver, pancreas, adrenal glands, or spleen. No hypermetabolic lymph nodes in the abdomen or pelvis. Left-sided inferior vena cava. Aortic atherosclerosis.  SKELETON: No focal hypermetabolic activity to suggest skeletal metastasis.  IMPRESSION: 1. Mass involving the distal esophagus measures approximately 7.6 cm in length and exhibits intense FDG uptake compatible with primary esophageal neoplasm. 2. No evidence for solid organ metastasis and no hypermetabolic lymph nodes identified. 3. Aortic Atherosclerosis (ICD10-I70.0). Lad coronary artery calcifications noted.   Electronically Signed   By: Kerby Moors M.D.   On: 03/24/2017 12:54 CLINICAL DATA:  Neoplasm of digestive system: Gastric, staging.  EXAM: CT CHEST, ABDOMEN, AND PELVIS WITH CONTRAST  TECHNIQUE: Multidetector CT imaging of the chest, abdomen and pelvis was performed following the standard protocol during bolus administration of intravenous contrast.  CONTRAST:  129m ISOVUE-300 IOPAMIDOL (ISOVUE-300) INJECTION  61%  COMPARISON:  CT urogram 12/15/2007. Reports from colonoscopy and upper endoscopy 03/09/2017. Esophageal and gastric adenocarcinoma on biopsies.  FINDINGS: CT CHEST FINDINGS  Cardiovascular: Mild atherosclerosis of the aorta, great vessels and coronary arteries. No acute vascular findings on noncontrast imaging. Probable aortic valvular calcifications. The heart size is normal. There is no pericardial effusion.  Mediastinum/Nodes: There are no enlarged mediastinal, hilar or axillary lymph nodes. The thyroid gland and trachea appear normal. There is an irregular mass involving the distal 3rd of the esophagus, extending approximately 8 cm in length on sagittal image 116. No mediastinal extension of tumor, significant proximal esophageal distension or adjacent lymphadenopathy.  Lungs/Pleura: No pleural effusion or pneumothorax. The lungs are essentially clear without suspicious pulmonary nodules.  Musculoskeletal/Chest wall: No chest wall mass or suspicious osseous findings.  CT ABDOMEN AND PELVIS FINDINGS  Hepatobiliary: The liver is normal in density without focal abnormality. Previous cholecystectomy without significant biliary dilatation.  Pancreas: Unremarkable. No pancreatic ductal dilatation or surrounding inflammatory changes.  Spleen: Normal in size without focal abnormality.  Adrenals/Urinary Tract: Both adrenal glands appear normal. Chronic asymmetric renal cortical thinning on the left. There are small renal cysts bilaterally. No evidence of renal mass, hydronephrosis or ureteral calculus. The bladder appears normal.  Stomach/Bowel: As above, there is a distal esophageal mass which extends to the gastroesophageal junction. No definite mass in the gastric cardia or fundus. The stomach is incompletely distended and suboptimally evaluated. No evidence of bowel wall thickening, significant distention or surrounding inflammation. Moderate  stool throughout the colon. Probable previous appendectomy.  Vascular/Lymphatic: There are no enlarged abdominal or pelvic lymph nodes. There are small celiac and porta hepatis lymph nodes, measuring up to 9 on image 55. There is diffuse aortic and branch vessel atherosclerosis with intimal irregularity in the abdominal aorta. No acute vascular findings.  Reproductive: The prostate gland and seminal vesicles demonstrate no significant findings.  Other: Small umbilical hernia containing only fat.  No ascites.  Musculoskeletal: No acute or significant osseous findings. Presumed postsurgical changes in the left iliac crest, stable. Mild lumbar spondylosis.  IMPRESSION: 1. Large intraluminal mass in the distal esophagus consistent with adenocarcinoma. No  esophageal obstruction, mediastinal extension of tumor or definite metastatic disease. No definite extension of tumor in the stomach visualized. 2. Small upper abdominal lymph nodes, nonspecific. No enlarged lymph nodes identified. 3. Chronic left renal cortical thinning. 4.  Aortic Atherosclerosis (ICD10-I70.0).   Electronically Signed   By: Richardean Sale M.D.   On: 03/12/2017 16:07   I have independently reviewed the above radiology studies  and reviewed the findings with the patient.  Endoscopic Finding Findings: 1. Friable, nearly obstructing, large mass, circumferential (at most distal aspect) with proximal edge located 32cm from the incisors and distal edge located at 39cm (just below the GE junction). I was able to advance the large diameter (1cm) radial echoendoscope throught the malignant stricture with minor resistence. 2. 1.5cm firm nodule, ulcerated, clearly malignant, located 2cm distal to the GE junction in the cardia of the stomach. 3. Otherwise normal examination. Endosonographic Finding 1. The GE junction mass described above correlated with a bulky, hypoechoic, heterogeneous mas that clearly  passed into and through the muscularis proprial layer (at the distal site of circumferential growth). (uT3). 2. I could not evaluate the satellite lesion due to it's very proximal gastric location. 3. No paraesophaegal adenopathy. (uN0). 4. Limited views of liver, pancreas were no  Recent Lab Findings: Lab Results  Component Value Date   WBC 7.7 06/18/2017   HGB 15.1 06/18/2017   HCT 45.3 06/18/2017   PLT 129 (L) 06/18/2017   GLUCOSE 160 (H) 06/18/2017   CHOL 182 08/17/2016   TRIG 186 (H) 08/17/2016   HDL 31 (L) 08/17/2016   LDLCALC 114 (H) 08/17/2016   ALT 16 (L) 06/18/2017   AST 23 06/18/2017   NA 140 06/18/2017   K 4.2 06/18/2017   CL 103 06/18/2017   CREATININE 1.07 06/18/2017   BUN 8 06/18/2017   CO2 27 06/18/2017   TSH 0.65 05/28/2017   INR 1.11 08/17/2016   HGBA1C 11.2 (H) 05/28/2017   PATH: Diagnosis 1. Colon, polyp(s), cecal, proximal transverse, splenic flexure, sigmoid - TUBULAR ADENOMA (X9 FRAGMENTS). - NO HIGH GRADE DYSPLASIA OR MALIGNANCY. 2. Colon, polyp(s), cecal - POLYPOID ARTERIOVENOUS MALFORMATION. - NO DYSPLASIA OR MALIGNANCY. 3. Stomach, biopsy, and gastric polyp - FUNDIC GLAND POLYPS. - FRAGMENT WITH MILD REACTIVE GASTROPATHY. - NEGATIVE FOR HELICOBACTER PYLORI. - NO INTESTINAL METAPLASIA, DYSPLASIA, OR MALIGNANCY. 4. Stomach, biopsy - ADENOCARCINOMA. 5. Esophagus, biopsy - ADENOCARCINOMA. 1 of   Assessment / Plan:   1/  7cm long, bulky, focally circumferential (distally), [id number] (Stage II) GE junction adenocarcinoma with nearby satellite lesion 2cm distal to the GE junction 2/ moderate aortic stenosis was severe LV dysfunction question of previous anterior myocardial infarction no previous cardiac catheterization has been done  Patient has been unable to complete his planned chemotherapy due to low platelet count   With his known aortic stenosis and poor LV function , this all increases his perioperative risk.  Between his mobility and  poor cardiac status I am very hesitant to proceed with surgical resection.  He has had good improvement in his swallowing ability with current treatment.  I discussed with he and his sister having a repeat upper GI endoscopy with biopsy by the GI service at [facility].  Depending on the results of this we could determine should he have further treatment or just observation at this point.   I plan to see him back in 4 weeks .  Grace Isaac MD      [address] [place] [address] Office  [place] [phone]  07/22/2017 12:51 PM

## 2017-07-26 DIAGNOSIS — H25813 Combined forms of age-related cataract, bilateral: Secondary | ICD-10-CM | POA: Diagnosis not present

## 2017-07-26 DIAGNOSIS — Z7984 Long term (current) use of oral hypoglycemic drugs: Secondary | ICD-10-CM | POA: Diagnosis not present

## 2017-07-26 DIAGNOSIS — I10 Essential (primary) hypertension: Secondary | ICD-10-CM | POA: Diagnosis not present

## 2017-07-26 DIAGNOSIS — E119 Type 2 diabetes mellitus without complications: Secondary | ICD-10-CM | POA: Diagnosis not present

## 2017-07-26 DIAGNOSIS — H35033 Hypertensive retinopathy, bilateral: Secondary | ICD-10-CM | POA: Diagnosis not present

## 2017-07-26 DIAGNOSIS — H43813 Vitreous degeneration, bilateral: Secondary | ICD-10-CM | POA: Diagnosis not present

## 2017-07-26 DIAGNOSIS — H52223 Regular astigmatism, bilateral: Secondary | ICD-10-CM | POA: Diagnosis not present

## 2017-07-26 DIAGNOSIS — H25013 Cortical age-related cataract, bilateral: Secondary | ICD-10-CM | POA: Diagnosis not present

## 2017-07-26 DIAGNOSIS — H40013 Open angle with borderline findings, low risk, bilateral: Secondary | ICD-10-CM | POA: Diagnosis not present

## 2017-07-26 DIAGNOSIS — H524 Presbyopia: Secondary | ICD-10-CM | POA: Diagnosis not present

## 2017-07-26 DIAGNOSIS — H2513 Age-related nuclear cataract, bilateral: Secondary | ICD-10-CM | POA: Diagnosis not present

## 2017-07-26 DIAGNOSIS — H5213 Myopia, bilateral: Secondary | ICD-10-CM | POA: Diagnosis not present

## 2017-07-27 ENCOUNTER — Other Ambulatory Visit (HOSPITAL_COMMUNITY): Payer: Self-pay

## 2017-07-27 ENCOUNTER — Ambulatory Visit (HOSPITAL_COMMUNITY)
Admission: RE | Admit: 2017-07-27 | Discharge: 2017-07-27 | Disposition: A | Discharge status: Home / Self Care | Payer: Medicare Other | Source: Ambulatory Visit | Attending: Cardiovascular Disease | Admitting: Cardiovascular Disease

## 2017-07-27 DIAGNOSIS — I352 Nonrheumatic aortic (valve) stenosis with insufficiency: Secondary | ICD-10-CM | POA: Diagnosis not present

## 2017-07-27 DIAGNOSIS — I509 Heart failure, unspecified: Secondary | ICD-10-CM | POA: Diagnosis not present

## 2017-07-27 DIAGNOSIS — I350 Nonrheumatic aortic (valve) stenosis: Secondary | ICD-10-CM

## 2017-07-27 DIAGNOSIS — I110 Hypertensive heart disease with heart failure: Secondary | ICD-10-CM | POA: Insufficient documentation

## 2017-07-27 DIAGNOSIS — E785 Hyperlipidemia, unspecified: Secondary | ICD-10-CM | POA: Insufficient documentation

## 2017-07-27 DIAGNOSIS — E119 Type 2 diabetes mellitus without complications: Secondary | ICD-10-CM | POA: Insufficient documentation

## 2017-07-27 DIAGNOSIS — C159 Malignant neoplasm of esophagus, unspecified: Secondary | ICD-10-CM

## 2017-07-27 NOTE — Progress Notes (Signed)
*  PRELIMINARY RESULTS* Echocardiogram 2D Echocardiogram has been performed.  [name] 07/27/2017, 3:04 PM

## 2017-07-27 NOTE — Progress Notes (Signed)
Received via in-basket from Dr. Delton Coombes request for patient to be referred to Dr. Oneida Alar for EGD and biopsy. Referral entered.

## 2017-07-27 NOTE — Progress Notes (Signed)
Referral entered and message went to scheduling.

## 2017-08-09 DIAGNOSIS — C159 Malignant neoplasm of esophagus, unspecified: Secondary | ICD-10-CM | POA: Diagnosis not present

## 2017-08-09 DIAGNOSIS — E1121 Type 2 diabetes mellitus with diabetic nephropathy: Secondary | ICD-10-CM | POA: Diagnosis not present

## 2017-08-09 DIAGNOSIS — I5022 Chronic systolic (congestive) heart failure: Secondary | ICD-10-CM | POA: Diagnosis not present

## 2017-08-09 DIAGNOSIS — I129 Hypertensive chronic kidney disease with stage 1 through stage 4 chronic kidney disease, or unspecified chronic kidney disease: Secondary | ICD-10-CM | POA: Diagnosis not present

## 2017-08-16 ENCOUNTER — Other Ambulatory Visit: Payer: Self-pay | Admitting: Cardiovascular Disease

## 2017-08-16 ENCOUNTER — Telehealth: Payer: Self-pay | Admitting: Gastroenterology

## 2017-08-16 NOTE — Telephone Encounter (Signed)
[name] is aware and said she or another lady should be able to bring him if we give call for a cancellation on same day if enough time.

## 2017-08-16 NOTE — Telephone Encounter (Signed)
LMOM for a return call.  

## 2017-08-16 NOTE — Telephone Encounter (Signed)
Referral #[id number] has been attached to the [place] on 09/13/2017 at 130 with EG. If we get a cancellation we can move him to something sooner.

## 2017-08-16 NOTE — Telephone Encounter (Signed)
[name] said the cancer center has sent referral for pt that he needs another EGD since he has completed his treatments.  The referral was sent in work que. [name], what is the status of this referral?

## 2017-08-16 NOTE — Telephone Encounter (Signed)
[name]) is wanting to speak with nurse about patient. Please call [phone]

## 2017-08-24 ENCOUNTER — Ambulatory Visit: Payer: Self-pay | Admitting: Cardiovascular Disease

## 2017-09-01 DIAGNOSIS — E1165 Type 2 diabetes mellitus with hyperglycemia: Secondary | ICD-10-CM | POA: Diagnosis not present

## 2017-09-01 DIAGNOSIS — M545 Low back pain: Secondary | ICD-10-CM | POA: Diagnosis not present

## 2017-09-01 DIAGNOSIS — C159 Malignant neoplasm of esophagus, unspecified: Secondary | ICD-10-CM | POA: Diagnosis not present

## 2017-09-01 DIAGNOSIS — I129 Hypertensive chronic kidney disease with stage 1 through stage 4 chronic kidney disease, or unspecified chronic kidney disease: Secondary | ICD-10-CM | POA: Diagnosis not present

## 2017-09-02 ENCOUNTER — Other Ambulatory Visit: Payer: Self-pay | Admitting: Adult Health

## 2017-09-09 ENCOUNTER — Encounter: Payer: Self-pay | Admitting: Cardiothoracic Surgery

## 2017-09-13 ENCOUNTER — Telehealth: Payer: Self-pay

## 2017-09-13 ENCOUNTER — Other Ambulatory Visit: Payer: Self-pay

## 2017-09-13 ENCOUNTER — Ambulatory Visit (INDEPENDENT_AMBULATORY_CARE_PROVIDER_SITE_OTHER): Payer: Medicare Other | Admitting: Nurse Practitioner

## 2017-09-13 ENCOUNTER — Encounter: Payer: Self-pay | Admitting: Nurse Practitioner

## 2017-09-13 ENCOUNTER — Encounter

## 2017-09-13 VITALS — BP 140/92 | HR 95 | Temp 97.1°F | Ht 64.0 in | Wt 187.6 lb

## 2017-09-13 DIAGNOSIS — K219 Gastro-esophageal reflux disease without esophagitis: Secondary | ICD-10-CM

## 2017-09-13 DIAGNOSIS — I509 Heart failure, unspecified: Secondary | ICD-10-CM | POA: Diagnosis not present

## 2017-09-13 DIAGNOSIS — I25118 Atherosclerotic heart disease of native coronary artery with other forms of angina pectoris: Secondary | ICD-10-CM | POA: Diagnosis not present

## 2017-09-13 DIAGNOSIS — C160 Malignant neoplasm of cardia: Secondary | ICD-10-CM | POA: Diagnosis not present

## 2017-09-13 MED ORDER — PANTOPRAZOLE SODIUM 40 MG PO TBEC: 40.0000 mg | DELAYED_RELEASE_TABLET | Freq: Two times a day (BID) | ORAL | 3 refills | Status: DC

## 2017-09-13 NOTE — Progress Notes (Addendum)
REVIEWED-NO ADDITIONAL RECOMMENDATIONS.  Referring Provider: Sinda Du, MD Primary Care Physician:  Sinda Du, MD Primary GI:  Dr. Oneida Alar  Chief Complaint  Patient presents with  . Dysphagia    sometimes food comes back up    HPI:   [name] is a 67 y.o. 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  This all began in June 2018 with increasing dysphasia symptoms which are progressively worse.  He was last seen by oncology 07/21/2017 at which point he was stating he was able to swallow better.  He is eating hamburgers and meats as well as most cons of breads.  Eating one main meal a day and snacks in between.  Improvement in fatigue.  Noted treatment include chemotherapy and radiation.  Repeat CT scan of the chest abdomen and pelvis dated 07/19/2017 which showed nonspecific persistent moderate irregular circumferential wall thickening of the lower thoracic esophagus which is decreased from previous scans.  This could represent posttreatment changes versus residual tumor.  It was indicated he had a follow-up appointment with cardiothoracic surgery the next day and if not a surgical candidate would recommend GI follow-up with repeat endoscopy and biopsy.  Follow-up CT scans in 3 months.  He saw cardiothoracic surgery 07/22/2017 which noted she was unable to complete plan chemotherapy due to thrombocytopenia.  Increased perioperative risk due to known aortic stenosis and poor LV function and they indicated they were very hesitant to proceed with surgical resection.  Recommended proceeding with repeat GI endoscopy and biopsy and pending results could determine if further treatment or just observation would be needed.  Recommended follow-up in 4 weeks.  Today he is accompanied by Langley Gauss (cousin/POA).  Today he states he's still having dysphagia symptoms with solid foods; no  pill dysphagia. Worst with meats and breads. Denies abdominal pain. Has had some nausea, last episode of vomiting last week associated with some nausea. Had a single episode of hematochezia after "vigorous cleaning" after a bowel movement, occurred about 3-4 weeks ago; none since. His colonoscopy is up to date (2019; repeat 2021.) Denies melena. He does have constipation with small hard balls of stool ([place] 1-2), straining, incomplete emptying. Taking Amitiza 24 mcg, cut back due to diarrhea now taking 2-3 times a week. Denies chest pain, dyspnea, dizziness, lightheadedness, syncope, near syncope. Denies any other upper or lower GI symptoms.  He is on Dexilant and his cousin states it is expensive and wondering if there's a cheaper alternative.  Past Medical History:  Diagnosis Date  . Arthritis   . Bipolar disorder ([place])   . CAD (coronary artery disease) 08/16/2016  . Cellulitis and abscess of foot 03/05/2016  . Chronic combined systolic and diastolic heart failure ([place])   . Diabetes mellitus without complication ([place])    boderline  . GERD (gastroesophageal reflux disease)   . Heart murmur 1995  . History of kidney stones   . Hypercholesteremia   . Hypertension   . Kidney stones   . Myocardial infarction ([place])   . OSA (obstructive sleep apnea)    no cpap, can't tolerate  . Schizoaffective disorder, bipolar type ([place]) 04/25/2016    Past Surgical History:  Procedure Laterality Date  . APPENDECTOMY    . BIOPSY  03/09/2017   Procedure: BIOPSY;  Surgeon: Danie Binder, MD;  Location: [facility];  Service: Endoscopy;;  gastric esophageal  . CHOLECYSTECTOMY N/A  10/20/2013   Procedure: LAPAROSCOPIC CHOLECYSTECTOMY;  Surgeon: [name], MD;  Location: [facility];  Service: General;  Laterality: N/A;  . COLONOSCOPY WITH PROPOFOL N/A 03/09/2017   Procedure: COLONOSCOPY WITH PROPOFOL;  Surgeon: Danie Binder, MD;  [facility]: [facility];  Service: Endoscopy;  Laterality: N/A;  12:15pm  .  ESOPHAGOGASTRODUODENOSCOPY (EGD) WITH PROPOFOL N/A 03/09/2017   Procedure: ESOPHAGOGASTRODUODENOSCOPY (EGD) WITH PROPOFOL;  Surgeon: Danie Binder, MD;  [facility];  Service: Endoscopy;  Laterality: N/A;  . EUS N/A 04/08/2017   Procedure: UPPER ENDOSCOPIC ULTRASOUND (EUS) RADIAL;  Surgeon: Milus Banister, MD;  Location: [facility];  Service: Endoscopy;  Laterality: N/A;  . HIP SURGERY Left   . KNEE SURGERY Left   . POLYPECTOMY  03/09/2017   Procedure: POLYPECTOMY;  Surgeon: Danie Binder, MD;  [facility];  Service: Endoscopy;;  colon   . PORTACATH PLACEMENT Left 04/07/2017   Procedure: INSERTION PORT-A-CATH LEFT SUBCLAVIAN;  Surgeon: Virl Cagey, MD;  Location: [facility];  Service: General;  Laterality: Left;  . SAVORY DILATION N/A 03/09/2017   Procedure: SAVORY DILATION;  Surgeon: Danie Binder, MD;  [facility]: [facility];  Service: Endoscopy;  Laterality: N/A;    Current Outpatient Medications  Medication Sig Dispense Refill  . acetaminophen (TYLENOL) 325 MG tablet Take 650 mg by mouth every 6 (six) hours as needed.    . AMITIZA 24 MCG capsule Take 24 mcg by mouth 2 (two) times a week.     Marland Kitchen aspirin EC 81 MG tablet Take 81 mg by mouth daily.    . Blood Glucose Monitoring Suppl (ONETOUCH VERIO) w/Device KIT 1 each by Does not apply route as needed. 1 kit 0  . carvedilol (COREG) 12.5 MG tablet TAKE 1 TABLET(12.5 MG) BY MOUTH TWICE DAILY 180 tablet 3  . dexlansoprazole (DEXILANT) 60 MG capsule Take 1 capsule (60 mg total) by mouth daily. 90 capsule 3  . ENTRESTO 49-51 MG TAKE 1 TABLET BY MOUTH TWICE DAILY 180 tablet 3  . furosemide (LASIX) 40 MG tablet Alternate 40 mg one day and 20 mg the next (Patient taking differently: 40 mg daily. ) 90 tablet 3  . glucose blood test strip 1 each by Other route 4 (four) times daily. Use as instructed qid One Touch ultra 150 each 5  . linagliptin (TRADJENTA) 5 MG TABS tablet Take 5 mg by mouth daily.    . metFORMIN  (GLUCOPHAGE) 500 MG tablet Take 1 tablet (500 mg total) by mouth 2 (two) times daily with a meal. 60 tablet 2  . OLANZapine (ZYPREXA) 5 MG tablet Take 5 mg by mouth 2 (two) times daily.    . ondansetron (ZOFRAN ODT) 8 MG disintegrating tablet Take 1 tablet (8 mg total) by mouth every 8 (eight) hours as needed for nausea or vomiting. 40 tablet 2  . oxyCODONE (ROXICODONE) 5 MG immediate release tablet Take 1 tablet (5 mg total) by mouth every 4 (four) hours as needed. 10 tablet 0  . potassium chloride SA (K-DUR,KLOR-CON) 20 MEQ tablet Take 20 mEq by mouth daily.    . rosuvastatin (CRESTOR) 10 MG tablet Take 10 mg by mouth daily.     No current facility-administered medications for this visit.     Allergies as of 09/13/2017 - Review Complete 09/13/2017  Allergen Reaction Noted  . Codeine Nausea And Vomiting 09/27/2013  . Morphine and related Nausea And Vomiting 09/27/2013    Family History  Problem Relation Age  of Onset  . Hypertension Mother   . Dementia Mother   . Cancer Mother        breast  . Colon cancer Neg Hx   . Colon polyps Neg Hx     Social History   Socioeconomic History  . Marital status: Divorced    Spouse name: Not on file  . Number of children: Not on file  . Years of education: Not on file  . Highest education level: Not on file  Occupational History  . Not on file  Social Needs  . Financial resource strain: Not on file  . Food insecurity:    Worry: Not on file    Inability: Not on file  . Transportation needs:    Medical: Not on file    Non-medical: Not on file  Tobacco Use  . Smoking status: Never Smoker  . Smokeless tobacco: Never Used  Substance and Sexual Activity  . Alcohol use: No  . Drug use: No  . Sexual activity: Not Currently    Birth control/protection: Implant  Lifestyle  . Physical activity:    Days per week: Not on file    Minutes per session: Not on file  . Stress: Not on file  Relationships  . Social connections:    Talks on  phone: Not on file    Gets together: Not on file    Attends religious service: Not on file    Active member of club or organization: Not on file    Attends meetings of clubs or organizations: Not on file    Relationship status: Not on file  Other Topics Concern  . Not on file  Social History Narrative  . Not on file    Review of Systems: General: Negative for anorexia, weight loss, fever, chills, fatigue, weakness.  ENT: Negative for hoarseness. Admits persistent dysphagia. CV: Negative for chest pain, angina, palpitations, peripheral edema.  Respiratory: Negative for dyspnea at rest, cough, sputum, wheezing.  GI: See history of present illness. Endo: Negative for unusual weight change.  Heme: Negative for bruising or bleeding.   Physical Exam: BP (!) 140/92   Pulse 95   Temp (!) 97.1 F (36.2 C) (Oral)   Ht 5' 4" (1.626 m)   Wt 187 lb 9.6 oz (85.1 kg)   BMI 32.20 kg/m  General:   Alert and oriented. Pleasant and cooperative. Well-nourished and well-developed.  Eyes:  Without icterus, sclera clear and conjunctiva pink.  Ears:  Normal auditory acuity. Cardiovascular:  S1, S2 present with 5/6 systolic murmur appreciated. Extremities without clubbing or edema. Respiratory:  Clear to auscultation bilaterally. No wheezes, rales, or rhonchi. No distress.  Gastrointestinal:  +BS, soft, non-tender and non-distended. No HSM noted. No guarding or rebound. No masses appreciated.  Rectal:  Deferred  Musculoskalatal:  Symmetrical without gross deformities. Neurologic:  Alert and oriented x4;  grossly normal neurologically. Psych:  Alert and cooperative. Normal mood and affect. Heme/Lymph/Immune: No excessive bruising noted.    09/13/2017 1:55 PM   Disclaimer: This note was dictated with voice recognition software. Similar sounding words can inadvertently be transcribed and may not be corrected upon review.

## 2017-09-13 NOTE — Patient Instructions (Signed)
1. Stop taking Dexilant for now. 2. I sent a prescription to your pharmacy for Protonix 40 mg.  Take this twice a day on an empty stomach. 3. As we discussed you can increase the frequency of Amitiza.  Start with every other day.  You can increase this to every day, or back off to every 3 days.  If the dosing is difficult to get to good bowel movements without diarrhea, let us know and we can try adjusting the dose of the Amitiza. 4. We will schedule your upper endoscopy for you. 5. Further recommendations will be made after your upper endoscopy. 6. Call us if you have any questions or concerns.  At [facility] we value your feedback. You may receive a survey about your visit today. Please share your experience as we strive to create trusting relationships with our patients to provide genuine, compassionate, quality care.  It was great to meet you today!  I hope you have a great summer!!

## 2017-09-13 NOTE — Telephone Encounter (Signed)
Tried to call pt's POA to inform of pre-op appt 11/17/17 at 10:00am, no answer, LMOVM. Letter mailed.

## 2017-09-17 NOTE — Assessment & Plan Note (Signed)
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  He is status post treatment.  Repeat CT scan on 07/19/2017 shows nonspecific persistent moderate irregular circumferential wall thickening of the lower thoracic esophagus which is decreased from previous scans which could represent post-treatment changes versus residual tumor.  They are recommending a repeat endoscopy to further evaluate.  We will schedule him at this time.  Follow-up in 3 months.  Proceed with EGD on propofol/MAC with Dr. Oneida Alar in near future: the risks, benefits, and alternatives have been discussed with the patient in detail. The patient states understanding and desires to proceed.  The patient is currently on oxycodone.  No other anticoagulants, anxiolytics, chronic pain medications, or antidepressants.  We will plan for the procedure on propofol/MAC to promote adequate sedation.

## 2017-09-17 NOTE — Assessment & Plan Note (Signed)
The patient does have a history of CHF.  His procedure set for propofol/monitored anesthesia care.  This should help more closely monitor him during his procedure.  However, he is on Entresto to help with his heart failure and his EF has improved to low normal (52% on cardiac echo dated 07/27/2017).  He is generally asymptomatic and regards to his heart failure history.  Recommend he continue his current medications.  Follow-up with cardiology based on their recommendations.

## 2017-09-17 NOTE — Assessment & Plan Note (Signed)
The patient is currently on Dexilant for GERD.  He states it is too expensive and requesting an alternative.  I will have him stop Dexilant and start Protonix 40 mg twice daily.  We will set him up for EGD based on previous recommendations, as per above.  Follow-up in 3 months.

## 2017-09-19 ENCOUNTER — Other Ambulatory Visit: Payer: Self-pay | Admitting: "Endocrinology

## 2017-09-20 ENCOUNTER — Other Ambulatory Visit: Payer: Self-pay

## 2017-09-20 MED ORDER — ROSUVASTATIN CALCIUM 10 MG PO TABS: 10.0000 mg | ORAL_TABLET | Freq: Every day | ORAL | 3 refills | Status: AC

## 2017-09-20 NOTE — Progress Notes (Signed)
cc'ed to pcp °

## 2017-09-20 NOTE — Telephone Encounter (Signed)
Refilled crestor per fax request

## 2017-09-21 ENCOUNTER — Other Ambulatory Visit: Payer: Self-pay | Admitting: Adult Health

## 2017-09-22 ENCOUNTER — Ambulatory Visit: Payer: Medicare Other | Admitting: "Endocrinology

## 2017-09-28 DIAGNOSIS — B351 Tinea unguium: Secondary | ICD-10-CM | POA: Diagnosis not present

## 2017-09-28 DIAGNOSIS — E1142 Type 2 diabetes mellitus with diabetic polyneuropathy: Secondary | ICD-10-CM | POA: Diagnosis not present

## 2017-09-28 DIAGNOSIS — M79675 Pain in left toe(s): Secondary | ICD-10-CM | POA: Diagnosis not present

## 2017-09-28 DIAGNOSIS — M79674 Pain in right toe(s): Secondary | ICD-10-CM | POA: Diagnosis not present

## 2017-09-28 DIAGNOSIS — R600 Localized edema: Secondary | ICD-10-CM | POA: Diagnosis not present

## 2017-09-30 ENCOUNTER — Encounter: Payer: Self-pay | Admitting: Cardiothoracic Surgery

## 2017-10-06 ENCOUNTER — Ambulatory Visit: Payer: Medicare Other | Admitting: "Endocrinology

## 2017-10-08 DIAGNOSIS — E1159 Type 2 diabetes mellitus with other circulatory complications: Secondary | ICD-10-CM | POA: Diagnosis not present

## 2017-10-09 LAB — COMPLETE METABOLIC PANEL WITH GFR
AG Ratio: 1.3 (calc) (ref 1.0–2.5)
ALKALINE PHOSPHATASE (APISO): 189 U/L — AB (ref 40–115)
ALT: 20 U/L (ref 9–46)
AST: 21 U/L (ref 10–35)
Albumin: 3.6 g/dL (ref 3.6–5.1)
BUN: 13 mg/dL (ref 7–25)
CALCIUM: 9.1 mg/dL (ref 8.6–10.3)
CO2: 28 mmol/L (ref 20–32)
CREATININE: 1 mg/dL (ref 0.70–1.25)
Chloride: 100 mmol/L (ref 98–110)
GFR, EST NON AFRICAN AMERICAN: 78 mL/min/{1.73_m2} (ref >=60)
GFR, Est African American: 90 mL/min/{1.73_m2} (ref >=60)
GLOBULIN: 2.7 g/dL (ref 1.9–3.7)
Glucose, Bld: 291 mg/dL — ABNORMAL HIGH (ref 65–99)
Potassium: 4 mmol/L (ref 3.5–5.3)
Sodium: 138 mmol/L (ref 135–146)
Total Bilirubin: 0.5 mg/dL (ref 0.2–1.2)
Total Protein: 6.3 g/dL (ref 6.1–8.1)

## 2017-10-09 LAB — HEMOGLOBIN A1C
EAG (MMOL/L): 15.7 (calc)
Hgb A1c MFr Bld: 11.5 % of total Hgb — ABNORMAL HIGH (ref <5.7)
Mean Plasma Glucose: 283 (calc)

## 2017-10-19 ENCOUNTER — Other Ambulatory Visit (HOSPITAL_COMMUNITY): Payer: Self-pay

## 2017-10-25 ENCOUNTER — Inpatient Hospital Stay (HOSPITAL_COMMUNITY): Payer: Medicare Other | Attending: Hematology

## 2017-10-25 ENCOUNTER — Ambulatory Visit (HOSPITAL_COMMUNITY)
Admission: RE | Admit: 2017-10-25 | Discharge: 2017-10-25 | Disposition: A | Discharge status: Home / Self Care | Payer: Medicare Other | Source: Ambulatory Visit | Attending: Hematology | Admitting: Hematology

## 2017-10-25 DIAGNOSIS — G4733 Obstructive sleep apnea (adult) (pediatric): Secondary | ICD-10-CM | POA: Diagnosis not present

## 2017-10-25 DIAGNOSIS — C787 Secondary malignant neoplasm of liver and intrahepatic bile duct: Secondary | ICD-10-CM | POA: Diagnosis not present

## 2017-10-25 DIAGNOSIS — F250 Schizoaffective disorder, bipolar type: Secondary | ICD-10-CM | POA: Diagnosis not present

## 2017-10-25 DIAGNOSIS — I252 Old myocardial infarction: Secondary | ICD-10-CM | POA: Diagnosis not present

## 2017-10-25 DIAGNOSIS — C155 Malignant neoplasm of lower third of esophagus: Secondary | ICD-10-CM | POA: Diagnosis not present

## 2017-10-25 DIAGNOSIS — N183 Chronic kidney disease, stage 3 (moderate): Secondary | ICD-10-CM | POA: Insufficient documentation

## 2017-10-25 DIAGNOSIS — I2510 Atherosclerotic heart disease of native coronary artery without angina pectoris: Secondary | ICD-10-CM | POA: Diagnosis not present

## 2017-10-25 DIAGNOSIS — Z79899 Other long term (current) drug therapy: Secondary | ICD-10-CM | POA: Diagnosis not present

## 2017-10-25 DIAGNOSIS — I130 Hypertensive heart and chronic kidney disease with heart failure and stage 1 through stage 4 chronic kidney disease, or unspecified chronic kidney disease: Secondary | ICD-10-CM | POA: Insufficient documentation

## 2017-10-25 DIAGNOSIS — I5043 Acute on chronic combined systolic (congestive) and diastolic (congestive) heart failure: Secondary | ICD-10-CM | POA: Diagnosis not present

## 2017-10-25 DIAGNOSIS — Z923 Personal history of irradiation: Secondary | ICD-10-CM | POA: Diagnosis not present

## 2017-10-25 DIAGNOSIS — I700 Atherosclerosis of aorta: Secondary | ICD-10-CM | POA: Insufficient documentation

## 2017-10-25 DIAGNOSIS — R911 Solitary pulmonary nodule: Secondary | ICD-10-CM | POA: Insufficient documentation

## 2017-10-25 DIAGNOSIS — E785 Hyperlipidemia, unspecified: Secondary | ICD-10-CM | POA: Diagnosis not present

## 2017-10-25 DIAGNOSIS — C160 Malignant neoplasm of cardia: Secondary | ICD-10-CM | POA: Insufficient documentation

## 2017-10-25 DIAGNOSIS — Z9221 Personal history of antineoplastic chemotherapy: Secondary | ICD-10-CM | POA: Insufficient documentation

## 2017-10-25 DIAGNOSIS — Z7982 Long term (current) use of aspirin: Secondary | ICD-10-CM | POA: Insufficient documentation

## 2017-10-25 DIAGNOSIS — K219 Gastro-esophageal reflux disease without esophagitis: Secondary | ICD-10-CM | POA: Diagnosis not present

## 2017-10-25 DIAGNOSIS — Z7984 Long term (current) use of oral hypoglycemic drugs: Secondary | ICD-10-CM | POA: Diagnosis not present

## 2017-10-25 DIAGNOSIS — E1165 Type 2 diabetes mellitus with hyperglycemia: Secondary | ICD-10-CM | POA: Diagnosis not present

## 2017-10-25 DIAGNOSIS — Z803 Family history of malignant neoplasm of breast: Secondary | ICD-10-CM | POA: Diagnosis not present

## 2017-10-25 DIAGNOSIS — D696 Thrombocytopenia, unspecified: Secondary | ICD-10-CM | POA: Insufficient documentation

## 2017-10-25 LAB — COMPREHENSIVE METABOLIC PANEL
ALK PHOS: 188 U/L — AB (ref 38–126)
ALT: 26 U/L (ref 0–44)
AST: 28 U/L (ref 15–41)
Albumin: 3.5 g/dL (ref 3.5–5.0)
Anion gap: 9 (ref 5–15)
BILIRUBIN TOTAL: 0.7 mg/dL (ref 0.3–1.2)
BUN: 13 mg/dL (ref 8–23)
CALCIUM: 8.9 mg/dL (ref 8.9–10.3)
CO2: 26 mmol/L (ref 22–32)
CREATININE: 1.03 mg/dL (ref 0.61–1.24)
Chloride: 99 mmol/L (ref 98–111)
GFR calc Af Amer: >60 mL/min (ref >60)
Glucose, Bld: 441 mg/dL — ABNORMAL HIGH (ref 70–99)
POTASSIUM: 4.2 mmol/L (ref 3.5–5.1)
Sodium: 134 mmol/L — ABNORMAL LOW (ref 135–145)
TOTAL PROTEIN: 6.8 g/dL (ref 6.5–8.1)

## 2017-10-25 LAB — CBC WITH DIFFERENTIAL/PLATELET
BASOS ABS: 0 10*3/uL (ref 0.0–0.1)
Basophils Relative: 0 %
Eosinophils Absolute: 0.2 10*3/uL (ref 0.0–0.7)
Eosinophils Relative: 4 %
HEMATOCRIT: 45.2 % (ref 39.0–52.0)
HEMOGLOBIN: 15.7 g/dL (ref 13.0–17.0)
LYMPHS PCT: 12 %
Lymphs Abs: 0.8 10*3/uL (ref 0.7–4.0)
MCH: 30.1 pg (ref 26.0–34.0)
MCHC: 34.7 g/dL (ref 30.0–36.0)
MCV: 86.6 fL (ref 78.0–100.0)
MONO ABS: 0.5 10*3/uL (ref 0.1–1.0)
Monocytes Relative: 8 %
NEUTROS ABS: 5.2 10*3/uL (ref 1.7–7.7)
Neutrophils Relative %: 76 %
Platelets: 101 10*3/uL — ABNORMAL LOW (ref 150–400)
RBC: 5.22 MIL/uL (ref 4.22–5.81)
RDW: 14.3 % (ref 11.5–15.5)
WBC: 6.8 10*3/uL (ref 4.0–10.5)

## 2017-10-25 MED ORDER — IOPAMIDOL (ISOVUE-300) INJECTION 61%
100.0000 mL | Freq: Once | INTRAVENOUS | Status: AC | PRN
  Administered 2017-10-25: 100 mL via INTRAVENOUS

## 2017-10-28 ENCOUNTER — Inpatient Hospital Stay (HOSPITAL_BASED_OUTPATIENT_CLINIC_OR_DEPARTMENT_OTHER): Payer: Medicare Other | Admitting: Internal Medicine

## 2017-10-28 ENCOUNTER — Encounter (HOSPITAL_COMMUNITY): Payer: Self-pay | Admitting: Internal Medicine

## 2017-10-28 VITALS — BP 107/72 | HR 80 | Temp 97.4°F | Resp 16 | Wt 187.9 lb

## 2017-10-28 DIAGNOSIS — C159 Malignant neoplasm of esophagus, unspecified: Secondary | ICD-10-CM

## 2017-10-28 DIAGNOSIS — I130 Hypertensive heart and chronic kidney disease with heart failure and stage 1 through stage 4 chronic kidney disease, or unspecified chronic kidney disease: Secondary | ICD-10-CM | POA: Diagnosis not present

## 2017-10-28 DIAGNOSIS — I252 Old myocardial infarction: Secondary | ICD-10-CM

## 2017-10-28 DIAGNOSIS — N183 Chronic kidney disease, stage 3 (moderate): Secondary | ICD-10-CM

## 2017-10-28 DIAGNOSIS — F250 Schizoaffective disorder, bipolar type: Secondary | ICD-10-CM

## 2017-10-28 DIAGNOSIS — C160 Malignant neoplasm of cardia: Secondary | ICD-10-CM | POA: Diagnosis not present

## 2017-10-28 DIAGNOSIS — K219 Gastro-esophageal reflux disease without esophagitis: Secondary | ICD-10-CM | POA: Diagnosis not present

## 2017-10-28 DIAGNOSIS — I700 Atherosclerosis of aorta: Secondary | ICD-10-CM

## 2017-10-28 DIAGNOSIS — D696 Thrombocytopenia, unspecified: Secondary | ICD-10-CM

## 2017-10-28 DIAGNOSIS — R911 Solitary pulmonary nodule: Secondary | ICD-10-CM

## 2017-10-28 DIAGNOSIS — E1165 Type 2 diabetes mellitus with hyperglycemia: Secondary | ICD-10-CM

## 2017-10-28 DIAGNOSIS — I5043 Acute on chronic combined systolic (congestive) and diastolic (congestive) heart failure: Secondary | ICD-10-CM | POA: Diagnosis not present

## 2017-10-28 DIAGNOSIS — I2510 Atherosclerotic heart disease of native coronary artery without angina pectoris: Secondary | ICD-10-CM | POA: Diagnosis not present

## 2017-10-28 DIAGNOSIS — E785 Hyperlipidemia, unspecified: Secondary | ICD-10-CM | POA: Diagnosis not present

## 2017-10-28 DIAGNOSIS — Z7984 Long term (current) use of oral hypoglycemic drugs: Secondary | ICD-10-CM

## 2017-10-28 DIAGNOSIS — Z923 Personal history of irradiation: Secondary | ICD-10-CM

## 2017-10-28 DIAGNOSIS — C787 Secondary malignant neoplasm of liver and intrahepatic bile duct: Secondary | ICD-10-CM

## 2017-10-28 DIAGNOSIS — Z79899 Other long term (current) drug therapy: Secondary | ICD-10-CM

## 2017-10-28 DIAGNOSIS — Z7982 Long term (current) use of aspirin: Secondary | ICD-10-CM

## 2017-10-28 DIAGNOSIS — Z803 Family history of malignant neoplasm of breast: Secondary | ICD-10-CM

## 2017-10-28 DIAGNOSIS — Z9221 Personal history of antineoplastic chemotherapy: Secondary | ICD-10-CM

## 2017-10-28 DIAGNOSIS — G4733 Obstructive sleep apnea (adult) (pediatric): Secondary | ICD-10-CM

## 2017-10-28 NOTE — Progress Notes (Signed)
Diagnosis Malignant neoplasm of esophagus, unspecified location [facility]) - Plan: CT Biopsy  Staging Cancer Staging No matching staging information was found for the patient.  Assessment and Plan:  Malignant neoplasm of cardio-esophageal junction ([place]) 1.  Distal esophageal adenocarcinoma, stage II (UT3 UN 0):Pt followed by Dr. Worthy Keeler.   -7 cm long, bulky, focally circumferential, with nearby satellite lesion 2 cm distal to the GE junction -Radiation therapy started on 04/19/2017 through 05/26/2017, week 1 of carboplatin and paclitaxel on 04/21/2017, week 2 on 04/28/2017 - Chemotherapy held for the last 2 weeks secondary to thrombocytopenia -He reports that his swallowing ability has improved since completing chemoradiation therapy.    CT scan of the chest, abdomen and pelvis dated 07/19/2017 which showed nonspecific persistent moderate irregular circumferential wall thickening in the lower thoracic esophagus, decreased from previous scans.  This could be post treatment changes versus residual tumor.  There is a new 4 mm left lower lobe pulmonary nodule, indeterminate.  No other evidence of metastatic disease.  He has been seen by Dr. Servando Snare.    Pt had Labs done 10/25/2017 that showed WBC 6.8 HB 15.7 and plts 101,000.  Chemistries WNL with K+ 4.2, Cr 1 and normal LFTs.    Pt had CT CAP done 10/25/2017 reviewed with pt and family that showed  IMPRESSION: 1. Disease progression, as evidenced by development of bilateral hepatic metastasis. 2. Enlargement of a portal caval node, also suspicious for metastatic disease. 3. Similar distal esophageal wall thickening, without secondary signs of obstruction. 4. Paramediastinal pulmonary opacities are indeterminate, but new since the prior. Evolving radiation change could have this appearance. If no history of radiation therapy, considerations include infection or aspiration. 5. The previously described 4 mm left lower lobe pulmonary nodule is likely  obscured by airspace disease in this region. 6. Coronary artery atherosclerosis. Aortic Atherosclerosis (ICD10-I70.0).  I discussed with them that based on the scan results will ask for IR review for possible liver biopsy for definitive diagnosis.  Pt will RTC to follow-up with Dr.Katraggada to go over results.  He is asymptomatic today in clinic.    2. HTN.  BP is 107/72.  Follow-up with PCP.   Greater than 30 minutes spent with more than 50% spent in counseling and coordination of care.    Current Status:  Pt is seen today for follow-up.  He is accompanied by caretaker and family member.  They are here to go over scans.      Problem List Patient Active Problem List   Diagnosis Date Noted  . Uncontrolled type 2 diabetes mellitus with hyperglycemia ([place]) [E11.65] 05/27/2017  . Mixed hyperlipidemia [E78.2] 05/27/2017  . Decreased oral intake [R63.8] 04/22/2017  . Malignant neoplasm of esophagus (H[facility]) [C15.9]   . Malignant neoplasm of cardio-esophageal junction ([place]) [C16.0] 03/30/2017  . Encounter for screening colonoscopy [Z12.11] 01/29/2017  . Dysphagia [R13.10] 01/29/2017  . GERD (gastroesophageal reflux disease) [K21.9] 01/29/2017  . Acute renal failure superimposed on stage 3 chronic kidney disease ([place]) [N17.9, N18.3] 08/18/2016  . CHF (congestive heart failure) ([place]) [I50.9] 08/16/2016  . Acute on chronic combined systolic and diastolic CHF (congestive heart failure) ([place]) [I50.43] 08/16/2016  . OSA (obstructive sleep apnea) [G47.33] 08/16/2016  . Acute renal insufficiency [N28.9] 08/16/2016  . Acute on chronic combined systolic (congestive) and diastolic (congestive) heart failure ([place]) [I50.43] 08/16/2016  . CAD (coronary artery disease) [I25.10] 08/16/2016  . Schizoaffective disorder, bipolar type ([place]) [F25.0] 04/25/2016  . Acute on chronic diastolic heart failure ([place]) [I50.33] 03/07/2016  .  Cellulitis and abscess of foot [L03.119, L02.619] 03/05/2016  . DM type 2  causing vascular disease ([place]) [E11.59] 03/05/2016  . Essential hypertension, benign [I10] 03/05/2016  . Involuntary commitment [Z04.6] 03/05/2016  . Cellulitis [L03.90] 03/05/2016    Past Medical History Past Medical History:  Diagnosis Date  . Arthritis   . Bipolar disorder ([place])   . CAD (coronary artery disease) 08/16/2016  . Cellulitis and abscess of foot 03/05/2016  . Chronic combined systolic and diastolic heart failure ([place])   . Diabetes mellitus without complication ([place])    boderline  . GERD (gastroesophageal reflux disease)   . Heart murmur 1995  . History of kidney stones   . Hypercholesteremia   . Hypertension   . Kidney stones   . Myocardial infarction ([place])   . OSA (obstructive sleep apnea)    no cpap, can't tolerate  . Schizoaffective disorder, bipolar type ([place]) 04/25/2016    Past Surgical History Past Surgical History:  Procedure Laterality Date  . APPENDECTOMY    . BIOPSY  03/09/2017   Procedure: BIOPSY;  Surgeon: Danie Binder, MD;  [facility];  Service: Endoscopy;;  gastric esophageal  . CHOLECYSTECTOMY N/A 10/20/2013   Procedure: LAPAROSCOPIC CHOLECYSTECTOMY;  Surgeon: [name], MD;  Location: [facility] ORS;  Service: General;  Laterality: N/A;  . COLONOSCOPY WITH PROPOFOL N/A 03/09/2017   Procedure: COLONOSCOPY WITH PROPOFOL;  Surgeon: Danie Binder, MD;  [facility];  Service: Endoscopy;  Laterality: N/A;  12:15pm  . ESOPHAGOGASTRODUODENOSCOPY (EGD) WITH PROPOFOL N/A 03/09/2017   Procedure: ESOPHAGOGASTRODUODENOSCOPY (EGD) WITH PROPOFOL;  Surgeon: Danie Binder, MD;  [facility];  Service: Endoscopy;  Laterality: N/A;  . EUS N/A 04/08/2017   Procedure: UPPER ENDOSCOPIC ULTRASOUND (EUS) RADIAL;  Surgeon: Milus Banister, MD;  Location: [facility];  Service: Endoscopy;  Laterality: N/A;  . HIP SURGERY Left   . KNEE SURGERY Left   . POLYPECTOMY  03/09/2017   Procedure: POLYPECTOMY;  Surgeon: Danie Binder, MD;   [facility];  Service: Endoscopy;;  colon   . PORTACATH PLACEMENT Left 04/07/2017   Procedure: INSERTION PORT-A-CATH LEFT SUBCLAVIAN;  Surgeon: Virl Cagey, MD;  Location: [facility];  Service: General;  Laterality: Left;  . SAVORY DILATION N/A 03/09/2017   Procedure: SAVORY DILATION;  Surgeon: Danie Binder, MD;  [facility];  Service: Endoscopy;  Laterality: N/A;    Family History Family History  Problem Relation Age of Onset  . Hypertension Mother   . Dementia Mother   . Cancer Mother        breast  . Colon cancer Neg Hx   . Colon polyps Neg Hx      Social History  reports that he has never smoked. He has never used smokeless tobacco. He reports that he does not drink alcohol or use drugs.  Medications  Current Outpatient Medications:  .  acetaminophen (TYLENOL) 325 MG tablet, Take 650 mg by mouth every 6 (six) hours as needed., Disp: , Rfl:  .  AMITIZA 24 MCG capsule, Take 24 mcg by mouth 2 (two) times a week. , Disp: , Rfl:  .  aspirin EC 81 MG tablet, Take 81 mg by mouth daily., Disp: , Rfl:  .  Blood Glucose Monitoring Suppl (ONETOUCH VERIO) w/Device KIT, 1 each by Does not apply route as needed., Disp: 1 kit, Rfl: 0 .  carvedilol (COREG) 12.5 MG tablet, TAKE 1 TABLET(12.5 MG) BY MOUTH TWICE DAILY,  Disp: 180 tablet, Rfl: 3 .  dexlansoprazole (DEXILANT) 60 MG capsule, Take 1 capsule (60 mg total) by mouth daily., Disp: 90 capsule, Rfl: 3 .  ENTRESTO 49-51 MG, TAKE 1 TABLET BY MOUTH TWICE DAILY, Disp: 180 tablet, Rfl: 3 .  furosemide (LASIX) 40 MG tablet, Alternate 40 mg one day and 20 mg the next (Patient taking differently: 40 mg daily. ), Disp: 90 tablet, Rfl: 3 .  glucose blood test strip, 1 each by Other route 4 (four) times daily. Use as instructed qid One Touch ultra, Disp: 150 each, Rfl: 5 .  linagliptin (TRADJENTA) 5 MG TABS tablet, Take 5 mg by mouth daily., Disp: , Rfl:  .  metFORMIN (GLUCOPHAGE) 500 MG tablet, TAKE 1 TABLET(500 MG) BY  MOUTH TWICE DAILY WITH A MEAL, Disp: 60 tablet, Rfl: 2 .  OLANZapine (ZYPREXA) 5 MG tablet, Take 5 mg by mouth 2 (two) times daily., Disp: , Rfl:  .  ondansetron (ZOFRAN ODT) 8 MG disintegrating tablet, Take 1 tablet (8 mg total) by mouth every 8 (eight) hours as needed for nausea or vomiting., Disp: 40 tablet, Rfl: 2 .  oxyCODONE (ROXICODONE) 5 MG immediate release tablet, Take 1 tablet (5 mg total) by mouth every 4 (four) hours as needed., Disp: 10 tablet, Rfl: 0 .  pantoprazole (PROTONIX) 40 MG tablet, Take 1 tablet (40 mg total) by mouth 2 (two) times daily before a meal., Disp: 60 tablet, Rfl: 3 .  potassium chloride SA (K-DUR,KLOR-CON) 20 MEQ tablet, Take 20 mEq by mouth daily., Disp: , Rfl:  .  rosuvastatin (CRESTOR) 10 MG tablet, Take 1 tablet (10 mg total) by mouth daily., Disp: 90 tablet, Rfl: 3 .  rosuvastatin (CRESTOR) 10 MG tablet, TAKE 1 TABLET(10 MG) BY MOUTH DAILY, Disp: 90 tablet, Rfl: 3  Allergies Codeine and Morphine and related  Review of Systems Review of Systems - Oncology ROS negative   Physical Exam  Vitals Wt Readings from Last 3 Encounters:  10/28/17 187 lb 14.4 oz (85.2 kg)  09/13/17 187 lb 9.6 oz (85.1 kg)  07/22/17 177 lb (80.3 kg)   Temp Readings from Last 3 Encounters:  10/28/17 (!) 97.4 F (36.3 C) (Oral)  09/13/17 (!) 97.1 F (36.2 C) (Oral)  07/21/17 97.8 F (36.6 C) (Oral)   BP Readings from Last 3 Encounters:  10/28/17 107/72  09/13/17 (!) 140/92  07/22/17 (!) 130/92   Pulse Readings from Last 3 Encounters:  10/28/17 80  09/13/17 95  07/22/17 90    Constitutional: Well-developed, well-nourished, and in no distress.   HENT: Head: Normocephalic and atraumatic.  Mouth/Throat: No oropharyngeal exudate. Mucosa moist. Eyes: Pupils are equal, round, and reactive to light. Conjunctivae are normal. No scleral icterus.  Neck: Normal range of motion. Neck supple. No JVD present.  Cardiovascular: Normal rate, regular rhythm and normal heart  sounds.  Exam reveals no gallop and no friction rub.   No murmur heard. Pulmonary/Chest: Effort normal and breath sounds normal. No respiratory distress. No wheezes.No rales.  Abdominal: Soft. Bowel sounds are normal. No distension. There is no tenderness. There is no guarding.  Musculoskeletal: No edema or tenderness.  Lymphadenopathy: No cervical or supraclavicular adenopathy.  Neurological: Alert and oriented to person, place, and time. No cranial nerve deficit.  Skin: Skin is warm and dry. No rash noted. No erythema. No pallor.  Psychiatric: Affect and judgment normal.   Labs No visits with results within 3 Day(s) from this visit.  Latest known visit with results is:  Appointment  on 10/25/2017  Component Date Value Ref Range Status  . WBC 10/25/2017 6.8  4.0 - 10.5 K/uL Final  . RBC 10/25/2017 5.22  4.22 - 5.81 MIL/uL Final  . Hemoglobin 10/25/2017 15.7  13.0 - 17.0 g/dL Final  . HCT 10/25/2017 45.2  39.0 - 52.0 % Final  . MCV 10/25/2017 86.6  78.0 - 100.0 fL Final  . MCH 10/25/2017 30.1  26.0 - 34.0 pg Final  . MCHC 10/25/2017 34.7  30.0 - 36.0 g/dL Final  . RDW 10/25/2017 14.3  11.5 - 15.5 % Final  . Platelets 10/25/2017 101* 150 - 400 K/uL Final   Comment: SPECIMEN CHECKED FOR CLOTS PLATELET COUNT CONFIRMED BY SMEAR   . Neutrophils Relative % 10/25/2017 76  % Final  . Neutro Abs 10/25/2017 5.2  1.7 - 7.7 K/uL Final  . Lymphocytes Relative 10/25/2017 12  % Final  . Lymphs Abs 10/25/2017 0.8  0.7 - 4.0 K/uL Final  . Monocytes Relative 10/25/2017 8  % Final  . Monocytes Absolute 10/25/2017 0.5  0.1 - 1.0 K/uL Final  . Eosinophils Relative 10/25/2017 4  % Final  . Eosinophils Absolute 10/25/2017 0.2  0.0 - 0.7 K/uL Final  . Basophils Relative 10/25/2017 0  % Final  . Basophils Absolute 10/25/2017 0.0  0.0 - 0.1 K/uL Final   Performed at [facility], [address], [place], [place] [address]  . Sodium 10/25/2017 134* 135 - 145 mmol/L Final  . Potassium 10/25/2017 4.2  3.5 -  5.1 mmol/L Final  . Chloride 10/25/2017 99  98 - 111 mmol/L Final  . CO2 10/25/2017 26  22 - 32 mmol/L Final  . Glucose, Bld 10/25/2017 441* 70 - 99 mg/dL Final  . BUN 10/25/2017 13  8 - 23 mg/dL Final  . Creatinine, Ser 10/25/2017 1.03  0.61 - 1.24 mg/dL Final  . Calcium 10/25/2017 8.9  8.9 - 10.3 mg/dL Final  . Total Protein 10/25/2017 6.8  6.5 - 8.1 g/dL Final  . Albumin 10/25/2017 3.5  3.5 - 5.0 g/dL Final  . AST 10/25/2017 28  15 - 41 U/L Final  . ALT 10/25/2017 26  0 - 44 U/L Final  . Alkaline Phosphatase 10/25/2017 188* 38 - 126 U/L Final  . Total Bilirubin 10/25/2017 0.7  0.3 - 1.2 mg/dL Final  . GFR calc non Af Amer 10/25/2017 >60  >60 mL/min Final  . GFR calc Af Amer 10/25/2017 >60  >60 mL/min Final   Comment: (NOTE) The eGFR has been calculated using the CKD EPI equation. This calculation has not been validated in all clinical situations. eGFR's persistently <60 mL/min signify possible Chronic Kidney Disease.   Georgiann Hahn gap 10/25/2017 9  5 - 15 Final   Performed at [facility], [address] [place], [place] [address]     Pathology Orders Placed This Encounter  Procedures  . CT Biopsy    Standing Status:   Future    Standing Expiration Date:   10/28/2018    Order Specific Question:   Lab orders requested (DO NOT place separate lab orders, these will be automatically ordered during procedure specimen collection):    Answer:   Surgical Pathology    Order Specific Question:   Reason for Exam (SYMPTOM  OR DIAGNOSIS REQUIRED)    Answer:   evaluate for possible liver biopsy    Order Specific Question:   Preferred imaging location?    Answer:   [facility]    Order Specific Question:  Radiology Contrast Protocol - do NOT remove file path    Answer:   [email or web address]       Zoila Shutter MD

## 2017-10-28 NOTE — Patient Instructions (Signed)
[facility] Discharge Instructions  You saw Dr. Higgs today.   Thank you for choosing [facility] at [facility] to provide your oncology and hematology care.  To afford each patient quality time with our provider, please arrive at least 15 minutes before your scheduled appointment time.   If you have a lab appointment with the Cancer Center please come in thru the  Main Entrance and check in at the main information desk  You need to re-schedule your appointment should you arrive 10 or more minutes late.  We strive to give you quality time with our providers, and arriving late affects you and other patients whose appointments are after yours.  Also, if you no show three or more times for appointments you may be dismissed from the clinic at the providers discretion.     Again, thank you for choosing [facility].  Our hope is that these requests will decrease the amount of time that you wait before being seen by our physicians.       _____________________________________________________________  Should you have questions after your visit to [facility], please contact our office at [phone] between the hours of 8:00 a.m. and 4:30 p.m.  Voicemails left after 4:00 p.m. will not be returned until the following business day.  For prescription refill requests, have your pharmacy contact our office and allow 72 hours.    Cancer Center Support Programs:   > Cancer Support Group  2nd Tuesday of the month 1pm-2pm, Journey Room    

## 2017-11-01 ENCOUNTER — Other Ambulatory Visit (HOSPITAL_COMMUNITY): Payer: Self-pay | Admitting: Internal Medicine

## 2017-11-01 DIAGNOSIS — C159 Malignant neoplasm of esophagus, unspecified: Secondary | ICD-10-CM

## 2017-11-02 DIAGNOSIS — I2510 Atherosclerotic heart disease of native coronary artery without angina pectoris: Secondary | ICD-10-CM | POA: Diagnosis not present

## 2017-11-02 DIAGNOSIS — F319 Bipolar disorder, unspecified: Secondary | ICD-10-CM | POA: Diagnosis not present

## 2017-11-02 DIAGNOSIS — C159 Malignant neoplasm of esophagus, unspecified: Secondary | ICD-10-CM | POA: Diagnosis not present

## 2017-11-02 DIAGNOSIS — Z23 Encounter for immunization: Secondary | ICD-10-CM | POA: Diagnosis not present

## 2017-11-02 DIAGNOSIS — E1121 Type 2 diabetes mellitus with diabetic nephropathy: Secondary | ICD-10-CM | POA: Diagnosis not present

## 2017-11-03 ENCOUNTER — Ambulatory Visit (INDEPENDENT_AMBULATORY_CARE_PROVIDER_SITE_OTHER): Payer: Medicare Other | Admitting: "Endocrinology

## 2017-11-03 ENCOUNTER — Encounter: Payer: Self-pay | Admitting: "Endocrinology

## 2017-11-03 VITALS — BP 122/89 | HR 90 | Ht 64.0 in | Wt 190.0 lb

## 2017-11-03 DIAGNOSIS — E782 Mixed hyperlipidemia: Secondary | ICD-10-CM

## 2017-11-03 DIAGNOSIS — I10 Essential (primary) hypertension: Secondary | ICD-10-CM

## 2017-11-03 DIAGNOSIS — E1159 Type 2 diabetes mellitus with other circulatory complications: Secondary | ICD-10-CM

## 2017-11-03 DIAGNOSIS — I25118 Atherosclerotic heart disease of native coronary artery with other forms of angina pectoris: Secondary | ICD-10-CM

## 2017-11-03 MED ORDER — GLIPIZIDE ER 5 MG PO TB24: 5.0000 mg | ORAL_TABLET | Freq: Every day | ORAL | 2 refills | Status: DC

## 2017-11-03 NOTE — Patient Instructions (Signed)

## 2017-11-03 NOTE — Progress Notes (Signed)
Endocrinology follow-up note       11/03/2017, 1:53 PM   Subjective:    Patient ID: [name], male    DOB: 08/05/1950.  [name] is being seen in follow-up for management of currently uncontrolled implicated, symptomatic type 2 diabetes, hyperlipidemia, hypertension. PMD:   Sinda Du, MD.   Past Medical History:  Diagnosis Date  . Arthritis   . Bipolar disorder ([place])   . CAD (coronary artery disease) 08/16/2016  . Cellulitis and abscess of foot 03/05/2016  . Chronic combined systolic and diastolic heart failure ([place])   . Diabetes mellitus without complication ([place])    boderline  . GERD (gastroesophageal reflux disease)   . Heart murmur 1995  . History of kidney stones   . Hypercholesteremia   . Hypertension   . Kidney stones   . Myocardial infarction ([place])   . OSA (obstructive sleep apnea)    no cpap, can't tolerate  . Schizoaffective disorder, bipolar type ([place]) 04/25/2016   Past Surgical History:  Procedure Laterality Date  . APPENDECTOMY    . BIOPSY  03/09/2017   Procedure: BIOPSY;  Surgeon: Danie Binder, MD;  [facility];  Service: Endoscopy;;  gastric esophageal  . CHOLECYSTECTOMY N/A 10/20/2013   Procedure: LAPAROSCOPIC CHOLECYSTECTOMY;  Surgeon: [name], MD;  Location: [facility];  Service: General;  Laterality: N/A;  . COLONOSCOPY WITH PROPOFOL N/A 03/09/2017   Procedure: COLONOSCOPY WITH PROPOFOL;  Surgeon: Danie Binder, MD;  [facility];  Service: Endoscopy;  Laterality: N/A;  12:15pm  . ESOPHAGOGASTRODUODENOSCOPY (EGD) WITH PROPOFOL N/A 03/09/2017   Procedure: ESOPHAGOGASTRODUODENOSCOPY (EGD) WITH PROPOFOL;  Surgeon: Danie Binder, MD;  [facility];  Service: Endoscopy;  Laterality: N/A;  . EUS N/A 04/08/2017   Procedure: UPPER ENDOSCOPIC ULTRASOUND (EUS) RADIAL;  Surgeon: Milus Banister, MD;  Location: [facility];   Service: Endoscopy;  Laterality: N/A;  . HIP SURGERY Left   . KNEE SURGERY Left   . POLYPECTOMY  03/09/2017   Procedure: POLYPECTOMY;  Surgeon: Danie Binder, MD;  [facility];  Service: Endoscopy;;  colon   . PORTACATH PLACEMENT Left 04/07/2017   Procedure: INSERTION PORT-A-CATH LEFT SUBCLAVIAN;  Surgeon: Virl Cagey, MD;  Location: [facility];  Service: General;  Laterality: Left;  . SAVORY DILATION N/A 03/09/2017   Procedure: SAVORY DILATION;  Surgeon: Danie Binder, MD;  [facility];  Service: Endoscopy;  Laterality: N/A;   Social History   Socioeconomic History  . Marital status: Divorced    Spouse name: Not on file  . Number of children: Not on file  . Years of education: Not on file  . Highest education level: Not on file  Occupational History  . Not on file  Social Needs  . Financial resource strain: Not on file  . Food insecurity:    Worry: Not on file    Inability: Not on file  . Transportation needs:    Medical: Not on file    Non-medical: Not on file  Tobacco Use  . Smoking status: Never Smoker  . Smokeless tobacco:  Never Used  Substance and Sexual Activity  . Alcohol use: No  . Drug use: No  . Sexual activity: Not Currently    Birth control/protection: Implant  Lifestyle  . Physical activity:    Days per week: Not on file    Minutes per session: Not on file  . Stress: Not on file  Relationships  . Social connections:    Talks on phone: Not on file    Gets together: Not on file    Attends religious service: Not on file    Active member of club or organization: Not on file    Attends meetings of clubs or organizations: Not on file    Relationship status: Not on file  Other Topics Concern  . Not on file  Social History Narrative  . Not on file   Outpatient Encounter Medications as of 11/03/2017  Medication Sig  . acetaminophen (TYLENOL) 325 MG tablet Take 650 mg by mouth every 6 (six) hours as needed.  . AMITIZA 24 MCG  capsule Take 24 mcg by mouth 2 (two) times a week.   Marland Kitchen aspirin EC 81 MG tablet Take 81 mg by mouth daily.  . Blood Glucose Monitoring Suppl (ONETOUCH VERIO) w/Device KIT 1 each by Does not apply route as needed.  . carvedilol (COREG) 12.5 MG tablet TAKE 1 TABLET(12.5 MG) BY MOUTH TWICE DAILY  . ENTRESTO 49-51 MG TAKE 1 TABLET BY MOUTH TWICE DAILY  . furosemide (LASIX) 40 MG tablet Alternate 40 mg one day and 20 mg the next (Patient taking differently: 40 mg daily. )  . glipiZIDE (GLUCOTROL XL) 5 MG 24 hr tablet Take 1 tablet (5 mg total) by mouth daily with breakfast.  . glucose blood test strip 1 each by Other route 4 (four) times daily. Use as instructed qid One Touch ultra  . linagliptin (TRADJENTA) 5 MG TABS tablet Take 5 mg by mouth daily.  . metFORMIN (GLUCOPHAGE) 500 MG tablet TAKE 1 TABLET(500 MG) BY MOUTH TWICE DAILY WITH A MEAL  . OLANZapine (ZYPREXA) 5 MG tablet Take 5 mg by mouth 2 (two) times daily.  . ondansetron (ZOFRAN ODT) 8 MG disintegrating tablet Take 1 tablet (8 mg total) by mouth every 8 (eight) hours as needed for nausea or vomiting.  . pantoprazole (PROTONIX) 40 MG tablet Take 1 tablet (40 mg total) by mouth 2 (two) times daily before a meal.  . potassium chloride SA (K-DUR,KLOR-CON) 20 MEQ tablet Take 20 mEq by mouth daily.  . rosuvastatin (CRESTOR) 10 MG tablet Take 1 tablet (10 mg total) by mouth daily.  . [DISCONTINUED] dexlansoprazole (DEXILANT) 60 MG capsule Take 1 capsule (60 mg total) by mouth daily.  . [DISCONTINUED] oxyCODONE (ROXICODONE) 5 MG immediate release tablet Take 1 tablet (5 mg total) by mouth every 4 (four) hours as needed.  . [DISCONTINUED] rosuvastatin (CRESTOR) 10 MG tablet TAKE 1 TABLET(10 MG) BY MOUTH DAILY   No facility-administered encounter medications on file as of 11/03/2017.     ALLERGIES: Allergies  Allergen Reactions  . Codeine Nausea And Vomiting  . Morphine And Related Nausea And Vomiting    VACCINATION STATUS: Immunization  History  Administered Date(s) Administered  . Pneumococcal Polysaccharide-23 03/06/2016    Diabetes  He presents for his follow-up diabetic visit. He has type 2 diabetes mellitus. Onset time: He was diagnosed at approximate age of 55 years. His disease course has been worsening. There are no hypoglycemic associated symptoms. Pertinent negatives for hypoglycemia include no confusion, headaches, pallor or  seizures. Associated symptoms include polydipsia and polyuria. Pertinent negatives for diabetes include no chest pain, no fatigue, no polyphagia and no weakness. There are no hypoglycemic complications. Symptoms are worsening. Diabetic complications include heart disease and nephropathy. Risk factors for coronary artery disease include diabetes mellitus, dyslipidemia, hypertension, male sex, family history and sedentary lifestyle. Current diabetic treatment includes oral agent (monotherapy). His weight is increasing steadily. He is following a generally unhealthy diet. When asked about meal planning, he reported none. He has not had a previous visit with a dietitian. He rarely participates in exercise. His overall blood glucose range is >200 mg/dl. (He monitors only rarely has 10 readings in the last 30 days averaging 404.)  Hyperlipidemia  This is a chronic problem. The current episode started more than 1 year ago. The problem is uncontrolled. Recent lipid tests were reviewed and are high. Exacerbating diseases include chronic renal disease and diabetes. Pertinent negatives include no chest pain, myalgias or shortness of breath. Current antihyperlipidemic treatment includes statins. Risk factors for coronary artery disease include diabetes mellitus, dyslipidemia, hypertension, male sex and a sedentary lifestyle.  Hypertension  This is a chronic problem. The current episode started more than 1 year ago. The problem is controlled. Pertinent negatives include no chest pain, headaches, neck pain, palpitations  or shortness of breath. Risk factors for coronary artery disease include dyslipidemia, diabetes mellitus, family history, male gender and sedentary lifestyle. Identifiable causes of hypertension include chronic renal disease.      Review of Systems  Constitutional: Negative for chills, fatigue, fever and unexpected weight change.  HENT: Negative for dental problem, mouth sores and trouble swallowing.   Eyes: Negative for visual disturbance.  Respiratory: Negative for cough, choking, chest tightness, shortness of breath and wheezing.   Cardiovascular: Negative for chest pain, palpitations and leg swelling.  Gastrointestinal: Negative for abdominal distention, abdominal pain, constipation, diarrhea, nausea and vomiting.  Endocrine: Positive for polydipsia and polyuria. Negative for polyphagia.  Genitourinary: Negative for dysuria, flank pain, hematuria and urgency.  Musculoskeletal: Negative for back pain, gait problem, myalgias and neck pain.  Skin: Negative for pallor, rash and wound.  Neurological: Negative for seizures, syncope, weakness, numbness and headaches.       Hard of hearing.  Psychiatric/Behavioral: Negative for confusion and dysphoric mood.    Objective:    BP 122/89   Pulse 90   Ht 5' 4"  (1.626 m)   Wt 190 lb (86.2 kg)   BMI 32.61 kg/m   Wt Readings from Last 3 Encounters:  11/03/17 190 lb (86.2 kg)  10/28/17 187 lb 14.4 oz (85.2 kg)  09/13/17 187 lb 9.6 oz (85.1 kg)     Physical Exam  Constitutional: He is oriented to person, place, and time. He appears well-developed and well-nourished. He is cooperative. No distress.  HENT:  Head: Normocephalic and atraumatic.  Eyes: EOM are normal.  Neck: Normal range of motion. Neck supple. No tracheal deviation present. No thyromegaly present.  Cardiovascular: Normal rate, S1 normal, S2 normal and normal heart sounds. Exam reveals no gallop.  No murmur heard. Pulses:      Dorsalis pedis pulses are 1+ on the right side,  and 1+ on the left side.       Posterior tibial pulses are 1+ on the right side, and 1+ on the left side.  Pulmonary/Chest: Effort normal. No respiratory distress. He has no wheezes.  Abdominal: He exhibits no distension. There is no tenderness. There is no guarding and no CVA tenderness.  Musculoskeletal:  He exhibits no edema.       Right shoulder: He exhibits no swelling and no deformity.  Neurological: He is alert and oriented to person, place, and time. He has normal strength and normal reflexes. A sensory deficit is present. No cranial nerve deficit. Gait normal.  Patient is hard of hearing.  Skin: Skin is warm and dry. No rash noted. No cyanosis. Nails show no clubbing.  Psychiatric: He has a normal mood and affect. His speech is normal. Cognition and memory are normal.  Patient with significant cognitive deficit, he has positive effect.  His cousin is offering help.     CMP ( most recent) CMP     Component Value Date/Time   NA 134 (L) 10/25/2017 0922   K 4.2 10/25/2017 0922   CL 99 10/25/2017 0922   CO2 26 10/25/2017 0922   GLUCOSE 441 (H) 10/25/2017 0922   BUN 13 10/25/2017 0922   CREATININE 1.03 10/25/2017 0922   CREATININE 1.00 10/08/2017 1101   CALCIUM 8.9 10/25/2017 0922   PROT 6.8 10/25/2017 0922   ALBUMIN 3.5 10/25/2017 0922   AST 28 10/25/2017 0922   ALT 26 10/25/2017 0922   ALKPHOS 188 (H) 10/25/2017 0922   BILITOT 0.7 10/25/2017 0922   GFRNONAA >60 10/25/2017 0922   GFRNONAA 78 10/08/2017 1101   GFRAA >60 10/25/2017 0922   GFRAA 90 10/08/2017 1101     Diabetic Labs (most recent): Lab Results  Component Value Date   HGBA1C 11.5 (H) 10/08/2017   HGBA1C 11.2 (H) 05/28/2017   HGBA1C 8.0 (H) 08/16/2016    Lipid Panel     Component Value Date/Time   CHOL 182 08/17/2016 0517   TRIG 186 (H) 08/17/2016 0517   HDL 31 (L) 08/17/2016 0517   CHOLHDL 5.9 08/17/2016 0517   VLDL 37 08/17/2016 0517   LDLCALC 114 (H) 08/17/2016 0517      Lab Results   Component Value Date   TSH 0.65 05/28/2017   TSH 1.045 08/16/2016   TSH 1.396 03/05/2016   FREET4 1.3 05/28/2017      Assessment & Plan:   1. DM type 2 causing vascular disease ([place])  - [name] has currently uncontrolled symptomatic type 2 DM since 67 years of age. -He came with his meter and logs showing insistent hyperglycemia and A1c of 11.5%.    - recent CMP is reviewed showing improving renal function.      -his diabetes is complicated by coronary artery disease, CKD , cognitive deficit, inadequate social support, and [name] remains at a high risk for more acute and chronic complications which include CAD, CVA, CKD, retinopathy, and neuropathy. These are all discussed in detail with the patient.  - I have counseled him on diet management and weight loss, by adopting a carbohydrate restricted/protein rich diet.  -He admits to dietary indiscretion including consumption of large quantities of soda. -  Suggestion is made for him to avoid simple carbohydrates  from his diet including Cakes, Sweet Desserts / Pastries, Ice Cream, Soda (diet and regular), Sweet Tea, Candies, Chips, Cookies, Store Bought Juices, Alcohol in Excess of  1-2 drinks a day, Artificial Sweeteners, and "Sugar-free" Products. This will help patient to have stable blood glucose profile and potentially avoid unintended weight gain.   - I encouraged him to switch to  unprocessed or minimally processed complex starch and increased protein intake (animal or plant source), fruits, and vegetables.  - he is advised to stick to a routine  mealtimes to eat 3 meals  a day and avoid unnecessary snacks ( to snack only to correct hypoglycemia).   - he will be scheduled with Jearld Fenton, RDN, CDE for individualized diabetes education.  - I have approached him with the following individualized plan to manage diabetes and patient agrees:   -Based on his current significant glycemic burden, he would benefit  from at least basal insulin to achieve glycemic control.   - However, considering his evident cognitive deficit, inadequate social support, treatment with insulin will pose a significant challenge.    - Patient  is at risk of hypoglycemia if given insulin without enough supervision, and #1 priority in his care will be to avoid hypoglycemia. -I advised him to continue  metformin at 500 mg p.o. twice daily with meals, continue Tradjenta 5 mg p.o. daily with breakfast.   -I discussed and added Glucotrol XL 5 mg p.o. daily with breakfast.   -Patient is encouraged to call clinic for blood glucose levels less than 70 or above 300 mg /dl.  -It is unlikely for him to be able to execute insulin treatment.  If enough support cannot be arranged for him to obtain at home, he should be considered for placement in group home for intensive treatment with insulin. -He will continue to monitor blood glucose 2 times daily-before breakfast and at bedtime.  - Patient specific target  A1c;  LDL, HDL, Triglycerides, and  Waist Circumference were discussed in detail.  2) BP/HTN: His blood pressure is controlled to target.  He is advised to continue his current blood pressure medications including carvedilol 12.5 mg p.o. twice daily.      3) Lipids/HPL:   Controlled with LDL of 114.  He is advised to continue Crestor 10 mg p.o. q. at bedtime.  4)  Weight/Diet: CDE Consult will be initiated , exercise, and detailed carbohydrates information provided.  5) Chronic Care/Health Maintenance:  -he  Is on Statin medications and  is encouraged to continue to follow up with Ophthalmology, Dentist,  Podiatrist at least yearly or according to recommendations, and advised to  stay away from smoking. I have recommended yearly flu vaccine and pneumonia vaccination at least every 5 years; moderate intensity exercise for up to 150 minutes weekly; and  sleep for at least 7 hours a day.  - I advised patient to maintain close follow  up with Sinda Du, MD for primary care needs.  - Time spent with the patient: 25 min, of which >50% was spent in reviewing his blood glucose logs , discussing his hypo- and hyper-glycemic episodes, reviewing his current and  previous labs and insulin doses and developing a plan to avoid hypo- and hyper-glycemia. Please refer to Patient Instructions for Blood Glucose Monitoring and Insulin/Medications Dosing Guide"  in media tab for additional information. Ella Jubilee participated in the discussions, expressed understanding, and voiced agreement with the above plans.  All questions were answered to his satisfaction. he is encouraged to contact clinic should he have any questions or concerns prior to his return visit.   Follow up plan: - Return in about 3 months (around 02/02/2018) for Meter, and Logs, Follow up with Pre-visit Labs, Meter, and Logs.  Glade Lloyd, MD [facility] [address] [place], [place] [address] Phone: [phone]  Fax: [phone]    11/03/2017, 1:53 PM  This note was partially dictated with voice recognition software. Similar sounding words can be transcribed inadequately or may not  be  corrected upon review.

## 2017-11-08 ENCOUNTER — Other Ambulatory Visit: Payer: Self-pay | Admitting: Student

## 2017-11-09 ENCOUNTER — Other Ambulatory Visit: Payer: Self-pay

## 2017-11-09 ENCOUNTER — Ambulatory Visit (HOSPITAL_COMMUNITY)
Admission: RE | Admit: 2017-11-09 | Discharge: 2017-11-09 | Disposition: A | Discharge status: Home / Self Care | Payer: Medicare Other | Source: Ambulatory Visit | Attending: Internal Medicine | Admitting: Internal Medicine

## 2017-11-09 ENCOUNTER — Encounter (HOSPITAL_COMMUNITY): Payer: Self-pay

## 2017-11-09 DIAGNOSIS — Z7982 Long term (current) use of aspirin: Secondary | ICD-10-CM | POA: Insufficient documentation

## 2017-11-09 DIAGNOSIS — E7800 Pure hypercholesterolemia, unspecified: Secondary | ICD-10-CM | POA: Diagnosis not present

## 2017-11-09 DIAGNOSIS — K7689 Other specified diseases of liver: Secondary | ICD-10-CM | POA: Diagnosis not present

## 2017-11-09 DIAGNOSIS — Z803 Family history of malignant neoplasm of breast: Secondary | ICD-10-CM | POA: Insufficient documentation

## 2017-11-09 DIAGNOSIS — E119 Type 2 diabetes mellitus without complications: Secondary | ICD-10-CM | POA: Insufficient documentation

## 2017-11-09 DIAGNOSIS — Z818 Family history of other mental and behavioral disorders: Secondary | ICD-10-CM | POA: Insufficient documentation

## 2017-11-09 DIAGNOSIS — I252 Old myocardial infarction: Secondary | ICD-10-CM | POA: Insufficient documentation

## 2017-11-09 DIAGNOSIS — M1990 Unspecified osteoarthritis, unspecified site: Secondary | ICD-10-CM | POA: Insufficient documentation

## 2017-11-09 DIAGNOSIS — C787 Secondary malignant neoplasm of liver and intrahepatic bile duct: Secondary | ICD-10-CM | POA: Insufficient documentation

## 2017-11-09 DIAGNOSIS — Z885 Allergy status to narcotic agent status: Secondary | ICD-10-CM | POA: Insufficient documentation

## 2017-11-09 DIAGNOSIS — Z9049 Acquired absence of other specified parts of digestive tract: Secondary | ICD-10-CM | POA: Diagnosis not present

## 2017-11-09 DIAGNOSIS — C159 Malignant neoplasm of esophagus, unspecified: Secondary | ICD-10-CM | POA: Diagnosis not present

## 2017-11-09 DIAGNOSIS — C155 Malignant neoplasm of lower third of esophagus: Secondary | ICD-10-CM | POA: Insufficient documentation

## 2017-11-09 DIAGNOSIS — I5042 Chronic combined systolic (congestive) and diastolic (congestive) heart failure: Secondary | ICD-10-CM | POA: Diagnosis not present

## 2017-11-09 DIAGNOSIS — I700 Atherosclerosis of aorta: Secondary | ICD-10-CM | POA: Insufficient documentation

## 2017-11-09 DIAGNOSIS — Z9221 Personal history of antineoplastic chemotherapy: Secondary | ICD-10-CM | POA: Diagnosis not present

## 2017-11-09 DIAGNOSIS — G4733 Obstructive sleep apnea (adult) (pediatric): Secondary | ICD-10-CM | POA: Insufficient documentation

## 2017-11-09 DIAGNOSIS — Z7984 Long term (current) use of oral hypoglycemic drugs: Secondary | ICD-10-CM | POA: Diagnosis not present

## 2017-11-09 DIAGNOSIS — R918 Other nonspecific abnormal finding of lung field: Secondary | ICD-10-CM | POA: Insufficient documentation

## 2017-11-09 DIAGNOSIS — Z9889 Other specified postprocedural states: Secondary | ICD-10-CM | POA: Insufficient documentation

## 2017-11-09 DIAGNOSIS — F319 Bipolar disorder, unspecified: Secondary | ICD-10-CM | POA: Diagnosis not present

## 2017-11-09 DIAGNOSIS — Z79899 Other long term (current) drug therapy: Secondary | ICD-10-CM | POA: Insufficient documentation

## 2017-11-09 DIAGNOSIS — Z8249 Family history of ischemic heart disease and other diseases of the circulatory system: Secondary | ICD-10-CM | POA: Insufficient documentation

## 2017-11-09 DIAGNOSIS — I2510 Atherosclerotic heart disease of native coronary artery without angina pectoris: Secondary | ICD-10-CM | POA: Diagnosis not present

## 2017-11-09 DIAGNOSIS — K219 Gastro-esophageal reflux disease without esophagitis: Secondary | ICD-10-CM | POA: Insufficient documentation

## 2017-11-09 DIAGNOSIS — D696 Thrombocytopenia, unspecified: Secondary | ICD-10-CM | POA: Insufficient documentation

## 2017-11-09 DIAGNOSIS — I110 Hypertensive heart disease with heart failure: Secondary | ICD-10-CM | POA: Diagnosis not present

## 2017-11-09 DIAGNOSIS — Z08 Encounter for follow-up examination after completed treatment for malignant neoplasm: Secondary | ICD-10-CM | POA: Insufficient documentation

## 2017-11-09 DIAGNOSIS — Z923 Personal history of irradiation: Secondary | ICD-10-CM | POA: Diagnosis not present

## 2017-11-09 DIAGNOSIS — N400 Enlarged prostate without lower urinary tract symptoms: Secondary | ICD-10-CM | POA: Insufficient documentation

## 2017-11-09 LAB — PROTIME-INR
INR: 0.88
Prothrombin Time: 11.9 seconds (ref 11.4–15.2)

## 2017-11-09 LAB — APTT: aPTT: 24 seconds (ref 24–36)

## 2017-11-09 LAB — CBC
HCT: 47.2 % (ref 39.0–52.0)
HEMOGLOBIN: 16.5 g/dL (ref 13.0–17.0)
MCH: 30.1 pg (ref 26.0–34.0)
MCHC: 35 g/dL (ref 30.0–36.0)
MCV: 86 fL (ref 78.0–100.0)
Platelets: 116 10*3/uL — ABNORMAL LOW (ref 150–400)
RBC: 5.49 MIL/uL (ref 4.22–5.81)
RDW: 14.7 % (ref 11.5–15.5)
WBC: 7.7 10*3/uL (ref 4.0–10.5)

## 2017-11-09 LAB — GLUCOSE, CAPILLARY: Glucose-Capillary: 336 mg/dL — ABNORMAL HIGH (ref 70–99)

## 2017-11-09 MED ORDER — MIDAZOLAM HCL 2 MG/2ML IJ SOLN
INTRAMUSCULAR | Status: AC
  Filled 2017-11-09: qty 4

## 2017-11-09 MED ORDER — FENTANYL CITRATE (PF) 100 MCG/2ML IJ SOLN
INTRAMUSCULAR | Status: AC | PRN
  Administered 2017-11-09 (×2): 50 ug via INTRAVENOUS

## 2017-11-09 MED ORDER — LIDOCAINE-EPINEPHRINE (PF) 2 %-1:200000 IJ SOLN
INTRAMUSCULAR | Status: AC
  Filled 2017-11-09: qty 20

## 2017-11-09 MED ORDER — SODIUM CHLORIDE 0.9 % IV SOLN
INTRAVENOUS | Status: DC
  Administered 2017-11-09: 12:00:00 via INTRAVENOUS

## 2017-11-09 MED ORDER — FENTANYL CITRATE (PF) 100 MCG/2ML IJ SOLN
INTRAMUSCULAR | Status: AC
  Filled 2017-11-09: qty 2

## 2017-11-09 MED ORDER — MIDAZOLAM HCL 2 MG/2ML IJ SOLN
INTRAMUSCULAR | Status: AC | PRN
  Administered 2017-11-09 (×2): 1 mg via INTRAVENOUS

## 2017-11-09 NOTE — Discharge Instructions (Signed)
Moderate Conscious Sedation, Adult, Care After °These instructions provide you with information about caring for yourself after your procedure. Your health care provider may also give you more specific instructions. Your treatment has been planned according to current medical practices, but problems sometimes occur. Call your health care provider if you have any problems or questions after your procedure. °What can I expect after the procedure? °After your procedure, it is common: °· To feel sleepy for several hours. °· To feel clumsy and have poor balance for several hours. °· To have poor judgment for several hours. °· To vomit if you eat too soon. ° °Follow these instructions at home: °For at least 24 hours after the procedure: ° °· Do not: °? Participate in activities where you could fall or become injured. °? Drive. °? Use heavy machinery. °? Drink alcohol. °? Take sleeping pills or medicines that cause drowsiness. °? Make important decisions or sign legal documents. °? Take care of children on your own. °· Rest. °Eating and drinking °· Follow the diet recommended by your health care provider. °· If you vomit: °? Drink water, juice, or soup when you can drink without vomiting. °? Make sure you have little or no nausea before eating solid foods. °General instructions °· Have a responsible adult stay with you until you are awake and alert. °· Take over-the-counter and prescription medicines only as told by your health care provider. °· If you smoke, do not smoke without supervision. °· Keep all follow-up visits as told by your health care provider. This is important. °Contact a health care provider if: °· You keep feeling nauseous or you keep vomiting. °· You feel light-headed. °· You develop a rash. °· You have a fever. °Get help right away if: °· You have trouble breathing. °This information is not intended to replace advice given to you by your health care provider. Make sure you discuss any questions you have  with your health care provider. °Document Released: 11/23/2012 Document Revised: 07/08/2015 Document Reviewed: 05/25/2015 °Elsevier Interactive Patient Education © 2018 [employer] ° ° °Liver Biopsy, Care After °These instructions give you information on caring for yourself after your procedure. Your doctor may also give you more specific instructions. Call your doctor if you have any problems or questions after your procedure. °Follow these instructions at home: °· Rest at home for 1-2 days or as told by your doctor. °· Have someone stay with you for at least 24 hours. °· Do not do these things in the first 24 hours: °? Drive. °? Use machinery. °? Take care of other people. °? Sign legal documents. °? Take a bath or shower. °· There are many different ways to close and cover a cut (incision). For example, a cut can be closed with stitches, skin glue, or adhesive strips. Follow your doctor's instructions on: °? Taking care of your cut. °? Changing and removing your bandage (dressing). °? Removing whatever was used to close your cut. °· Do not drink alcohol in the first week. °· Do not lift more than 5 pounds or play contact sports for the first 2 weeks. °· Take medicines only as told by your doctor. For 1 week, do not take medicine that has aspirin in it or medicines like ibuprofen. °· Get your test results. °Contact a doctor if: °· A cut bleeds and leaves more than just a small spot of blood. °· A cut is red, puffs up (swells), or hurts more than before. °· Fluid or something else   comes from a cut. °· A cut smells bad. °· You have a fever or chills. °Get help right away if: °· You have swelling, bloating, or pain in your belly (abdomen). °· You get dizzy or faint. °· You have a rash. °· You feel sick to your stomach (nauseous) or throw up (vomit). °· You have trouble breathing, feel short of breath, or feel faint. °· Your chest hurts. °· You have problems talking or seeing. °· You have trouble balancing or moving  your arms or legs. °This information is not intended to replace advice given to you by your health care provider. Make sure you discuss any questions you have with your health care provider. °Document Released: 11/12/2007 Document Revised: 07/11/2015 Document Reviewed: 03/31/2013 °Elsevier Interactive Patient Education © 2018 [employer] ° ° °

## 2017-11-09 NOTE — Consult Note (Signed)
Chief Complaint: Patient was seen in consultation today for image guided liver lesion biopsy  Referring Physician(s): Higgs,Vetta  Supervising Physician: Sandi Mariscal  Patient Status: [facility] - Out-pt  History of Present Illness: [name] is a 67 y.o. male with history of esophageal cancer, status post chemoradiation and recent follow-up imaging revealing disease progression with development of bilateral hepatic metastases.  He presents today for image guided liver lesion biopsy for further evaluation.  Additional medical history as below.  Past Medical History:  Diagnosis Date  . Arthritis   . Bipolar disorder ([place])   . CAD (coronary artery disease) 08/16/2016  . Cellulitis and abscess of foot 03/05/2016  . Chronic combined systolic and diastolic heart failure ([place])   . Diabetes mellitus without complication ([place])    boderline  . GERD (gastroesophageal reflux disease)   . Heart murmur 1995  . History of kidney stones   . Hypercholesteremia   . Hypertension   . Kidney stones   . Myocardial infarction ([place])   . OSA (obstructive sleep apnea)    no cpap, can't tolerate  . Schizoaffective disorder, bipolar type ([place]) 04/25/2016    Past Surgical History:  Procedure Laterality Date  . APPENDECTOMY    . BIOPSY  03/09/2017   Procedure: BIOPSY;  Surgeon: Danie Binder, MD;  Location: [facility] END[facility] SU[facility];  Service: Endoscopy;;  gastric esophageal  . CHOLECYSTECTOMY N/A 10/20/2013   Procedure: LAPAROSCOPIC CHOLECYSTECTOMY;  Surgeon: [name], MD;  Location: [facility] ORS;  Service: General;  Laterality: N/A;  . COLONOSCOPY WITH PROPOFOL N/A 03/09/2017   Procedure: COLONOSCOPY WITH PROPOFOL;  Surgeon: Danie Binder, MD;  Location: [facility];  Service: Endoscopy;  Laterality: N/A;  12:15pm  . ESOPHAGOGASTRODUODENOSCOPY (EGD) WITH PROPOFOL N/A 03/09/2017   Procedure: ESOPHAGOGASTRODUODENOSCOPY (EGD) WITH PROPOFOL;  Surgeon: Danie Binder, MD;  Location: [facility];  Service:  Endoscopy;  Laterality: N/A;  . EUS N/A 04/08/2017   Procedure: UPPER ENDOSCOPIC ULTRASOUND (EUS) RADIAL;  Surgeon: Milus Banister, MD;  Location: [facility] ENDOS[facility];  Service: Endoscopy;  Laterality: N/A;  . HIP SURGERY Left   . KNEE SURGERY Left   . POLYPECTOMY  03/09/2017   Procedure: POLYPECTOMY;  Surgeon: Danie Binder, MD;  Location: [facility];  Service: Endoscopy;;  colon   . PORTACATH PLACEMENT Left 04/07/2017   Procedure: INSERTION PORT-A-CATH LEFT SUBCLAVIAN;  Surgeon: Virl Cagey, MD;  Location: [facility] OR[facility];  Service: General;  Laterality: Left;  . SAVORY DILATION N/A 03/09/2017   Procedure: SAVORY DILATION;  Surgeon: Danie Binder, MD;  Location: [facility];  Service: Endoscopy;  Laterality: N/A;    Allergies: Codeine and Morphine and related  Medications: Prior to Admission medications   Medication Sig Start Date End Date Taking? Authorizing Provider  aspirin EC 81 MG tablet Take 81 mg by mouth daily.   Yes [provider]  carvedilol (COREG) 12.5 MG tablet TAKE 1 TABLET(12.5 MG) BY MOUTH TWICE DAILY 09/02/17  Yes Lendon Colonel, NP  ENTRESTO 49-51 MG TAKE 1 TABLET BY MOUTH TWICE DAILY 07/21/17  Yes Herminio Commons, MD  furosemide (LASIX) 40 MG tablet Alternate 40 mg one day and 20 mg the next Patient taking differently: 40 mg daily.  07/17/16  Yes Herminio Commons, MD  glipiZIDE (GLUCOTROL XL) 5 MG 24 hr tablet Take 1 tablet (5 mg total) by mouth daily with breakfast. 11/03/17  Yes Nida, Marella Chimes, MD  glucose blood test strip 1 each by  Other route 4 (four) times daily. Use as instructed qid One Touch ultra 06/22/17  Yes Nida, Marella Chimes, MD  metFORMIN (GLUCOPHAGE) 500 MG tablet TAKE 1 TABLET(500 MG) BY MOUTH TWICE DAILY WITH A MEAL 09/22/17  Yes Nida, Marella Chimes, MD  milk thistle 175 MG tablet Take 175 mg by mouth daily.   Yes [provider]  OLANZapine (ZYPREXA) 5 MG tablet Take 5 mg by mouth 2 (two) times daily.   Yes [provider]  ondansetron (ZOFRAN ODT) 8 MG disintegrating tablet Take 1 tablet (8 mg total) by mouth every 8 (eight) hours as needed for nausea or vomiting. 04/19/17  Yes Higgs, Mathis Dad, MD  pantoprazole (PROTONIX) 40 MG tablet Take 1 tablet (40 mg total) by mouth 2 (two) times daily before a meal. 09/13/17  Yes Walden Field A, NP  potassium chloride SA (K-DUR,KLOR-CON) 20 MEQ tablet Take 20 mEq by mouth daily.   Yes [provider]  rosuvastatin (CRESTOR) 10 MG tablet Take 1 tablet (10 mg total) by mouth daily. 09/20/17  Yes Herminio Commons, MD  acetaminophen (TYLENOL) 325 MG tablet Take 650 mg by mouth every 6 (six) hours as needed.    [provider]  AMITIZA 24 MCG capsule Take 24 mcg by mouth 2 (two) times a week.  08/06/16   [provider]  Blood Glucose Monitoring Suppl (ONETOUCH VERIO) w/Device KIT 1 each by Does not apply route as needed. 05/27/17   Cassandria Anger, MD  linagliptin (TRADJENTA) 5 MG TABS tablet Take 5 mg by mouth daily.    [provider]     Family History  Problem Relation Age of Onset  . Hypertension Mother   . Dementia Mother   . Cancer Mother        breast  . Colon cancer Neg Hx   . Colon polyps Neg Hx     Social History   Socioeconomic History  . Marital status: Divorced    Spouse name: Not on file  . Number of children: Not on file  . Years of education: Not on file  . Highest education level: Not on file  Occupational History  . Not on file  Social Needs  . Financial resource strain: Not on file  . Food insecurity:    Worry: Not on file    Inability: Not on file  . Transportation needs:    Medical: Not on file    Non-medical: Not on file  Tobacco Use  . Smoking status: Never Smoker  . Smokeless tobacco: Never Used  Substance and Sexual Activity  . Alcohol use: No  . Drug use: No  . Sexual activity: Not Currently    Birth control/protection: Implant  Lifestyle  . Physical activity:    Days per  week: Not on file    Minutes per session: Not on file  . Stress: Not on file  Relationships  . Social connections:    Talks on phone: Not on file    Gets together: Not on file    Attends religious service: Not on file    Active member of club or organization: Not on file    Attends meetings of clubs or organizations: Not on file    Relationship status: Not on file  Other Topics Concern  . Not on file  Social History Narrative  . Not on file      Review of Systems denies fever, headache, chest pain, worsening dyspnea, back pain or bleeding.  He  does have occasional cough, intermittent nausea /vomiting, some mild generalized abdominal discomfort and bilateral foot discomfort with prolonged ambulation  Vital Signs: BP (!) 144/96   Pulse 86   Temp 97.7 F (36.5 C) (Oral)   Resp 18   SpO2 96%   Physical Exam awake, alert.  Chest clear to auscultation bilaterally anteriorly.  Heart with nl rate with occasional ectopy.  Abdomen obese, soft, mild generalized tenderness to palpation, positive bowel sounds.  No significant lower extremity edema.  Imaging: Ct Chest W Contrast  Result Date: 10/25/2017 CLINICAL DATA:  Adenocarcinoma of the distal esophagus. Thrombocytopenia. Diabetes. EXAM: CT CHEST, ABDOMEN, AND PELVIS WITH CONTRAST TECHNIQUE: Multidetector CT imaging of the chest, abdomen and pelvis was performed following the standard protocol during bolus administration of intravenous contrast. CONTRAST:  137m ISOVUE-300 IOPAMIDOL (ISOVUE-300) INJECTION 61% COMPARISON:  07/19/2017 FINDINGS: CT CHEST FINDINGS Cardiovascular: Left Port-A-Cath which terminates at the low SVC. Tortuous thoracic aorta. Normal heart size, without pericardial effusion. Multivessel coronary artery atherosclerosis. No central pulmonary embolism, on this non-dedicated study. Mediastinum/Nodes: No supraclavicular adenopathy. No mediastinal or hilar adenopathy.Moderate distal esophageal wall thickening is felt to be  similar, including on image 38/2. No secondary signs of obstruction. Lungs/Pleura: No pleural fluid. Paramediastinal patchy areas of airspace disease are greater on the left. The left-sided pulmonary opacity likely obscures the 4 mm left lower lobe pulmonary nodule detailed on the prior. Musculoskeletal: No acute osseous abnormality. CT ABDOMEN PELVIS FINDINGS Hepatobiliary: Interval development of multiple hepatic metastasis. Example high right a liver lobe 1.6 cm lesion image 45/2. A caudate lobe 1.3 cm hypoattenuating lesion image 46/2. Segment 4A 2.5 cm lesion image 47/2. Cholecystectomy, without biliary ductal dilatation. Pancreas: Fatty replacement involving the pancreatic head and uncinate process. Spleen: Normal in size, without focal abnormality. Adrenals/Urinary Tract: Normal adrenal glands. Mild to moderate left renal atrophy. Too small to characterize lesions in both kidneys. Normal urinary bladder. Stomach/Bowel: Gastric antral underdistention. Tiny periampullary duodenal diverticulum. Otherwise normal colon. Scattered colonic diverticula. Normal terminal ileum. Vascular/Lymphatic: Advanced aortic and branch vessel atherosclerosis. Left-sided IVC. A portal caval node measures 11 mm on image 57/2 versus 2-3 mm on the prior. Has a suggestion of central necrosis. Reproductive: Mild prostatomegaly. Other: No significant free fluid. Periumbilical fat containing hernia is diminutive. Musculoskeletal: Presumed bone islands in the left hemipelvis. IMPRESSION: 1. Disease progression, as evidenced by development of bilateral hepatic metastasis. 2. Enlargement of a portal caval node, also suspicious for metastatic disease. 3. Similar distal esophageal wall thickening, without secondary signs of obstruction. 4. Paramediastinal pulmonary opacities are indeterminate, but new since the prior. Evolving radiation change could have this appearance. If no history of radiation therapy, considerations include infection or  aspiration. 5. The previously described 4 mm left lower lobe pulmonary nodule is likely obscured by airspace disease in this region. 6. Coronary artery atherosclerosis. Aortic Atherosclerosis (ICD10-I70.0). Electronically Signed   By: KAbigail MiyamotoM.D.   On: 10/25/2017 11:46   Ct Abdomen Pelvis W Contrast  Result Date: 10/25/2017 CLINICAL DATA:  Adenocarcinoma of the distal esophagus. Thrombocytopenia. Diabetes. EXAM: CT CHEST, ABDOMEN, AND PELVIS WITH CONTRAST TECHNIQUE: Multidetector CT imaging of the chest, abdomen and pelvis was performed following the standard protocol during bolus administration of intravenous contrast. CONTRAST:  1065mISOVUE-300 IOPAMIDOL (ISOVUE-300) INJECTION 61% COMPARISON:  07/19/2017 FINDINGS: CT CHEST FINDINGS Cardiovascular: Left Port-A-Cath which terminates at the low SVC. Tortuous thoracic aorta. Normal heart size, without pericardial effusion. Multivessel coronary artery atherosclerosis. No central pulmonary embolism, on this non-dedicated study. Mediastinum/Nodes:  No supraclavicular adenopathy. No mediastinal or hilar adenopathy.Moderate distal esophageal wall thickening is felt to be similar, including on image 38/2. No secondary signs of obstruction. Lungs/Pleura: No pleural fluid. Paramediastinal patchy areas of airspace disease are greater on the left. The left-sided pulmonary opacity likely obscures the 4 mm left lower lobe pulmonary nodule detailed on the prior. Musculoskeletal: No acute osseous abnormality. CT ABDOMEN PELVIS FINDINGS Hepatobiliary: Interval development of multiple hepatic metastasis. Example high right a liver lobe 1.6 cm lesion image 45/2. A caudate lobe 1.3 cm hypoattenuating lesion image 46/2. Segment 4A 2.5 cm lesion image 47/2. Cholecystectomy, without biliary ductal dilatation. Pancreas: Fatty replacement involving the pancreatic head and uncinate process. Spleen: Normal in size, without focal abnormality. Adrenals/Urinary Tract: Normal adrenal  glands. Mild to moderate left renal atrophy. Too small to characterize lesions in both kidneys. Normal urinary bladder. Stomach/Bowel: Gastric antral underdistention. Tiny periampullary duodenal diverticulum. Otherwise normal colon. Scattered colonic diverticula. Normal terminal ileum. Vascular/Lymphatic: Advanced aortic and branch vessel atherosclerosis. Left-sided IVC. A portal caval node measures 11 mm on image 57/2 versus 2-3 mm on the prior. Has a suggestion of central necrosis. Reproductive: Mild prostatomegaly. Other: No significant free fluid. Periumbilical fat containing hernia is diminutive. Musculoskeletal: Presumed bone islands in the left hemipelvis. IMPRESSION: 1. Disease progression, as evidenced by development of bilateral hepatic metastasis. 2. Enlargement of a portal caval node, also suspicious for metastatic disease. 3. Similar distal esophageal wall thickening, without secondary signs of obstruction. 4. Paramediastinal pulmonary opacities are indeterminate, but new since the prior. Evolving radiation change could have this appearance. If no history of radiation therapy, considerations include infection or aspiration. 5. The previously described 4 mm left lower lobe pulmonary nodule is likely obscured by airspace disease in this region. 6. Coronary artery atherosclerosis. Aortic Atherosclerosis (ICD10-I70.0). Electronically Signed   By: Abigail Miyamoto M.D.   On: 10/25/2017 11:46    Labs:  CBC: Recent Labs    06/03/17 0923 06/18/17 0943 10/25/17 0922 11/09/17 1137  WBC 5.2 7.7 6.8 7.7  HGB 13.7 15.1 15.7 16.5  HCT 39.1 45.3 45.2 47.2  PLT 90* 129* 101* 116*    COAGS: Recent Labs    11/09/17 1137  INR 0.88  APTT 24    BMP: Recent Labs    06/03/17 0923 06/18/17 0943 10/08/17 1101 10/25/17 0922  NA 130* 140 138 134*  K 3.6 4.2 4.0 4.2  CL 95* 103 100 99  CO2 24 27 28 26   GLUCOSE 377* 160* 291* 441*  BUN 12 8 13 13   CALCIUM 8.3* 9.1 9.1 8.9  CREATININE 1.19 1.07  1.00 1.03  GFRNONAA >60 >60 78 >60  GFRAA >60 >60 90 >60    LIVER FUNCTION TESTS: Recent Labs    05/28/17 0928  06/03/17 0923 06/18/17 0943 10/08/17 1101 10/25/17 0922  BILITOT 0.5   < > 0.8 0.9 0.5 0.7  AST 18   < > 20 23 21 28   ALT 10*   < > 12* 16* 20 26  ALKPHOS 98  --  80 109  --  188*  PROT 6.3*   < > 6.2* 7.0 6.3 6.8  ALBUMIN 3.1*  --  2.9* 3.4*  --  3.5   < > = values in this interval not displayed.    TUMOR MARKERS: No results for input(s): AFPTM, CEA, CA199, CHROMGRNA in the last 8760 hours.  Assessment and Plan: 67 y.o. male with history of esophageal cancer, status post chemoradiation and recent follow-up imaging revealing disease  progression with development of bilateral hepatic metastases.  He presents today for image guided liver lesion biopsy for further evaluation.Risks and benefits discussed with the patient including, but not limited to bleeding, infection, damage to adjacent structures or low yield requiring additional tests.  All of the patient's questions were answered, patient is agreeable to proceed. Consent signed and in chart.     Thank you for this interesting consult.  I greatly enjoyed meeting [name] and look forward to participating in their care.  A copy of this report was sent to the requesting provider on this date.  Electronically Signed: D. Rowe Robert, PA-C 11/09/2017, 12:38 PM   I spent a total of  25 minutes   in face to face in clinical consultation, greater than 50% of which was counseling/coordinating care for image guided liver lesion biopsy

## 2017-11-09 NOTE — Procedures (Signed)
Pre Procedure Dx: Liver lesion Post Procedural Dx: Same  Technically successful US guided biopsy of indeterminate lesion within the left lobe of the liver.   EBL: None  No immediate complications.   Ronny Bacon, MD Pager #: [phone]

## 2017-11-15 ENCOUNTER — Ambulatory Visit (HOSPITAL_COMMUNITY): Payer: Self-pay | Admitting: Hematology

## 2017-11-15 NOTE — Patient Instructions (Signed)
[name]  11/15/2017     @PREFPERIOPPHARMACY @   Your procedure is scheduled on  11/23/2017.  Report to [facility] at  830  A.M.  Call this number if you have problems the morning of surgery:  [phone]   Remember:  Do not eat or drink after midnight.  You may drink clear liquids until  (follow the instructions given to you) .  Clear liquids allowed are:                    Water, Juice (non-citric and without pulp), Carbonated beverages, Clear Tea, Black Coffee only, Plain Jell-O only, Gatorade and Plain Popsicles only    Take these medicines the morning of surgery with A SIP OF WATER  Carvedilol, entresto, olanzapine, zofran, protonix.    Do not wear jewelry, make-up or nail polish.  Do not wear lotions, powders, or perfumes, or deodorant.  Do not shave 48 hours prior to surgery.  Men may shave face and neck.  Do not bring valuables to the hospital.  [facility] is not responsible for any belongings or valuables.  Contacts, dentures or bridgework may not be worn into surgery.  Leave your suitcase in the car.  After surgery it may be brought to your room.  For patients admitted to the hospital, discharge time will be determined by your treatment team.  Patients discharged the day of surgery will not be allowed to drive home.   Name and phone number of your driver:   family Special instructions:  Follow the diet and prep instructions given to you by Dr Nona Dell office.  Please read over the following fact sheets that you were given. Anesthesia Post-op Instructions and Care and Recovery After Surgery       Esophagogastroduodenoscopy Esophagogastroduodenoscopy (EGD) is a procedure to examine the lining of the esophagus, stomach, and first part of the small intestine (duodenum). This procedure is done to check for problems such as inflammation, bleeding, ulcers, or growths. During this procedure, a long, flexible, lighted tube with a camera attached  (endoscope) is inserted down the throat. Tell a health care provider about:  Any allergies you have.  All medicines you are taking, including vitamins, herbs, eye drops, creams, and over-the-counter medicines.  Any problems you or family members have had with anesthetic medicines.  Any blood disorders you have.  Any surgeries you have had.  Any medical conditions you have.  Whether you are pregnant or may be pregnant. What are the risks? Generally, this is a safe procedure. However, problems may occur, including:  Infection.  Bleeding.  A tear (perforation) in the esophagus, stomach, or duodenum.  Trouble breathing.  Excessive sweating.  Spasms of the larynx.  A slowed heartbeat.  Low blood pressure.  What happens before the procedure?  Follow instructions from your health care provider about eating or drinking restrictions.  Ask your health care provider about: ? Changing or stopping your regular medicines. This is especially important if you are taking diabetes medicines or blood thinners. ? Taking medicines such as aspirin and ibuprofen. These medicines can thin your blood. Do not take these medicines before your procedure if your health care provider instructs you not to.  Plan to have someone take you home after the procedure.  If you wear dentures, be ready to remove them before the procedure. What happens during the procedure?  To reduce your risk of infection, your health care team will  wash or sanitize their hands.  An IV tube will be put in a vein in your hand or arm. You will get medicines and fluids through this tube.  You will be given one or more of the following: ? A medicine to help you relax (sedative). ? A medicine to numb the area (local anesthetic). This medicine may be sprayed into your throat. It will make you feel more comfortable and keep you from gagging or coughing during the procedure. ? A medicine for pain.  A mouth guard may be  placed in your mouth to protect your teeth and to keep you from biting on the endoscope.  You will be asked to lie on your left side.  The endoscope will be lowered down your throat into your esophagus, stomach, and duodenum.  Air will be put into the endoscope. This will help your health care provider see better.  The lining of your esophagus, stomach, and duodenum will be examined.  Your health care provider may: ? Take a tissue sample so it can be looked at in a lab (biopsy). ? Remove growths. ? Remove objects (foreign bodies) that are stuck. ? Treat any bleeding with medicines or other devices that stop tissue from bleeding. ? Widen (dilate) or stretch narrowed areas of your esophagus and stomach.  The endoscope will be taken out. The procedure may vary among health care providers and hospitals. What happens after the procedure?  Your blood pressure, heart rate, breathing rate, and blood oxygen level will be monitored often until the medicines you were given have worn off.  Do not eat or drink anything until the numbing medicine has worn off and your gag reflex has returned. This information is not intended to replace advice given to you by your health care provider. Make sure you discuss any questions you have with your health care provider. Document Released: 06/05/2004 Document Revised: 07/11/2015 Document Reviewed: 12/27/2014 Elsevier Interactive Patient Education  2018 [employer]. Esophagogastroduodenoscopy, Care After Refer to this sheet in the next few weeks. These instructions provide you with information about caring for yourself after your procedure. Your health care provider may also give you more specific instructions. Your treatment has been planned according to current medical practices, but problems sometimes occur. Call your health care provider if you have any problems or questions after your procedure. What can I expect after the procedure? After the procedure,  it is common to have:  A sore throat.  Nausea.  Bloating.  Dizziness.  Fatigue.  Follow these instructions at home:  Do not eat or drink anything until the numbing medicine (local anesthetic) has worn off and your gag reflex has returned. You will know that the local anesthetic has worn off when you can swallow comfortably.  Do not drive for 24 hours if you received a medicine to help you relax (sedative).  If your health care provider took a tissue sample for testing during the procedure, make sure to get your test results. This is your responsibility. Ask your health care provider or the department performing the test when your results will be ready.  Keep all follow-up visits as told by your health care provider. This is important. Contact a health care provider if:  You cannot stop coughing.  You are not urinating.  You are urinating less than usual. Get help right away if:  You have trouble swallowing.  You cannot eat or drink.  You have throat or chest pain that gets worse.  You are  dizzy or light-headed.  You faint.  You have nausea or vomiting.  You have chills.  You have a fever.  You have severe abdominal pain.  You have black, tarry, or bloody stools. This information is not intended to replace advice given to you by your health care provider. Make sure you discuss any questions you have with your health care provider. Document Released: 01/20/2012 Document Revised: 07/11/2015 Document Reviewed: 12/27/2014 Elsevier Interactive Patient Education  2018 [place] Anesthesia is a term that refers to techniques, procedures, and medicines that help a person stay safe and comfortable during a medical procedure. Monitored anesthesia care, or sedation, is one type of anesthesia. Your anesthesia specialist may recommend sedation if you will be having a procedure that does not require you to be unconscious, such as:  Cataract  surgery.  A dental procedure.  A biopsy.  A colonoscopy.  During the procedure, you may receive a medicine to help you relax (sedative). There are three levels of sedation:  Mild sedation. At this level, you may feel awake and relaxed. You will be able to follow directions.  Moderate sedation. At this level, you will be sleepy. You may not remember the procedure.  Deep sedation. At this level, you will be asleep. You will not remember the procedure.  The more medicine you are given, the deeper your level of sedation will be. Depending on how you respond to the procedure, the anesthesia specialist may change your level of sedation or the type of anesthesia to fit your needs. An anesthesia specialist will monitor you closely during the procedure. Let your health care provider know about:  Any allergies you have.  All medicines you are taking, including vitamins, herbs, eye drops, creams, and over-the-counter medicines.  Any use of steroids (by mouth or as a cream).  Any problems you or family members have had with sedatives and anesthetic medicines.  Any blood disorders you have.  Any surgeries you have had.  Any medical conditions you have, such as sleep apnea.  Whether you are pregnant or may be pregnant.  Any use of cigarettes, alcohol, or street drugs. What are the risks? Generally, this is a safe procedure. However, problems may occur, including:  Getting too much medicine (oversedation).  Nausea.  Allergic reaction to medicines.  Trouble breathing. If this happens, a breathing tube may be used to help with breathing. It will be removed when you are awake and breathing on your own.  Heart trouble.  Lung trouble.  Before the procedure Staying hydrated Follow instructions from your health care provider about hydration, which may include:  Up to 2 hours before the procedure - you may continue to drink clear liquids, such as water, clear fruit juice, black  coffee, and plain tea.  Eating and drinking restrictions Follow instructions from your health care provider about eating and drinking, which may include:  8 hours before the procedure - stop eating heavy meals or foods such as meat, fried foods, or fatty foods.  6 hours before the procedure - stop eating light meals or foods, such as toast or cereal.  6 hours before the procedure - stop drinking milk or drinks that contain milk.  2 hours before the procedure - stop drinking clear liquids.  Medicines Ask your health care provider about:  Changing or stopping your regular medicines. This is especially important if you are taking diabetes medicines or blood thinners.  Taking medicines such as aspirin and ibuprofen. These medicines  can thin your blood. Do not take these medicines before your procedure if your health care provider instructs you not to.  Tests and exams  You will have a physical exam.  You may have blood tests done to show: ? How well your kidneys and liver are working. ? How well your blood can clot.  General instructions  Plan to have someone take you home from the hospital or clinic.  If you will be going home right after the procedure, plan to have someone with you for 24 hours.  What happens during the procedure?  Your blood pressure, heart rate, breathing, level of pain and overall condition will be monitored.  An IV tube will be inserted into one of your veins.  Your anesthesia specialist will give you medicines as needed to keep you comfortable during the procedure. This may mean changing the level of sedation.  The procedure will be performed. After the procedure  Your blood pressure, heart rate, breathing rate, and blood oxygen level will be monitored until the medicines you were given have worn off.  Do not drive for 24 hours if you received a sedative.  You may: ? Feel sleepy, clumsy, or nauseous. ? Feel forgetful about what happened after the  procedure. ? Have a sore throat if you had a breathing tube during the procedure. ? Vomit. This information is not intended to replace advice given to you by your health care provider. Make sure you discuss any questions you have with your health care provider. Document Released: 10/29/2004 Document Revised: 07/12/2015 Document Reviewed: 05/26/2015 Elsevier Interactive Patient Education  2018 [place], Care After These instructions provide you with information about caring for yourself after your procedure. Your health care provider may also give you more specific instructions. Your treatment has been planned according to current medical practices, but problems sometimes occur. Call your health care provider if you have any problems or questions after your procedure. What can I expect after the procedure? After your procedure, it is common to:  Feel sleepy for several hours.  Feel clumsy and have poor balance for several hours.  Feel forgetful about what happened after the procedure.  Have poor judgment for several hours.  Feel nauseous or vomit.  Have a sore throat if you had a breathing tube during the procedure.  Follow these instructions at home: For at least 24 hours after the procedure:   Do not: ? Participate in activities in which you could fall or become injured. ? Drive. ? Use heavy machinery. ? Drink alcohol. ? Take sleeping pills or medicines that cause drowsiness. ? Make important decisions or sign legal documents. ? Take care of children on your own.  Rest. Eating and drinking  Follow the diet that is recommended by your health care provider.  If you vomit, drink water, juice, or soup when you can drink without vomiting.  Make sure you have little or no nausea before eating solid foods. General instructions  Have a responsible adult stay with you until you are awake and alert.  Take over-the-counter and prescription  medicines only as told by your health care provider.  If you smoke, do not smoke without supervision.  Keep all follow-up visits as told by your health care provider. This is important. Contact a health care provider if:  You keep feeling nauseous or you keep vomiting.  You feel light-headed.  You develop a rash.  You have a fever. Get help right away if:  You have trouble breathing. This information is not intended to replace advice given to you by your health care provider. Make sure you discuss any questions you have with your health care provider. Document Released: 05/26/2015 Document Revised: 09/25/2015 Document Reviewed: 05/26/2015 Elsevier Interactive Patient Education  [employer].

## 2017-11-17 ENCOUNTER — Encounter (HOSPITAL_COMMUNITY): Payer: Self-pay | Admitting: *Deleted

## 2017-11-17 ENCOUNTER — Inpatient Hospital Stay (HOSPITAL_COMMUNITY): Payer: Medicare Other | Attending: Hematology | Admitting: Hematology

## 2017-11-17 ENCOUNTER — Encounter (HOSPITAL_COMMUNITY): Payer: Self-pay

## 2017-11-17 ENCOUNTER — Other Ambulatory Visit: Payer: Self-pay

## 2017-11-17 ENCOUNTER — Encounter (HOSPITAL_COMMUNITY): Payer: Self-pay | Admitting: Hematology

## 2017-11-17 ENCOUNTER — Encounter (HOSPITAL_COMMUNITY)
Admission: RE | Admit: 2017-11-17 | Discharge: 2017-11-17 | Disposition: A | Discharge status: Home / Self Care | Payer: Medicare Other | Source: Ambulatory Visit | Attending: Gastroenterology | Admitting: Gastroenterology

## 2017-11-17 ENCOUNTER — Inpatient Hospital Stay (HOSPITAL_COMMUNITY): Payer: Medicare Other

## 2017-11-17 VITALS — BP 116/94 | HR 91 | Temp 97.1°F | Resp 18 | Wt 190.3 lb

## 2017-11-17 DIAGNOSIS — R531 Weakness: Secondary | ICD-10-CM | POA: Diagnosis not present

## 2017-11-17 DIAGNOSIS — R1310 Dysphagia, unspecified: Secondary | ICD-10-CM | POA: Insufficient documentation

## 2017-11-17 DIAGNOSIS — R9431 Abnormal electrocardiogram [ECG] [EKG]: Secondary | ICD-10-CM | POA: Diagnosis not present

## 2017-11-17 DIAGNOSIS — C159 Malignant neoplasm of esophagus, unspecified: Secondary | ICD-10-CM | POA: Diagnosis not present

## 2017-11-17 DIAGNOSIS — Z923 Personal history of irradiation: Secondary | ICD-10-CM | POA: Insufficient documentation

## 2017-11-17 DIAGNOSIS — R5383 Other fatigue: Secondary | ICD-10-CM | POA: Insufficient documentation

## 2017-11-17 DIAGNOSIS — R911 Solitary pulmonary nodule: Secondary | ICD-10-CM | POA: Diagnosis not present

## 2017-11-17 DIAGNOSIS — Z79899 Other long term (current) drug therapy: Secondary | ICD-10-CM | POA: Insufficient documentation

## 2017-11-17 DIAGNOSIS — N183 Chronic kidney disease, stage 3 (moderate): Secondary | ICD-10-CM | POA: Diagnosis not present

## 2017-11-17 DIAGNOSIS — Z7189 Other specified counseling: Secondary | ICD-10-CM

## 2017-11-17 DIAGNOSIS — E1165 Type 2 diabetes mellitus with hyperglycemia: Secondary | ICD-10-CM | POA: Diagnosis not present

## 2017-11-17 DIAGNOSIS — C158 Malignant neoplasm of overlapping sites of esophagus: Secondary | ICD-10-CM

## 2017-11-17 DIAGNOSIS — Z87442 Personal history of urinary calculi: Secondary | ICD-10-CM | POA: Diagnosis not present

## 2017-11-17 DIAGNOSIS — Z01818 Encounter for other preprocedural examination: Secondary | ICD-10-CM | POA: Diagnosis not present

## 2017-11-17 DIAGNOSIS — I444 Left anterior fascicular block: Secondary | ICD-10-CM | POA: Diagnosis not present

## 2017-11-17 DIAGNOSIS — G4733 Obstructive sleep apnea (adult) (pediatric): Secondary | ICD-10-CM | POA: Insufficient documentation

## 2017-11-17 DIAGNOSIS — Z7984 Long term (current) use of oral hypoglycemic drugs: Secondary | ICD-10-CM | POA: Diagnosis not present

## 2017-11-17 DIAGNOSIS — M129 Arthropathy, unspecified: Secondary | ICD-10-CM | POA: Diagnosis not present

## 2017-11-17 DIAGNOSIS — I130 Hypertensive heart and chronic kidney disease with heart failure and stage 1 through stage 4 chronic kidney disease, or unspecified chronic kidney disease: Secondary | ICD-10-CM

## 2017-11-17 DIAGNOSIS — D696 Thrombocytopenia, unspecified: Secondary | ICD-10-CM | POA: Diagnosis not present

## 2017-11-17 DIAGNOSIS — Z7982 Long term (current) use of aspirin: Secondary | ICD-10-CM | POA: Diagnosis not present

## 2017-11-17 DIAGNOSIS — Z5111 Encounter for antineoplastic chemotherapy: Secondary | ICD-10-CM | POA: Diagnosis not present

## 2017-11-17 DIAGNOSIS — F250 Schizoaffective disorder, bipolar type: Secondary | ICD-10-CM | POA: Insufficient documentation

## 2017-11-17 DIAGNOSIS — I2510 Atherosclerotic heart disease of native coronary artery without angina pectoris: Secondary | ICD-10-CM | POA: Diagnosis not present

## 2017-11-17 DIAGNOSIS — K219 Gastro-esophageal reflux disease without esophagitis: Secondary | ICD-10-CM | POA: Diagnosis not present

## 2017-11-17 DIAGNOSIS — Z803 Family history of malignant neoplasm of breast: Secondary | ICD-10-CM

## 2017-11-17 DIAGNOSIS — C787 Secondary malignant neoplasm of liver and intrahepatic bile duct: Secondary | ICD-10-CM

## 2017-11-17 DIAGNOSIS — I252 Old myocardial infarction: Secondary | ICD-10-CM

## 2017-11-17 DIAGNOSIS — I5042 Chronic combined systolic (congestive) and diastolic (congestive) heart failure: Secondary | ICD-10-CM | POA: Diagnosis not present

## 2017-11-17 DIAGNOSIS — I700 Atherosclerosis of aorta: Secondary | ICD-10-CM | POA: Insufficient documentation

## 2017-11-17 HISTORY — DX: Malignant (primary) neoplasm, unspecified: C80.1

## 2017-11-17 LAB — BASIC METABOLIC PANEL
Anion gap: 10 (ref 5–15)
BUN: 23 mg/dL (ref 8–23)
CHLORIDE: 100 mmol/L (ref 98–111)
CO2: 26 mmol/L (ref 22–32)
CREATININE: 1.01 mg/dL (ref 0.61–1.24)
Calcium: 9.1 mg/dL (ref 8.9–10.3)
Glucose, Bld: 465 mg/dL — ABNORMAL HIGH (ref 70–99)
Potassium: 4.3 mmol/L (ref 3.5–5.1)
SODIUM: 136 mmol/L (ref 135–145)

## 2017-11-17 NOTE — Assessment & Plan Note (Addendum)
1.  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  He was evaluated by Dr. Servando Snare who did not think he was a candidate for surgery. -Surveillance CT CAP on 10/25/2017 shows disease progression with development of bilateral hepatic metastasis.  Enlargement of a portacaval lymph node also suspicious for metastatic disease. 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 - I have talked about the normal prognosis of metastatic GE junction adenocarcinoma.  We discussed chemotherapy in the palliative setting to control symptoms and improve survival.  He does have some dysphagia to solids.  He has a EGD scheduled next Tuesday.  He is eating about 1 mL day.  However he is drinking Ensure daily.  He does report some weakness. - I have recommended testing his tumor for HER-2, PDL 1 and MSI.  We will send for foundation 1 testing.  If he is MSI high, we can use Pembro in the second line. -I have recommended palliative chemotherapy with FOLFOX-based regimen.  If his HER-2 is positive, will consider adding Herceptin.  He had difficulty tolerating weekly carbotaxol during radiation.  Hence I will start him at a lower dose of FOLFOX during the first cycle and titrated up as he tolerates it well.  We talked about the side effects including neuropathy in detail.  He already has a port in place.  Tentatively we will plan to start him  next Wednesday.

## 2017-11-17 NOTE — Progress Notes (Signed)
START ON PATHWAY REGIMEN - Gastroesophageal     A cycle is every 14 days:     Oxaliplatin      Leucovorin      5-Fluorouracil      5-Fluorouracil   **Always confirm dose/schedule in your pharmacy ordering system**    Patient Characteristics: Distant Metastases (cM1/pM1) / Locally Recurrent Disease, Adenocarcinoma - Esophageal, GE Junction, and Gastric, First Line, HER2 Negative / Unknown Histology: Adenocarcinoma Disease Classification: GE Junction Therapeutic Status: Distant Metastases (No Additional Staging) Line of Therapy: First Line HER2 Status: Awaiting Test Results Intent of Therapy: Non-Curative / Palliative Intent, Discussed with Patient 

## 2017-11-17 NOTE — Progress Notes (Signed)
I spoke with [name] in pathology.  Foundation one and PDL-1 ordered on [id number]. DX C15.9 and stage IV

## 2017-11-17 NOTE — Assessment & Plan Note (Signed)
Goals of care discussion done with the patient.  He will be treated with chemotherapy in the palliative setting for his metastatic disease.

## 2017-11-17 NOTE — Patient Instructions (Addendum)
[place] at [facility] Discharge Instructions   Follow up in 3 weeks with labs.  Thank you for choosing [place] at [facility] to provide your oncology and hematology care.  To afford each patient quality time with our provider, please arrive at least 15 minutes before your scheduled appointment time.   If you have a lab appointment with the [facility] please come in thru the  Main Entrance and check in at the main information desk  You need to re-schedule your appointment should you arrive 10 or more minutes late.  We strive to give you quality time with our providers, and arriving late affects you and other patients whose appointments are after yours.  Also, if you no show three or more times for appointments you may be dismissed from the clinic at the providers discretion.     Again, thank you for choosing [facility].  Our hope is that these requests will decrease the amount of time that you wait before being seen by our physicians.       _____________________________________________________________  Should you have questions after your visit to [facility], please contact our office at [phone] between the hours of 8:00 a.m. and 4:30 p.m.  Voicemails left after 4:00 p.m. will not be returned until the following business day.  For prescription refill requests, have your pharmacy contact our office and allow 72 hours.    Cancer Center Support Programs:   > Cancer Support Group  2nd Tuesday of the month 1pm-2pm, Journey Room

## 2017-11-17 NOTE — Progress Notes (Signed)
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 [name] 67 y.o. 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 Patient is here today with his family. He is having trouble swallowing solid foods. He is having abdominal tenderness with palpation. He has issues with vomiting occasionally. He denies any numbness or tingling. Denies any nausea or diarrhea. His appetite is 50% and he is maintaining his weight well. His energy level is 25%. Patient lives at home alone and performs all his own ADLs. He has friends and family to help assist him with his needs.   REVIEW OF SYSTEMS:  Review of Systems  Constitutional: Positive for fatigue.  HENT:   Positive for mouth sores.   Gastrointestinal: Positive for abdominal pain (with palpation).  Neurological: Positive for extremity weakness.  All other systems reviewed and are negative.    PAST MEDICAL/SURGICAL HISTORY:  Past Medical History:  Diagnosis Date  . Arthritis   . Bipolar disorder ([place])   . CAD (coronary artery disease) 08/16/2016  . Cancer ([place])    esophageal with liver mets  . Cellulitis and abscess of foot 03/05/2016  . Chronic combined systolic and diastolic heart failure ([place])   . Diabetes mellitus without complication ([place])    boderline  . GERD (gastroesophageal reflux disease)   . Heart murmur 1995  . History of kidney stones   . Hypercholesteremia   . Hypertension   . Kidney stones   . Myocardial infarction ([place])   . OSA (obstructive sleep apnea)    no cpap, can't tolerate  . Schizoaffective disorder, bipolar type ([place]) 04/25/2016   Past Surgical History:  Procedure Laterality  Date  . APPENDECTOMY    . BIOPSY  03/09/2017   Procedure: BIOPSY;  Surgeon: Danie Binder, MD;  [facility];  Service: Endoscopy;;  gastric esophageal  . CHOLECYSTECTOMY N/A 10/20/2013   Procedure: LAPAROSCOPIC CHOLECYSTECTOMY;  Surgeon: [name], MD;  Location: [facility];  Service: General;  Laterality: N/A;  . COLONOSCOPY WITH PROPOFOL N/A 03/09/2017   Procedure: COLONOSCOPY WITH PROPOFOL;  Surgeon: Danie Binder, MD;  [facility];  Service: Endoscopy;  Laterality: N/A;  12:15pm  . ESOPHAGOGASTRODUODENOSCOPY (EGD) WITH PROPOFOL N/A 03/09/2017   Procedure: ESOPHAGOGASTRODUODENOSCOPY (EGD) WITH PROPOFOL;  Surgeon: Danie Binder, MD;  [facility];  Service: Endoscopy;  Laterality: N/A;  . EUS N/A 04/08/2017   Procedure: UPPER ENDOSCOPIC ULTRASOUND (EUS) RADIAL;  Surgeon: Milus Banister, MD;  Location: [facility];  Service: Endoscopy;  Laterality: N/A;  . HIP SURGERY Left   . KNEE SURGERY Left   . POLYPECTOMY  03/09/2017   Procedure: POLYPECTOMY;  Surgeon: Danie Binder, MD;  [facility];  Service: Endoscopy;;  colon   . PORTACATH PLACEMENT Left 04/07/2017   Procedure: INSERTION PORT-A-CATH LEFT SUBCLAVIAN;  Surgeon: Virl Cagey, MD;  Location: [facility];  Service: General;  Laterality: Left;  . SAVORY DILATION N/A 03/09/2017   Procedure: SAVORY DILATION;  Surgeon: Danie Binder, MD;  [facility];  Service: Endoscopy;  Laterality: N/A;     SOCIAL HISTORY:  Social History   Socioeconomic History  . Marital status: Divorced  Spouse name: Not on file  . Number of children: Not on file  . Years of education: Not on file  . Highest education level: Not on file  Occupational History  . Not on file  Social Needs  . Financial resource strain: Not on file  . Food insecurity:    Worry: Not on file    Inability: Not on file  . Transportation needs:    Medical: Not on file    Non-medical: Not on file  Tobacco Use    . Smoking status: Never Smoker  . Smokeless tobacco: Never Used  Substance and Sexual Activity  . Alcohol use: No  . Drug use: No  . Sexual activity: Not Currently    Birth control/protection: Implant  Lifestyle  . Physical activity:    Days per week: Not on file    Minutes per session: Not on file  . Stress: Not on file  Relationships  . Social connections:    Talks on phone: Not on file    Gets together: Not on file    Attends religious service: Not on file    Active member of club or organization: Not on file    Attends meetings of clubs or organizations: Not on file    Relationship status: Not on file  . Intimate partner violence:    Fear of current or ex partner: Not on file    Emotionally abused: Not on file    Physically abused: Not on file    Forced sexual activity: Not on file  Other Topics Concern  . Not on file  Social History Narrative  . Not on file    FAMILY HISTORY:  Family History  Problem Relation Age of Onset  . Hypertension Mother   . Dementia Mother   . Cancer Mother        breast  . Colon cancer Neg Hx   . Colon polyps Neg Hx     CURRENT MEDICATIONS:  Outpatient Encounter Medications as of 11/17/2017  Medication Sig  . acetaminophen (TYLENOL) 325 MG tablet Take 650 mg by mouth every 6 (six) hours as needed for moderate pain.   Marland Kitchen AMITIZA 24 MCG capsule Take 24 mcg by mouth 2 (two) times a week.   Marland Kitchen aspirin EC 81 MG tablet Take 81 mg by mouth daily.  . Blood Glucose Monitoring Suppl (ONETOUCH VERIO) w/Device KIT 1 each by Does not apply route as needed.  . carvedilol (COREG) 12.5 MG tablet TAKE 1 TABLET(12.5 MG) BY MOUTH TWICE DAILY (Patient taking differently: Take 12.5 mg by mouth 2 (two) times daily with a meal. )  . ENTRESTO 49-51 MG TAKE 1 TABLET BY MOUTH TWICE DAILY (Patient taking differently: Take 1 tablet by mouth 2 (two) times daily. )  . furosemide (LASIX) 40 MG tablet Alternate 40 mg one day and 20 mg the next (Patient taking  differently: Take 20-40 mg by mouth See admin instructions. Take 74m by mouth one day and alternate with 258mthe next day)  . glipiZIDE (GLUCOTROL XL) 5 MG 24 hr tablet Take 1 tablet (5 mg total) by mouth daily with breakfast.  . glucose blood test strip 1 each by Other route 4 (four) times daily. Use as instructed qid One Touch ultra  . linagliptin (TRADJENTA) 5 MG TABS tablet Take 5 mg by mouth daily.  . metFORMIN (GLUCOPHAGE) 500 MG tablet TAKE 1 TABLET(500 MG) BY MOUTH TWICE DAILY WITH A MEAL (Patient taking differently: Take 500 mg by mouth 2 (  two) times daily with a meal. )  . milk thistle 175 MG tablet Take 175 mg by mouth daily.  Marland Kitchen OLANZapine (ZYPREXA) 5 MG tablet Take 5 mg by mouth 2 (two) times daily.  . ondansetron (ZOFRAN ODT) 8 MG disintegrating tablet Take 1 tablet (8 mg total) by mouth every 8 (eight) hours as needed for nausea or vomiting.  . pantoprazole (PROTONIX) 40 MG tablet Take 1 tablet (40 mg total) by mouth 2 (two) times daily before a meal.  . potassium chloride SA (K-DUR,KLOR-CON) 20 MEQ tablet Take 20 mEq by mouth daily.  . rosuvastatin (CRESTOR) 10 MG tablet Take 1 tablet (10 mg total) by mouth daily.   No facility-administered encounter medications on file as of 11/17/2017.     ALLERGIES:  Allergies  Allergen Reactions  . Codeine Nausea And Vomiting  . Morphine And Related Nausea And Vomiting     PHYSICAL EXAM:  ECOG Performance status: 1  Vitals:   11/17/17 0822  BP: (!) 116/94  Pulse: 91  Resp: 18  Temp: (!) 97.1 F (36.2 C)  SpO2: 97%   Filed Weights   11/17/17 0822  Weight: 190 lb 4.8 oz (86.3 kg)    Physical Exam  Constitutional: He is oriented to person, place, and time. He appears well-developed and well-nourished.  Cardiovascular: Normal rate, regular rhythm and normal heart sounds.  Pulmonary/Chest: Effort normal and breath sounds normal.  Abdominal: There is tenderness.  Musculoskeletal: Normal range of motion.  Neurological: He is  alert and oriented to person, place, and time.  Skin: Skin is warm and dry.  Psychiatric: He has a normal mood and affect. His behavior is normal. Judgment and thought content normal.     LABORATORY DATA:  I have reviewed the labs as listed.  CBC    Component Value Date/Time   WBC 7.7 11/09/2017 1137   RBC 5.49 11/09/2017 1137   HGB 16.5 11/09/2017 1137   HCT 47.2 11/09/2017 1137   PLT 116 (L) 11/09/2017 1137   MCV 86.0 11/09/2017 1137   MCH 30.1 11/09/2017 1137   MCHC 35.0 11/09/2017 1137   RDW 14.7 11/09/2017 1137   LYMPHSABS 0.8 10/25/2017 0922   MONOABS 0.5 10/25/2017 0922   EOSABS 0.2 10/25/2017 0922   BASOSABS 0.0 10/25/2017 0922   CMP Latest Ref Rng & Units 11/17/2017 10/25/2017 10/08/2017  Glucose 70 - 99 mg/dL 465(H) 441(H) 291(H)  BUN 8 - 23 mg/dL 23 13 13   Creatinine 0.61 - 1.24 mg/dL 1.01 1.03 1.00  Sodium 135 - 145 mmol/L 136 134(L) 138  Potassium 3.5 - 5.1 mmol/L 4.3 4.2 4.0  Chloride 98 - 111 mmol/L 100 99 100  CO2 22 - 32 mmol/L 26 26 28   Calcium 8.9 - 10.3 mg/dL 9.1 8.9 9.1  Total Protein 6.5 - 8.1 g/dL - 6.8 6.3  Total Bilirubin 0.3 - 1.2 mg/dL - 0.7 0.5  Alkaline Phos 38 - 126 U/L - 188(H) -  AST 15 - 41 U/L - 28 21  ALT 0 - 44 U/L - 26 20       DIAGNOSTIC IMAGING:  I have independently reviewed images of the CT CAP from September 2019.     ASSESSMENT & PLAN:   Goals of care, counseling/discussion Goals of care discussion done with the patient.  He will be treated with chemotherapy in the palliative setting for his metastatic disease.  Malignant neoplasm of esophagus ([place]) 1.  Metastatic GE junction adenocarcinoma: -7 cm long, bulky, focally circumferential, with nearby  satellite lesion 2 cm distal to the GE junction, UT3N0 -Radiation therapy started on 04/19/2017 through 05/26/2017, week 1 of carboplatin and paclitaxel on 04/21/2017, week 2 on 04/28/2017 - Chemotherapy held for the last 2 weeks secondary to thrombocytopenia -Posttreatment  completion CT scan of the chest, abdomen and pelvis dated 07/19/2017 showed nonspecific persistent moderate irregular circumferential wall thickening in the lower thoracic esophagus, decreased from prior scans.  He was evaluated by Dr. Servando Snare who did not think he was a candidate for surgery. -Surveillance CT CAP on 10/25/2017 shows disease progression with development of bilateral hepatic metastasis.  Enlargement of a portacaval lymph node also suspicious for metastatic disease. 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 - I have talked about the normal prognosis of metastatic GE junction adenocarcinoma.  We discussed chemotherapy in the palliative setting to control symptoms and improve survival.  He does have some dysphagia to solids.  He has a EGD scheduled next Tuesday.  He is eating about 1 mL day.  However he is drinking Ensure daily.  He does report some weakness. - I have recommended testing his tumor for HER-2, PDL 1 and MSI.  We will send for foundation 1 testing.  If he is MSI high, we can use Pembro in the second line. -I have recommended palliative chemotherapy with FOLFOX-based regimen.  If his HER-2 is positive, will consider adding Herceptin.  He had difficulty tolerating weekly carbotaxol during radiation.  Hence I will start him at a lower dose of FOLFOX during the first cycle and titrated up as he tolerates it well.  We talked about the side effects including neuropathy in detail.  He already has a port in place.  Tentatively we will plan to start him next Wednesday.    Total time spent is 40 minutes with more than 50% of the time spent face-to-face discussing scan results, biopsy results, prognosis, treatment options and coordination of care.    Orders placed this encounter:  Orders Placed This Encounter  Procedures  . CBC with Differential/Platelet  . Comprehensive metabolic panel  . CBC with  Differential/Platelet  . Comprehensive metabolic panel      Derek Jack, MD [place] [phone]

## 2017-11-17 NOTE — Patient Instructions (Signed)
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  We are going to treat you with palliative intent, which means your cancer is not curable, but it is treatable.  We are going to be giving you chemotherapy through your port a cath. We will be giving you the regimen called Folfox.  It is Oxaliplatin (Eloxatin), Leucovorin Calcium, and 5FU or Fluorouracil.  You will see the doctor regularly throughout treatment.  We monitor your lab work prior to every treatment. The doctor monitors your response to treatment by the way you are feeling, your blood work, and scans periodically.  There will be wait times while you are here for treatment.  It will take about 30 minutes to 1 hour for your lab work to result.  Then there will be wait times while pharmacy mixes your medications.   You will have pre-medications that you receive prior to each chemotherapy: Premeds: Aloxi - high powered nausea/vomiting prevention medication used for chemotherapy patients.  Dexamethasone - steroid - given to reduce the risk of you having an allergic type reaction to the chemotherapy. Dexamethasone can cause you to feel energized, nervous/anxious/jittery, make you have trouble sleeping, and/or make you feel hot/flushed in the face/neck and/or look pink/red in the face/neck. These side effects will pass as the medication wears off.   POTENTIAL SIDE EFFECTS OF TREATMENT: 5-Fluorouracil (Adrucil)  About This Drug Fluorouracil is used to treat cancer. It is given in the vein (IV).  This is the drug that will go home with you in your ambulatory pump.   Possible Side Effects . Hair loss. Hair loss is often temporary, although with certain medicine, hair loss can sometimes be permanent. Hair loss may happen suddenly or gradually. If you lose hair, you may lose it from your head, face, armpits, pubic area, chest, and/or legs. You may also notice your  hair getting thin. . Changes in your nail color, nail loss and/or brittle nail . Darkening of the skin, or changes to the color of your skin and/or veins used for infusion . Rash, itching . Nausea and throwing up (vomiting) . Loose bowel movements (diarrhea) . Ulcers - sores that may cause pain or bleeding in your digestive tract, which includes your mouth, esophagus, stomach, small/large intestines and rectum . Soreness of the mouth and throat. You may have red areas, white patches, or sores that hurt. . Decreased appetite (decreased hunger) . Changes in the tissue of the heart and/or heart attack. Some changes may happen that can cause your heart to have less ability to pump blood. . Bone marrow depression. This is a decrease in the number of white blood cells, red blood cells, and platelets. This may raise your risk of infection, make you tired and weak (fatigue), and raise your risk of bleeding . Sensitivity to light (photosensitivity). Photosensitivity means that you may become more sensitive to the sun and/or light. Your eyes may water more, mostly in bright light. . Allergic reaction: Allergic reactions, including anaphylaxis are rare but may happen in some patients. Signs of allergic reaction to this drug may be swelling of the face, feeling like your tongue or throat are swelling, trouble breathing, rash, itching, fever, chills, feeling dizzy, and/or feeling that your heart is beating in a fast or not normal way. If this happens, do not take another dose of this drug. You should get urgent medical treatment. . Blurred vision or other changes in eyesight  Note: Not all possible side effects are included above.  Warnings and Precautions . Hand-and-foot syndrome. The palms of your hands or soles of your feet may tingle, become numb, painful, swollen, or red. . Changes in your central nervous system can happen. The central nervous system is made up of your brain and spinal cord. You could feel  extreme tiredness, agitation, confusion, hallucinations (see or hear things that are not there), trouble understanding or speaking, loss of control of your bowels or bladder, eyesight changes, numbness or lack of strength to your arms, legs, face, or body, and coma. If you start to have any of these symptoms let your doctor know right away. . Side effects of this drug may be unexpectedly severe in some patients Note: Some of the side effects above are very rare. If you have concerns and/or questions, please discuss them with your medical team.  Important Information . This drug may be present in the saliva, tears, sweat, urine, stool, vomit, semen, and vaginal secretions. Talk to your doctor and/or your nurse about the necessary precautions to take during this time.  Treating Side Effects . To help with hair loss, wash with a mild shampoo and avoid washing your hair every day. . Avoid rubbing your scalp, pat your hair or scalp dry. . Avoid coloring your hair. . Limit your use of hair spray, electric curlers, blow dryers, and curling irons. . If you are interested in getting a wig, talk to your nurse. You can also call the [facility] at [phone] to find out information about the "Look Good, Feel Better" program close to where you live. It is a free program where women getting chemotherapy can learn about wigs, turbans and scarves as well as makeup techniques and skin and nail care. Marland Kitchen Keeping your nails moisturized may help with brittleness. . To help with itching, moisturize your skin several times day. . Avoid sun exposure and apply sunscreen routinely when outdoors. . If you get a rash do not put anything on it unless your doctor or nurse says you may. Keep the area around the rash clean and dry. Ask your doctor for medicine if your rash bothers you. . To help with decreased appetite, eat small, frequent meals. . Eat high caloric food such as pudding, ice cream, yogurt and  milkshakes. . Drink plenty of fluids (a minimum of eight glasses per day is recommended). . If you throw up or have loose bowel movements, you should drink more fluids so that you do not become dehydrated (lack water in the body from losing too much fluid). . To help with nausea and vomiting, eat small, frequent meals instead of three large meals a day. Choose foods and drinks that are at room temperature. Ask your nurse or doctor about other helpful tips and medicine that is available to help or stop lessen these symptoms. . If you get diarrhea, eat low-fiber foods that are high in protein and calories and avoid foods that can irritate your digestive tracts or lead to cramping. . Ask your nurse or doctor about medicine that can lessen or stop your diarrhea. . Mouth care is very important. Your mouth care should consist of routine, gentle cleaning of your teeth or dentures and rinsing your mouth with a mixture of 1/2 teaspoon of salt in 8 ounces of water or  teaspoon of baking soda in 8 ounces of water. This should be done at least after each meal and at bedtime. . If you have  mouth sores, avoid mouthwash that has alcohol. Also avoid alcohol and smoking because they can bother your mouth and throat. . Manage tiredness by pacing your activities for the day. . Be sure to include periods of rest between energy-draining activities. . To help decrease your risk of infections, wash your hands regularly. . Avoid close contact with people who have a cold, the flu, or other infections. . Use a soft toothbrush. Check with your nurse before using dental floss. . Be very careful when using knives or tools. . Use an electric shaver instead of a razor.  Food and Drug Interactions . There are no known interactions of fluorouracil with food. . Check with your doctor or pharmacist about all other prescription medicines and dietary supplements you are taking before starting this medicine as there are a lot of  known drug interactions with fluorouracil. Also, check with your doctor or pharmacist before starting any new prescription or over-the-counter medicines, or dietary supplement to make sure that there are no interactions.  When to Call the Doctor Call your doctor or nurse if you have any of these symptoms and/or any new or unusual symptoms: . Fever of 100.5 F (38 C) or higher . Chills . Easy bleeding or bruising . Trouble breathing . Feeling dizzy or lightheaded . Feeling that your heart is beating in a fast or not normal way (palpitations) . Chest pain or symptoms of a heart attack. Most heart attacks involve pain in the center of the chest that lasts more than a few minutes. The pain may go away and come back or it can be constant. It can feel like pressure, squeezing, fullness, or pain. Sometimes pain is felt in one or both arms, the back, neck, jaw, or stomach. If any of these symptoms last 2 minutes, call 911. Marland Kitchen Confusion and/or agitation . Hallucinations . Trouble understanding or speaking . Blurry vision or changes in your eyesight . Numbness or lack of strength to your arms, legs, face, or body . Nausea that stops you from eating or drinking and/or is not relieved by prescribed medicines . Throwing up more than 3 times a day . Loose bowel movements (diarrhea) 4 times a day or loose bowel movements with lack of strength or a feeling of being dizzy . Lasting loss of appetite or rapid weight loss of five pounds in a week . Pain in your mouth or throat that makes it hard to eat or drink . Pain along the digestive tract - especially if worse after eating . Blood in your vomit (bright red or coffee-ground) and/or stools (bright red, or black/tarry) . Coughing up blood . Fatigue that interferes with your daily activities . Painful, red, or swollen areas on your hands or feet . Numbness and/or tingling of your hands and/or feet . Signs of allergic reaction: swelling of the face, feeling  like your tongue or throat are swelling, trouble breathing, rash, itching, fever, chills, feeling dizzy, and/or feeling that your heart is beating in a fast or not normal way . If you think you are pregnant or may have impregnated your partner  Reproduction Warnings . Pregnancy warning: This drug may have harmful effects on the unborn baby. Women of child bearing potential should use effective methods of birth control during your cancer treatment. Let your doctor know right away if you think you may be pregnant. . Breastfeeding warning: It is not known if this drug passes into breast milk. For this reason, women should talk to their  doctor about the risks and benefits of breast feeding during treatment with this drug because this drug may enter the breast milk and cause harm to a breast feeding baby. . Fertility warning: In men and women both, this drug may affect your ability to have children in the future. Talk with your doctor or nurse if you plan to have children. Ask for information on sperm or egg banking.  Leucovorin Calcium (Generic Name) Other Names: Leucovorin, Wellcovorin, Folinic Acid, Citrovorum Factor  About This Drug Leucovorin is a vitamin. It is used in combination with other cancer fighting drugs such as 5-fluorouracil and methotrexate. Leucovorin helps 5-fluorouracil kill the cancer cells. It also helps to reduce the side effects of methotrexate. Leucovorin is given in the vein (IV) or by mouth (orally).  Will take 2 hours to infuse.  Possible Side Effects . Rash . Wheezing Leucovorin by itself has very few side effects. Side effects you may have can be caused by the other drugs you are taking, such as 5-fluorouracil or methotrexate.  Allergic Reactions . Allergic reactions to this drug are rare. While you are getting this drug by in your vein (IV), please tell your nurse right away if you have any of these symptoms of an allergic reaction: . Trouble catching your breath .  Feeling like your tongue or throat are swelling . Feeling your heart beat quickly or in a not normal way (palpitations) . Feeling dizzy or lightheaded . Flushing, itching, rash, and/or hives . If you are taking the oral form of leucovorin and you have these symptoms, do not take another dose of this drug and get urgent medical treatment.  Treating Side Effects If you get a rash do not put anything on it unless your doctor or nurse says you may. Keep the area around the rash clean and dry. Ask your doctor for medicine if your rash bothers you.  Food and Drug Interactions There are no known interactions of leucovorin with food. This drug may interact with other medicines. Tell your doctor and pharmacist about all the medicines and dietary supplements (vitamins, minerals, herbs and others) that you are taking at this time. The safety and use of dietary supplements and alternative diets are often not known. Using these might affect your cancer or interfere with your treatment. Until more is known, you should not use dietary supplements or alternative diets without your cancer doctor's help.  When to Call the Doctor . Call your doctor or nurse right away if you have any of these symptoms: . Wheezing or trouble breathing . Rash with or without itching, redness, or hives . If you take leucovorin by mouth, please also call your doctor right away if you have: Marland Kitchen Throwing up (vomiting) . Loose bowel movements (diarrhea)  Reproduction Concerns . Pregnancy warning: It is not known if this drug may harm an unborn child. For this reason, be sure to talk with your doctor if you are pregnant or planning to become pregnant while getting this drug. . Genetic counseling is available for you to talk about the effects of this drug therapy on future pregnancies. Also, a genetic counselor can look at the possible risk of problems in the unborn baby due to this medicine if an exposure happens during pregnancy. .  Breast feeding warning: It is not known if this drug passes into breast milk. For this reason, women should talk to their doctor about the risks and benefits of breast feeding during treatment with this drug because this  drug may enter the breast milk and badly harm a breast feeding baby.   Oxaliplatin (Generic Name) Other Names: EloxatinTM  About This Drug Oxaliplatin is used to treat cancer. It is given in the vein (IV).  Will take 2 hours to infuse and will infuse with the leucovorin.       Possible Side Effects (More Common) . Effects on the nerves are called peripheral neuropathy. You may feel numbness, tingling, or pain in your hands and feet. Cold temperatures can make it worse. Avoid cold drinks with ice. Dress warmly and cover the skin if you go out in the cold. Wear socks or gloves if you have to touch cold objects like cold flooring or items in the refrigerator or freezer. . It may be hard for you to button your clothes, open jars, or walk as usual. The effect on the nerves may get worse with more doses of the drug. These effects get better in some people after the drug is stopped but it does not get better in all people . Bone marrow depression: This is a decrease in the number of white blood cells, red blood cells, and platelets. It may increase your risk for infection, make you tired and weak (fatigue), and raise your risk of bleeding. . Nausea and throwing up (vomiting). These symptoms may happen within a few hours after your treatment and may last up to 72 hours. Medicines are available to stop or lessen these side effects. . Electrolyte changes. Your blood will be checked for electrolyte changes as needed.  Possible Side Effects (Less Common) . Skin and tissue irritation: This may involve redness, pain, warmth, or swelling at the IV site. This happens if the drug leaks out of the vein and into nearby tissue. . Serious allergic reactions including anaphylaxis are rare. While you  are getting this drug in your vein (IV), tell your nurse right away if you have any of these symptoms of an allergic reaction: . Trouble catching your breath. . Feeling like your tongue or throat are swelling. . Feeling your heart beat quickly or in a not normal way (palpitations). . Feeling dizzy or lightheaded. . Flushing, itching, rash, or hives. . Changes in your liver function. Your doctor will check your liver function as needed.  Treating Side Effects . Do not drink cold drinks or use ice cubes in drinks. Drink fluids at room temperature or warmer. Drink through a straw. . Wear gloves to touch cold objects. Be aware that most metals are cold to the touch, especially in winter. Examples are your car door handle and your mail box latch. . Wear warm clothing in cold weather at all times. Cover your mouth and nose with a scarf or a pulldown cap (ski cap) to warm the air that goes to your lungs. . Ask your doctor or nurse about medicine to treat nausea, throwing up, headache, or loose bowel movements. . While you are getting this drug in your vein, tell your nurse right away if you have any pain, redness, or swelling at the site of the IV infusion. . Mouth care is very important. Your mouth care should consist of routine, gentle cleaning of your teeth or dentures and rinsing your mouth with a mixture of 1/2 teaspoon of salt in 8 ounces of water or  teaspoon of baking soda in 8 ounces of water. This should be done at least after each meal and at bedtime. . Drink 6-8 cups of fluids each day unless your  doctor has told you to limit your fluid intake due to some other health problem. A cup is 8 ounces of fluid. If you throw up or have loose bowel movements you should drink more fluids so that you do not become dehydrated (lack water in the body due to losing too much fluid).  Food and Drug Interactions There are no known interactions of oxaliplatin with food. This drug may interact with other  medicines. Tell your doctor and pharmacist about all the medicines and dietary supplements (vitamins, minerals, herbs and others) that you are taking at this time. The safety and use of dietary supplements and alternative diets are often not known. Using these might affect your cancer or interfere with your treatment. Until more is known, you should not use dietary supplements or alternative diets without your cancer doctor's help.  When to Call the Doctor Call your doctor or nurse right away if you have any of these symptoms: . Fever of 100.5 F (38 C) or above . Chills . Easy bruising or bleeding . Wheezing or trouble breathing . Rash or itching . Feeling dizzy or lightheaded . Feeling that your heart is beating in a fast or not normal way (palpitations) . Loose bowel movements (diarrhea) more than 4 times a day or diarrhea with weakness or feeling lightheaded . Blurred vision or other changes in eyesight . Pain when passing urine; blood in urine . Pain in your lower back or side . Feeling confused or agitated . Nausea that stops you from eating or drinking . Throwing up more than 3 times a day . Chest pain or symptoms of a heart attack. Most heart attacks involve pain in the center of the chest that lasts more than a few minutes. The pain may go away and come back. It can feel like pressure, squeezing, fullness, or pain. Sometimes pain is felt in one or both arms, the back, neck, jaw, or stomach. If any of these symptoms last 2 minutes, call 911. Marland Kitchen Symptoms of a stroke such as sudden numbness or weakness of your face, arm, or leg, mostly on one side of your body; sudden confusion, trouble speaking or understanding; sudden trouble seeing in one or both eyes; sudden trouble walking, feeling dizzy, loss of balance or coordination; or sudden, bad headache with no known cause. If you have any of these symptoms for 2 minutes, call 911. . Signs of liver problems: dark urine, pale bowel movements, bad  stomach pain, feeling very tired and weak, itching, or yellowing of the eyes or skin. Call your doctor or nurse as soon as possible if any of these symptoms happen: . Change in hearing, ringing in the ears . Decreased urine . Unusual thirst or passing urine often . Pain in your mouth or throat that makes it hard to eat or drink . Nausea that is not relieved by prescribed medicines . Rash that is not relieved by prescribed medicines . Heavy menstrual period that lasts longer than normal . Numbness, tingling, decreased feeling or weakness in fingers, toes, arms, or legs . Trouble walking or changes in the way you walk, feeling clumsy when buttoning clothes, opening jars, or other routine hand motions . Swelling of legs, ankles, or feet . Weight gain of 5 pounds in one week (fluid retention) . Lasting loss of appetite or rapid weight loss of five pounds in a week . Fatigue that interferes with your daily activities . Headache that does not go away . Painful, red, or swollen  areas on your hands or feet. . No bowel movement for 3 days or you feel uncomfortable . Extreme weakness that interferes with normal activities  Reproduction Concerns Infertility Warning: Sexual problems and reproduction concerns may happen. In both men and women, this drug may affect your ability to have children. This cannot be determined before your treatment. Talk with your doctor or nurse if you plan to have children. Ask for information on sperm or egg banking. In men, this drug may interfere with your ability to make sperm, but it should not change your ability to have sexual relations. In women, menstrual bleeding may become irregular or stop while you are getting this drug. Do not assume that you cannot become pregnant if you do not have a menstrual period. Women may go through signs of menopause (change of life) like vaginal dryness or itching. Vaginal lubricants can be used to lessen vaginal dryness, itching, and pain  during sexual relations. Genetic counseling is available for you to talk about the effects of this drug therapy on future pregnancies. Also, a genetic counselor can look at the possible risk of problems in the unborn baby due to this medicine if an exposure happens during pregnancy. Pregnancy warning: This drug may have harmful effects on the unborn child, so effective methods of birth control should be used during your cancer treatment. Breast feeding warning: It is not known if this drug passes into breast milk. For this reason, women should talk to their doctor about the risks and benefits of breast feeding during treatment with this drug because this drug may enter the breast milk and badly harm a breast feeding baby.   SELF CARE ACTIVITIES WHILE ON CHEMOTHERAPY:  Hydration Increase your fluid intake 48 hours prior to treatment and drink at least 8 to 12 cups (64 ounces) of water/decaffeinated beverages per day after treatment. You can still have your cup of coffee or soda but these beverages do not count as part of your 8 to 12 cups that you need to drink daily. No alcohol intake.  Medications Continue taking your normal prescription medication as prescribed.  If you start any new herbal or new supplements please let us know first to make sure it is safe.  Mouth Care Have teeth cleaned professionally before starting treatment. Keep dentures and partial plates clean. Use soft toothbrush and do not use mouthwashes that contain alcohol. Biotene is a good mouthwash that is available at most pharmacies or may be ordered by calling [phone]. Use warm salt water gargles (1 teaspoon salt per 1 quart warm water) before and after meals and at bedtime. Or you may rinse with 2 tablespoons of three-percent hydrogen peroxide mixed in eight ounces of water. If you are still having problems with your mouth or sores in your mouth please call the clinic. If you need dental work, please let the doctor know  before you go for your appointment so that we can coordinate the best possible time for you in regards to your chemo regimen. You need to also let your dentist know that you are actively taking chemo. We may need to do labs prior to your dental appointment.  Skin Care Always use sunscreen that has not expired and with SPF (Sun Protection Factor) of 50 or higher. Wear hats to protect your head from the sun. Remember to use sunscreen on your hands, ears, face, & feet.  Use good moisturizing lotions such as udder cream, eucerin, or even Vaseline. Some chemotherapies can cause  dry skin, color changes in your skin and nails.    . Avoid long, hot showers or baths. . Use gentle, fragrance-free soaps and laundry detergent. . Use moisturizers, preferably creams or ointments rather than lotions because the thicker consistency is better at preventing skin dehydration. Apply the cream or ointment within 15 minutes of showering. Reapply moisturizer at night, and moisturize your hands every time after you wash them.  Hair Loss (if your doctor says your hair will fall out)  . If your doctor says that your hair is likely to fall out, decide before you begin chemo whether you want to wear a wig. You may want to shop before treatment to match your hair color. . Hats, turbans, and scarves can also camouflage hair loss, although some people prefer to leave their heads uncovered. If you go bare-headed outdoors, be sure to use sunscreen on your scalp. . Cut your hair short. It eases the inconvenience of shedding lots of hair, but it also can reduce the emotional impact of watching your hair fall out. . Don't perm or color your hair during chemotherapy. Those chemical treatments are already damaging to hair and can enhance hair loss. Once your chemo treatments are done and your hair has grown back, it's OK to resume dyeing or perming hair. With chemotherapy, hair loss is almost always temporary. But when it grows back, it  may be a different color or texture. In older adults who still had hair color before chemotherapy, the new growth may be completely gray.  Often, new hair is very fine and soft.  Infection Prevention Please wash your hands for at least 30 seconds using warm soapy water. Handwashing is the #1 way to prevent the spread of germs. Stay away from sick people or people who are getting over a cold. If you develop respiratory systems such as green/yellow mucus production or productive cough or persistent cough let us know and we will see if you need an antibiotic. It is a good idea to keep a pair of gloves on when going into grocery stores/[employer] to decrease your risk of coming into contact with germs on the carts, etc. Carry alcohol hand gel with you at all times and use it frequently if out in public. If your temperature reaches 100.5 or higher please call the clinic and let us know.  If it is after hours or on the weekend please go to the ER if your temperature is over 100.5.  Please have your own personal thermometer at home to use.    Sex and bodily fluids If you are going to have sex, a condom must be used to protect the person that isn't taking chemotherapy. Chemo can decrease your libido (sex drive). For a few days after chemotherapy, chemotherapy can be excreted through your bodily fluids.  When using the toilet please close the lid and flush the toilet twice.  Do this for a few day after you have had chemotherapy.   Effects of chemotherapy on your sex life Some changes are simple and won't last long. They won't affect your sex life permanently. Sometimes you may feel: . too tired . not strong enough to be very active . sick or sore  . not in the mood . anxious or low Your anxiety might not seem related to sex. For example, you may be worried about the cancer and how your treatment is going. Or you may be worried about money, or about how you family are coping with your  illness. These things can  cause stress, which can affect your interest in sex. It's important to talk to your partner about how you feel. Remember - the changes to your sex life don't usually last long. There's usually no medical reason to stop having sex during chemo. The drugs won't have any long term physical effects on your performance or enjoyment of sex. Cancer can't be passed on to your partner during sex  Contraception It's important to use reliable contraception during treatment. Avoid getting pregnant while you or your partner are having chemotherapy. This is because the drugs may harm the baby. Sometimes chemotherapy drugs can leave a man or woman infertile.  This means you would not be able to have children in the future. You might want to talk to someone about permanent infertility. It can be very difficult to learn that you may no longer be able to have children. Some people find counselling helpful. There might be ways to preserve your fertility, although this is easier for men than for women. You may want to speak to a fertility expert. You can talk about sperm banking or harvesting your eggs. You can also ask about other fertility options, such as donor eggs. If you have or have had breast cancer, your doctor might advise you not to take the contraceptive pill. This is because the hormones in it might affect the cancer.  It is not known for sure whether or not chemotherapy drugs can be passed on through semen or secretions from the vagina. Because of this some doctors advise people to use a barrier method if you have sex during treatment. This applies to vaginal, anal or oral sex. Generally, doctors advise a barrier method only for the time you are actually having the treatment and for about a week after your treatment. Advice like this can be worrying, but this does not mean that you have to avoid being intimate with your partner. You can still have close contact with your partner and continue to enjoy  sex.  Animals If you have cats or birds we just ask that you not change the litter or change the cage.  Please have someone else do this for you while you are on chemotherapy.   Food Safety During and After Cancer Treatment Food safety is important for people both during and after cancer treatment. Cancer and cancer treatments, such as chemotherapy, radiation therapy, and stem cell/bone marrow transplantation, often weaken the immune system. This makes it harder for your body to protect itself from foodborne illness, also called food poisoning. Foodborne illness is caused by eating food that contains harmful bacteria, parasites, or viruses.  Foods to avoid Some foods have a higher risk of becoming tainted with bacteria. These include: Mar[place] Kitchen Unwashed fresh fruit and vegetables, especially leafy vegetables that can hide dirt and other contaminants . Raw sprouts, such as alfalfa sprouts . Raw or undercooked beef, especially ground beef, or other raw or undercooked meat and poultry . Fatty, fried, or spicy foods immediately before or after treatment.  These can sit heavy on your stomach and make you feel nauseous. . Raw or undercooked shellfish, such as oysters. . Sushi and sashimi, which often contain raw fish.  . Unpasteurized beverages, such as unpasteurized fruit juices, raw milk, raw yogurt, or cider . Undercooked eggs, such as soft boiled, over easy, and poached; raw, unpasteurized eggs; or foods made with raw egg, such as homemade raw cookie dough and homemade mayonnaise Simple steps for food safety Shop  smart. . Do not buy food stored or displayed in an unclean area. . Do not buy bruised or damaged fruits or vegetables. . Do not buy cans that have cracks, dents, or bulges. . Pick up foods that can spoil at the end of your shopping trip and store them in a cooler on the way home. Prepare and clean up foods carefully. . Rinse all fresh fruits and vegetables under running water, and dry them  with a clean towel or paper towel. . Clean the top of cans before opening them. . After preparing food, wash your hands for 20 seconds with hot water and soap. Pay special attention to areas between fingers and under nails. . Clean your utensils and dishes with hot water and soap. Marland Kitchen Disinfect your kitchen and cutting boards using 1 teaspoon of liquid, unscented bleach mixed into 1 quart of water.   Dispose of old food. . Eat canned and packaged food before its expiration date (the "use by" or "best before" date). . Consume refrigerated leftovers within 3 to 4 days. After that time, throw out the food. Even if the food does not smell or look spoiled, it still may be unsafe. Some bacteria, such as Listeria, can grow even on foods stored in the refrigerator if they are kept for too long. Take precautions when eating out. . At restaurants, avoid buffets and salad bars where food sits out for a long time and comes in contact with many people. Food can become contaminated when someone with a virus, often a norovirus, or another "bug" handles it. . Put any leftover food in a "to-go" container yourself, rather than having the server do it. And, refrigerate leftovers as soon as you get home. . Choose restaurants that are clean and that are willing to prepare your food as you order it cooked.   MEDICATIONS:                                                                                                                                                                Compazine/Prochlorperazine 10mg  tablet. Take 1 tablet every 6 hours as needed for nausea/vomiting. (This can make you sleepy)   EMLA cream. Apply a quarter size amount to port site 1 hour prior to chemo. Do not rub in. Cover with plastic wrap.   Over-the-Counter Meds:  Colace - 100 mg capsules - take 2 capsules daily.  If this doesn't help then you can increase to 2 capsules twice daily.  Call us if this does not help your bowels move.    Imodium 2mg  capsule. Take 2 capsules after the 1st loose stool and then 1 capsule every 2 hours until you go a total of 12 hours without having a loose stool. Call the [facility] if loose  stools continue. If diarrhea occurs at bedtime, take 2 capsules at bedtime. Then take 2 capsules every 4 hours until morning. Call [facility].    Diarrhea Sheet   If you are having loose stools/diarrhea, please purchase Imodium and begin taking as outlined:  At the first sign of poorly formed or loose stools you should begin taking Imodium (loperamide) 2 mg capsules.  Take two caplets (4mg ) followed by one caplet (2mg ) every 2 hours until you have had no diarrhea for 12 hours.  During the night take two caplets (4mg ) at bedtime and continue every 4 hours during the night until the morning.  Stop taking Imodium only after there is no sign of diarrhea for 12 hours.    Always call the [facility] if you are having loose stools/diarrhea that you can't get under control.  Loose stools/diarrhea leads to dehydration (loss of water) in your body.  We have other options of trying to get the loose stools/diarrhea to stop but you must let us know!   Constipation Sheet  Colace - 100 mg capsules - take 2 capsules daily.  If this doesn't help then you can increase to 2 capsules twice daily.  Please call if the above does not work for you.   Do not go more than 2 days without a bowel movement.  It is very important that you do not become constipated.  It will make you feel sick to your stomach (nausea) and can cause abdominal pain and vomiting.   Nausea Sheet   Compazine/Prochlorperazine 10mg  tablet. Take 1 tablet every 6 hours as needed for nausea/vomiting. (This can make you sleepy)  If you are having persistent nausea (nausea that does not stop) please call the [facility] and let us know the amount of nausea that you are experiencing.  If you begin to vomit, you need to call the [facility] and if it is  the weekend and you have vomited more than one time and can't get it to stop-go to the Emergency Room.  Persistent nausea/vomiting can lead to dehydration (loss of fluid in your body) and will make you feel terrible.   Ice chips, sips of clear liquids, foods that are @ room temperature, crackers, and toast tend to be better tolerated.   SYMPTOMS TO REPORT AS SOON AS POSSIBLE AFTER TREATMENT:   FEVER GREATER THAN 100.5 F  CHILLS WITH OR WITHOUT FEVER  NAUSEA AND VOMITING THAT IS NOT CONTROLLED WITH YOUR NAUSEA MEDICATION  UNUSUAL SHORTNESS OF BREATH  UNUSUAL BRUISING OR BLEEDING  TENDERNESS IN MOUTH AND THROAT WITH OR WITHOUT PRESENCE OF ULCERS  URINARY PROBLEMS  BOWEL PROBLEMS  UNUSUAL RASH      Wear comfortable clothing and clothing appropriate for easy access to any Portacath or PICC line. Let us know if there is anything that we can do to make your therapy better!    What to do if you need assistance after hours or on the weekends: CALL [phone].  HOLD on the line, do not hang up.  You will hear multiple messages but at the end you will be connected with a nurse triage line.  They will contact the doctor if necessary.  Most of the time they will be able to assist you.  Do not call the hospital operator.      I have been informed and understand all of the instructions given to me and have received a copy. I have been instructed to call the clinic [phone] or my  family physician as soon as possible for continued medical care, if indicated. I do not have any more questions at this time but understand that I may call the [facility] or the Patient Navigator at [phone] during office hours should I have questions or need assistance in obtaining follow-up care.

## 2017-11-18 ENCOUNTER — Telehealth: Payer: Self-pay

## 2017-11-18 NOTE — Pre-Procedure Instructions (Signed)
Patient called per Dr Nona Dell request. I spoke to [facility] his Healthcare POA concerning glucose. She states she will be there to help patient follow his diet correctly. She verbalizes understanding that if glucose is over 300 day of procedure, he will be canceled.

## 2017-11-18 NOTE — Telephone Encounter (Signed)
Contacted family to make aware of appointment cancellation.

## 2017-11-18 NOTE — Telephone Encounter (Signed)
-----   Message from Grace Isaac, MD sent at 11/18/2017 12:00 PM EDT ----- Tell does not need appointment at this point, will be glad to see him if oncology thinks he should come back.  ----- Message ----- From: Donnella Sham, RN Sent: 11/18/2017  10:53 AM EDT To: Grace Isaac, MD  Patient's family just called and stated that he is getting ready to start his first round of chemo and wanted to reschedule his appointment.  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  She was wondering if he even needs to come back to see you.  Please review and advise.  Thanks!  Caryl Pina

## 2017-11-19 ENCOUNTER — Other Ambulatory Visit (HOSPITAL_COMMUNITY): Payer: Self-pay | Admitting: Hematology

## 2017-11-19 MED ORDER — LIDOCAINE-PRILOCAINE 2.5-2.5 % EX CREA: TOPICAL_CREAM | CUTANEOUS | 0 refills | Status: DC

## 2017-11-19 MED ORDER — PROCHLORPERAZINE MALEATE 10 MG PO TABS: 10.0000 mg | ORAL_TABLET | Freq: Four times a day (QID) | ORAL | 3 refills | Status: AC | PRN

## 2017-11-22 NOTE — Progress Notes (Signed)
CC'D TO PCP °

## 2017-11-23 ENCOUNTER — Encounter (HOSPITAL_COMMUNITY): Payer: Self-pay | Admitting: *Deleted

## 2017-11-23 ENCOUNTER — Ambulatory Visit (HOSPITAL_COMMUNITY): Payer: Medicare Other | Admitting: Anesthesiology

## 2017-11-23 ENCOUNTER — Other Ambulatory Visit: Payer: Self-pay

## 2017-11-23 ENCOUNTER — Ambulatory Visit (HOSPITAL_COMMUNITY)
Admission: RE | Admit: 2017-11-23 | Discharge: 2017-11-23 | Disposition: A | Discharge status: Home / Self Care | Payer: Medicare Other | Source: Ambulatory Visit | Attending: Gastroenterology | Admitting: Gastroenterology

## 2017-11-23 ENCOUNTER — Encounter (HOSPITAL_COMMUNITY)
Admission: RE | Disposition: A | Discharge status: Home / Self Care | Payer: Self-pay | Source: Ambulatory Visit | Attending: Gastroenterology

## 2017-11-23 DIAGNOSIS — C787 Secondary malignant neoplasm of liver and intrahepatic bile duct: Secondary | ICD-10-CM | POA: Diagnosis not present

## 2017-11-23 DIAGNOSIS — C159 Malignant neoplasm of esophagus, unspecified: Secondary | ICD-10-CM | POA: Diagnosis not present

## 2017-11-23 DIAGNOSIS — F319 Bipolar disorder, unspecified: Secondary | ICD-10-CM | POA: Diagnosis not present

## 2017-11-23 DIAGNOSIS — Z9221 Personal history of antineoplastic chemotherapy: Secondary | ICD-10-CM | POA: Insufficient documentation

## 2017-11-23 DIAGNOSIS — Z885 Allergy status to narcotic agent status: Secondary | ICD-10-CM | POA: Insufficient documentation

## 2017-11-23 DIAGNOSIS — Z923 Personal history of irradiation: Secondary | ICD-10-CM | POA: Diagnosis not present

## 2017-11-23 DIAGNOSIS — Z820 Family history of epilepsy and other diseases of the nervous system: Secondary | ICD-10-CM | POA: Insufficient documentation

## 2017-11-23 DIAGNOSIS — I110 Hypertensive heart disease with heart failure: Secondary | ICD-10-CM | POA: Insufficient documentation

## 2017-11-23 DIAGNOSIS — Z7984 Long term (current) use of oral hypoglycemic drugs: Secondary | ICD-10-CM | POA: Insufficient documentation

## 2017-11-23 DIAGNOSIS — Z8249 Family history of ischemic heart disease and other diseases of the circulatory system: Secondary | ICD-10-CM | POA: Insufficient documentation

## 2017-11-23 DIAGNOSIS — G4733 Obstructive sleep apnea (adult) (pediatric): Secondary | ICD-10-CM | POA: Insufficient documentation

## 2017-11-23 DIAGNOSIS — I2510 Atherosclerotic heart disease of native coronary artery without angina pectoris: Secondary | ICD-10-CM | POA: Insufficient documentation

## 2017-11-23 DIAGNOSIS — I5042 Chronic combined systolic (congestive) and diastolic (congestive) heart failure: Secondary | ICD-10-CM | POA: Insufficient documentation

## 2017-11-23 DIAGNOSIS — Z8501 Personal history of malignant neoplasm of esophagus: Secondary | ICD-10-CM | POA: Diagnosis present

## 2017-11-23 DIAGNOSIS — M1990 Unspecified osteoarthritis, unspecified site: Secondary | ICD-10-CM | POA: Insufficient documentation

## 2017-11-23 DIAGNOSIS — Z9049 Acquired absence of other specified parts of digestive tract: Secondary | ICD-10-CM | POA: Diagnosis not present

## 2017-11-23 DIAGNOSIS — E7800 Pure hypercholesterolemia, unspecified: Secondary | ICD-10-CM | POA: Diagnosis not present

## 2017-11-23 DIAGNOSIS — F2089 Other schizophrenia: Secondary | ICD-10-CM | POA: Insufficient documentation

## 2017-11-23 DIAGNOSIS — C155 Malignant neoplasm of lower third of esophagus: Secondary | ICD-10-CM

## 2017-11-23 DIAGNOSIS — Z87442 Personal history of urinary calculi: Secondary | ICD-10-CM | POA: Diagnosis not present

## 2017-11-23 DIAGNOSIS — I252 Old myocardial infarction: Secondary | ICD-10-CM | POA: Insufficient documentation

## 2017-11-23 DIAGNOSIS — R011 Cardiac murmur, unspecified: Secondary | ICD-10-CM | POA: Insufficient documentation

## 2017-11-23 DIAGNOSIS — K2970 Gastritis, unspecified, without bleeding: Secondary | ICD-10-CM | POA: Insufficient documentation

## 2017-11-23 DIAGNOSIS — Z79899 Other long term (current) drug therapy: Secondary | ICD-10-CM | POA: Insufficient documentation

## 2017-11-23 DIAGNOSIS — K219 Gastro-esophageal reflux disease without esophagitis: Secondary | ICD-10-CM | POA: Insufficient documentation

## 2017-11-23 DIAGNOSIS — I10 Essential (primary) hypertension: Secondary | ICD-10-CM | POA: Diagnosis not present

## 2017-11-23 DIAGNOSIS — Z86010 Personal history of colonic polyps: Secondary | ICD-10-CM | POA: Insufficient documentation

## 2017-11-23 DIAGNOSIS — Z7982 Long term (current) use of aspirin: Secondary | ICD-10-CM | POA: Diagnosis not present

## 2017-11-23 DIAGNOSIS — E119 Type 2 diabetes mellitus without complications: Secondary | ICD-10-CM | POA: Insufficient documentation

## 2017-11-23 DIAGNOSIS — G4730 Sleep apnea, unspecified: Secondary | ICD-10-CM | POA: Diagnosis not present

## 2017-11-23 DIAGNOSIS — Z803 Family history of malignant neoplasm of breast: Secondary | ICD-10-CM | POA: Insufficient documentation

## 2017-11-23 HISTORY — PX: ESOPHAGOGASTRODUODENOSCOPY (EGD) WITH PROPOFOL: SHX5813

## 2017-11-23 HISTORY — PX: BIOPSY: SHX5522

## 2017-11-23 LAB — GLUCOSE, CAPILLARY
Glucose-Capillary: 266 mg/dL — ABNORMAL HIGH (ref 70–99)
Glucose-Capillary: 281 mg/dL — ABNORMAL HIGH (ref 70–99)

## 2017-11-23 SURGERY — ESOPHAGOGASTRODUODENOSCOPY (EGD) WITH PROPOFOL
Anesthesia: General

## 2017-11-23 MED ORDER — PROMETHAZINE HCL 25 MG/ML IJ SOLN: 6.2500 mg | INTRAMUSCULAR | Status: DC | PRN

## 2017-11-23 MED ORDER — PROPOFOL 500 MG/50ML IV EMUL
INTRAVENOUS | Status: DC | PRN
  Administered 2017-11-23: 150 ug/kg/min via INTRAVENOUS

## 2017-11-23 MED ORDER — LIDOCAINE VISCOUS HCL 2 % MT SOLN
10.0000 mL | Freq: Once | OROMUCOSAL | Status: AC
  Administered 2017-11-23: 10 mL via OROMUCOSAL

## 2017-11-23 MED ORDER — MIDAZOLAM HCL 2 MG/2ML IJ SOLN: 0.5000 mg | Freq: Once | INTRAMUSCULAR | Status: DC | PRN

## 2017-11-23 MED ORDER — LIDOCAINE HCL (CARDIAC) PF 50 MG/5ML IV SOSY
PREFILLED_SYRINGE | INTRAVENOUS | Status: DC | PRN
  Administered 2017-11-23: 40 mg via INTRAVENOUS

## 2017-11-23 MED ORDER — GLYCOPYRROLATE 0.2 MG/ML IJ SOLN
INTRAMUSCULAR | Status: DC | PRN
  Administered 2017-11-23: 0.2 mg via INTRAVENOUS

## 2017-11-23 MED ORDER — LIDOCAINE VISCOUS HCL 2 % MT SOLN
OROMUCOSAL | Status: AC
  Filled 2017-11-23: qty 15

## 2017-11-23 MED ORDER — LACTATED RINGERS IV SOLN
INTRAVENOUS | Status: DC
  Administered 2017-11-23: 10:00:00 via INTRAVENOUS

## 2017-11-23 NOTE — H&P (Signed)
Primary Care Physician:  Sinda Du, MD Primary Gastroenterologist:  Dr. Oneida Alar  Pre-Procedure History & Physical: HPI:  [name] is a 67 y.o. male here for SURVEILLANCE OF ESOPHAGEAL CANCER.  Past Medical History:  Diagnosis Date  . Arthritis   . Bipolar disorder ([place])   . CAD (coronary artery disease) 08/16/2016  . Cancer ([place])    esophageal with liver mets  . Cellulitis and abscess of foot 03/05/2016  . Chronic combined systolic and diastolic heart failure ([place])   . Diabetes mellitus without complication ([place])    boderline  . GERD (gastroesophageal reflux disease)   . Heart murmur 1995  . History of kidney stones   . Hypercholesteremia   . Hypertension   . Kidney stones   . Myocardial infarction ([place])   . OSA (obstructive sleep apnea)    no cpap, can't tolerate  . Schizoaffective disorder, bipolar type ([place]) 04/25/2016    Past Surgical History:  Procedure Laterality Date  . APPENDECTOMY    . BIOPSY  03/09/2017   Procedure: BIOPSY;  Surgeon: Danie Binder, MD;  [facility];  Service: Endoscopy;;  gastric esophageal  . CHOLECYSTECTOMY N/A 10/20/2013   Procedure: LAPAROSCOPIC CHOLECYSTECTOMY;  Surgeon: [name], MD;  Location: [facility] ORS;  Service: General;  Laterality: N/A;  . COLONOSCOPY WITH PROPOFOL N/A 03/09/2017   Procedure: COLONOSCOPY WITH PROPOFOL;  Surgeon: Danie Binder, MD;  [facility];  Service: Endoscopy;  Laterality: N/A;  12:15pm  . ESOPHAGOGASTRODUODENOSCOPY (EGD) WITH PROPOFOL N/A 03/09/2017   Procedure: ESOPHAGOGASTRODUODENOSCOPY (EGD) WITH PROPOFOL;  Surgeon: Danie Binder, MD;  [facility];  Service: Endoscopy;  Laterality: N/A;  . EUS N/A 04/08/2017   Procedure: UPPER ENDOSCOPIC ULTRASOUND (EUS) RADIAL;  Surgeon: Milus Banister, MD;  Location: [facility];  Service: Endoscopy;  Laterality: N/A;  . HIP SURGERY Left   . KNEE SURGERY Left   . POLYPECTOMY  03/09/2017   Procedure: POLYPECTOMY;  Surgeon:  Danie Binder, MD;  [facility];  Service: Endoscopy;;  colon   . PORTACATH PLACEMENT Left 04/07/2017   Procedure: INSERTION PORT-A-CATH LEFT SUBCLAVIAN;  Surgeon: Virl Cagey, MD;  Location: [facility];  Service: General;  Laterality: Left;  . SAVORY DILATION N/A 03/09/2017   Procedure: SAVORY DILATION;  Surgeon: Danie Binder, MD;  [facility];  Service: Endoscopy;  Laterality: N/A;    Prior to Admission medications   Medication Sig Start Date End Date Taking? Authorizing Provider  AMITIZA 24 MCG capsule Take 24 mcg by mouth 2 (two) times a week.  08/06/16  Yes [provider]  aspirin EC 81 MG tablet Take 81 mg by mouth daily.   Yes [provider]  carvedilol (COREG) 12.5 MG tablet TAKE 1 TABLET(12.5 MG) BY MOUTH TWICE DAILY Patient taking differently: Take 12.5 mg by mouth 2 (two) times daily with a meal.  09/02/17  Yes Lendon Colonel, NP  ENTRESTO 49-51 MG TAKE 1 TABLET BY MOUTH TWICE DAILY Patient taking differently: Take 1 tablet by mouth 2 (two) times daily.  07/21/17  Yes Herminio Commons, MD  fluorouracil CALGB 15176 in sodium chloride 0.9 % 150 mL Inject into the vein continuous. Over 46 hours   Yes [provider]  furosemide (LASIX) 40 MG tablet Alternate 40 mg one day and 20 mg the next Patient taking differently: Take 20-40 mg by mouth See admin instructions. Take 4m by mouth one day and alternate with  73m the next day 07/17/16  Yes KHerminio Commons MD  glipiZIDE (GLUCOTROL XL) 5 MG 24 hr tablet Take 1 tablet (5 mg total) by mouth daily with breakfast. 11/03/17  Yes Nida, GMarella Chimes MD  linagliptin (TRADJENTA) 5 MG TABS tablet Take 5 mg by mouth daily.   Yes [provider]  metFORMIN (GLUCOPHAGE) 500 MG tablet TAKE 1 TABLET(500 MG) BY MOUTH TWICE DAILY WITH A MEAL Patient taking differently: Take 500 mg by mouth 2 (two) times daily with a meal.  09/22/17  Yes Nida, GMarella Chimes MD  milk thistle  175 MG tablet Take 175 mg by mouth daily.   Yes [provider]  OLANZapine (ZYPREXA) 5 MG tablet Take 5 mg by mouth 2 (two) times daily.   Yes [provider]  pantoprazole (PROTONIX) 40 MG tablet Take 1 tablet (40 mg total) by mouth 2 (two) times daily before a meal. 09/13/17  Yes GWalden FieldA, NP  potassium chloride SA (K-DUR,KLOR-CON) 20 MEQ tablet Take 20 mEq by mouth daily.   Yes [provider]  prochlorperazine (COMPAZINE) 10 MG tablet Take 1 tablet (10 mg total) by mouth every 6 (six) hours as needed for nausea or vomiting. 11/19/17  Yes KDerek Jack MD  rosuvastatin (CRESTOR) 10 MG tablet Take 1 tablet (10 mg total) by mouth daily. 09/20/17  Yes KHerminio Commons MD  acetaminophen (TYLENOL) 325 MG tablet Take 650 mg by mouth every 6 (six) hours as needed for moderate pain.     [provider]  Blood Glucose Monitoring Suppl (ONETOUCH VERIO) w/Device KIT 1 each by Does not apply route as needed. 05/27/17   NCassandria Anger MD  glucose blood test strip 1 each by Other route 4 (four) times daily. Use as instructed qid One Touch ultra 06/22/17   NCassandria Anger MD  LEUCOVORIN CALCIUM IV Inject into the vein.    [provider]  lidocaine-prilocaine (EMLA) cream Apply small amount over port site and cover with plastic wrap one hour prior to appointment. 11/19/17   KDerek Jack MD  ondansetron (ZOFRAN ODT) 8 MG disintegrating tablet Take 1 tablet (8 mg total) by mouth every 8 (eight) hours as needed for nausea or vomiting. 04/19/17   Higgs, VMathis Dad MD  OXALIPLATIN IV Inject into the vein.    [provider]    Allergies as of 09/13/2017 - Review Complete 09/13/2017  Allergen Reaction Noted  . Codeine Nausea And Vomiting 09/27/2013  . Morphine and related Nausea And Vomiting 09/27/2013    Family History  Problem Relation Age of Onset  . Hypertension Mother   . Dementia Mother   . Cancer Mother        breast  .  Colon cancer Neg Hx   . Colon polyps Neg Hx     Social History   Socioeconomic History  . Marital status: Divorced    Spouse name: Not on file  . Number of children: Not on file  . Years of education: Not on file  . Highest education level: Not on file  Occupational History  . Not on file  Social Needs  . Financial resource strain: Not on file  . Food insecurity:    Worry: Not on file    Inability: Not on file  . Transportation needs:    Medical: Not on file    Non-medical: Not on file  Tobacco Use  . Smoking status: Never Smoker  . Smokeless tobacco: Never Used  Substance and  Sexual Activity  . Alcohol use: No  . Drug use: No  . Sexual activity: Not Currently    Birth control/protection: Implant  Lifestyle  . Physical activity:    Days per week: Not on file    Minutes per session: Not on file  . Stress: Not on file  Relationships  . Social connections:    Talks on phone: Not on file    Gets together: Not on file    Attends religious service: Not on file    Active member of club or organization: Not on file    Attends meetings of clubs or organizations: Not on file    Relationship status: Not on file  . Intimate partner violence:    Fear of current or ex partner: Not on file    Emotionally abused: Not on file    Physically abused: Not on file    Forced sexual activity: Not on file  Other Topics Concern  . Not on file  Social History Narrative  . Not on file    Review of Systems: See HPI, otherwise negative ROS   Physical Exam: BP 134/84   Pulse 75   Temp (!) 97.5 F (36.4 C) (Oral)   Resp (!) 22   SpO2 96%  General:   Alert,  pleasant and cooperative in NAD Head:  Normocephalic and atraumatic. Neck:  Supple; Lungs:  Clear throughout to auscultation.    Heart:  Regular rate and rhythm. Abdomen:  Soft, nontender and nondistended. Normal bowel sounds, without guarding, and without rebound.   Neurologic:  Alert and  oriented x4;  grossly normal  neurologically.  Impression/Plan:    SURVEILLANCE OF ESOPHAGEAL CANCER  Plan:  1. EGD TODAY DISCUSSED PROCEDURE, BENEFITS, & RISKS: < 1% chance of medication reaction, bleeding, OR perforation.

## 2017-11-23 NOTE — Anesthesia Preprocedure Evaluation (Signed)
Anesthesia Evaluation  Patient identified by MRN, date of birth, ID band Patient awake    Reviewed: Allergy & Precautions, NPO status , Patient's Chart, lab work & pertinent test results  Airway Mallampati: II  TM Distance: >3 FB Neck ROM: Full    Dental no notable dental hx. 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   Endo/Other  negative endocrine ROSdiabetes, Poorly Controlled, Type 2, Oral Hypoglycemic Agents  Renal/GU Renal disease  negative genitourinary   Musculoskeletal  (+) Arthritis , Osteoarthritis,    Abdominal   Peds negative pediatric ROS (+)  Hematology negative hematology ROS (+)   Anesthesia Other Findings   Reproductive/Obstetrics negative OB ROS                             Anesthesia Physical Anesthesia Plan  ASA: III  Anesthesia Plan: General   Post-op Pain Management:    Induction: Intravenous  PONV Risk Score and Plan:   Airway Management Planned: Simple Face Mask and Nasal Cannula  Additional Equipment:   Intra-op Plan:   Post-operative Plan:   Informed Consent: I have reviewed the patients History and Physical, chart, labs and discussed the procedure including the risks, benefits and alternatives for the proposed anesthesia with the patient or authorized representative who has indicated his/her  understanding and acceptance.   Dental advisory given  Plan Discussed with: CRNA  Anesthesia Plan Comments:         Anesthesia Quick Evaluation

## 2017-11-23 NOTE — Op Note (Signed)
[facility] Patient Name: [name] Procedure Date: 11/23/2017 9:43 AM MRN: [id number] Date of Birth: 07/01/1950 Attending MD: Barney Drain MD, MD CSN: [id number] Age: 67 Admit Type: Outpatient Procedure:                Upper GI endoscopy WITH COLD FORCEPS BIOPSY Indications:              Personal history of malignant esophageal neoplasm Providers:                Barney Drain MD, MD, Otis Peak B. Sharon Seller, RN,                            Randa Spike, Technician Referring MD:             Derek Jack, MD Medicines:                Propofol per Anesthesia Complications:            No immediate complications. Estimated Blood Loss:     Estimated blood loss was minimal. Procedure:                Pre-Anesthesia Assessment:                           - Prior to the procedure, a History and Physical                            was performed, and patient medications and                            allergies were reviewed. The patient's tolerance of                            previous anesthesia was also reviewed. The risks                            and benefits of the procedure and the sedation                            options and risks were discussed with the patient.                            All questions were answered, and informed consent                            was obtained. Prior Anticoagulants: The patient has                            taken aspirin, last dose was 1 day prior to                            procedure. ASA Grade Assessment: II - A patient                            with mild systemic disease. After reviewing the  risks and benefits, the patient was deemed in                            satisfactory condition to undergo the procedure.                            After obtaining informed consent, the endoscope was                            passed under direct vision. Throughout the                            procedure, the  patient's blood pressure, pulse, and                            oxygen saturations were monitored continuously. The                            GIF-H190 ([id number]) scope was introduced through the                            mouth, and advanced to the second part of duodenum.                            The upper GI endoscopy was technically difficult                            and complex due to the patient's agitation.                            Successful completion of the procedure was aided by                            increasing the dose of sedation medication. The                            patient tolerated the procedure fairly well. Scope In: 10:08:10 AM Scope Out: 10:20:36 AM Total Procedure Duration: 0 hours 12 minutes 26 seconds  Findings:      A medium-sized, fungating mass with bleeding and stigmata of recent       bleeding was found in the lower third of the esophagus, 30 cm from the       incisors AND EXTENDING TO THE EGJ(35 CM FROM THE INCISORS). MASS       OCCUPIES ~1/2 OF THE LUMEN. The mass was partially obstructing and       partially circumferential (involving one-half of the lumen       circumference). This was biopsied with a cold forceps for histology.      Patchy mild inflammation characterized by congestion (edema) and       erythema was found in the entire examined stomach. NO SATELLITE LESION       APPRECIATED.      The examined duodenum was normal. Impression:               - Partially obstructing, malignant esophageal  tumor                            was found in the lower third of the esophagus WHICH                            HAS RESPONDED TO CHEMO/XRT                           - MILD Gastritis. Moderate Sedation:      Per Anesthesia Care Recommendation:           - Patient has a contact number available for                            emergencies. The signs and symptoms of potential                            delayed complications were discussed with the                             patient. Return to normal activities tomorrow.                            Written discharge instructions were provided to the                            patient.                           - Soft diet.                           - Continue present medications.                           - Await pathology results.                           - Return to my office PRN. Procedure Code(s):        --- Professional ---                           [phone], Esophagogastroduodenoscopy, flexible,                            transoral; with biopsy, single or multiple Diagnosis Code(s):        --- Professional ---                           C15.5, Malignant neoplasm of lower third of                            esophagus                           K29.70, Gastritis, unspecified, without bleeding CPT copyright 2018 American Medical Association. All rights reserved. The codes  documented in this report are preliminary and upon coder review may  be revised to meet current compliance requirements. Barney Drain, MD Barney Drain MD, MD 11/23/2017 10:35:52 AM This report has been signed electronically. Number of Addenda: 0

## 2017-11-23 NOTE — Discharge Instructions (Signed)
THE MAS IS STILL IN YOUR ESOPHAGUS. I biopsied your ESOPHAGUS.   HOLD ASPIRIN. It increases your risk of bleeding from the mass in your esophagus and you have a low platelet count.  DRINK WATER TO KEEP YOUR URINE LIGHT YELLOW.  FOLLOW A SOFT MECHANICAL DIET.  MEATS SHOULD BE CHOPPED OR GROUND ONLY. DO NOT EAT CHUNKS OF ANYTHING. SEE INFO BELOW.   YOUR BIOPSY RESULTS WILL BE BACK IN 5 BUSINESS DAYS.  FOLLOW UP IN THE OFFICE WILL BE SCHEDULED IF NEEDED after THE PATH REPORT COMES BACK.   UPPER ENDOSCOPY AFTER CARE Read the instructions outlined below and refer to this sheet in the next week. These discharge instructions provide you with general information on caring for yourself after you leave the hospital. While your treatment has been planned according to the most current medical practices available, unavoidable complications occasionally occur. If you have any problems or questions after discharge, call DR. FIELDS, [phone].  ACTIVITY  You may resume your regular activity, but move at a slower pace for the next 24 hours.   Take frequent rest periods for the next 24 hours.   Walking will help get rid of the air and reduce the bloated feeling in your belly (abdomen).   No driving for 24 hours (because of the medicine (anesthesia) used during the test).   You may shower.   Do not sign any important legal documents or operate any machinery for 24 hours (because of the anesthesia used during the test).    NUTRITION  Drink plenty of fluids.   You may resume your normal diet as instructed by your doctor.   Begin with a light meal and progress to your normal diet. Heavy or fried foods are harder to digest and may make you feel sick to your stomach (nauseated).   Avoid alcoholic beverages for 24 hours or as instructed.    MEDICATIONS  You may resume your normal medications.   WHAT YOU CAN EXPECT TODAY  Some feelings of bloating in the abdomen.   Passage of more gas  than usual.    IF YOU HAD A BIOPSY TAKEN DURING THE UPPER ENDOSCOPY:  Eat a soft diet IF YOU HAVE NAUSEA, BLOATING, ABDOMINAL PAIN, OR VOMITING.    FINDING OUT THE RESULTS OF YOUR TEST Not all test results are available during your visit. DR. Oneida Alar WILL CALL YOU WITHIN 14 DAYS OF YOUR PROCEDUE WITH YOUR RESULTS. Do not assume everything is normal if you have not heard from DR. FIELDS, CALL HER OFFICE AT [phone].  SEEK IMMEDIATE MEDICAL ATTENTION AND CALL THE OFFICE: [phone] IF:  You have more than a spotting of blood in your stool.   Your belly is swollen (abdominal distention).   You are nauseated or vomiting.   You have a temperature over 101F.   You have abdominal pain or discomfort that is severe or gets worse throughout the day.  SOFT MECHANICAL DIET This SOFT MECHANICAL DIET is restricted to:  Foods that are moist, soft-textured, and easy to chew and swallow.   Meats that are ground or are minced.   Foods that do not include bread or bread-like textures except soft pancakes, well-moistened with syrup or sauce.   Textures with some chewing ability required.   Casseroles without rice.   Cooked vegetables that are less than half an inch in size and easily mashed with a fork. No cooked corn, peas, broccoli, cauliflower, cabbage, Brussels sprouts, asparagus, or other fibrous, non-tender or rubbery cooked vegetables.  Canned fruit except for pineapple. Fruit must be cut into pieces no larger than half an inch in size.   Foods that do not include nuts, seeds, coconut, or sticky textures.   FOOD TEXTURES FOR DYSPHAGIA DIET LEVEL 2 -SOFT MECHANICAL DIET (includes all foods on Dysphagia Diet Level 1 - Pureed, in addition to the foods listed below)  FOOD GROUP: Breads. RECOMMENDED: Soft pancakes, well-moistened with syrup or sauce.  AVOID: All others.  FOOD GROUP: Cereals.  RECOMMENDED: Cooked cereals with little texture, including oatmeal. Unprocessed wheat  bran stirred into cereals for bulk. Note: If thin liquids are restricted, it is important that all of the liquid is absorbed into the cereal.  AVOID: All dry cereals and any cooked cereals that may contain flax seeds or other seeds or nuts. Whole-grain, dry, or coarse cereals. Cereals with nuts, seeds, dried fruit, and/or coconut.  FOOD GROUP: Desserts. RECOMMENDED: Pudding, custard. Soft fruit pies with bottom crust only. Canned fruit (excluding pineapple). Soft, moist cakes with icing.Frozen malts, milk shakes, frozen yogurt, eggnog, nutritional supplements, ice cream, sherbet, regular or sugar-free gelatin, or any foods that become thin liquid at either room (70 F) or body temperature (98 F).  AVOID: Dry, coarse cakes and cookies. Anything with nuts, seeds, coconut, pineapple, or dried fruit. Breakfast yogurt with nuts. Rice or bread pudding.  FOOD GROUP: Fats. RECOMMENDED: Butter, margarine, cream for cereal (depending on liquid consistency recommendations), gravy, cream sauces, sour cream, sour cream dips with soft additives, mayonnaise, salad dressings, cream cheese, cream cheese spreads with soft additives, whipped toppings.  AVOID: All fats with coarse or chunky additives.  FOOD GROUP: Fruits. RECOMMENDED: Soft drained, canned, or cooked fruits without seeds or skin. Fresh soft and ripe banana. Fruit juices with a small amount of pulp. If thin liquids are restricted, fruit juices should be thickened to appropriate consistency.  AVOID: Fresh or frozen fruits. Cooked fruit with skin or seeds. Dried fruits. Fresh, canned, or cooked pineapple.  FOOD GROUP: Meats and Meat Substitutes. (Meat pieces should not exceed 1/4 of an inch cube and should be tender.) RECOMMENDED: Moistened ground or cooked meat, poultry, or fish. Moist ground or tender meat may be served with gravy or sauce. Casseroles without rice. Moist macaroni and cheese, well-cooked pasta with meat sauce, tuna noodle casserole,  soft, moist lasagna. Moist meatballs, meatloaf, or fish loaf. Protein salads, such as tuna or egg without large chunks, celery, or onion. Cottage cheese, smooth quiche without large chunks. Poached, scrambled, or soft-cooked eggs (egg yolks should not be runny but should be moist and able to be mashed with butter, margarine, or other moisture added to them). (Cook eggs to 160 F or use pasteurized eggs for safety.) Souffls may have small, soft chunks. Tofu. Well-cooked, slightly mashed, moist legumes, such as baked beans. All meats or protein substitutes should be served with sauces or moistened to help maintain cohesiveness in the oral cavity.  AVOID: Dry meats, tough meats (such as bacon, sausage, hot dogs, bratwurst). Dry casseroles or casseroles with rice or large chunks. Peanut butter. Cheese slices and cubes. Hard-cooked or crisp fried eggs. Sandwiches.Pizza.  FOOD GROUP: Potatoes and Starches. RECOMMENDED: Well-cooked, moistened, boiled, baked, or mashed potatoes. Well-cooked shredded hash brown potatoes that are not crisp. (All potatoes need to be moist and in sauces.)Well-cooked noodles in sauce. Spaetzel or soft dumplings that have been moistened with butter or gravy.  AVOID: Potato skins and chips. Fried or French-fried potatoes. Rice.  FOOD GROUP: Soups. RECOMMENDED: Soups with easy-to-chew  or easy-to-swallow meats or vegetables: Particle sizes in soups should be less than 1/2 inch. Soups will need to be thickened to appropriate consistency if soup is thinner than prescribed liquid consistency.  AVOID: Soups with large chunks of meat and vegetables. Soups with rice, corn, peas.  FOOD GROUP: Vegetables. RECOMMENDED: All soft, well-cooked vegetables. Vegetables should be less than a half inch. Should be easily mashed with a fork.  AVOID: Cooked corn and peas. Broccoli, cabbage, Brussels sprouts, asparagus, or other fibrous, non-tender or rubbery cooked vegetables.  FOOD GROUP:  Miscellaneous. RECOMMENDED: Jams and preserves without seeds, jelly. Sauces, salsas, etc., that may have small tender chunks less than 1/2 inch. Soft, smooth chocolate bars that are easily chewed.  AVOID: Seeds, nuts, coconut, or sticky foods. Chewy candies such as caramels or licorice.   Moderate Conscious Sedation, Adult, Care After These instructions provide you with information about caring for yourself after your procedure. Your health care provider may also give you more specific instructions. Your treatment has been planned according to current medical practices, but problems sometimes occur. Call your health care provider if you have any problems or questions after your procedure. What can I expect after the procedure? After your procedure, it is common:  To feel sleepy for several hours.  To feel clumsy and have poor balance for several hours.  To have poor judgment for several hours.  To vomit if you eat too soon.  Follow these instructions at home: For at least 24 hours after the procedure:   Do not: ? Participate in activities where you could fall or become injured. ? Drive. ? Use heavy machinery. ? Drink alcohol. ? Take sleeping pills or medicines that cause drowsiness. ? Make important decisions or sign legal documents. ? Take care of children on your own.  Rest. Eating and drinking  Follow the diet recommended by your health care provider.  If you vomit: ? Drink water, juice, or soup when you can drink without vomiting. ? Make sure you have little or no nausea before eating solid foods. General instructions  Have a responsible adult stay with you until you are awake and alert.  Take over-the-counter and prescription medicines only as told by your health care provider.  If you smoke, do not smoke without supervision.  Keep all follow-up visits as told by your health care provider. This is important. Contact a health care provider if:  You keep feeling  nauseous or you keep vomiting.  You feel light-headed.  You develop a rash.  You have a fever. Get help right away if:  You have trouble breathing. This information is not intended to replace advice given to you by your health care provider. Make sure you discuss any questions you have with your health care provider. Document Released: 11/23/2012 Document Revised: 07/08/2015 Document Reviewed: 05/25/2015 Elsevier Interactive Patient Education  [employer].

## 2017-11-23 NOTE — Anesthesia Postprocedure Evaluation (Signed)
Anesthesia Post Note  Patient: [name]  Procedure(s) Performed: ESOPHAGOGASTRODUODENOSCOPY (EGD) WITH PROPOFOL (N/A ) BIOPSY  Anesthesia Type: General Level of consciousness: awake and alert Pain management: pain level controlled Vital Signs Assessment: post-procedure vital signs reviewed and stable Respiratory status: spontaneous breathing Cardiovascular status: stable Postop Assessment: no apparent nausea or vomiting Anesthetic complications: no     Last Vitals:  Vitals:   11/23/17 0858  BP: 134/84  Pulse: 75  Resp: (!) 22  Temp: (!) 36.4 C  SpO2: 96%    Last Pain:  Vitals:   11/23/17 1001  TempSrc:   PainSc: 0-No pain                 ADAMS, AMY A

## 2017-11-23 NOTE — Transfer of Care (Signed)
Immediate Anesthesia Transfer of Care Note  Patient: [name]  Procedure(s) Performed: ESOPHAGOGASTRODUODENOSCOPY (EGD) WITH PROPOFOL (N/A ) BIOPSY  Patient Location: PACU  Anesthesia Type:MAC  Level of Consciousness: awake, alert , oriented and patient cooperative  Airway & Oxygen Therapy: Patient Spontanous Breathing  Post-op Assessment: Report given to RN and Post -op Vital signs reviewed and stable  Post vital signs: Reviewed and stable  Last Vitals:  Vitals Value Taken Time  BP 113/90 11/23/2017 10:27 AM  Temp    Pulse 75 11/23/2017 10:29 AM  Resp 20 11/23/2017 10:29 AM  SpO2 93 % 11/23/2017 10:29 AM  Vitals shown include unvalidated device data.  Last Pain:  Vitals:   11/23/17 1001  TempSrc:   PainSc: 0-No pain      Patients Stated Pain Goal: 8 (60/63/01 6010)  Complications: No apparent anesthesia complications

## 2017-11-23 NOTE — Anesthesia Procedure Notes (Signed)
Procedure Name: [place] Performed by: Andree Elk Amy A, CRNA Pre-anesthesia Checklist: Patient identified, Emergency Drugs available, Suction available, Patient being monitored and Timeout performed Oxygen Delivery Method: Simple face mask

## 2017-11-24 ENCOUNTER — Telehealth: Payer: Self-pay | Admitting: Gastroenterology

## 2017-11-24 ENCOUNTER — Inpatient Hospital Stay (HOSPITAL_COMMUNITY): Payer: Medicare Other

## 2017-11-24 ENCOUNTER — Encounter (HOSPITAL_COMMUNITY): Payer: Self-pay

## 2017-11-24 VITALS — BP 137/78 | HR 81 | Temp 98.0°F | Resp 18 | Wt 188.0 lb

## 2017-11-24 DIAGNOSIS — I700 Atherosclerosis of aorta: Secondary | ICD-10-CM | POA: Diagnosis not present

## 2017-11-24 DIAGNOSIS — C158 Malignant neoplasm of overlapping sites of esophagus: Secondary | ICD-10-CM

## 2017-11-24 DIAGNOSIS — D696 Thrombocytopenia, unspecified: Secondary | ICD-10-CM | POA: Diagnosis not present

## 2017-11-24 DIAGNOSIS — R911 Solitary pulmonary nodule: Secondary | ICD-10-CM | POA: Diagnosis not present

## 2017-11-24 DIAGNOSIS — Z5111 Encounter for antineoplastic chemotherapy: Secondary | ICD-10-CM | POA: Diagnosis not present

## 2017-11-24 DIAGNOSIS — C787 Secondary malignant neoplasm of liver and intrahepatic bile duct: Secondary | ICD-10-CM | POA: Diagnosis not present

## 2017-11-24 DIAGNOSIS — C159 Malignant neoplasm of esophagus, unspecified: Secondary | ICD-10-CM | POA: Diagnosis not present

## 2017-11-24 LAB — COMPREHENSIVE METABOLIC PANEL
ALK PHOS: 209 U/L — AB (ref 38–126)
ALT: 27 U/L (ref 0–44)
ANION GAP: 7 (ref 5–15)
AST: 30 U/L (ref 15–41)
Albumin: 3.3 g/dL — ABNORMAL LOW (ref 3.5–5.0)
BUN: 17 mg/dL (ref 8–23)
CO2: 25 mmol/L (ref 22–32)
CREATININE: 0.96 mg/dL (ref 0.61–1.24)
Calcium: 8.9 mg/dL (ref 8.9–10.3)
Chloride: 103 mmol/L (ref 98–111)
Glucose, Bld: 469 mg/dL — ABNORMAL HIGH (ref 70–99)
Potassium: 4 mmol/L (ref 3.5–5.1)
SODIUM: 135 mmol/L (ref 135–145)
TOTAL PROTEIN: 6.6 g/dL (ref 6.5–8.1)
Total Bilirubin: 0.8 mg/dL (ref 0.3–1.2)

## 2017-11-24 LAB — CBC WITH DIFFERENTIAL/PLATELET
ABS IMMATURE GRANULOCYTES: 0.02 10*3/uL (ref 0.00–0.07)
Basophils Absolute: 0 10*3/uL (ref 0.0–0.1)
Basophils Relative: 1 %
EOS ABS: 0.2 10*3/uL (ref 0.0–0.5)
EOS PCT: 3 %
HEMATOCRIT: 46.6 % (ref 39.0–52.0)
HEMOGLOBIN: 15.3 g/dL (ref 13.0–17.0)
Immature Granulocytes: 0 %
LYMPHS PCT: 6 %
Lymphs Abs: 0.4 10*3/uL — ABNORMAL LOW (ref 0.7–4.0)
MCH: 28.7 pg (ref 26.0–34.0)
MCHC: 32.8 g/dL (ref 30.0–36.0)
MCV: 87.4 fL (ref 80.0–100.0)
Monocytes Absolute: 0.7 10*3/uL (ref 0.1–1.0)
Monocytes Relative: 10 %
Neutro Abs: 5 10*3/uL (ref 1.7–7.7)
Neutrophils Relative %: 80 %
Platelets: 113 10*3/uL — ABNORMAL LOW (ref 150–400)
RBC: 5.33 MIL/uL (ref 4.22–5.81)
RDW: 14.5 % (ref 11.5–15.5)
WBC: 6.3 10*3/uL (ref 4.0–10.5)
nRBC: 0 % (ref 0.0–0.2)

## 2017-11-24 MED ORDER — FLUOROURACIL CHEMO INJECTION 2.5 GM/50ML
320.0000 mg/m2 | Freq: Once | INTRAVENOUS | Status: AC
  Administered 2017-11-24: 650 mg via INTRAVENOUS
  Filled 2017-11-24: qty 13

## 2017-11-24 MED ORDER — SODIUM CHLORIDE 0.9 % IV SOLN
1920.0000 mg/m2 | INTRAVENOUS | Status: DC
  Administered 2017-11-24: 3800 mg via INTRAVENOUS
  Filled 2017-11-24: qty 30

## 2017-11-24 MED ORDER — DEXTROSE 5 % IV SOLN
Freq: Once | INTRAVENOUS | Status: AC
  Administered 2017-11-24: 11:00:00 via INTRAVENOUS

## 2017-11-24 MED ORDER — SODIUM CHLORIDE 0.9% FLUSH
10.0000 mL | INTRAVENOUS | Status: DC | PRN
  Administered 2017-11-24: 10 mL
  Filled 2017-11-24: qty 10

## 2017-11-24 MED ORDER — DEXTROSE 5 % IV SOLN
Freq: Once | INTRAVENOUS | Status: AC
  Administered 2017-11-24: 10:00:00 via INTRAVENOUS

## 2017-11-24 MED ORDER — SODIUM CHLORIDE 0.9 % IV SOLN
10.0000 mg | Freq: Once | INTRAVENOUS | Status: AC
  Administered 2017-11-24: 10 mg via INTRAVENOUS
  Filled 2017-11-24: qty 1

## 2017-11-24 MED ORDER — PALONOSETRON HCL INJECTION 0.25 MG/5ML
0.2500 mg | Freq: Once | INTRAVENOUS | Status: AC
  Administered 2017-11-24: 0.25 mg via INTRAVENOUS
  Filled 2017-11-24: qty 5

## 2017-11-24 MED ORDER — LEUCOVORIN CALCIUM INJECTION 350 MG
305.0000 mg/m2 | Freq: Once | INTRAVENOUS | Status: AC
  Administered 2017-11-24: 600 mg via INTRAVENOUS
  Filled 2017-11-24: qty 30

## 2017-11-24 MED ORDER — OXALIPLATIN CHEMO INJECTION 100 MG/20ML
68.0000 mg/m2 | Freq: Once | INTRAVENOUS | Status: AC
  Administered 2017-11-24: 135 mg via INTRAVENOUS
  Filled 2017-11-24: qty 20

## 2017-11-24 NOTE — Patient Instructions (Signed)
[facility] Discharge Instructions for Patients Receiving Chemotherapy   Beginning January 23rd 2017 lab work for the [facility] will be done in the  Main lab at [facility] on 1st floor. If you have a lab appointment with the [facility] please come in thru the  Main Entrance and check in at the main information desk   Today you received the following chemotherapy agents Oxaliplatin,Leucovorin and 5FU. Follow-up as scheduled. Call clinic for any questions or concerns  To help prevent nausea and vomiting after your treatment, we encourage you to take your nausea medication   If you develop nausea and vomiting, or diarrhea that is not controlled by your medication, call the clinic.  The clinic phone number is [phone]. Office hours are Monday-Friday 8:30am-5:00pm.  BELOW ARE SYMPTOMS THAT SHOULD BE REPORTED IMMEDIATELY:  *FEVER GREATER THAN 101.0 F  *CHILLS WITH OR WITHOUT FEVER  NAUSEA AND VOMITING THAT IS NOT CONTROLLED WITH YOUR NAUSEA MEDICATION  *UNUSUAL SHORTNESS OF BREATH  *UNUSUAL BRUISING OR BLEEDING  TENDERNESS IN MOUTH AND THROAT WITH OR WITHOUT PRESENCE OF ULCERS  *URINARY PROBLEMS  *BOWEL PROBLEMS  UNUSUAL RASH Items with * indicate a potential emergency and should be followed up as soon as possible. If you have an emergency after office hours please contact your primary care physician or go to the nearest emergency department.  Please call the clinic during office hours if you have any questions or concerns.   You may also contact the Patient Navigator at [phone] should you have any questions or need assistance in obtaining follow up care.      Resources For Cancer Patients and their Caregivers ? American Cancer Society: Can assist with transportation, wigs, general needs, runs Look Good Feel Better.        252-542-7596 ? Cancer Care: Provides financial assistance, online support groups, medication/co-pay assistance.   1-800-813-HOPE (862)586-4474) ? [place] [facility] Assists [place] cancer patients and their families through emotional , educational and financial support.  (989) 040-3394 ? [place] DSS Where to apply for food stamps, Medicaid and utility assistance. (661)751-6159 ? [facility]: Transportation to medical appointments. 380-690-3907 ? Social Security Administration: May apply for disability if have a Stage IV cancer. ([phone] [phone] ? [employer], Disability and Transit Services: Assists with nutrition, care and transit needs. ([phone])568-6317

## 2017-11-24 NOTE — Progress Notes (Signed)
[facility] reviewed with Dr. Delton Coombes and pt approved for chemo tx today per MD                       [name] tolerated chemo tx well without complaints or incident. Drug specific information and 5FU pump information as well as spill kit given to pt and reviewed with pt and friend. All questions answered with consent and ambulatory infusion instructions signed. Pt viewed the video for the ambulatory infusion pump also. Pt discharged with 5FU pump infusing without issues. VSS upon discharge. Pt discharged via wheelchair in satisfactory condition accompanied by friends

## 2017-11-24 NOTE — Telephone Encounter (Signed)
PLEASE CALL PT'S POA. HIS BIOPSIES STILL SHOW ESOPHAGEAL CANCER.

## 2017-11-25 ENCOUNTER — Telehealth: Payer: Self-pay | Admitting: Gastroenterology

## 2017-11-25 ENCOUNTER — Encounter: Payer: Self-pay | Admitting: Cardiothoracic Surgery

## 2017-11-25 ENCOUNTER — Telehealth (HOSPITAL_COMMUNITY): Payer: Self-pay

## 2017-11-25 NOTE — Telephone Encounter (Signed)
See other phone note for today re: procedure result.

## 2017-11-25 NOTE — Telephone Encounter (Signed)
[name] was returning a call from MB. Please call back [phone]

## 2017-11-25 NOTE — Telephone Encounter (Signed)
POA had returned call. I called her back and informed her. Also aware message was sent to Dr. Delton Coombes.

## 2017-11-25 NOTE — Telephone Encounter (Signed)
Chemo 24 hr F/U call: Spoke with pt's family member who reported that he has been doing well since receiving his chemo tx yesterday. She denies any difficulty with the ambulatory infusion pump and pt has not experienced any problems at this time. Encouraged then to call for any questions or problems and the pt will return tomorrow to have the pump discontinued. Understanding was verbalized

## 2017-11-25 NOTE — Telephone Encounter (Signed)
Tried to call pt's POA L[place]), no answer, LMOVM for return call.

## 2017-11-26 ENCOUNTER — Inpatient Hospital Stay (HOSPITAL_COMMUNITY): Payer: Medicare Other

## 2017-11-26 ENCOUNTER — Encounter (HOSPITAL_COMMUNITY): Payer: Self-pay

## 2017-11-26 VITALS — BP 129/73 | HR 77 | Temp 97.7°F | Resp 18

## 2017-11-26 DIAGNOSIS — C158 Malignant neoplasm of overlapping sites of esophagus: Secondary | ICD-10-CM

## 2017-11-26 DIAGNOSIS — C159 Malignant neoplasm of esophagus, unspecified: Secondary | ICD-10-CM | POA: Diagnosis not present

## 2017-11-26 MED ORDER — SODIUM CHLORIDE 0.9% FLUSH: 10.0000 mL | INTRAVENOUS | Status: DC | PRN

## 2017-11-26 MED ORDER — HEPARIN SOD (PORK) LOCK FLUSH 100 UNIT/ML IV SOLN: 500.0000 [IU] | Freq: Once | INTRAVENOUS | Status: DC | PRN

## 2017-11-26 NOTE — Progress Notes (Signed)
83 Pt reported to clinic to have 5FU pump discontinued with port flush but he had already pulled the needle from the port after the 5FU infusion was complete and he had turned the pump off. Reviewed with the pt,who lives alone, and his support person to only stop the infusion after the next tx and do not remove the needle,we will do that at the clinic when he returns. Pt verbalized understanding. Dr. Delton Coombes informed of this and did not feel like we needed to re-access the port to flush with NS or Heparin. Portacath site clean and dry without redness or swelling noted. VSS Pt denies any complaints at this time. Pt discharged self ambulatory in satisfactory condition accompanied by his support person

## 2017-11-29 ENCOUNTER — Other Ambulatory Visit (HOSPITAL_COMMUNITY): Payer: Self-pay | Admitting: Pulmonary Disease

## 2017-11-29 ENCOUNTER — Encounter (HOSPITAL_COMMUNITY): Payer: Self-pay | Admitting: Gastroenterology

## 2017-11-29 ENCOUNTER — Encounter (HOSPITAL_COMMUNITY): Payer: Self-pay | Admitting: Hematology

## 2017-11-29 ENCOUNTER — Other Ambulatory Visit: Payer: Self-pay | Admitting: Pulmonary Disease

## 2017-11-29 DIAGNOSIS — C7889 Secondary malignant neoplasm of other digestive organs: Secondary | ICD-10-CM

## 2017-11-29 DIAGNOSIS — I5022 Chronic systolic (congestive) heart failure: Secondary | ICD-10-CM | POA: Diagnosis not present

## 2017-11-29 DIAGNOSIS — C801 Malignant (primary) neoplasm, unspecified: Secondary | ICD-10-CM

## 2017-11-29 DIAGNOSIS — F319 Bipolar disorder, unspecified: Secondary | ICD-10-CM | POA: Diagnosis not present

## 2017-11-29 DIAGNOSIS — I2510 Atherosclerotic heart disease of native coronary artery without angina pectoris: Secondary | ICD-10-CM | POA: Diagnosis not present

## 2017-11-29 DIAGNOSIS — C787 Secondary malignant neoplasm of liver and intrahepatic bile duct: Secondary | ICD-10-CM | POA: Diagnosis not present

## 2017-11-30 ENCOUNTER — Other Ambulatory Visit (HOSPITAL_COMMUNITY): Payer: Self-pay | Admitting: Nurse Practitioner

## 2017-12-01 DIAGNOSIS — N182 Chronic kidney disease, stage 2 (mild): Secondary | ICD-10-CM | POA: Diagnosis not present

## 2017-12-01 DIAGNOSIS — C159 Malignant neoplasm of esophagus, unspecified: Secondary | ICD-10-CM | POA: Diagnosis not present

## 2017-12-01 DIAGNOSIS — I5022 Chronic systolic (congestive) heart failure: Secondary | ICD-10-CM | POA: Diagnosis not present

## 2017-12-01 DIAGNOSIS — E1122 Type 2 diabetes mellitus with diabetic chronic kidney disease: Secondary | ICD-10-CM | POA: Diagnosis not present

## 2017-12-01 DIAGNOSIS — G4733 Obstructive sleep apnea (adult) (pediatric): Secondary | ICD-10-CM | POA: Diagnosis not present

## 2017-12-01 DIAGNOSIS — F319 Bipolar disorder, unspecified: Secondary | ICD-10-CM | POA: Diagnosis not present

## 2017-12-01 DIAGNOSIS — I2510 Atherosclerotic heart disease of native coronary artery without angina pectoris: Secondary | ICD-10-CM | POA: Diagnosis not present

## 2017-12-01 DIAGNOSIS — Z79899 Other long term (current) drug therapy: Secondary | ICD-10-CM | POA: Diagnosis not present

## 2017-12-01 DIAGNOSIS — E1151 Type 2 diabetes mellitus with diabetic peripheral angiopathy without gangrene: Secondary | ICD-10-CM | POA: Diagnosis not present

## 2017-12-01 DIAGNOSIS — H409 Unspecified glaucoma: Secondary | ICD-10-CM | POA: Diagnosis not present

## 2017-12-01 DIAGNOSIS — Z7984 Long term (current) use of oral hypoglycemic drugs: Secondary | ICD-10-CM | POA: Diagnosis not present

## 2017-12-01 DIAGNOSIS — I130 Hypertensive heart and chronic kidney disease with heart failure and stage 1 through stage 4 chronic kidney disease, or unspecified chronic kidney disease: Secondary | ICD-10-CM | POA: Diagnosis not present

## 2017-12-01 DIAGNOSIS — C787 Secondary malignant neoplasm of liver and intrahepatic bile duct: Secondary | ICD-10-CM | POA: Diagnosis not present

## 2017-12-03 ENCOUNTER — Encounter (HOSPITAL_COMMUNITY): Payer: Self-pay

## 2017-12-03 ENCOUNTER — Ambulatory Visit (HOSPITAL_COMMUNITY)
Admission: RE | Admit: 2017-12-03 | Discharge: 2017-12-03 | Disposition: A | Discharge status: Home / Self Care | Payer: Medicare Other | Source: Ambulatory Visit | Attending: Pulmonary Disease | Admitting: Pulmonary Disease

## 2017-12-03 ENCOUNTER — Encounter (HOSPITAL_COMMUNITY): Payer: Self-pay | Admitting: Hematology

## 2017-12-03 DIAGNOSIS — C787 Secondary malignant neoplasm of liver and intrahepatic bile duct: Secondary | ICD-10-CM | POA: Diagnosis not present

## 2017-12-03 DIAGNOSIS — C7889 Secondary malignant neoplasm of other digestive organs: Secondary | ICD-10-CM | POA: Insufficient documentation

## 2017-12-03 DIAGNOSIS — E1151 Type 2 diabetes mellitus with diabetic peripheral angiopathy without gangrene: Secondary | ICD-10-CM | POA: Diagnosis not present

## 2017-12-03 DIAGNOSIS — C159 Malignant neoplasm of esophagus, unspecified: Secondary | ICD-10-CM | POA: Diagnosis not present

## 2017-12-03 DIAGNOSIS — E1122 Type 2 diabetes mellitus with diabetic chronic kidney disease: Secondary | ICD-10-CM | POA: Diagnosis not present

## 2017-12-03 DIAGNOSIS — I2510 Atherosclerotic heart disease of native coronary artery without angina pectoris: Secondary | ICD-10-CM | POA: Diagnosis not present

## 2017-12-03 DIAGNOSIS — G9389 Other specified disorders of brain: Secondary | ICD-10-CM | POA: Diagnosis not present

## 2017-12-03 DIAGNOSIS — F319 Bipolar disorder, unspecified: Secondary | ICD-10-CM | POA: Diagnosis not present

## 2017-12-03 DIAGNOSIS — C801 Malignant (primary) neoplasm, unspecified: Secondary | ICD-10-CM

## 2017-12-03 MED ORDER — IOHEXOL 300 MG/ML  SOLN
75.0000 mL | Freq: Once | INTRAMUSCULAR | Status: AC | PRN
  Administered 2017-12-03: 75 mL via INTRAVENOUS

## 2017-12-04 DIAGNOSIS — I2510 Atherosclerotic heart disease of native coronary artery without angina pectoris: Secondary | ICD-10-CM | POA: Diagnosis not present

## 2017-12-04 DIAGNOSIS — E1122 Type 2 diabetes mellitus with diabetic chronic kidney disease: Secondary | ICD-10-CM | POA: Diagnosis not present

## 2017-12-04 DIAGNOSIS — C159 Malignant neoplasm of esophagus, unspecified: Secondary | ICD-10-CM | POA: Diagnosis not present

## 2017-12-04 DIAGNOSIS — F319 Bipolar disorder, unspecified: Secondary | ICD-10-CM | POA: Diagnosis not present

## 2017-12-04 DIAGNOSIS — E1151 Type 2 diabetes mellitus with diabetic peripheral angiopathy without gangrene: Secondary | ICD-10-CM | POA: Diagnosis not present

## 2017-12-04 DIAGNOSIS — C787 Secondary malignant neoplasm of liver and intrahepatic bile duct: Secondary | ICD-10-CM | POA: Diagnosis not present

## 2017-12-06 NOTE — Progress Notes (Signed)
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  Patient presented with symptoms of balance issues, memory loss.  CT Head 12/03/2017: Heterogenously enhancing mass in the right lower cerebellum estimated at 24-26 millimeters diameter is associated with mild regional cerebellar edema.  There is no significant mass effect.  CT CAP 10/25/2017: disease progression with development of bilateral hepatic metastasis.  Enlargement of a portacaval lymph node also suspicious for metastatic disease.  Past or anticipated interventions, if any, per neurosurgery:   Past or anticipated interventions, if any, per medical oncology:  Dr. Delton Coombes 11/17/2017 -Metastatic GE junction adenocarcinoma: -7 cm long, bulky, focally circumferential, with nearby satellite lesion 2 cm distal to the GE junction, UT3N0 -Radiation therapy started on 04/19/2017 through 05/26/2017, week 1 of carboplatin and paclitaxel on 04/21/2017, week 2 on 04/28/2017 - Chemotherapy held for the last 2 weeks secondary to thrombocytopenia -Posttreatment completion CT scan of the chest, abdomen and pelvis dated 07/19/2017 showed nonspecific persistent moderate irregular circumferential wall thickening in the lower thoracic esophagus, decreased from prior scans.  He was evaluated by Dr. Servando Snare who did not think he was a candidate for surgery. -Core biopsy of the liver lesion shows metastatic adenocarcinoma of the liver consistent with esophageal adenocarcinoma. -I have recommended testing his tumor for HER-2, PDL 1 and MSI.  We will send for foundation 1 testing.  If he is MSI high, we can use Pembro in the second line. -I have recommended palliative chemotherapy with FOLFOX-based regimen.  If his HER-2 is positive, will consider adding Herceptin.  He had difficulty tolerating weekly carbotaxol during radiation.  Hence I will start him at  a lower dose of FOLFOX during the first cycle and titrated up as he tolerates it well.  We talked about the side effects including neuropathy in detail.  He already has a port in place.  Tentatively we will plan to start him next Wednesday.  Dose of Decadron, if applicable:   Recent neurologic symptoms, if any:   Seizures: No  Headaches: No  Nausea: No  Dizziness/ataxia: Dizzy, off balance.   Difficulty with hand coordination: No  Focal numbness/weakness:  Some leg weakness- following his first chemo treatments.  Visual deficits/changes: No  Confusion/Memory deficits: Yes, short term memory loss.   BP 111/72 (BP Location: Left Arm, Patient Position: Sitting)   Pulse 81   Temp 97.7 F (36.5 C) (Oral)   Resp 18   Ht 5' 4"  (1.626 m)   Wt 187 lb 6 oz (85 kg)   SpO2 99%   BMI 32.16 kg/m    Wt Readings from Last 3 Encounters:  12/07/17 187 lb 6 oz (85 kg)  11/24/17 188 lb (85.3 kg)  11/17/17 189 lb 6.4 oz (85.9 kg)   SAFETY ISSUES:  Prior radiation? Yes, Esophagus 50 Gy in 25 fractions, 6 Gy boost in 3 fractions IMRT: 04/19/17-05/26/17  Pacemaker/ICD? No  Possible current pregnancy? No  Is the patient on methotrexate? No  Additional Complaints / other details:

## 2017-12-07 ENCOUNTER — Ambulatory Visit
Admission: RE | Admit: 2017-12-07 | Discharge: 2017-12-07 | Disposition: A | Discharge status: Home / Self Care | Payer: Medicare Other | Source: Ambulatory Visit | Attending: Radiation Oncology | Admitting: Radiation Oncology

## 2017-12-07 ENCOUNTER — Encounter: Payer: Self-pay | Admitting: Radiation Oncology

## 2017-12-07 ENCOUNTER — Other Ambulatory Visit: Payer: Self-pay

## 2017-12-07 VITALS — BP 111/72 | HR 81 | Temp 97.7°F | Resp 18 | Ht 64.0 in | Wt 187.4 lb

## 2017-12-07 DIAGNOSIS — Z7984 Long term (current) use of oral hypoglycemic drugs: Secondary | ICD-10-CM | POA: Insufficient documentation

## 2017-12-07 DIAGNOSIS — E1122 Type 2 diabetes mellitus with diabetic chronic kidney disease: Secondary | ICD-10-CM | POA: Diagnosis not present

## 2017-12-07 DIAGNOSIS — I110 Hypertensive heart disease with heart failure: Secondary | ICD-10-CM | POA: Insufficient documentation

## 2017-12-07 DIAGNOSIS — I252 Old myocardial infarction: Secondary | ICD-10-CM | POA: Diagnosis not present

## 2017-12-07 DIAGNOSIS — C160 Malignant neoplasm of cardia: Secondary | ICD-10-CM | POA: Diagnosis not present

## 2017-12-07 DIAGNOSIS — E1151 Type 2 diabetes mellitus with diabetic peripheral angiopathy without gangrene: Secondary | ICD-10-CM | POA: Diagnosis not present

## 2017-12-07 DIAGNOSIS — Z885 Allergy status to narcotic agent status: Secondary | ICD-10-CM | POA: Insufficient documentation

## 2017-12-07 DIAGNOSIS — F319 Bipolar disorder, unspecified: Secondary | ICD-10-CM | POA: Diagnosis not present

## 2017-12-07 DIAGNOSIS — C7931 Secondary malignant neoplasm of brain: Secondary | ICD-10-CM

## 2017-12-07 DIAGNOSIS — I2510 Atherosclerotic heart disease of native coronary artery without angina pectoris: Secondary | ICD-10-CM | POA: Diagnosis not present

## 2017-12-07 DIAGNOSIS — C155 Malignant neoplasm of lower third of esophagus: Secondary | ICD-10-CM | POA: Insufficient documentation

## 2017-12-07 DIAGNOSIS — M1990 Unspecified osteoarthritis, unspecified site: Secondary | ICD-10-CM | POA: Insufficient documentation

## 2017-12-07 DIAGNOSIS — C787 Secondary malignant neoplasm of liver and intrahepatic bile duct: Secondary | ICD-10-CM | POA: Diagnosis not present

## 2017-12-07 DIAGNOSIS — Z79899 Other long term (current) drug therapy: Secondary | ICD-10-CM | POA: Diagnosis not present

## 2017-12-07 DIAGNOSIS — E119 Type 2 diabetes mellitus without complications: Secondary | ICD-10-CM | POA: Insufficient documentation

## 2017-12-07 DIAGNOSIS — I5042 Chronic combined systolic (congestive) and diastolic (congestive) heart failure: Secondary | ICD-10-CM | POA: Insufficient documentation

## 2017-12-07 DIAGNOSIS — Z7952 Long term (current) use of systemic steroids: Secondary | ICD-10-CM | POA: Diagnosis not present

## 2017-12-07 DIAGNOSIS — C159 Malignant neoplasm of esophagus, unspecified: Secondary | ICD-10-CM | POA: Diagnosis not present

## 2017-12-07 DIAGNOSIS — F250 Schizoaffective disorder, bipolar type: Secondary | ICD-10-CM

## 2017-12-07 HISTORY — DX: Malignant neoplasm of liver, not specified as primary or secondary: C22.9

## 2017-12-07 HISTORY — DX: Malignant neoplasm of brain, unspecified: C71.9

## 2017-12-08 ENCOUNTER — Inpatient Hospital Stay (HOSPITAL_COMMUNITY): Payer: Medicare Other

## 2017-12-08 ENCOUNTER — Inpatient Hospital Stay (HOSPITAL_BASED_OUTPATIENT_CLINIC_OR_DEPARTMENT_OTHER): Payer: Medicare Other | Admitting: Internal Medicine

## 2017-12-08 ENCOUNTER — Encounter (HOSPITAL_COMMUNITY): Payer: Self-pay | Admitting: Internal Medicine

## 2017-12-08 VITALS — BP 120/76 | HR 100 | Temp 97.5°F | Resp 18

## 2017-12-08 VITALS — BP 121/78 | HR 98 | Temp 97.4°F | Resp 18 | Wt 192.3 lb

## 2017-12-08 DIAGNOSIS — N183 Chronic kidney disease, stage 3 (moderate): Secondary | ICD-10-CM

## 2017-12-08 DIAGNOSIS — D696 Thrombocytopenia, unspecified: Secondary | ICD-10-CM | POA: Diagnosis not present

## 2017-12-08 DIAGNOSIS — M129 Arthropathy, unspecified: Secondary | ICD-10-CM | POA: Diagnosis not present

## 2017-12-08 DIAGNOSIS — C158 Malignant neoplasm of overlapping sites of esophagus: Secondary | ICD-10-CM

## 2017-12-08 DIAGNOSIS — C159 Malignant neoplasm of esophagus, unspecified: Secondary | ICD-10-CM

## 2017-12-08 DIAGNOSIS — F250 Schizoaffective disorder, bipolar type: Secondary | ICD-10-CM

## 2017-12-08 DIAGNOSIS — C787 Secondary malignant neoplasm of liver and intrahepatic bile duct: Secondary | ICD-10-CM

## 2017-12-08 DIAGNOSIS — I130 Hypertensive heart and chronic kidney disease with heart failure and stage 1 through stage 4 chronic kidney disease, or unspecified chronic kidney disease: Secondary | ICD-10-CM

## 2017-12-08 DIAGNOSIS — G4733 Obstructive sleep apnea (adult) (pediatric): Secondary | ICD-10-CM

## 2017-12-08 DIAGNOSIS — E1165 Type 2 diabetes mellitus with hyperglycemia: Secondary | ICD-10-CM

## 2017-12-08 DIAGNOSIS — I252 Old myocardial infarction: Secondary | ICD-10-CM

## 2017-12-08 DIAGNOSIS — R911 Solitary pulmonary nodule: Secondary | ICD-10-CM | POA: Diagnosis not present

## 2017-12-08 DIAGNOSIS — Z5111 Encounter for antineoplastic chemotherapy: Secondary | ICD-10-CM | POA: Diagnosis not present

## 2017-12-08 DIAGNOSIS — R1310 Dysphagia, unspecified: Secondary | ICD-10-CM | POA: Diagnosis not present

## 2017-12-08 DIAGNOSIS — R5383 Other fatigue: Secondary | ICD-10-CM | POA: Diagnosis not present

## 2017-12-08 DIAGNOSIS — I700 Atherosclerosis of aorta: Secondary | ICD-10-CM | POA: Diagnosis not present

## 2017-12-08 DIAGNOSIS — Z7984 Long term (current) use of oral hypoglycemic drugs: Secondary | ICD-10-CM

## 2017-12-08 DIAGNOSIS — Z79899 Other long term (current) drug therapy: Secondary | ICD-10-CM

## 2017-12-08 DIAGNOSIS — Z803 Family history of malignant neoplasm of breast: Secondary | ICD-10-CM

## 2017-12-08 DIAGNOSIS — Z87442 Personal history of urinary calculi: Secondary | ICD-10-CM

## 2017-12-08 DIAGNOSIS — Z7982 Long term (current) use of aspirin: Secondary | ICD-10-CM

## 2017-12-08 DIAGNOSIS — I2510 Atherosclerotic heart disease of native coronary artery without angina pectoris: Secondary | ICD-10-CM | POA: Diagnosis not present

## 2017-12-08 DIAGNOSIS — I5042 Chronic combined systolic (congestive) and diastolic (congestive) heart failure: Secondary | ICD-10-CM

## 2017-12-08 DIAGNOSIS — R531 Weakness: Secondary | ICD-10-CM | POA: Diagnosis not present

## 2017-12-08 DIAGNOSIS — K219 Gastro-esophageal reflux disease without esophagitis: Secondary | ICD-10-CM

## 2017-12-08 DIAGNOSIS — Z923 Personal history of irradiation: Secondary | ICD-10-CM

## 2017-12-08 LAB — CBC WITH DIFFERENTIAL/PLATELET
ABS IMMATURE GRANULOCYTES: 0.03 10*3/uL (ref 0.00–0.07)
BASOS ABS: 0 10*3/uL (ref 0.0–0.1)
Basophils Relative: 0 %
EOS PCT: 0 %
Eosinophils Absolute: 0 10*3/uL (ref 0.0–0.5)
HEMATOCRIT: 45.2 % (ref 39.0–52.0)
HEMOGLOBIN: 14.8 g/dL (ref 13.0–17.0)
Immature Granulocytes: 0 %
LYMPHS ABS: 0.3 10*3/uL — AB (ref 0.7–4.0)
LYMPHS PCT: 3 %
MCH: 28.9 pg (ref 26.0–34.0)
MCHC: 32.7 g/dL (ref 30.0–36.0)
MCV: 88.3 fL (ref 80.0–100.0)
MONO ABS: 0.1 10*3/uL (ref 0.1–1.0)
Monocytes Relative: 1 %
Neutro Abs: 9.7 10*3/uL — ABNORMAL HIGH (ref 1.7–7.7)
Neutrophils Relative %: 96 %
Platelets: 104 10*3/uL — ABNORMAL LOW (ref 150–400)
RBC: 5.12 MIL/uL (ref 4.22–5.81)
RDW: 14.4 % (ref 11.5–15.5)
WBC: 10.2 10*3/uL (ref 4.0–10.5)
nRBC: 0 % (ref 0.0–0.2)

## 2017-12-08 LAB — COMPREHENSIVE METABOLIC PANEL
ALBUMIN: 3.6 g/dL (ref 3.5–5.0)
ALK PHOS: 172 U/L — AB (ref 38–126)
ALT: 27 U/L (ref 0–44)
ANION GAP: 9 (ref 5–15)
AST: 34 U/L (ref 15–41)
BILIRUBIN TOTAL: 0.8 mg/dL (ref 0.3–1.2)
BUN: 15 mg/dL (ref 8–23)
CALCIUM: 8.9 mg/dL (ref 8.9–10.3)
CO2: 22 mmol/L (ref 22–32)
Chloride: 102 mmol/L (ref 98–111)
Creatinine, Ser: 1.17 mg/dL (ref 0.61–1.24)
GFR calc Af Amer: >60 mL/min (ref >60)
GFR calc non Af Amer: >60 mL/min (ref >60)
GLUCOSE: 502 mg/dL — AB (ref 70–99)
POTASSIUM: 4.7 mmol/L (ref 3.5–5.1)
Sodium: 133 mmol/L — ABNORMAL LOW (ref 135–145)
TOTAL PROTEIN: 7 g/dL (ref 6.5–8.1)

## 2017-12-08 MED ORDER — OXALIPLATIN CHEMO INJECTION 100 MG/20ML
68.0000 mg/m2 | Freq: Once | INTRAVENOUS | Status: AC
  Administered 2017-12-08: 135 mg via INTRAVENOUS
  Filled 2017-12-08: qty 20

## 2017-12-08 MED ORDER — DEXTROSE 5 % IV SOLN
Freq: Once | INTRAVENOUS | Status: AC
  Administered 2017-12-08: 10:00:00 via INTRAVENOUS

## 2017-12-08 MED ORDER — SODIUM CHLORIDE 0.9 % IV SOLN
1920.0000 mg/m2 | INTRAVENOUS | Status: DC
  Administered 2017-12-08: 3800 mg via INTRAVENOUS
  Filled 2017-12-08: qty 76

## 2017-12-08 MED ORDER — SODIUM CHLORIDE 0.9 % IV SOLN
10.0000 mg | Freq: Once | INTRAVENOUS | Status: AC
  Administered 2017-12-08: 10 mg via INTRAVENOUS
  Filled 2017-12-08: qty 1

## 2017-12-08 MED ORDER — PALONOSETRON HCL INJECTION 0.25 MG/5ML
0.2500 mg | Freq: Once | INTRAVENOUS | Status: AC
  Administered 2017-12-08: 0.25 mg via INTRAVENOUS
  Filled 2017-12-08: qty 5

## 2017-12-08 MED ORDER — FLUOROURACIL CHEMO INJECTION 2.5 GM/50ML
320.0000 mg/m2 | Freq: Once | INTRAVENOUS | Status: AC
  Administered 2017-12-08: 650 mg via INTRAVENOUS
  Filled 2017-12-08: qty 13

## 2017-12-08 MED ORDER — LEUCOVORIN CALCIUM INJECTION 350 MG
600.0000 mg | Freq: Once | INTRAVENOUS | Status: AC
  Administered 2017-12-08: 600 mg via INTRAVENOUS
  Filled 2017-12-08: qty 30

## 2017-12-08 NOTE — Progress Notes (Signed)
Exam and discussion with Dr. Walden Field - she is aware of all pt's lab results, including glucose of 500.  Pt just took his Glipizide and Metformin morning doses in clinic. Okay to proceed with tx per MD.   Tolerated infusions w/o adverse reaction.  Alert, in no distress.  VSS.  Discharged via wheelchair in c/o sister.

## 2017-12-08 NOTE — Progress Notes (Signed)
Diagnosis No diagnosis found.  Staging Cancer Staging No matching staging information was found for the patient.  Assessment and Plan:  Malignant neoplasm of cardio-esophageal junction ([place]) 1.  Distal esophageal adenocarcinoma, stage II (UT3 UN 0):Pt followed by Dr. Worthy Keeler.   -7 cm long, bulky, focally circumferential, with nearby satellite lesion 2 cm distal to the GE junction -Radiation therapy started on 04/19/2017 through 05/26/2017, week 1 of carboplatin and paclitaxel on 04/21/2017, week 2 on 04/28/2017 - Chemotherapy held in the past due to  thrombocytopenia -He reports that his swallowing ability has improved since completing chemoradiation therapy.    CT scan of the chest, abdomen and pelvis dated 07/19/2017 which showed nonspecific persistent moderate irregular circumferential wall thickening in the lower thoracic esophagus, decreased from previous scans.  This could be post treatment changes versus residual tumor.  There is a new 4 mm left lower lobe pulmonary nodule, indeterminate.  No other evidence of metastatic disease.  He has been seen by Dr. Servando Snare.     CT CAP done 10/25/2017 showed  IMPRESSION: 1. Disease progression, as evidenced by development of bilateral hepatic metastasis. 2. Enlargement of a portal caval node, also suspicious for metastatic disease. 3. Similar distal esophageal wall thickening, without secondary signs of obstruction. 4. Paramediastinal pulmonary opacities are indeterminate, but new since the prior. Evolving radiation change could have this appearance. If no history of radiation therapy, considerations include infection or aspiration. 5. The previously described 4 mm left lower lobe pulmonary nodule is likely obscured by airspace disease in this region. 6. Coronary artery atherosclerosis. Aortic Atherosclerosis (ICD10-I70.0).  Pt underwent biopsy of the liver on 11/09/2017 with pathology returning as adenocarcinoma consistent with history of  esophageal cancer.    Pt is seen today for evaluation prior to C2 of Folfox.  Labs done 12/08/2017 reviewed and showed WBC 10.2 HB 14.8 plts 104,000.  Chemistries WNL with K+ 4.7 Cr 1.17 and normal LFTs.  Pt will proceed with Folfox today and will follow-up With Dr. Worthy Keeler in 2-4 weeks.   2.  Abnormal CT brain.  Pt had CT brain done 12/03/2017 that was reviewed and showed Impression: 1. Positive for a solitary enhancing mass in the right cerebellum compatible with Metastasis. Size estimated at 24-26 mm. Surrounding edema but no significant mass effect. 2. No other metastatic disease identified, but head MRI (without and with contrast) would be most sensitive and is recommended.  Pt has been seen by Dr. Lisbeth Renshaw of [facility].  He is set up for MRI of brain.  Pt is currently on Decadron.  Pt should follow-up with Dr. Lisbeth Renshaw as directed.    3.  Hyperglycemia.  Have contacted Dr. Dorris Fetch about consistently elevated blood sugars. BS 502 today.   Pt will be contacted for management by Dr. Liliane Channel office.    4. HTN.  BP is 121/78.  Follow-up with PCP.   25  minutes spent with more than 50% spent in counseling and coordination of care.    Current Status:  Pt is seen today for follow-up.  He is accompanied by caretaker and family member.  Pt had CT brain done recently by Dr. Luan Pulling.  He has been seen by Dr. Lisbeth Renshaw of [facility].  He is on Decadron.    Problem List Patient Active Problem List   Diagnosis Date Noted  . Uncontrolled type 2 diabetes mellitus with hyperglycemia ([place]) [E11.65] 05/27/2017  . Mixed hyperlipidemia [E78.2] 05/27/2017  . Decreased oral intake [R63.8] 04/22/2017  . Malignant neoplasm of esophagus ([place]) [  C15.9]   . Malignant neoplasm of cardio-esophageal junction ([place]) [C16.0] 03/30/2017  . Goals of care, counseling/discussion [Z71.89] 01/29/2017  . Dysphagia [R13.10] 01/29/2017  . GERD (gastroesophageal reflux disease) [K21.9] 01/29/2017  . Acute renal failure superimposed on stage 3  chronic kidney disease ([place]) [N17.9, N18.3] 08/18/2016  . CHF (congestive heart failure) ([place]) [I50.9] 08/16/2016  . Acute on chronic combined systolic and diastolic CHF (congestive heart failure) ([place]) [I50.43] 08/16/2016  . OSA (obstructive sleep apnea) [G47.33] 08/16/2016  . Acute renal insufficiency [N28.9] 08/16/2016  . Acute on chronic combined systolic (congestive) and diastolic (congestive) heart failure ([place]) [I50.43] 08/16/2016  . CAD (coronary artery disease) [I25.10] 08/16/2016  . Schizoaffective disorder, bipolar type ([place]) [F25.0] 04/25/2016  . Acute on chronic diastolic heart failure ([employer]) [I50.33] 03/07/2016  . Cellulitis and abscess of foot [L03.119, L02.619] 03/05/2016  . DM type 2 causing vascular disease ([place]) [E11.59] 03/05/2016  . Essential hypertension, benign [I10] 03/05/2016  . Involuntary commitment [Z04.6] 03/05/2016  . Cellulitis [L03.90] 03/05/2016    Past Medical History Past Medical History:  Diagnosis Date  . Arthritis   . Bipolar disorder ([place])   . Brain cancer ([place]) 2019  . CAD (coronary artery disease) 08/16/2016  . Cancer ([place])    esophageal with liver mets  . Cellulitis and abscess of foot 03/05/2016  . Chronic combined systolic and diastolic heart failure ([place])   . Diabetes mellitus without complication ([place])    boderline  . GERD (gastroesophageal reflux disease)   . Heart murmur 1995  . History of kidney stones   . Hypercholesteremia   . Hypertension   . Kidney stones   . Liver cancer ([place]) 2019  . Myocardial infarction ([place])   . OSA (obstructive sleep apnea)    no cpap, can't tolerate  . Schizoaffective disorder, bipolar type ([place]) 04/25/2016    Past Surgical History Past Surgical History:  Procedure Laterality Date  . APPENDECTOMY    . BIOPSY  03/09/2017   Procedure: BIOPSY;  Surgeon: Danie Binder, MD;  [facility];  Service: Endoscopy;;  gastric esophageal  . BIOPSY  11/23/2017   Procedure: BIOPSY;  Surgeon: Danie Binder, MD;  [facility];  Service: Endoscopy;;  esophagus  . CHOLECYSTECTOMY N/A 10/20/2013   Procedure: LAPAROSCOPIC CHOLECYSTECTOMY;  Surgeon: [name], MD;  Location: [facility];  Service: General;  Laterality: N/A;  . COLONOSCOPY WITH PROPOFOL N/A 03/09/2017   Procedure: COLONOSCOPY WITH PROPOFOL;  Surgeon: Danie Binder, MD;  [facility];  Service: Endoscopy;  Laterality: N/A;  12:15pm  . ESOPHAGOGASTRODUODENOSCOPY (EGD) WITH PROPOFOL N/A 03/09/2017   Procedure: ESOPHAGOGASTRODUODENOSCOPY (EGD) WITH PROPOFOL;  Surgeon: Danie Binder, MD;  [facility];  Service: Endoscopy;  Laterality: N/A;  . ESOPHAGOGASTRODUODENOSCOPY (EGD) WITH PROPOFOL N/A 11/23/2017   Procedure: ESOPHAGOGASTRODUODENOSCOPY (EGD) WITH PROPOFOL;  Surgeon: Danie Binder, MD;  [facility];  Service: Endoscopy;  Laterality: N/A;  10:00am  . EUS N/A 04/08/2017   Procedure: UPPER ENDOSCOPIC ULTRASOUND (EUS) RADIAL;  Surgeon: Milus Banister, MD;  Location: [facility];  Service: Endoscopy;  Laterality: N/A;  . HIP SURGERY Left   . KNEE SURGERY Left   . POLYPECTOMY  03/09/2017   Procedure: POLYPECTOMY;  Surgeon: Danie Binder, MD;  [facility];  Service: Endoscopy;;  colon   . PORTACATH PLACEMENT Left 04/07/2017   Procedure: INSERTION PORT-A-CATH LEFT SUBCLAVIAN;  Surgeon: Virl Cagey, MD;  Location: [facility];  Service:  General;  Laterality: Left;  . SAVORY DILATION N/A 03/09/2017   Procedure: SAVORY DILATION;  Surgeon: Danie Binder, MD;  [facility];  Service: Endoscopy;  Laterality: N/A;    Family History Family History  Problem Relation Age of Onset  . Hypertension Mother   . Dementia Mother   . Cancer Mother        breast  . Colon cancer Neg Hx   . Colon polyps Neg Hx      Social History  reports that he has never smoked. He has never used smokeless tobacco. He reports that he does not drink alcohol or use  drugs.  Medications  Current Outpatient Medications:  .  acetaminophen (TYLENOL) 325 MG tablet, Take 650 mg by mouth every 6 (six) hours as needed for moderate pain. , Disp: , Rfl:  .  AMITIZA 24 MCG capsule, Take 24 mcg by mouth 2 (two) times a week. , Disp: , Rfl:  .  Blood Glucose Monitoring Suppl (ONETOUCH VERIO) w/Device KIT, 1 each by Does not apply route as needed., Disp: 1 kit, Rfl: 0 .  carvedilol (COREG) 12.5 MG tablet, TAKE 1 TABLET(12.5 MG) BY MOUTH TWICE DAILY (Patient taking differently: Take 12.5 mg by mouth 2 (two) times daily with a meal. ), Disp: 180 tablet, Rfl: 3 .  dexamethasone (DECADRON) 4 MG tablet, Take 4 mg by mouth 2 (two) times daily., Disp: , Rfl:  .  ENTRESTO 49-51 MG, TAKE 1 TABLET BY MOUTH TWICE DAILY (Patient taking differently: Take 1 tablet by mouth 2 (two) times daily. ), Disp: 180 tablet, Rfl: 3 .  fluorouracil CALGB [address] in sodium chloride 0.9 % 150 mL, Inject into the vein continuous. Over 46 hours, Disp: , Rfl:  .  furosemide (LASIX) 40 MG tablet, Alternate 40 mg one day and 20 mg the next (Patient taking differently: Take 20-40 mg by mouth See admin instructions. Take 63m by mouth one day and alternate with 243mthe next day), Disp: 90 tablet, Rfl: 3 .  glipiZIDE (GLUCOTROL XL) 5 MG 24 hr tablet, Take 1 tablet (5 mg total) by mouth daily with breakfast., Disp: 30 tablet, Rfl: 2 .  glucose blood test strip, 1 each by Other route 4 (four) times daily. Use as instructed qid One Touch ultra, Disp: 150 each, Rfl: 5 .  lidocaine-prilocaine (EMLA) cream, Apply small amount over port site and cover with plastic wrap one hour prior to appointment., Disp: 30 g, Rfl: 0 .  linagliptin (TRADJENTA) 5 MG TABS tablet, Take 5 mg by mouth daily., Disp: , Rfl:  .  metFORMIN (GLUCOPHAGE) 500 MG tablet, TAKE 1 TABLET(500 MG) BY MOUTH TWICE DAILY WITH A MEAL (Patient taking differently: Take 500 mg by mouth 2 (two) times daily with a meal. ), Disp: 60 tablet, Rfl: 2 .  milk  thistle 175 MG tablet, Take 175 mg by mouth daily., Disp: , Rfl:  .  OLANZapine (ZYPREXA) 5 MG tablet, Take 5 mg by mouth 2 (two) times daily., Disp: , Rfl:  .  ondansetron (ZOFRAN ODT) 8 MG disintegrating tablet, Take 1 tablet (8 mg total) by mouth every 8 (eight) hours as needed for nausea or vomiting., Disp: 40 tablet, Rfl: 2 .  OXALIPLATIN IV, Inject into the vein., Disp: , Rfl:  .  pantoprazole (PROTONIX) 40 MG tablet, Take 1 tablet (40 mg total) by mouth 2 (two) times daily before a meal., Disp: 60 tablet, Rfl: 3 .  potassium chloride SA (K-DUR,KLOR-CON) 20 MEQ tablet,  Take 20 mEq by mouth daily., Disp: , Rfl:  .  prochlorperazine (COMPAZINE) 10 MG tablet, Take 1 tablet (10 mg total) by mouth every 6 (six) hours as needed for nausea or vomiting., Disp: 30 tablet, Rfl: 3 .  rosuvastatin (CRESTOR) 10 MG tablet, Take 1 tablet (10 mg total) by mouth daily., Disp: 90 tablet, Rfl: 3 No current facility-administered medications for this visit.   Facility-Administered Medications Ordered in Other Visits:  .  fluorouracil (ADRUCIL) 3,800 mg in sodium chloride 0.9 % 74 mL chemo infusion, 1,920 mg/m2 (Treatment Plan Recorded), Intravenous, 1 day or 1 dose, Kaetlyn Noa, MD .  fluorouracil (ADRUCIL) chemo injection 650 mg, 320 mg/m2 (Treatment Plan Recorded), Intravenous, Once, Rhiana Morash, MD .  leucovorin 600 mg in dextrose 5 % 250 mL infusion, 600 mg, Intravenous, Once, Maison Agrusa, Mathis Dad, MD .  oxaliplatin (ELOXATIN) 135 mg in dextrose 5 % 500 mL chemo infusion, 68 mg/m2 (Treatment Plan Recorded), Intravenous, Once, Ahlaya Ende, MD  Allergies Codeine and Morphine and related  Review of Systems Review of Systems - Oncology ROS negative other than recent fall   Physical Exam  Vitals Wt Readings from Last 3 Encounters:  12/08/17 192 lb 4.8 oz (87.2 kg)  12/07/17 187 lb 6 oz (85 kg)  11/24/17 188 lb (85.3 kg)   Temp Readings from Last 3 Encounters:  12/08/17 (!) 97.4 F (36.3 C) (Oral)   12/07/17 97.7 F (36.5 C) (Oral)  11/26/17 97.7 F (36.5 C) (Oral)   BP Readings from Last 3 Encounters:  12/08/17 121/78  12/07/17 111/72  11/26/17 129/73   Pulse Readings from Last 3 Encounters:  12/08/17 98  12/07/17 81  11/26/17 77   Constitutional: Well-developed, well-nourished, and in no distress.   HENT: Head: Normocephalic and atraumatic.  Mouth/Throat: No oropharyngeal exudate. Mucosa moist. Eyes: Pupils are equal, round, and reactive to light. Conjunctivae are normal. No scleral icterus.  Neck: Normal range of motion. Neck supple. No JVD present.  Cardiovascular: Normal rate, regular rhythm and normal heart sounds.  Exam reveals no gallop and no friction rub.   No murmur heard. Pulmonary/Chest: Effort normal and breath sounds normal. No respiratory distress. No wheezes.No rales.  Abdominal: Soft. Bowel sounds are normal. No distension. There is no tenderness. There is no guarding.  Musculoskeletal: No edema or tenderness.  Lymphadenopathy: No cervical, axillary or supraclavicular adenopathy.  Neurological: Alert and oriented to person, place, and time. No cranial nerve deficit.  Skin: Skin is warm and dry. No rash noted. No erythema. No pallor.  Psychiatric: Affect and judgment normal.   Labs Appointment on 12/08/2017  Component Date Value Ref Range Status  . WBC 12/08/2017 10.2  4.0 - 10.5 K/uL Final  . RBC 12/08/2017 5.12  4.22 - 5.81 MIL/uL Final  . Hemoglobin 12/08/2017 14.8  13.0 - 17.0 g/dL Final  . HCT 12/08/2017 45.2  39.0 - 52.0 % Final  . MCV 12/08/2017 88.3  80.0 - 100.0 fL Final  . MCH 12/08/2017 28.9  26.0 - 34.0 pg Final  . MCHC 12/08/2017 32.7  30.0 - 36.0 g/dL Final  . RDW 12/08/2017 14.4  11.5 - 15.5 % Final  . Platelets 12/08/2017 104* 150 - 400 K/uL Final   Comment: PLATELET COUNT CONFIRMED BY SMEAR SPECIMEN CHECKED FOR CLOTS Immature Platelet Fraction may be clinically indicated, consider ordering this additional test [id number]   . nRBC  12/08/2017 0.0  0.0 - 0.2 % Final  . Neutrophils Relative % 12/08/2017 96  % Final  .  Neutro Abs 12/08/2017 9.7* 1.7 - 7.7 K/uL Final  . Lymphocytes Relative 12/08/2017 3  % Final  . Lymphs Abs 12/08/2017 0.3* 0.7 - 4.0 K/uL Final  . Monocytes Relative 12/08/2017 1  % Final  . Monocytes Absolute 12/08/2017 0.1  0.1 - 1.0 K/uL Final  . Eosinophils Relative 12/08/2017 0  % Final  . Eosinophils Absolute 12/08/2017 0.0  0.0 - 0.5 K/uL Final  . Basophils Relative 12/08/2017 0  % Final  . Basophils Absolute 12/08/2017 0.0  0.0 - 0.1 K/uL Final  . Immature Granulocytes 12/08/2017 0  % Final  . Abs Immature Granulocytes 12/08/2017 0.03  0.00 - 0.07 K/uL Final   Performed at [facility], [address] [place], [place] [address]  . Sodium 12/08/2017 133* 135 - 145 mmol/L Final  . Potassium 12/08/2017 4.7  3.5 - 5.1 mmol/L Final  . Chloride 12/08/2017 102  98 - 111 mmol/L Final  . CO2 12/08/2017 22  22 - 32 mmol/L Final  . Glucose, Bld 12/08/2017 502* 70 - 99 mg/dL Final   Comment: CRITICAL RESULT CALLED TO, READ BACK BY AND VERIFIED WITH: PAGE,K AT 8:35AM ON 12/08/17 BY FESTERMAN,C   . BUN 12/08/2017 15  8 - 23 mg/dL Final  . Creatinine, Ser 12/08/2017 1.17  0.61 - 1.24 mg/dL Final  . Calcium 12/08/2017 8.9  8.9 - 10.3 mg/dL Final  . Total Protein 12/08/2017 7.0  6.5 - 8.1 g/dL Final  . Albumin 12/08/2017 3.6  3.5 - 5.0 g/dL Final  . AST 12/08/2017 34  15 - 41 U/L Final  . ALT 12/08/2017 27  0 - 44 U/L Final  . Alkaline Phosphatase 12/08/2017 172* 38 - 126 U/L Final  . Total Bilirubin 12/08/2017 0.8  0.3 - 1.2 mg/dL Final  . GFR calc non Af Amer 12/08/2017 >60  >60 mL/min Final  . GFR calc Af Amer 12/08/2017 >60  >60 mL/min Final   Comment: (NOTE) The eGFR has been calculated using the CKD EPI equation. This calculation has not been validated in all clinical situations. eGFR's persistently <60 mL/min signify possible Chronic Kidney Disease.   Georgiann Hahn gap 12/08/2017 9  5 - 15 Final    Performed at [facility], [address] [place], [place] [address]     Pathology No orders of the defined types were placed in this encounter.      Zoila Shutter MD

## 2017-12-08 NOTE — Progress Notes (Signed)
CRITICAL VALUE ALERT Critical value received:  Blood glucose 502 Date of notification:  12-08-2017 Time of notification: 0845 Critical value read back:  Yes.   Nurse who received alert:  C.Page RN MD notified (1st page):  Dr. Walden Field

## 2017-12-09 DIAGNOSIS — E1122 Type 2 diabetes mellitus with diabetic chronic kidney disease: Secondary | ICD-10-CM | POA: Diagnosis not present

## 2017-12-09 DIAGNOSIS — C159 Malignant neoplasm of esophagus, unspecified: Secondary | ICD-10-CM | POA: Diagnosis not present

## 2017-12-09 DIAGNOSIS — C787 Secondary malignant neoplasm of liver and intrahepatic bile duct: Secondary | ICD-10-CM | POA: Diagnosis not present

## 2017-12-09 DIAGNOSIS — I2510 Atherosclerotic heart disease of native coronary artery without angina pectoris: Secondary | ICD-10-CM | POA: Diagnosis not present

## 2017-12-09 DIAGNOSIS — F319 Bipolar disorder, unspecified: Secondary | ICD-10-CM | POA: Diagnosis not present

## 2017-12-09 DIAGNOSIS — E1151 Type 2 diabetes mellitus with diabetic peripheral angiopathy without gangrene: Secondary | ICD-10-CM | POA: Diagnosis not present

## 2017-12-10 ENCOUNTER — Other Ambulatory Visit: Payer: Self-pay | Admitting: Radiation Oncology

## 2017-12-10 ENCOUNTER — Inpatient Hospital Stay (HOSPITAL_COMMUNITY): Payer: Medicare Other

## 2017-12-10 ENCOUNTER — Other Ambulatory Visit: Payer: Self-pay

## 2017-12-10 ENCOUNTER — Other Ambulatory Visit: Payer: Self-pay | Admitting: Radiation Therapy

## 2017-12-10 ENCOUNTER — Encounter (HOSPITAL_COMMUNITY): Payer: Self-pay

## 2017-12-10 VITALS — BP 129/85 | HR 106 | Temp 97.6°F | Resp 18

## 2017-12-10 DIAGNOSIS — I700 Atherosclerosis of aorta: Secondary | ICD-10-CM | POA: Diagnosis not present

## 2017-12-10 DIAGNOSIS — C7931 Secondary malignant neoplasm of brain: Secondary | ICD-10-CM | POA: Insufficient documentation

## 2017-12-10 DIAGNOSIS — Z5111 Encounter for antineoplastic chemotherapy: Secondary | ICD-10-CM | POA: Diagnosis not present

## 2017-12-10 DIAGNOSIS — C787 Secondary malignant neoplasm of liver and intrahepatic bile duct: Secondary | ICD-10-CM | POA: Diagnosis not present

## 2017-12-10 DIAGNOSIS — C159 Malignant neoplasm of esophagus, unspecified: Secondary | ICD-10-CM | POA: Diagnosis not present

## 2017-12-10 DIAGNOSIS — D696 Thrombocytopenia, unspecified: Secondary | ICD-10-CM | POA: Diagnosis not present

## 2017-12-10 DIAGNOSIS — R911 Solitary pulmonary nodule: Secondary | ICD-10-CM | POA: Diagnosis not present

## 2017-12-10 DIAGNOSIS — C158 Malignant neoplasm of overlapping sites of esophagus: Secondary | ICD-10-CM

## 2017-12-10 MED ORDER — DEXAMETHASONE 4 MG PO TABS: 4.0000 mg | ORAL_TABLET | Freq: Two times a day (BID) | ORAL | 0 refills | Status: DC

## 2017-12-10 MED ORDER — GLIPIZIDE ER 10 MG PO TB24: 10.0000 mg | ORAL_TABLET | Freq: Every day | ORAL | 2 refills | Status: DC

## 2017-12-10 MED ORDER — HEPARIN SOD (PORK) LOCK FLUSH 100 UNIT/ML IV SOLN
500.0000 [IU] | Freq: Once | INTRAVENOUS | Status: AC | PRN
  Administered 2017-12-10: 500 [IU]

## 2017-12-10 MED ORDER — SODIUM CHLORIDE 0.9% FLUSH
10.0000 mL | INTRAVENOUS | Status: DC | PRN
  Administered 2017-12-10: 10 mL
  Filled 2017-12-10: qty 10

## 2017-12-10 NOTE — Progress Notes (Signed)
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   Interval Since Last Radiation:  6 months  04/19/2017 - 05/26/2017: The esophagus was initially treated to 50 Gy in 25 fractions and was followed by a 6 Gy boost in an additional 3 fractions to yield a total dose of 56 Gy.   Narrative: [name] is a very pleasant 67 year old gentleman who was originally diagnosed with at least T3N0 adenocarcinoma of the distal esophagus who had extension into the gastric cardia.  He underwent chemoradiation between March 4 and May 26, 2017.  He was in the process of getting back to medical oncology and to Dr. Servando Snare the last time I saw him.  Unfortunately in his surveillance imaging, he was recently found to have new disease in the liver. This was biopsied on 11/09/2017 and consistent with his esophageal adenocarcinoma.  The plan is for him to begin FOLFOX, however he presented on 12/03/2017 with symptoms of dizziness, head CT revealed a right cerebellar lesion measuring 24 to 26 mm with some right mild right-sided cerebellar edema.  He was started on dexamethasone at 4 mg twice daily.  He is met with Dr. Delton Coombes and comes today to discuss options of radiotherapy.                 On review of systems, the patient reports that he is doing well overall.  He is feeling better however since having his dexamethasone this is significantly reduced his dizziness. He denies any chest pain, shortness of breath, cough, fevers, chills, night sweats, unintended weight changes. He denies any bowel or bladder disturbances, and denies abdominal pain, nausea or vomiting. He denies any new  musculoskeletal or joint aches or pains, new skin lesions or concerns. A complete review of systems is obtained and is otherwise negative. . Past Medical History:  Past Medical History:  Diagnosis Date  . Arthritis   . Bipolar disorder ([place])   . Brain cancer ([place]) 2019  . CAD (coronary artery disease) 08/16/2016  . Cancer ([place])    esophageal with liver mets  . Cellulitis and abscess of foot 03/05/2016  . Chronic combined systolic and diastolic heart failure ([place])   . Diabetes mellitus without complication ([place])    boderline  . GERD (gastroesophageal reflux disease)   . Heart murmur 1995  . History of kidney stones   . Hypercholesteremia   . Hypertension   . Kidney stones   . Liver cancer ([place]) 2019  . Myocardial infarction ([place])   . OSA (obstructive sleep apnea)    no cpap, can't tolerate  . Schizoaffective disorder, bipolar type ([place]) 04/25/2016    Past Surgical History: Past Surgical History:  Procedure Laterality Date  . APPENDECTOMY    . BIOPSY  03/09/2017   Procedure: BIOPSY;  Surgeon: Danie Binder, MD;  [facility];  Service: Endoscopy;;  gastric esophageal  . BIOPSY  11/23/2017   Procedure: BIOPSY;  Surgeon: Danie Binder, MD;  [facility];  Service: Endoscopy;;  esophagus  . CHOLECYSTECTOMY N/A 10/20/2013   Procedure: LAPAROSCOPIC CHOLECYSTECTOMY;  Surgeon: [name], MD;  Location: [facility];  Service: General;  Laterality: N/A;  .  COLONOSCOPY WITH PROPOFOL N/A 03/09/2017   Procedure: COLONOSCOPY WITH PROPOFOL;  Surgeon: Danie Binder, MD;  [facility];  Service: Endoscopy;  Laterality: N/A;  12:15pm  . ESOPHAGOGASTRODUODENOSCOPY (EGD) WITH PROPOFOL N/A 03/09/2017   Procedure: ESOPHAGOGASTRODUODENOSCOPY (EGD) WITH PROPOFOL;  Surgeon: Danie Binder, MD;  [facility];  Service: Endoscopy;  Laterality: N/A;  . ESOPHAGOGASTRODUODENOSCOPY (EGD) WITH PROPOFOL N/A 11/23/2017   Procedure: ESOPHAGOGASTRODUODENOSCOPY (EGD)  WITH PROPOFOL;  Surgeon: Danie Binder, MD;  [facility];  Service: Endoscopy;  Laterality: N/A;  10:00am  . EUS N/A 04/08/2017   Procedure: UPPER ENDOSCOPIC ULTRASOUND (EUS) RADIAL;  Surgeon: Milus Banister, MD;  Location: [facility];  Service: Endoscopy;  Laterality: N/A;  . HIP SURGERY Left   . KNEE SURGERY Left   . POLYPECTOMY  03/09/2017   Procedure: POLYPECTOMY;  Surgeon: Danie Binder, MD;  [facility];  Service: Endoscopy;;  colon   . PORTACATH PLACEMENT Left 04/07/2017   Procedure: INSERTION PORT-A-CATH LEFT SUBCLAVIAN;  Surgeon: Virl Cagey, MD;  Location: [facility];  Service: General;  Laterality: Left;  . SAVORY DILATION N/A 03/09/2017   Procedure: SAVORY DILATION;  Surgeon: Danie Binder, MD;  [facility];  Service: Endoscopy;  Laterality: N/A;    Social History:  Social History   Socioeconomic History  . Marital status: Divorced    Spouse name: Not on file  . Number of children: Not on file  . Years of education: Not on file  . Highest education level: Not on file  Occupational History  . Not on file  Social Needs  . Financial resource strain: Not on file  . Food insecurity:    Worry: Not on file    Inability: Not on file  . Transportation needs:    Medical: No    Non-medical: No  Tobacco Use  . Smoking status: Never Smoker  . Smokeless tobacco: Never Used  Substance and Sexual Activity  . Alcohol use: No  . Drug use: No  . Sexual activity: Not Currently    Birth control/protection: Implant  Lifestyle  . Physical activity:    Days per week: Not on file    Minutes per session: Not on file  . Stress: Not on file  Relationships  . Social connections:    Talks on phone: Not on file    Gets together: Not on file    Attends religious service: Not on file    Active member of club or organization: Not on file    Attends meetings of clubs or organizations: Not on file    Relationship status: Not on file  .  Intimate partner violence:    Fear of current or ex partner: Not on file    Emotionally abused: Not on file    Physically abused: Not on file    Forced sexual activity: Not on file  Other Topics Concern  . Not on file  Social History Narrative  . Not on file  Patient is single.  His cousin is his primary health provider and his healthcare power of attorney.  He still lives alone, but requires much assistance in managing his medication.  Family History: Family History  Problem Relation Age of Onset  . Hypertension Mother   . Dementia Mother   . Cancer Mother        breast  . Colon cancer Neg Hx   . Colon polyps Neg Hx     ALLERGIES:  is allergic to codeine and morphine and related.  Meds: Current Outpatient Medications  Medication Sig Dispense Refill  . acetaminophen (TYLENOL) 325 MG tablet Take 650 mg by mouth every 6 (six) hours as needed for moderate pain.     Marland Kitchen AMITIZA 24 MCG capsule Take 24 mcg by mouth 2 (two) times a week.     . Blood Glucose Monitoring Suppl (ONETOUCH VERIO) w/Device KIT 1 each by Does not apply route as needed. 1 kit 0  . carvedilol (COREG) 12.5 MG tablet TAKE 1 TABLET(12.5 MG) BY MOUTH TWICE DAILY (Patient taking differently: Take 12.5 mg by mouth 2 (two) times daily with a meal. ) 180 tablet 3  . ENTRESTO 49-51 MG TAKE 1 TABLET BY MOUTH TWICE DAILY (Patient taking differently: Take 1 tablet by mouth 2 (two) times daily. ) 180 tablet 3  . fluorouracil CALGB 40102 in sodium chloride 0.9 % 150 mL Inject into the vein continuous. Over 46 hours    . furosemide (LASIX) 40 MG tablet Alternate 40 mg one day and 20 mg the next (Patient taking differently: Take 20-40 mg by mouth See admin instructions. Take 23m by mouth one day and alternate with 278mthe next day) 90 tablet 3  . glipiZIDE (GLUCOTROL XL) 5 MG 24 hr tablet Take 1 tablet (5 mg total) by mouth daily with breakfast. 30 tablet 2  . glucose blood test strip 1 each by Other route 4 (four) times daily.  Use as instructed qid One Touch ultra 150 each 5  . lidocaine-prilocaine (EMLA) cream Apply small amount over port site and cover with plastic wrap one hour prior to appointment. 30 g 0  . linagliptin (TRADJENTA) 5 MG TABS tablet Take 5 mg by mouth daily.    . metFORMIN (GLUCOPHAGE) 500 MG tablet TAKE 1 TABLET(500 MG) BY MOUTH TWICE DAILY WITH A MEAL (Patient taking differently: Take 500 mg by mouth 2 (two) times daily with a meal. ) 60 tablet 2  . milk thistle 175 MG tablet Take 175 mg by mouth daily.    . Marland KitchenLANZapine (ZYPREXA) 5 MG tablet Take 5 mg by mouth 2 (two) times daily.    . ondansetron (ZOFRAN ODT) 8 MG disintegrating tablet Take 1 tablet (8 mg total) by mouth every 8 (eight) hours as needed for nausea or vomiting. 40 tablet 2  . OXALIPLATIN IV Inject into the vein.    . pantoprazole (PROTONIX) 40 MG tablet Take 1 tablet (40 mg total) by mouth 2 (two) times daily before a meal. 60 tablet 3  . potassium chloride SA (K-DUR,KLOR-CON) 20 MEQ tablet Take 20 mEq by mouth daily.    . prochlorperazine (COMPAZINE) 10 MG tablet Take 1 tablet (10 mg total) by mouth every 6 (six) hours as needed for nausea or vomiting. 30 tablet 3  . rosuvastatin (CRESTOR) 10 MG tablet Take 1 tablet (10 mg total) by mouth daily. 90 tablet 3  . dexamethasone (DECADRON) 4 MG tablet Take 4 mg by mouth 2 (two) times daily.     No current facility-administered medications for this encounter.     Physical Findings:  height is _0  (1.626 m) and weight is 187 lb 6 oz (85 kg). His oral temperature is 97.7 F (36.5 C). His blood pressure is 111/72 and his pulse is 81. His respiration is 18 and oxygen saturation is 99%.  Pain Assessment Pain Score: 0-No pain/10 In general this is a well appearing caucasian male in no acute distress. He's alert and oriented  x4 and appropriate throughout the examination. Cardiopulmonary assessment is negative for acute distress and he exhibits normal effort.  He appears to be neurologically  intact.  Cerebellar function is preserved on neuro testing.  Lab Findings: Lab Results  Component Value Date   WBC 10.2 12/08/2017   HGB 14.8 12/08/2017   HCT 45.2 12/08/2017   MCV 88.3 12/08/2017   PLT 104 (L) 12/08/2017     Radiographic Findings: Ct Head W & Wo Contrast  Result Date: 12/03/2017 CLINICAL DATA:  67 year old male with esophageal cancer. EXAM: CT HEAD WITHOUT AND WITH CONTRAST TECHNIQUE: Contiguous axial images were obtained from the base of the skull through the vertex without and with intravenous contrast CONTRAST:  62m OMNIPAQUE IOHEXOL 300 MG/ML  SOLN COMPARISON:  PET-CT 03/24/2017.  Noncontrast head CT 03/05/2016. FINDINGS: Brain: Heterogeneously enhancing mass in the right lower cerebellum estimated at 24-26 millimeters diameter (series 3, image 6) is associated with mild regional cerebellar edema (series 2, image 6). There is no significant mass effect. No other intracranial mass or abnormal enhancement. Patchy bilateral cerebral white matter hypodensity appears progressed since 2018. A possible small area of left inferior frontal gyrus encephalomalacia on series 2, image 13 is stable. No midline shift or ventriculomegaly. No intracranial hemorrhage or evidence of cortically based acute infarction. Vascular: Calcified atherosclerosis at the skull base. The major intracranial vascular structures are enhancing and appear patent. Skull: Stable and negative. Sinuses/Orbits: Bubbly opacity in the right sphenoid sinus but otherwise stable and well pneumatized. Tympanic cavities and mastoids remain clear. Other: Visualized orbit soft tissues are within normal limits. Visualized scalp soft tissues are within normal limits. IMPRESSION: 1. Positive for a solitary enhancing mass in the right cerebellum compatible with Metastasis. Size estimated at 24-26 mm. Surrounding edema but no significant mass effect. 2. No other metastatic disease identified, but head MRI (without and with  contrast) would be most sensitive and is recommended. These results will be called to the ordering clinician or representative by the Radiologist Assistant, and communication documented in the PACS or zVision Dashboard. Electronically Signed   By: HGenevie AnnM.D.   On: 12/03/2017 09:16    Impression/Plan: 1. 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  Dr. MLisbeth Renshawreviews the patient's course since his last interaction with the patient, especially outlining the findings in the liver and brain.  He recommends proceeding with an MRI scan.  We will attempt to squat coordinate a 3T scan as he may be a candidate for stereotactic radiosurgery.  We will work with our brain navigator to try and coordinate this.  The patient is in agreement.  In the meantime he will continue his dexamethasone 4 mg twice daily.  A refill of this will be called into [facility] on [address] [place]  We also discussed the importance of GI prophylaxis and to take over-the-counter Nexium or Prilosec.  He is in agreement with this plan.  We will contact him with the results of his MRI scan, he will also proceed with systemic therapy with medical oncology [place]   In a visit lasting 45 minutes, greater than 50% of the time was spent face to face discussing his case, and coordinating the patient's care.  The above documentation reflects my direct findings during this shared patient visit. Please see the separate note by Dr. MLisbeth Renshawon this date for the remainder of the patient's plan of care.  Carola Rhine, PAC

## 2017-12-10 NOTE — Progress Notes (Signed)
[name] presents to have home infusion pump d/c'd and for port-a-cath deaccess with flush.  Portacath located left chest wall accessed with  H 20 needle.  Good blood return present. Portacath flushed with NS and 500U/20ml Heparin, and needle removed intact.  Procedure tolerated well and without incident.  Discharged via wheelchair in c/o sister.

## 2017-12-10 NOTE — Telephone Encounter (Signed)
Per dr Dorris Fetch change Glipizide to 10 mg qd due to elevated BG levels of up to 500 and start testing before each meal and at bedtime. Pts daughter notified

## 2017-12-10 NOTE — Progress Notes (Signed)
MRI

## 2017-12-13 DIAGNOSIS — I2510 Atherosclerotic heart disease of native coronary artery without angina pectoris: Secondary | ICD-10-CM | POA: Diagnosis not present

## 2017-12-13 DIAGNOSIS — C159 Malignant neoplasm of esophagus, unspecified: Secondary | ICD-10-CM | POA: Diagnosis not present

## 2017-12-13 DIAGNOSIS — C787 Secondary malignant neoplasm of liver and intrahepatic bile duct: Secondary | ICD-10-CM | POA: Diagnosis not present

## 2017-12-13 DIAGNOSIS — E1151 Type 2 diabetes mellitus with diabetic peripheral angiopathy without gangrene: Secondary | ICD-10-CM | POA: Diagnosis not present

## 2017-12-13 DIAGNOSIS — F319 Bipolar disorder, unspecified: Secondary | ICD-10-CM | POA: Diagnosis not present

## 2017-12-13 DIAGNOSIS — E1122 Type 2 diabetes mellitus with diabetic chronic kidney disease: Secondary | ICD-10-CM | POA: Diagnosis not present

## 2017-12-14 DIAGNOSIS — C159 Malignant neoplasm of esophagus, unspecified: Secondary | ICD-10-CM | POA: Diagnosis not present

## 2017-12-14 DIAGNOSIS — F319 Bipolar disorder, unspecified: Secondary | ICD-10-CM | POA: Diagnosis not present

## 2017-12-14 DIAGNOSIS — I2510 Atherosclerotic heart disease of native coronary artery without angina pectoris: Secondary | ICD-10-CM | POA: Diagnosis not present

## 2017-12-14 DIAGNOSIS — C787 Secondary malignant neoplasm of liver and intrahepatic bile duct: Secondary | ICD-10-CM | POA: Diagnosis not present

## 2017-12-14 DIAGNOSIS — E1151 Type 2 diabetes mellitus with diabetic peripheral angiopathy without gangrene: Secondary | ICD-10-CM | POA: Diagnosis not present

## 2017-12-14 DIAGNOSIS — E1122 Type 2 diabetes mellitus with diabetic chronic kidney disease: Secondary | ICD-10-CM | POA: Diagnosis not present

## 2017-12-15 DIAGNOSIS — C159 Malignant neoplasm of esophagus, unspecified: Secondary | ICD-10-CM | POA: Diagnosis not present

## 2017-12-15 DIAGNOSIS — E1151 Type 2 diabetes mellitus with diabetic peripheral angiopathy without gangrene: Secondary | ICD-10-CM | POA: Diagnosis not present

## 2017-12-15 DIAGNOSIS — F319 Bipolar disorder, unspecified: Secondary | ICD-10-CM | POA: Diagnosis not present

## 2017-12-15 DIAGNOSIS — E1122 Type 2 diabetes mellitus with diabetic chronic kidney disease: Secondary | ICD-10-CM | POA: Diagnosis not present

## 2017-12-15 DIAGNOSIS — I2510 Atherosclerotic heart disease of native coronary artery without angina pectoris: Secondary | ICD-10-CM | POA: Diagnosis not present

## 2017-12-15 DIAGNOSIS — C787 Secondary malignant neoplasm of liver and intrahepatic bile duct: Secondary | ICD-10-CM | POA: Diagnosis not present

## 2017-12-16 ENCOUNTER — Other Ambulatory Visit: Payer: Self-pay | Admitting: Radiation Therapy

## 2017-12-17 ENCOUNTER — Other Ambulatory Visit: Payer: Self-pay | Admitting: Adult Health

## 2017-12-18 ENCOUNTER — Emergency Department (HOSPITAL_COMMUNITY)
Admission: EM | Admit: 2017-12-18 | Discharge: 2017-12-19 | Disposition: A | Discharge status: Home / Self Care | Payer: Medicare Other | Attending: Emergency Medicine | Admitting: Emergency Medicine

## 2017-12-18 ENCOUNTER — Other Ambulatory Visit: Payer: Self-pay

## 2017-12-18 ENCOUNTER — Emergency Department (HOSPITAL_COMMUNITY): Payer: Medicare Other

## 2017-12-18 ENCOUNTER — Ambulatory Visit
Admission: RE | Admit: 2017-12-18 | Discharge: 2017-12-18 | Disposition: A | Discharge status: Home / Self Care | Payer: Medicare Other | Source: Ambulatory Visit | Attending: Radiation Oncology | Admitting: Radiation Oncology

## 2017-12-18 ENCOUNTER — Encounter (HOSPITAL_COMMUNITY): Payer: Self-pay

## 2017-12-18 DIAGNOSIS — C3490 Malignant neoplasm of unspecified part of unspecified bronchus or lung: Secondary | ICD-10-CM | POA: Diagnosis not present

## 2017-12-18 DIAGNOSIS — I252 Old myocardial infarction: Secondary | ICD-10-CM | POA: Diagnosis not present

## 2017-12-18 DIAGNOSIS — R531 Weakness: Secondary | ICD-10-CM | POA: Insufficient documentation

## 2017-12-18 DIAGNOSIS — I2510 Atherosclerotic heart disease of native coronary artery without angina pectoris: Secondary | ICD-10-CM | POA: Diagnosis not present

## 2017-12-18 DIAGNOSIS — N183 Chronic kidney disease, stage 3 (moderate): Secondary | ICD-10-CM | POA: Diagnosis not present

## 2017-12-18 DIAGNOSIS — C787 Secondary malignant neoplasm of liver and intrahepatic bile duct: Secondary | ICD-10-CM | POA: Insufficient documentation

## 2017-12-18 DIAGNOSIS — E1122 Type 2 diabetes mellitus with diabetic chronic kidney disease: Secondary | ICD-10-CM | POA: Insufficient documentation

## 2017-12-18 DIAGNOSIS — I5040 Unspecified combined systolic (congestive) and diastolic (congestive) heart failure: Secondary | ICD-10-CM | POA: Diagnosis not present

## 2017-12-18 DIAGNOSIS — C7931 Secondary malignant neoplasm of brain: Secondary | ICD-10-CM | POA: Diagnosis not present

## 2017-12-18 DIAGNOSIS — Z9049 Acquired absence of other specified parts of digestive tract: Secondary | ICD-10-CM | POA: Insufficient documentation

## 2017-12-18 DIAGNOSIS — Z8501 Personal history of malignant neoplasm of esophagus: Secondary | ICD-10-CM | POA: Diagnosis not present

## 2017-12-18 DIAGNOSIS — F319 Bipolar disorder, unspecified: Secondary | ICD-10-CM | POA: Insufficient documentation

## 2017-12-18 DIAGNOSIS — F259 Schizoaffective disorder, unspecified: Secondary | ICD-10-CM | POA: Diagnosis not present

## 2017-12-18 DIAGNOSIS — I130 Hypertensive heart and chronic kidney disease with heart failure and stage 1 through stage 4 chronic kidney disease, or unspecified chronic kidney disease: Secondary | ICD-10-CM | POA: Insufficient documentation

## 2017-12-18 LAB — CBC WITH DIFFERENTIAL/PLATELET
ABS IMMATURE GRANULOCYTES: 0.13 10*3/uL — AB (ref 0.00–0.07)
BASOS ABS: 0 10*3/uL (ref 0.0–0.1)
Basophils Relative: 0 %
Eosinophils Absolute: 0 10*3/uL (ref 0.0–0.5)
Eosinophils Relative: 0 %
HCT: 45.9 % (ref 39.0–52.0)
HEMOGLOBIN: 15.4 g/dL (ref 13.0–17.0)
Immature Granulocytes: 1 %
LYMPHS ABS: 0.4 10*3/uL — AB (ref 0.7–4.0)
LYMPHS PCT: 4 %
MCH: 29.1 pg (ref 26.0–34.0)
MCHC: 33.6 g/dL (ref 30.0–36.0)
MCV: 86.8 fL (ref 80.0–100.0)
Monocytes Absolute: 0.9 10*3/uL (ref 0.1–1.0)
Monocytes Relative: 9 %
NEUTROS ABS: 8.9 10*3/uL — AB (ref 1.7–7.7)
NRBC: 0 % (ref 0.0–0.2)
Neutrophils Relative %: 86 %
Platelets: 78 10*3/uL — ABNORMAL LOW (ref 150–400)
RBC: 5.29 MIL/uL (ref 4.22–5.81)
RDW: 14.6 % (ref 11.5–15.5)
WBC: 10.4 10*3/uL (ref 4.0–10.5)

## 2017-12-18 LAB — URINALYSIS, ROUTINE W REFLEX MICROSCOPIC
BILIRUBIN URINE: NEGATIVE
Bacteria, UA: NONE SEEN
HGB URINE DIPSTICK: NEGATIVE
KETONES UR: 5 mg/dL — AB
LEUKOCYTES UA: NEGATIVE
NITRITE: NEGATIVE
PROTEIN: NEGATIVE mg/dL
Specific Gravity, Urine: 1.029 (ref 1.005–1.030)
pH: 5 (ref 5.0–8.0)

## 2017-12-18 LAB — TROPONIN I

## 2017-12-18 LAB — COMPREHENSIVE METABOLIC PANEL
ALT: 21 U/L (ref 0–44)
AST: 19 U/L (ref 15–41)
Albumin: 3.1 g/dL — ABNORMAL LOW (ref 3.5–5.0)
Alkaline Phosphatase: 104 U/L (ref 38–126)
Anion gap: 10 (ref 5–15)
BUN: 34 mg/dL — AB (ref 8–23)
CHLORIDE: 99 mmol/L (ref 98–111)
CO2: 24 mmol/L (ref 22–32)
Calcium: 8.7 mg/dL — ABNORMAL LOW (ref 8.9–10.3)
Creatinine, Ser: 1.01 mg/dL (ref 0.61–1.24)
Glucose, Bld: 478 mg/dL — ABNORMAL HIGH (ref 70–99)
POTASSIUM: 4.5 mmol/L (ref 3.5–5.1)
SODIUM: 133 mmol/L — AB (ref 135–145)
Total Bilirubin: 1 mg/dL (ref 0.3–1.2)
Total Protein: 5.7 g/dL — ABNORMAL LOW (ref 6.5–8.1)

## 2017-12-18 LAB — LIPASE, BLOOD: LIPASE: 25 U/L (ref 11–51)

## 2017-12-18 MED ORDER — HEPARIN SOD (PORK) LOCK FLUSH 100 UNIT/ML IV SOLN
INTRAVENOUS | Status: AC
  Administered 2017-12-18
  Filled 2017-12-18: qty 5

## 2017-12-18 MED ORDER — SODIUM CHLORIDE 0.9 % IV BOLUS
500.0000 mL | Freq: Once | INTRAVENOUS | Status: AC
  Administered 2017-12-18: 500 mL via INTRAVENOUS

## 2017-12-18 MED ORDER — GADOBENATE DIMEGLUMINE 529 MG/ML IV SOLN
19.0000 mL | Freq: Once | INTRAVENOUS | Status: AC | PRN
  Administered 2017-12-18: 19 mL via INTRAVENOUS

## 2017-12-18 NOTE — Discharge Instructions (Addendum)
You were seen in the ED today with generalized weakness. This is likely related to your ongoing cancer and the associated treatment. Your labs here show elevated blood sugar but similar to prior. No other findings today to explain symptoms. You can no longer drive a car as there is now a cancer lesion found in the brain and this can affect your coordination, reaction times, and concentration.   Return to the ED with any new or worsening symptoms.

## 2017-12-18 NOTE — ED Notes (Signed)
ED Provider at bedside. 

## 2017-12-18 NOTE — ED Triage Notes (Signed)
Pt arrives from home via POV c/o dehydration and weakness. Pt has stage IV liver cancer, esophageal cancer and brain cancer. Pt was seen at [facility] earlier for MRI of brain. Pt reports falling multiple times at home this week and denies N/V. Pt lives at home alone and is seeking Placement in SNF. Pt cousin present in room and is Pts POA.

## 2017-12-18 NOTE — ED Provider Notes (Signed)
Emergency Department Provider Note   I have reviewed the triage vital signs and the nursing notes.   HISTORY  Chief Complaint Weakness   HPI CEM [name] is a 67 y.o. 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  The patient denies any pain at this time.  The cousin at bedside, who is the patient's reported POA, states that the patient lives alone and continues to drive a car.  He has been more fatigued and weak especially over the past several weeks.  She checks on him frequently and he has received 2 cycles of chemotherapy.  He has had multiple falls which resulted in a CT scan of the head.  A cerebellar lesion was found and he was sent for outpatient MRI today.  She noticed significant fatigue so presented to the emergency department.  The patient does have home health and she is working with that agency to secure SNF placement from home.   Past Medical History:  Diagnosis Date  . Arthritis   . Bipolar disorder ([place])   . Brain cancer ([place]) 2019  . CAD (coronary artery disease) 08/16/2016  . Cancer ([place])    esophageal with liver mets  . Cellulitis and abscess of foot 03/05/2016  . Chronic combined systolic and diastolic heart failure ([place])   . Diabetes mellitus without complication ([place])    boderline  . GERD (gastroesophageal reflux disease)   . Heart murmur 1995  . History of kidney stones   . Hypercholesteremia   . Hypertension   . Kidney stones   . Liver cancer ([place]) 2019  . Myocardial infarction ([place])   . OSA (obstructive sleep apnea)    no cpap, can't tolerate  . Schizoaffective disorder, bipolar type ([place]) 04/25/2016    Patient Active Problem List   Diagnosis Date Noted  . Brain metastasis ([place]) 12/10/2017  . Uncontrolled type 2 diabetes mellitus with hyperglycemia ([place]) 05/27/2017  . Mixed hyperlipidemia 05/27/2017  .  Decreased oral intake 04/22/2017  . Malignant neoplasm of esophagus ([place])   . 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 Goals of care, counseling/discussion 01/29/2017  . Dysphagia 01/29/2017  . GERD (gastroesophageal reflux disease) 01/29/2017  . Acute renal failure superimposed on stage 3 chronic kidney disease ([place]) 08/18/2016  . CHF (congestive heart failure) ([place]) 08/16/2016  . Acute on chronic combined systolic and diastolic CHF (congestive heart failure) ([place]) 08/16/2016  . OSA (obstructive sleep apnea) 08/16/2016  . Acute renal insufficiency 08/16/2016  . Acute on chronic combined systolic (congestive) and diastolic (congestive) heart failure ([place]) 08/16/2016  . CAD (coronary artery disease) 08/16/2016  . Schizoaffective disorder, bipolar type ([place]) 04/25/2016  . Acute on chronic diastolic heart failure ([place]) 03/07/2016  . Cellulitis and abscess of foot 03/05/2016  . DM type 2 causing vascular disease ([place]) 03/05/2016  . Essential hypertension, benign 03/05/2016  . Involuntary commitment 03/05/2016  . Cellulitis 03/05/2016    Past Surgical History:  Procedure Laterality Date  . APPENDECTOMY    . BIOPSY  03/09/2017   Procedure: BIOPSY;  Surgeon: Danie Binder, MD;  [facility];  Service: Endoscopy;;  gastric esophageal  . BIOPSY  11/23/2017   Procedure: BIOPSY;  Surgeon: Danie Binder, MD;  [facility];  Service: Endoscopy;;  esophagus  . CHOLECYSTECTOMY N/A 10/20/2013   Procedure: LAPAROSCOPIC CHOLECYSTECTOMY;  Surgeon: [name], MD;  Location: [facility];  Service: General;  Laterality: N/A;  . COLONOSCOPY WITH PROPOFOL N/A 03/09/2017   Procedure: COLONOSCOPY WITH PROPOFOL;  Surgeon: Danie Binder, MD;  [facility];  Service: Endoscopy;  Laterality: N/A;  12:15pm  . ESOPHAGOGASTRODUODENOSCOPY (EGD) WITH PROPOFOL N/A 03/09/2017   Procedure: ESOPHAGOGASTRODUODENOSCOPY (EGD) WITH PROPOFOL;  Surgeon: Danie Binder, MD;  [facility];  Service: Endoscopy;  Laterality: N/A;  . ESOPHAGOGASTRODUODENOSCOPY (EGD) WITH PROPOFOL N/A 11/23/2017   Procedure: ESOPHAGOGASTRODUODENOSCOPY (EGD) WITH PROPOFOL;  Surgeon: Danie Binder, MD;  [facility];  Service: Endoscopy;  Laterality: N/A;  10:00am  . EUS N/A 04/08/2017   Procedure: UPPER ENDOSCOPIC ULTRASOUND (EUS) RADIAL;  Surgeon: Milus Banister, MD;  Location: [facility];  Service: Endoscopy;  Laterality: N/A;  . HIP SURGERY Left   . KNEE SURGERY Left   . POLYPECTOMY  03/09/2017   Procedure: POLYPECTOMY;  Surgeon: Danie Binder, MD;  [facility];  Service: Endoscopy;;  colon   . PORTACATH PLACEMENT Left 04/07/2017   Procedure: INSERTION PORT-A-CATH LEFT SUBCLAVIAN;  Surgeon: Virl Cagey, MD;  Location: [facility];  Service: General;  Laterality: Left;  . SAVORY DILATION N/A 03/09/2017   Procedure: SAVORY DILATION;  Surgeon: Danie Binder, MD;  [facility];  Service: Endoscopy;  Laterality: N/A;    Allergies Codeine and Morphine and related  Family History  Problem Relation Age of Onset  . Hypertension Mother   . Dementia Mother   . Cancer Mother        breast  . Colon cancer Neg Hx   . Colon polyps Neg Hx     Social History Social History   Tobacco Use  . Smoking status: Never Smoker  . Smokeless tobacco: Never Used  Substance Use Topics  . Alcohol use: No  . Drug use: No    Review of Systems  Constitutional: No fever/chills. Positive fatigue.  Eyes: No visual changes. ENT: No sore throat. Cardiovascular: Denies chest pain. Respiratory: Denies shortness of breath. Gastrointestinal: No abdominal pain.  No nausea, no vomiting.  No diarrhea.  No constipation. Genitourinary: Negative for dysuria. Musculoskeletal: Negative for back pain. Skin: Negative for rash. Neurological: Negative for headaches, focal weakness or numbness.  10-point ROS otherwise  negative.  ____________________________________________   PHYSICAL EXAM:  VITAL SIGNS: ED Triage Vitals  Enc Vitals Group     BP 12/18/17 2020 118/81     Pulse Rate 12/18/17 2020 98     Resp 12/18/17 2020 18     Temp 12/18/17 2020 98.6 F (37 C)     Temp Source 12/18/17 2020 Axillary     SpO2 12/18/17 2020 96 %     Pain Score 12/18/17 2021 0   Constitutional: Alert and oriented. Well appearing and in no acute distress. Eyes: Conjunctivae are normal.  Head: Atraumatic. Nose: No congestion/rhinnorhea. Mouth/Throat: Mucous membranes are moist.  Neck: No stridor.   Cardiovascular: Normal rate, regular rhythm. Good peripheral circulation. Grossly normal heart sounds.   Respiratory: Normal respiratory effort.  No retractions. Lungs CTAB. Gastrointestinal: Soft and nontender. No distention.  Musculoskeletal: No lower extremity tenderness nor edema. No gross deformities of extremities. Neurologic:  Normal speech and language. No gross focal neurologic deficits are appreciated.  Skin:  Skin is warm, dry and intact. No rash noted.  ____________________________________________   LABS (all labs ordered are listed, but only abnormal results are displayed)  Labs Reviewed  COMPREHENSIVE METABOLIC PANEL - Abnormal;  Notable for the following components:      Result Value   Sodium 133 (*)    Glucose, Bld 478 (*)    BUN 34 (*)    Calcium 8.7 (*)    Total Protein 5.7 (*)    Albumin 3.1 (*)    All other components within normal limits  CBC WITH DIFFERENTIAL/PLATELET - Abnormal; Notable for the following components:   Platelets 78 (*)    Neutro Abs 8.9 (*)    Lymphs Abs 0.4 (*)    Abs Immature Granulocytes 0.13 (*)    All other components within normal limits  URINALYSIS, ROUTINE W REFLEX MICROSCOPIC - Abnormal; Notable for the following components:   Glucose, UA >=500 (*)    Ketones, ur 5 (*)    All other components within normal limits  TROPONIN I  LIPASE, BLOOD    ____________________________________________  EKG   EKG Interpretation  Date/Time:  Saturday December 18 2017 20:55:46 EDT Ventricular Rate:  97 PR Interval:    QRS Duration: 98 QT Interval:  373 QTC Calculation: 474 R Axis:   -34 Text Interpretation:  Sinus rhythm Ventricular premature complex Left axis deviation Abnormal lateral Q waves Anterior infarct, old No STEMI.  Confirmed by Nanda Quinton [phone]) on 12/18/2017 9:11:34 PM       ____________________________________________  RADIOLOGY  Dg Chest 2 View  Result Date: 12/18/2017 CLINICAL DATA:  Dehydration and weakness.  Stage IV liver cancer. EXAM: CHEST - 2 VIEW COMPARISON:  10/25/2017. FINDINGS: Cardiac enlargement. Left chest wall port a catheter noted with tip at the cavoatrial junction. No pleural effusion. The lung volumes are low. No airspace opacities identified. IMPRESSION: 1. Low lung volumes. 2. Cardiac enlargement. Electronically Signed   By: Kerby Moors M.D.   On: 12/18/2017 22:00    ____________________________________________   PROCEDURES  Procedure(s) performed:   Procedures  None ____________________________________________   INITIAL IMPRESSION / ASSESSMENT AND PLAN / ED COURSE  Pertinent labs & imaging results that were available during my care of the patient were reviewed by me and considered in my medical decision making (see chart for details).  Patient with known metastatic cancer presents with generalized weakness.  She is currently undergoing chemotherapy.  No fevers or historical features to suspect underlying infection.  Plan for screening labs to assess patient's hydration status and electrolytes.  Patient had MRI brain with and without contrast today as an outpatient.  The radiologist interpretation is not complete as of yet.  Patient is awake and alert.  He is on Decadron and has plans for follow-up regarding the MRI results later this week.  Plan for IV fluids and reassess.   Labs  reviewed. Patient continues to have hyperglycemia but levels are near baseline for patient. According to family this has been a problem for several months and they are working with PCP to better control blood sugars. No DKA. No concern for Hyperosmolar state. Patient awake and alert here. Feeling better after IVF. CXR negative for infiltrate. Family continues to work on placement from home and are comfortable with returning home today.   I did specifically address the fact that the patient should not be driving with his new brain met and confusion. Family says they will take keys.   At this time, I do not feel there is any life-threatening condition present. I have reviewed and discussed all results (EKG, imaging, lab, urine as appropriate), exam findings with patient. I have reviewed nursing notes and appropriate previous records.  I feel  the patient is safe to be discharged home without further emergent workup. Discussed usual and customary return precautions. Patient and family (if present) verbalize understanding and are comfortable with this plan.  Patient will follow-up with their primary care provider. If they do not have a primary care provider, information for follow-up has been provided to them. All questions have been answered.  ____________________________________________  FINAL CLINICAL IMPRESSION(S) / ED DIAGNOSES  Final diagnoses:  Generalized weakness    MEDICATIONS GIVEN DURING THIS VISIT:  Medications  sodium chloride 0.9 % bolus 500 mL (0 mLs Intravenous Stopped 12/18/17 2206)  heparin lock flush 100 UNIT/ML injection (  Given 12/18/17 2354)     Note:  This document was prepared using Dragon voice recognition software and may include unintentional dictation errors.  Nanda Quinton, MD Emergency Medicine    Bonnell Placzek, [facility], MD 12/19/17 [phone]

## 2017-12-18 NOTE — ED Notes (Signed)
Patient transported to X-ray 

## 2017-12-20 ENCOUNTER — Inpatient Hospital Stay: Payer: Medicare Other | Attending: Radiation Oncology

## 2017-12-20 ENCOUNTER — Telehealth: Payer: Self-pay | Admitting: Radiation Oncology

## 2017-12-20 DIAGNOSIS — M129 Arthropathy, unspecified: Secondary | ICD-10-CM | POA: Insufficient documentation

## 2017-12-20 DIAGNOSIS — I252 Old myocardial infarction: Secondary | ICD-10-CM | POA: Insufficient documentation

## 2017-12-20 DIAGNOSIS — E860 Dehydration: Secondary | ICD-10-CM | POA: Insufficient documentation

## 2017-12-20 DIAGNOSIS — Z87442 Personal history of urinary calculi: Secondary | ICD-10-CM | POA: Insufficient documentation

## 2017-12-20 DIAGNOSIS — I2510 Atherosclerotic heart disease of native coronary artery without angina pectoris: Secondary | ICD-10-CM | POA: Diagnosis not present

## 2017-12-20 DIAGNOSIS — I5042 Chronic combined systolic (congestive) and diastolic (congestive) heart failure: Secondary | ICD-10-CM | POA: Insufficient documentation

## 2017-12-20 DIAGNOSIS — C159 Malignant neoplasm of esophagus, unspecified: Secondary | ICD-10-CM | POA: Diagnosis not present

## 2017-12-20 DIAGNOSIS — E1122 Type 2 diabetes mellitus with diabetic chronic kidney disease: Secondary | ICD-10-CM | POA: Diagnosis not present

## 2017-12-20 DIAGNOSIS — C7931 Secondary malignant neoplasm of brain: Secondary | ICD-10-CM | POA: Insufficient documentation

## 2017-12-20 DIAGNOSIS — C787 Secondary malignant neoplasm of liver and intrahepatic bile duct: Secondary | ICD-10-CM | POA: Diagnosis not present

## 2017-12-20 DIAGNOSIS — K219 Gastro-esophageal reflux disease without esophagitis: Secondary | ICD-10-CM | POA: Insufficient documentation

## 2017-12-20 DIAGNOSIS — E1151 Type 2 diabetes mellitus with diabetic peripheral angiopathy without gangrene: Secondary | ICD-10-CM | POA: Diagnosis not present

## 2017-12-20 DIAGNOSIS — C160 Malignant neoplasm of cardia: Secondary | ICD-10-CM | POA: Insufficient documentation

## 2017-12-20 DIAGNOSIS — I110 Hypertensive heart disease with heart failure: Secondary | ICD-10-CM | POA: Insufficient documentation

## 2017-12-20 DIAGNOSIS — Z803 Family history of malignant neoplasm of breast: Secondary | ICD-10-CM | POA: Insufficient documentation

## 2017-12-20 DIAGNOSIS — G4733 Obstructive sleep apnea (adult) (pediatric): Secondary | ICD-10-CM | POA: Insufficient documentation

## 2017-12-20 DIAGNOSIS — E119 Type 2 diabetes mellitus without complications: Secondary | ICD-10-CM | POA: Insufficient documentation

## 2017-12-20 DIAGNOSIS — F319 Bipolar disorder, unspecified: Secondary | ICD-10-CM | POA: Diagnosis not present

## 2017-12-20 DIAGNOSIS — R9082 White matter disease, unspecified: Secondary | ICD-10-CM | POA: Insufficient documentation

## 2017-12-20 DIAGNOSIS — R079 Chest pain, unspecified: Secondary | ICD-10-CM | POA: Insufficient documentation

## 2017-12-20 DIAGNOSIS — R011 Cardiac murmur, unspecified: Secondary | ICD-10-CM | POA: Insufficient documentation

## 2017-12-20 DIAGNOSIS — E7800 Pure hypercholesterolemia, unspecified: Secondary | ICD-10-CM | POA: Insufficient documentation

## 2017-12-20 NOTE — Telephone Encounter (Signed)
I called the patient's HCPOA and we discussed plans to move forward with SRS. He is still having dizziness, weakness, and nausea, and I encouraged her to try to increase dexamethasone to TID. She said they're also working with SW for facility placement.

## 2017-12-21 ENCOUNTER — Other Ambulatory Visit (HOSPITAL_COMMUNITY): Payer: Self-pay | Admitting: Hematology

## 2017-12-21 ENCOUNTER — Telehealth: Payer: Self-pay | Admitting: Radiation Therapy

## 2017-12-21 DIAGNOSIS — E1151 Type 2 diabetes mellitus with diabetic peripheral angiopathy without gangrene: Secondary | ICD-10-CM | POA: Diagnosis not present

## 2017-12-21 DIAGNOSIS — E1122 Type 2 diabetes mellitus with diabetic chronic kidney disease: Secondary | ICD-10-CM | POA: Diagnosis not present

## 2017-12-21 DIAGNOSIS — C159 Malignant neoplasm of esophagus, unspecified: Secondary | ICD-10-CM | POA: Diagnosis not present

## 2017-12-21 DIAGNOSIS — C787 Secondary malignant neoplasm of liver and intrahepatic bile duct: Secondary | ICD-10-CM | POA: Diagnosis not present

## 2017-12-21 DIAGNOSIS — I2510 Atherosclerotic heart disease of native coronary artery without angina pectoris: Secondary | ICD-10-CM | POA: Diagnosis not present

## 2017-12-21 DIAGNOSIS — F319 Bipolar disorder, unspecified: Secondary | ICD-10-CM | POA: Diagnosis not present

## 2017-12-21 NOTE — Telephone Encounter (Signed)
I spoke with Mr. King's sister, Langley Gauss about his uncontrolled diabetes. After reviewing his recent labs and levels for the past month we have recommended she reach out to his PCP or diabetic MD for better control/ management. Once he has been seen, we will move forward with getting him in for his planning session/ treatment. We would like to use IV contrast for his planning session, but with such high glucose levels do not feel comfortable holding the metformin he is on to have that done.   Mont Dutton R.T.(R)(T) Special Procedures Navigator  [phone]

## 2017-12-21 NOTE — Progress Notes (Signed)
Brain and Spine Tumor Board Documentation  RONDELL PARDON was presented by Cecil Cobbs, MD at Brain and Spine Tumor Board on 12/21/2017, which included representatives from neuro oncology, radiation oncology, surgical oncology, navigation, pathology, radiology.  Marquon was presented as a current patient with history of the following treatments:  .  Additionally, we reviewed previous medical and familial history, history of present illness, and recent lab results along with all available histopathologic and imaging studies. The tumor board considered available treatment options and made the following recommendations:  Radiation therapy (primary modality) Likely SRS  Tumor board is a meeting of clinicians from various specialty areas who evaluate and discuss patients for whom a multidisciplinary approach is being considered. Final determinations in the plan of care are those of the provider(s). The responsibility for follow up of recommendations given during tumor board is that of the provider.   Today's extended care, comprehensive team conference, Keller was not present for the discussion and was not examined.

## 2017-12-22 ENCOUNTER — Inpatient Hospital Stay (HOSPITAL_COMMUNITY): Payer: Medicare Other

## 2017-12-22 ENCOUNTER — Inpatient Hospital Stay (HOSPITAL_COMMUNITY): Payer: Medicare Other | Attending: Hematology

## 2017-12-22 ENCOUNTER — Ambulatory Visit: Payer: Medicare Other | Admitting: Nurse Practitioner

## 2017-12-22 ENCOUNTER — Other Ambulatory Visit: Payer: Self-pay | Admitting: Radiation Therapy

## 2017-12-22 ENCOUNTER — Other Ambulatory Visit: Payer: Self-pay

## 2017-12-22 DIAGNOSIS — Z800 Family history of malignant neoplasm of digestive organs: Secondary | ICD-10-CM | POA: Insufficient documentation

## 2017-12-22 DIAGNOSIS — C7931 Secondary malignant neoplasm of brain: Secondary | ICD-10-CM | POA: Diagnosis not present

## 2017-12-22 DIAGNOSIS — E119 Type 2 diabetes mellitus without complications: Secondary | ICD-10-CM | POA: Diagnosis not present

## 2017-12-22 DIAGNOSIS — M129 Arthropathy, unspecified: Secondary | ICD-10-CM | POA: Diagnosis not present

## 2017-12-22 DIAGNOSIS — Z794 Long term (current) use of insulin: Secondary | ICD-10-CM | POA: Insufficient documentation

## 2017-12-22 DIAGNOSIS — R5383 Other fatigue: Secondary | ICD-10-CM | POA: Insufficient documentation

## 2017-12-22 DIAGNOSIS — I110 Hypertensive heart disease with heart failure: Secondary | ICD-10-CM | POA: Diagnosis not present

## 2017-12-22 DIAGNOSIS — W19XXXA Unspecified fall, initial encounter: Secondary | ICD-10-CM | POA: Diagnosis not present

## 2017-12-22 DIAGNOSIS — C160 Malignant neoplasm of cardia: Secondary | ICD-10-CM | POA: Diagnosis not present

## 2017-12-22 DIAGNOSIS — G4730 Sleep apnea, unspecified: Secondary | ICD-10-CM | POA: Insufficient documentation

## 2017-12-22 DIAGNOSIS — Z9221 Personal history of antineoplastic chemotherapy: Secondary | ICD-10-CM | POA: Insufficient documentation

## 2017-12-22 DIAGNOSIS — E1165 Type 2 diabetes mellitus with hyperglycemia: Secondary | ICD-10-CM | POA: Diagnosis not present

## 2017-12-22 DIAGNOSIS — I252 Old myocardial infarction: Secondary | ICD-10-CM | POA: Diagnosis not present

## 2017-12-22 DIAGNOSIS — Z79899 Other long term (current) drug therapy: Secondary | ICD-10-CM | POA: Insufficient documentation

## 2017-12-22 DIAGNOSIS — Z803 Family history of malignant neoplasm of breast: Secondary | ICD-10-CM | POA: Diagnosis not present

## 2017-12-22 DIAGNOSIS — Z87442 Personal history of urinary calculi: Secondary | ICD-10-CM | POA: Insufficient documentation

## 2017-12-22 DIAGNOSIS — R011 Cardiac murmur, unspecified: Secondary | ICD-10-CM | POA: Diagnosis not present

## 2017-12-22 DIAGNOSIS — C787 Secondary malignant neoplasm of liver and intrahepatic bile duct: Secondary | ICD-10-CM | POA: Diagnosis not present

## 2017-12-22 DIAGNOSIS — I5042 Chronic combined systolic (congestive) and diastolic (congestive) heart failure: Secondary | ICD-10-CM | POA: Insufficient documentation

## 2017-12-22 DIAGNOSIS — E7800 Pure hypercholesterolemia, unspecified: Secondary | ICD-10-CM | POA: Insufficient documentation

## 2017-12-22 DIAGNOSIS — C159 Malignant neoplasm of esophagus, unspecified: Secondary | ICD-10-CM

## 2017-12-22 DIAGNOSIS — K219 Gastro-esophageal reflux disease without esophagitis: Secondary | ICD-10-CM | POA: Insufficient documentation

## 2017-12-22 DIAGNOSIS — F319 Bipolar disorder, unspecified: Secondary | ICD-10-CM | POA: Insufficient documentation

## 2017-12-22 DIAGNOSIS — I2510 Atherosclerotic heart disease of native coronary artery without angina pectoris: Secondary | ICD-10-CM | POA: Diagnosis not present

## 2017-12-22 DIAGNOSIS — C7949 Secondary malignant neoplasm of other parts of nervous system: Principal | ICD-10-CM

## 2017-12-22 DIAGNOSIS — R531 Weakness: Secondary | ICD-10-CM | POA: Diagnosis not present

## 2017-12-22 LAB — CBC WITH DIFFERENTIAL/PLATELET
Abs Immature Granulocytes: 0.14 10*3/uL — ABNORMAL HIGH (ref 0.00–0.07)
BASOS PCT: 0 %
Basophils Absolute: 0 10*3/uL (ref 0.0–0.1)
Eosinophils Absolute: 0 10*3/uL (ref 0.0–0.5)
Eosinophils Relative: 0 %
HEMATOCRIT: 47.8 % (ref 39.0–52.0)
Hemoglobin: 15.9 g/dL (ref 13.0–17.0)
IMMATURE GRANULOCYTES: 1 %
Lymphocytes Relative: 3 %
Lymphs Abs: 0.4 10*3/uL — ABNORMAL LOW (ref 0.7–4.0)
MCH: 28.6 pg (ref 26.0–34.0)
MCHC: 33.3 g/dL (ref 30.0–36.0)
MCV: 86 fL (ref 80.0–100.0)
MONOS PCT: 8 %
Monocytes Absolute: 0.9 10*3/uL (ref 0.1–1.0)
NEUTROS ABS: 10.1 10*3/uL — AB (ref 1.7–7.7)
NEUTROS PCT: 88 %
PLATELETS: 62 10*3/uL — AB (ref 150–400)
RBC: 5.56 MIL/uL (ref 4.22–5.81)
RDW: 14.7 % (ref 11.5–15.5)
WBC: 11.6 10*3/uL — AB (ref 4.0–10.5)
nRBC: 0 % (ref 0.0–0.2)

## 2017-12-22 LAB — COMPREHENSIVE METABOLIC PANEL
ALT: 24 U/L (ref 0–44)
AST: 20 U/L (ref 15–41)
Albumin: 3.1 g/dL — ABNORMAL LOW (ref 3.5–5.0)
Alkaline Phosphatase: 110 U/L (ref 38–126)
Anion gap: 11 (ref 5–15)
BUN: 30 mg/dL — ABNORMAL HIGH (ref 8–23)
CHLORIDE: 99 mmol/L (ref 98–111)
CO2: 21 mmol/L — ABNORMAL LOW (ref 22–32)
CREATININE: 0.97 mg/dL (ref 0.61–1.24)
Calcium: 8.9 mg/dL (ref 8.9–10.3)
GFR calc Af Amer: >60 mL/min (ref >60)
Glucose, Bld: 384 mg/dL — ABNORMAL HIGH (ref 70–99)
Potassium: 5 mmol/L (ref 3.5–5.1)
Sodium: 131 mmol/L — ABNORMAL LOW (ref 135–145)
Total Bilirubin: 1.5 mg/dL — ABNORMAL HIGH (ref 0.3–1.2)
Total Protein: 5.7 g/dL — ABNORMAL LOW (ref 6.5–8.1)

## 2017-12-22 MED ORDER — SODIUM CHLORIDE 0.9 % IV SOLN
Freq: Once | INTRAVENOUS | Status: AC
  Administered 2017-12-22: 10:00:00 via INTRAVENOUS

## 2017-12-22 MED ORDER — HEPARIN SOD (PORK) LOCK FLUSH 100 UNIT/ML IV SOLN
500.0000 [IU] | Freq: Once | INTRAVENOUS | Status: AC
  Administered 2017-12-22: 500 [IU] via INTRAVENOUS

## 2017-12-22 MED ORDER — INSULIN ASPART 100 UNIT/ML ~~LOC~~ SOLN
10.0000 [IU] | Freq: Once | SUBCUTANEOUS | Status: AC
  Administered 2017-12-22: 10 [IU] via SUBCUTANEOUS
  Filled 2017-12-22: qty 0.1

## 2017-12-22 MED ORDER — HEPARIN SOD (PORK) LOCK FLUSH 100 UNIT/ML IV SOLN
INTRAVENOUS | Status: AC
  Filled 2017-12-22: qty 5

## 2017-12-22 NOTE — Progress Notes (Signed)
Pt presents today for Folfox Cycle 3 Day 1. Labs abnormal. Reviewed with Dr. Delton Coombes. Per MD, " no treatment today." VO received to administer 10units of Novalog, NS 536ml bolus over one hour.  Per Dr. Genene Churn pt is to check back in one week for new appointment. Platelets 62. Pt states he is not feeling well today, and he has had intermittent pain since last night with diarrhea not controlled with home medication. Pt teaching pertaining to administration of medication for diarrhea. Pt states he had a fall Monday. Transport/ part time care giver at the bedside with him.

## 2017-12-22 NOTE — Progress Notes (Signed)
Spoke with [name], patient's POA, she stated the radiation oncologist had wanted to wait till his blood sugars come down before that make his mold/mask for radiation,  informed [name] that his sugar this morning was 384. Told her to call radiation doctor and let them know. [name] also knows to let us know when he starts radiation.    Patient complained of sharp intermitent abdominal pain throughout visit today. Patient also complaints of some intermittent pain in the middle to left chest, come and goes, denies any pain in his arm. Patient has been sleeping mostly during visit. Informed care giver if patient still complains of abdominal pain through out the day or gets worse to take him to the Emergency room.    Patient tolerated it well without problems. Vitals stable and discharged home from clinic via wheelchair with caregiver. Follow up as scheduled.

## 2017-12-22 NOTE — Patient Instructions (Signed)
[facility] at [facility] Discharge Instructions     Thank you for choosing [facility] at [facility] to provide your oncology and hematology care.  To afford each patient quality time with our provider, please arrive at least 15 minutes before your scheduled appointment time.   If you have a lab appointment with the Cancer Center please come in thru the  Main Entrance and check in at the main information desk  You need to re-schedule your appointment should you arrive 10 or more minutes late.  We strive to give you quality time with our providers, and arriving late affects you and other patients whose appointments are after yours.  Also, if you no show three or more times for appointments you may be dismissed from the clinic at the providers discretion.     Again, thank you for choosing [facility].  Our hope is that these requests will decrease the amount of time that you wait before being seen by our physicians.       _____________________________________________________________  Should you have questions after your visit to [facility], please contact our office at [phone] between the hours of 8:00 a.m. and 4:30 p.m.  Voicemails left after 4:00 p.m. will not be returned until the following business day.  For prescription refill requests, have your pharmacy contact our office and allow 72 hours.    Cancer Center Support Programs:   > Cancer Support Group  2nd Tuesday of the month 1pm-2pm, Journey Room    

## 2017-12-23 ENCOUNTER — Ambulatory Visit: Payer: Medicare Other | Admitting: Radiation Oncology

## 2017-12-23 DIAGNOSIS — F319 Bipolar disorder, unspecified: Secondary | ICD-10-CM | POA: Diagnosis not present

## 2017-12-23 DIAGNOSIS — E1151 Type 2 diabetes mellitus with diabetic peripheral angiopathy without gangrene: Secondary | ICD-10-CM | POA: Diagnosis not present

## 2017-12-23 DIAGNOSIS — I2510 Atherosclerotic heart disease of native coronary artery without angina pectoris: Secondary | ICD-10-CM | POA: Diagnosis not present

## 2017-12-23 DIAGNOSIS — C159 Malignant neoplasm of esophagus, unspecified: Secondary | ICD-10-CM | POA: Diagnosis not present

## 2017-12-23 DIAGNOSIS — C787 Secondary malignant neoplasm of liver and intrahepatic bile duct: Secondary | ICD-10-CM | POA: Diagnosis not present

## 2017-12-23 DIAGNOSIS — E1122 Type 2 diabetes mellitus with diabetic chronic kidney disease: Secondary | ICD-10-CM | POA: Diagnosis not present

## 2017-12-23 NOTE — Progress Notes (Signed)
Has armband been applied?  Yes  Does patient have an allergy to IV contrast dye?: No   Has patient ever received premedication for IV contrast dye?: No  Does patient take metformin?: Yes  If patient does take metformin when was the last dose: 12/23/2017  Date of lab work: 12/22/2017 BUN: 30 CR: 0.97 EGfr:>60  IV site: Left Chest port  Has IV site been added to flowsheet?  Yes  Repeat lab work scheduled for 12/27/2017

## 2017-12-24 ENCOUNTER — Inpatient Hospital Stay (HOSPITAL_BASED_OUTPATIENT_CLINIC_OR_DEPARTMENT_OTHER): Payer: Medicare Other | Admitting: Medical

## 2017-12-24 ENCOUNTER — Ambulatory Visit
Admission: RE | Admit: 2017-12-24 | Discharge: 2017-12-24 | Disposition: A | Discharge status: Home / Self Care | Payer: Medicare Other | Source: Ambulatory Visit | Attending: Radiation Oncology | Admitting: Radiation Oncology

## 2017-12-24 ENCOUNTER — Other Ambulatory Visit: Payer: Self-pay | Admitting: *Deleted

## 2017-12-24 ENCOUNTER — Encounter: Payer: Self-pay | Admitting: Radiation Oncology

## 2017-12-24 ENCOUNTER — Inpatient Hospital Stay: Payer: Medicare Other

## 2017-12-24 ENCOUNTER — Encounter (HOSPITAL_COMMUNITY): Payer: Self-pay

## 2017-12-24 ENCOUNTER — Other Ambulatory Visit: Payer: Self-pay

## 2017-12-24 VITALS — BP 109/77 | HR 95 | Resp 20

## 2017-12-24 VITALS — BP 86/57 | HR 55 | Temp 98.0°F | Resp 20

## 2017-12-24 DIAGNOSIS — M129 Arthropathy, unspecified: Secondary | ICD-10-CM

## 2017-12-24 DIAGNOSIS — I2510 Atherosclerotic heart disease of native coronary artery without angina pectoris: Secondary | ICD-10-CM | POA: Diagnosis not present

## 2017-12-24 DIAGNOSIS — K219 Gastro-esophageal reflux disease without esophagitis: Secondary | ICD-10-CM | POA: Diagnosis not present

## 2017-12-24 DIAGNOSIS — I110 Hypertensive heart disease with heart failure: Secondary | ICD-10-CM | POA: Diagnosis not present

## 2017-12-24 DIAGNOSIS — I252 Old myocardial infarction: Secondary | ICD-10-CM

## 2017-12-24 DIAGNOSIS — E860 Dehydration: Secondary | ICD-10-CM | POA: Diagnosis not present

## 2017-12-24 DIAGNOSIS — R9082 White matter disease, unspecified: Secondary | ICD-10-CM | POA: Diagnosis not present

## 2017-12-24 DIAGNOSIS — C787 Secondary malignant neoplasm of liver and intrahepatic bile duct: Secondary | ICD-10-CM

## 2017-12-24 DIAGNOSIS — G4733 Obstructive sleep apnea (adult) (pediatric): Secondary | ICD-10-CM

## 2017-12-24 DIAGNOSIS — C7931 Secondary malignant neoplasm of brain: Secondary | ICD-10-CM

## 2017-12-24 DIAGNOSIS — R079 Chest pain, unspecified: Secondary | ICD-10-CM

## 2017-12-24 DIAGNOSIS — Z803 Family history of malignant neoplasm of breast: Secondary | ICD-10-CM

## 2017-12-24 DIAGNOSIS — E119 Type 2 diabetes mellitus without complications: Secondary | ICD-10-CM | POA: Diagnosis not present

## 2017-12-24 DIAGNOSIS — E7800 Pure hypercholesterolemia, unspecified: Secondary | ICD-10-CM

## 2017-12-24 DIAGNOSIS — R011 Cardiac murmur, unspecified: Secondary | ICD-10-CM

## 2017-12-24 DIAGNOSIS — I5042 Chronic combined systolic (congestive) and diastolic (congestive) heart failure: Secondary | ICD-10-CM

## 2017-12-24 DIAGNOSIS — C160 Malignant neoplasm of cardia: Secondary | ICD-10-CM | POA: Diagnosis not present

## 2017-12-24 DIAGNOSIS — Z87442 Personal history of urinary calculi: Secondary | ICD-10-CM

## 2017-12-24 MED ORDER — HEPARIN SOD (PORK) LOCK FLUSH 100 UNIT/ML IV SOLN
500.0000 [IU] | Freq: Once | INTRAVENOUS | Status: AC
  Administered 2017-12-24: 500 [IU] via INTRAVENOUS
  Filled 2017-12-24: qty 5

## 2017-12-24 MED ORDER — SODIUM CHLORIDE 0.9% FLUSH
10.0000 mL | Freq: Once | INTRAVENOUS | Status: AC
  Administered 2017-12-24: 10 mL via INTRAVENOUS
  Filled 2017-12-24: qty 10

## 2017-12-24 MED ORDER — SODIUM CHLORIDE 0.9 % IV SOLN
Freq: Once | INTRAVENOUS | Status: AC
  Administered 2017-12-24: 13:00:00 via INTRAVENOUS
  Filled 2017-12-24: qty 250

## 2017-12-24 NOTE — Patient Instructions (Signed)
Dehydration, Adult Dehydration is a condition in which there is not enough fluid or water in the body. This happens when you lose more fluids than you take in. Important organs, such as the kidneys, brain, and heart, cannot function without a proper amount of fluids. Any loss of fluids from the body can lead to dehydration. Dehydration can range from mild to severe. This condition should be treated right away to prevent it from becoming severe. What are the causes? This condition may be caused by:  Vomiting.  Diarrhea.  Excessive sweating, such as from heat exposure or exercise.  Not drinking enough fluid, especially: ? When ill. ? While doing activity that requires a lot of energy.  Excessive urination.  Fever.  Infection.  Certain medicines, such as medicines that cause the body to lose excess fluid (diuretics).  Inability to access safe drinking water.  Reduced physical ability to get adequate water and food.  What increases the risk? This condition is more likely to develop in people:  Who have a poorly controlled long-term (chronic) illness, such as diabetes, heart disease, or kidney disease.  Who are age 65 or older.  Who are disabled.  Who live in a place with high altitude.  Who play endurance sports.  What are the signs or symptoms? Symptoms of mild dehydration may include:  Thirst.  Dry lips.  Slightly dry mouth.  Dry, warm skin.  Dizziness. Symptoms of moderate dehydration may include:  Very dry mouth.  Muscle cramps.  Dark urine. Urine may be the color of tea.  Decreased urine production.  Decreased tear production.  Heartbeat that is irregular or faster than normal (palpitations).  Headache.  Light-headedness, especially when you stand up from a sitting position.  Fainting (syncope). Symptoms of severe dehydration may include:  Changes in skin, such as: ? Cold and clammy skin. ? Blotchy (mottled) or pale skin. ? Skin that does  not quickly return to normal after being lightly pinched and released (poor skin turgor).  Changes in body fluids, such as: ? Extreme thirst. ? No tear production. ? Inability to sweat when body temperature is high, such as in hot weather. ? Very little urine production.  Changes in vital signs, such as: ? Weak pulse. ? Pulse that is more than 100 beats a minute when sitting still. ? Rapid breathing. ? Low blood pressure.  Other changes, such as: ? Sunken eyes. ? Cold hands and feet. ? Confusion. ? Lack of energy (lethargy). ? Difficulty waking up from sleep. ? Short-term weight loss. ? Unconsciousness. How is this diagnosed? This condition is diagnosed based on your symptoms and a physical exam. Blood and urine tests may be done to help confirm the diagnosis. How is this treated? Treatment for this condition depends on the severity. Mild or moderate dehydration can often be treated at home. Treatment should be started right away. Do not wait until dehydration becomes severe. Severe dehydration is an emergency and it needs to be treated in a hospital. Treatment for mild dehydration may include:  Drinking more fluids.  Replacing salts and minerals in your blood (electrolytes) that you may have lost. Treatment for moderate dehydration may include:  Drinking an oral rehydration solution (ORS). This is a drink that helps you replace fluids and electrolytes (rehydrate). It can be found at pharmacies and retail stores. Treatment for severe dehydration may include:  Receiving fluids through an IV tube.  Receiving an electrolyte solution through a feeding tube that is passed through your nose   and into your stomach (nasogastric tube, or NG tube).  Correcting any abnormalities in electrolytes.  Treating the underlying cause of dehydration. Follow these instructions at home:  If directed by your health care provider, drink an ORS: ? Make an ORS by following instructions on the  package. ? Start by drinking small amounts, about  cup (120 mL) every 5-10 minutes. ? Slowly increase how much you drink until you have taken the amount recommended by your health care provider.  Drink enough clear fluid to keep your urine clear or pale yellow. If you were told to drink an ORS, finish the ORS first, then start slowly drinking other clear fluids. Drink fluids such as: ? Water. Do not drink only water. Doing that can lead to having too little salt (sodium) in the body (hyponatremia). ? Ice chips. ? Fruit juice that you have added water to (diluted fruit juice). ? Low-calorie sports drinks.  Avoid: ? Alcohol. ? Drinks that contain a lot of sugar. These include high-calorie sports drinks, fruit juice that is not diluted, and soda. ? Caffeine. ? Foods that are greasy or contain a lot of fat or sugar.  Take over-the-counter and prescription medicines only as told by your health care provider.  Do not take sodium tablets. This can lead to having too much sodium in the body (hypernatremia).  Eat foods that contain a healthy balance of electrolytes, such as bananas, oranges, potatoes, tomatoes, and spinach.  Keep all follow-up visits as told by your health care provider. This is important. Contact a health care provider if:  You have abdominal pain that: ? Gets worse. ? Stays in one area (localizes).  You have a rash.  You have a stiff neck.  You are more irritable than usual.  You are sleepier or more difficult to wake up than usual.  You feel weak or dizzy.  You feel very thirsty.  You have urinated only a small amount of very dark urine over 6-8 hours. Get help right away if:  You have symptoms of severe dehydration.  You cannot drink fluids without vomiting.  Your symptoms get worse with treatment.  You have a fever.  You have a severe headache.  You have vomiting or diarrhea that: ? Gets worse. ? Does not go away.  You have blood or green matter  (bile) in your vomit.  You have blood in your stool. This may cause stool to look black and tarry.  You have not urinated in 6-8 hours.  You faint.  Your heart rate while sitting still is over 100 beats a minute.  You have trouble breathing. This information is not intended to replace advice given to you by your health care provider. Make sure you discuss any questions you have with your health care provider. Document Released: 02/02/2005 Document Revised: 08/30/2015 Document Reviewed: 03/29/2015 Elsevier Interactive Patient Education  2018 [employer]  

## 2017-12-24 NOTE — Progress Notes (Signed)
Patient reported to the clinic today for simulation.  He was noted to have low BP 86/57 and lethargic.  Patient states he had taken his BP medicine this am and reports a recent ER visit where he received IV fluids.  He complains of some abdominal pain as well as chest pain.  He states the chest pain does not radiate down his arm and it does not feel like pressure, he voiced it is "stretching pain".  He is a diabetic with uncontrolled glucose, takes glipizide 10 mg daily, tradjenta 5mg  daily, and metformin 500 mg BID.  He states his blood glucose this morning was 456.  He has recently started taking sliding scale insulin at home.  He is on Dexamethasone 4mg  BID.  Dr. Lisbeth Renshaw has requested a visit in the symptom management clinic as well as IV fluids.  Will continue to follow as necessary.  BP (!) 86/57 (BP Location: Left Arm)   Pulse (!) 55   Temp 98 F (36.7 C) (Oral)   Resp 20   SpO2 99%

## 2017-12-24 NOTE — Progress Notes (Signed)
Received VM from [facility] with Dr. Lisbeth Renshaw regarding pt c/o chest pain and abdominal pain to see if [name] could visit with pt while receiving IVF. In VM, clarified that pt did not have any chest pressure. Found pt in lobby to talk about chest pain. Pt verbalized that he has had it intermittently for a few days. Denied radiating down left arm. No diaphoresis noted. Through further assessment, pt verbalized that he does get SOB with minimal activity, but breathing not labored or rapid beyond 20bpm. EKG performed d/t concern regarding pt behavior and verbalization of SOB. PVCs noted and sinus tachycardia, with some potential infarcts. Zachary Mealy, PA called to see pt. Compared current EKG to previous and noted similarity. Pt to f/u with PCP or cardiologist soon to review cardiac status. Pt driver notified. Aware of all future appts.

## 2017-12-27 ENCOUNTER — Inpatient Hospital Stay (HOSPITAL_COMMUNITY)
Admission: EM | Admit: 2017-12-27 | Discharge: 2018-01-01 | DRG: 637 | Disposition: A | Discharge status: Skilled Nursing Facility | Payer: Medicare Other | Attending: Pulmonary Disease | Admitting: Pulmonary Disease

## 2017-12-27 ENCOUNTER — Telehealth: Payer: Self-pay | Admitting: Radiation Therapy

## 2017-12-27 ENCOUNTER — Other Ambulatory Visit: Payer: Self-pay

## 2017-12-27 ENCOUNTER — Telehealth: Payer: Self-pay | Admitting: Medical

## 2017-12-27 ENCOUNTER — Encounter (HOSPITAL_COMMUNITY): Payer: Self-pay | Admitting: Emergency Medicine

## 2017-12-27 ENCOUNTER — Inpatient Hospital Stay (HOSPITAL_COMMUNITY): Payer: Medicare Other

## 2017-12-27 DIAGNOSIS — F319 Bipolar disorder, unspecified: Secondary | ICD-10-CM | POA: Diagnosis present

## 2017-12-27 DIAGNOSIS — E860 Dehydration: Secondary | ICD-10-CM | POA: Diagnosis present

## 2017-12-27 DIAGNOSIS — Z803 Family history of malignant neoplasm of breast: Secondary | ICD-10-CM

## 2017-12-27 DIAGNOSIS — Z66 Do not resuscitate: Secondary | ICD-10-CM | POA: Diagnosis not present

## 2017-12-27 DIAGNOSIS — E1159 Type 2 diabetes mellitus with other circulatory complications: Secondary | ICD-10-CM | POA: Diagnosis present

## 2017-12-27 DIAGNOSIS — R1312 Dysphagia, oropharyngeal phase: Secondary | ICD-10-CM | POA: Diagnosis not present

## 2017-12-27 DIAGNOSIS — Z8505 Personal history of malignant neoplasm of liver: Secondary | ICD-10-CM | POA: Diagnosis not present

## 2017-12-27 DIAGNOSIS — C160 Malignant neoplasm of cardia: Secondary | ICD-10-CM | POA: Diagnosis present

## 2017-12-27 DIAGNOSIS — Z634 Disappearance and death of family member: Secondary | ICD-10-CM

## 2017-12-27 DIAGNOSIS — I5022 Chronic systolic (congestive) heart failure: Secondary | ICD-10-CM | POA: Diagnosis not present

## 2017-12-27 DIAGNOSIS — F250 Schizoaffective disorder, bipolar type: Secondary | ICD-10-CM | POA: Diagnosis present

## 2017-12-27 DIAGNOSIS — R41841 Cognitive communication deficit: Secondary | ICD-10-CM | POA: Diagnosis not present

## 2017-12-27 DIAGNOSIS — E1165 Type 2 diabetes mellitus with hyperglycemia: Secondary | ICD-10-CM | POA: Diagnosis present

## 2017-12-27 DIAGNOSIS — E871 Hypo-osmolality and hyponatremia: Secondary | ICD-10-CM | POA: Diagnosis present

## 2017-12-27 DIAGNOSIS — I5043 Acute on chronic combined systolic (congestive) and diastolic (congestive) heart failure: Secondary | ICD-10-CM | POA: Diagnosis present

## 2017-12-27 DIAGNOSIS — N179 Acute kidney failure, unspecified: Secondary | ICD-10-CM | POA: Diagnosis present

## 2017-12-27 DIAGNOSIS — C787 Secondary malignant neoplasm of liver and intrahepatic bile duct: Secondary | ICD-10-CM | POA: Diagnosis present

## 2017-12-27 DIAGNOSIS — E1122 Type 2 diabetes mellitus with diabetic chronic kidney disease: Secondary | ICD-10-CM | POA: Diagnosis present

## 2017-12-27 DIAGNOSIS — E1340 Other specified diabetes mellitus with diabetic neuropathy, unspecified: Secondary | ICD-10-CM | POA: Diagnosis not present

## 2017-12-27 DIAGNOSIS — I130 Hypertensive heart and chronic kidney disease with heart failure and stage 1 through stage 4 chronic kidney disease, or unspecified chronic kidney disease: Secondary | ICD-10-CM | POA: Diagnosis present

## 2017-12-27 DIAGNOSIS — C7931 Secondary malignant neoplasm of brain: Secondary | ICD-10-CM | POA: Diagnosis not present

## 2017-12-27 DIAGNOSIS — R262 Difficulty in walking, not elsewhere classified: Secondary | ICD-10-CM | POA: Diagnosis not present

## 2017-12-27 DIAGNOSIS — R739 Hyperglycemia, unspecified: Secondary | ICD-10-CM | POA: Diagnosis present

## 2017-12-27 DIAGNOSIS — G4733 Obstructive sleep apnea (adult) (pediatric): Secondary | ICD-10-CM | POA: Diagnosis present

## 2017-12-27 DIAGNOSIS — I2510 Atherosclerotic heart disease of native coronary artery without angina pectoris: Secondary | ICD-10-CM | POA: Diagnosis present

## 2017-12-27 DIAGNOSIS — E1151 Type 2 diabetes mellitus with diabetic peripheral angiopathy without gangrene: Secondary | ICD-10-CM | POA: Diagnosis not present

## 2017-12-27 DIAGNOSIS — R1310 Dysphagia, unspecified: Secondary | ICD-10-CM

## 2017-12-27 DIAGNOSIS — I5033 Acute on chronic diastolic (congestive) heart failure: Secondary | ICD-10-CM | POA: Diagnosis not present

## 2017-12-27 DIAGNOSIS — Z8501 Personal history of malignant neoplasm of esophagus: Secondary | ICD-10-CM

## 2017-12-27 DIAGNOSIS — Z9089 Acquired absence of other organs: Secondary | ICD-10-CM

## 2017-12-27 DIAGNOSIS — Z794 Long term (current) use of insulin: Secondary | ICD-10-CM

## 2017-12-27 DIAGNOSIS — Z8249 Family history of ischemic heart disease and other diseases of the circulatory system: Secondary | ICD-10-CM

## 2017-12-27 DIAGNOSIS — M6281 Muscle weakness (generalized): Secondary | ICD-10-CM | POA: Diagnosis not present

## 2017-12-27 DIAGNOSIS — I252 Old myocardial infarction: Secondary | ICD-10-CM

## 2017-12-27 DIAGNOSIS — C158 Malignant neoplasm of overlapping sites of esophagus: Secondary | ICD-10-CM

## 2017-12-27 DIAGNOSIS — C159 Malignant neoplasm of esophagus, unspecified: Secondary | ICD-10-CM | POA: Diagnosis present

## 2017-12-27 DIAGNOSIS — C7949 Secondary malignant neoplasm of other parts of nervous system: Principal | ICD-10-CM

## 2017-12-27 DIAGNOSIS — K219 Gastro-esophageal reflux disease without esophagitis: Secondary | ICD-10-CM | POA: Diagnosis present

## 2017-12-27 DIAGNOSIS — M1990 Unspecified osteoarthritis, unspecified site: Secondary | ICD-10-CM | POA: Diagnosis present

## 2017-12-27 DIAGNOSIS — Z79899 Other long term (current) drug therapy: Secondary | ICD-10-CM

## 2017-12-27 DIAGNOSIS — Z7189 Other specified counseling: Secondary | ICD-10-CM | POA: Diagnosis not present

## 2017-12-27 DIAGNOSIS — N183 Chronic kidney disease, stage 3 unspecified: Secondary | ICD-10-CM | POA: Diagnosis present

## 2017-12-27 DIAGNOSIS — R130 Aphagia: Secondary | ICD-10-CM | POA: Diagnosis not present

## 2017-12-27 DIAGNOSIS — I10 Essential (primary) hypertension: Secondary | ICD-10-CM | POA: Diagnosis present

## 2017-12-27 DIAGNOSIS — E782 Mixed hyperlipidemia: Secondary | ICD-10-CM | POA: Diagnosis present

## 2017-12-27 DIAGNOSIS — Z87442 Personal history of urinary calculi: Secondary | ICD-10-CM

## 2017-12-27 DIAGNOSIS — Z515 Encounter for palliative care: Secondary | ICD-10-CM | POA: Diagnosis not present

## 2017-12-27 LAB — BASIC METABOLIC PANEL
ANION GAP: 11 (ref 5–15)
BUN: 32 mg/dL — ABNORMAL HIGH (ref 8–23)
CO2: 23 mmol/L (ref 22–32)
Calcium: 8.6 mg/dL — ABNORMAL LOW (ref 8.9–10.3)
Chloride: 95 mmol/L — ABNORMAL LOW (ref 98–111)
Creatinine, Ser: 1.08 mg/dL (ref 0.61–1.24)
GFR calc non Af Amer: >60 mL/min (ref >60)
Glucose, Bld: 717 mg/dL (ref 70–99)
Potassium: 5.6 mmol/L — ABNORMAL HIGH (ref 3.5–5.1)
SODIUM: 129 mmol/L — AB (ref 135–145)

## 2017-12-27 LAB — URINALYSIS, ROUTINE W REFLEX MICROSCOPIC
BILIRUBIN URINE: NEGATIVE
Bacteria, UA: NONE SEEN
Bilirubin Urine: NEGATIVE
Glucose, UA: >=500 mg/dL — AB
HGB URINE DIPSTICK: NEGATIVE
Hgb urine dipstick: NEGATIVE
KETONES UR: 5 mg/dL — AB
KETONES UR: 5 mg/dL — AB
LEUKOCYTES UA: NEGATIVE
LEUKOCYTES UA: NEGATIVE
NITRITE: NEGATIVE
Nitrite: NEGATIVE
PROTEIN: NEGATIVE mg/dL
Protein, ur: 30 mg/dL — AB
Specific Gravity, Urine: 1.023 (ref 1.005–1.030)
Specific Gravity, Urine: 1.025 (ref 1.005–1.030)
pH: 6 (ref 5.0–8.0)
pH: 5 (ref 5.0–8.0)

## 2017-12-27 LAB — CBC
HCT: 44.3 % (ref 39.0–52.0)
Hemoglobin: 15.1 g/dL (ref 13.0–17.0)
MCH: 30 pg (ref 26.0–34.0)
MCHC: 34.1 g/dL (ref 30.0–36.0)
MCV: 87.9 fL (ref 80.0–100.0)
NRBC: 0 % (ref 0.0–0.2)
Platelets: 74 10*3/uL — ABNORMAL LOW (ref 150–400)
RBC: 5.04 MIL/uL (ref 4.22–5.81)
RDW: 14.7 % (ref 11.5–15.5)
WBC: 6.8 10*3/uL (ref 4.0–10.5)

## 2017-12-27 LAB — COMPREHENSIVE METABOLIC PANEL
ALT: 33 U/L (ref 0–44)
ANION GAP: 10 (ref 5–15)
AST: 20 U/L (ref 15–41)
Albumin: 2.9 g/dL — ABNORMAL LOW (ref 3.5–5.0)
Alkaline Phosphatase: 139 U/L — ABNORMAL HIGH (ref 38–126)
BILIRUBIN TOTAL: 1.2 mg/dL (ref 0.3–1.2)
BUN: 33 mg/dL — ABNORMAL HIGH (ref 8–23)
CHLORIDE: 97 mmol/L — AB (ref 98–111)
CO2: 22 mmol/L (ref 22–32)
Calcium: 8.3 mg/dL — ABNORMAL LOW (ref 8.9–10.3)
Creatinine, Ser: 1.06 mg/dL (ref 0.61–1.24)
Glucose, Bld: 608 mg/dL (ref 70–99)
POTASSIUM: 5.2 mmol/L — AB (ref 3.5–5.1)
Sodium: 129 mmol/L — ABNORMAL LOW (ref 135–145)
TOTAL PROTEIN: 5.4 g/dL — AB (ref 6.5–8.1)

## 2017-12-27 LAB — CBC WITH DIFFERENTIAL/PLATELET
ABS IMMATURE GRANULOCYTES: 0.32 10*3/uL — AB (ref 0.00–0.07)
BASOS ABS: 0 10*3/uL (ref 0.0–0.1)
BASOS PCT: 0 %
Eosinophils Absolute: 0 10*3/uL (ref 0.0–0.5)
Eosinophils Relative: 0 %
HCT: 43.4 % (ref 39.0–52.0)
HEMOGLOBIN: 14.8 g/dL (ref 13.0–17.0)
Immature Granulocytes: 5 %
LYMPHS PCT: 5 %
Lymphs Abs: 0.4 10*3/uL — ABNORMAL LOW (ref 0.7–4.0)
MCH: 30 pg (ref 26.0–34.0)
MCHC: 34.1 g/dL (ref 30.0–36.0)
MCV: 88 fL (ref 80.0–100.0)
Monocytes Absolute: 0.7 10*3/uL (ref 0.1–1.0)
Monocytes Relative: 10 %
NEUTROS ABS: 5.5 10*3/uL (ref 1.7–7.7)
NEUTROS PCT: 80 %
NRBC: 0 % (ref 0.0–0.2)
PLATELETS: 76 10*3/uL — AB (ref 150–400)
RBC: 4.93 MIL/uL (ref 4.22–5.81)
RDW: 14.6 % (ref 11.5–15.5)
WBC: 6.9 10*3/uL (ref 4.0–10.5)

## 2017-12-27 LAB — PROTIME-INR
INR: 0.93
Prothrombin Time: 12.3 seconds (ref 11.4–15.2)

## 2017-12-27 LAB — LACTIC ACID, PLASMA
LACTIC ACID, VENOUS: 2.4 mmol/L — AB (ref 0.5–1.9)
Lactic Acid, Venous: 1.8 mmol/L (ref 0.5–1.9)

## 2017-12-27 LAB — CBG MONITORING, ED
Glucose-Capillary: >600 mg/dL (ref 70–99)
Glucose-Capillary: 384 mg/dL — ABNORMAL HIGH (ref 70–99)
Glucose-Capillary: 441 mg/dL — ABNORMAL HIGH (ref 70–99)
Glucose-Capillary: 224 mg/dL — ABNORMAL HIGH (ref 70–99)

## 2017-12-27 LAB — BLOOD GAS, VENOUS
Acid-Base Excess: 1.7 mmol/L (ref 0.0–2.0)
Bicarbonate: 25.9 mmol/L (ref 20.0–28.0)
FIO2: 0.21
O2 Saturation: 91.7 %
PATIENT TEMPERATURE: 37
PCO2 VEN: 39.5 mmHg — AB (ref 44.0–60.0)
PH VEN: 7.429 (ref 7.250–7.430)
pO2, Ven: 62.8 mmHg — ABNORMAL HIGH (ref 32.0–45.0)

## 2017-12-27 LAB — GLUCOSE, CAPILLARY
Glucose-Capillary: 89 mg/dL (ref 70–99)
Glucose-Capillary: 299 mg/dL — ABNORMAL HIGH (ref 70–99)

## 2017-12-27 LAB — MRSA PCR SCREENING: MRSA by PCR: NEGATIVE

## 2017-12-27 MED ORDER — FUROSEMIDE 40 MG PO TABS
40.0000 mg | ORAL_TABLET | Freq: Every day | ORAL | Status: DC
  Administered 2017-12-28 – 2018-01-01 (×5): 40 mg via ORAL
  Filled 2017-12-27 (×6): qty 1

## 2017-12-27 MED ORDER — OXYCODONE-ACETAMINOPHEN 5-325 MG PO TABS: 1.0000 | ORAL_TABLET | ORAL | Status: DC | PRN

## 2017-12-27 MED ORDER — PROCHLORPERAZINE MALEATE 5 MG PO TABS
10.0000 mg | ORAL_TABLET | Freq: Four times a day (QID) | ORAL | Status: DC | PRN
  Administered 2017-12-27 – 2017-12-28 (×2): 10 mg via ORAL
  Filled 2017-12-27 (×2): qty 2

## 2017-12-27 MED ORDER — FENTANYL CITRATE (PF) 100 MCG/2ML IJ SOLN
25.0000 ug | INTRAMUSCULAR | Status: DC | PRN
  Administered 2017-12-27 – 2017-12-29 (×2): 25 ug via INTRAVENOUS
  Filled 2017-12-27 (×2): qty 2

## 2017-12-27 MED ORDER — ONDANSETRON 4 MG PO TBDP: 8.0000 mg | ORAL_TABLET | Freq: Three times a day (TID) | ORAL | Status: DC | PRN

## 2017-12-27 MED ORDER — MILK THISTLE 175 MG PO TABS: 175.0000 mg | ORAL_TABLET | Freq: Every day | ORAL | Status: DC

## 2017-12-27 MED ORDER — ROSUVASTATIN CALCIUM 10 MG PO TABS
10.0000 mg | ORAL_TABLET | Freq: Every day | ORAL | Status: DC
  Administered 2017-12-28 – 2018-01-01 (×5): 10 mg via ORAL
  Filled 2017-12-27 (×5): qty 1

## 2017-12-27 MED ORDER — LUBIPROSTONE 24 MCG PO CAPS
24.0000 ug | ORAL_CAPSULE | ORAL | Status: DC
  Administered 2017-12-30: 24 ug via ORAL
  Filled 2017-12-27 (×2): qty 1

## 2017-12-27 MED ORDER — INSULIN REGULAR(HUMAN) IN NACL 100-0.9 UT/100ML-% IV SOLN
INTRAVENOUS | Status: DC
  Administered 2017-12-27: 5.4 [IU]/h via INTRAVENOUS
  Administered 2017-12-27: 3.3 [IU]/h via INTRAVENOUS
  Administered 2017-12-27: 3.2 [IU]/h via INTRAVENOUS
  Filled 2017-12-27: qty 100

## 2017-12-27 MED ORDER — INSULIN ASPART 100 UNIT/ML ~~LOC~~ SOLN
0.0000 [IU] | Freq: Three times a day (TID) | SUBCUTANEOUS | Status: DC
  Administered 2017-12-28: 8 [IU] via SUBCUTANEOUS

## 2017-12-27 MED ORDER — PANTOPRAZOLE SODIUM 40 MG PO TBEC
40.0000 mg | DELAYED_RELEASE_TABLET | Freq: Two times a day (BID) | ORAL | Status: DC
  Administered 2017-12-27 – 2018-01-01 (×10): 40 mg via ORAL
  Filled 2017-12-27 (×10): qty 1

## 2017-12-27 MED ORDER — INSULIN GLARGINE 100 UNIT/ML ~~LOC~~ SOLN
15.0000 [IU] | Freq: Every day | SUBCUTANEOUS | Status: DC
  Administered 2017-12-27: 15 [IU] via SUBCUTANEOUS
  Filled 2017-12-27 (×2): qty 0.15

## 2017-12-27 MED ORDER — ACETAMINOPHEN 325 MG PO TABS: 650.0000 mg | ORAL_TABLET | Freq: Four times a day (QID) | ORAL | Status: DC | PRN

## 2017-12-27 MED ORDER — SODIUM CHLORIDE 0.9 % IV BOLUS
1000.0000 mL | Freq: Once | INTRAVENOUS | Status: AC
  Administered 2017-12-27: 1000 mL via INTRAVENOUS

## 2017-12-27 MED ORDER — GLIPIZIDE ER 5 MG PO TB24: 10.0000 mg | ORAL_TABLET | Freq: Every day | ORAL | Status: DC

## 2017-12-27 MED ORDER — CARVEDILOL 12.5 MG PO TABS
12.5000 mg | ORAL_TABLET | Freq: Two times a day (BID) | ORAL | Status: DC
  Administered 2017-12-28 – 2018-01-01 (×9): 12.5 mg via ORAL
  Filled 2017-12-27 (×10): qty 1

## 2017-12-27 MED ORDER — SODIUM CHLORIDE 0.9 % IV SOLN
INTRAVENOUS | Status: DC
  Administered 2017-12-27 – 2017-12-30 (×6): via INTRAVENOUS

## 2017-12-27 MED ORDER — ENOXAPARIN SODIUM 40 MG/0.4ML ~~LOC~~ SOLN: 40.0000 mg | SUBCUTANEOUS | Status: DC

## 2017-12-27 MED ORDER — DEXAMETHASONE 4 MG PO TABS
4.0000 mg | ORAL_TABLET | Freq: Two times a day (BID) | ORAL | Status: DC
  Administered 2017-12-27 – 2017-12-30 (×6): 4 mg via ORAL
  Filled 2017-12-27 (×6): qty 1

## 2017-12-27 MED ORDER — INSULIN ASPART 100 UNIT/ML ~~LOC~~ SOLN
0.0000 [IU] | Freq: Every day | SUBCUTANEOUS | Status: DC
  Administered 2017-12-27: 3 [IU] via SUBCUTANEOUS

## 2017-12-27 MED ORDER — OLANZAPINE 5 MG PO TABS
5.0000 mg | ORAL_TABLET | Freq: Two times a day (BID) | ORAL | Status: DC
  Administered 2017-12-27 – 2018-01-01 (×10): 5 mg via ORAL
  Filled 2017-12-27 (×10): qty 1

## 2017-12-27 NOTE — ED Provider Notes (Addendum)
[facility] EMERGENCY DEPARTMENT Provider Note   CSN: [id number] Arrival date & time: 12/27/17  1115     History   Chief Complaint Chief Complaint  Patient presents with  . Hyperglycemia    HPI [name] is a 67 y.o. male.  HPI Patient presents to the emergency room for evaluation of hyperglycemia.  Patient has a history of metastatic esophageal cancer.  Patient has history of mets to the liver and brain and is currently on steroids.  Patient has had trouble with increasing weakness and fatigue.  Patient lives at home alone and he has family members that are trying to watch over him.  Patient has had elevated blood sugars for several months.  According to the family member he has been running in the 400-500s.  Only last week he was started on 12 units of insulin.  Prior to that the patient, had been on oral hypoglycemics.  Today, he got routine blood work.  They were told to come back to the hospital because his blood sugar was over 600.  Patient said increased urination.  He has had balance issues and increased thirst.  Patient did fall way back to his house but did not sustain any serious injuries.  Past Medical History:  Diagnosis Date  . Arthritis   . Bipolar disorder ([place])   . Brain cancer ([place]) 2019  . CAD (coronary artery disease) 08/16/2016  . Cancer ([place])    esophageal with liver mets  . Cellulitis and abscess of foot 03/05/2016  . Chronic combined systolic and diastolic heart failure ([place])   . Diabetes mellitus without complication ([place])    boderline  . GERD (gastroesophageal reflux disease)   . Heart murmur 1995  . History of kidney stones   . Hypercholesteremia   . Hypertension   . Kidney stones   . Liver cancer ([place]) 2019  . Myocardial infarction ([place])   . OSA (obstructive sleep apnea)    no cpap, can't tolerate  . Schizoaffective disorder, bipolar type ([place]) 04/25/2016    Patient Active Problem List   Diagnosis Date Noted  . Brain metastasis ([place])  12/10/2017  . Uncontrolled type 2 diabetes mellitus with hyperglycemia ([place]) 05/27/2017  . Mixed hyperlipidemia 05/27/2017  . Decreased oral intake 04/22/2017  . Malignant neoplasm of esophagus ([place])   . 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 Goals of care, counseling/discussion 01/29/2017  . Dysphagia 01/29/2017  . GERD (gastroesophageal reflux disease) 01/29/2017  . Acute renal failure superimposed on stage 3 chronic kidney disease ([place]) 08/18/2016  . CHF (congestive heart failure) ([place]) 08/16/2016  . Acute on chronic combined systolic and diastolic CHF (congestive heart failure) ([employer]) 08/16/2016  . OSA (obstructive sleep apnea) 08/16/2016  . Acute renal insufficiency 08/16/2016  . Acute on chronic combined systolic (congestive) and diastolic (congestive) heart failure ([place]) 08/16/2016  . CAD (coronary artery disease) 08/16/2016  . Schizoaffective disorder, bipolar type ([place]) 04/25/2016  . Acute on chronic diastolic heart failure ([place]) 03/07/2016  . Cellulitis and abscess of foot 03/05/2016  . DM type 2 causing vascular disease ([place]) 03/05/2016  . Essential hypertension, benign 03/05/2016  . Involuntary commitment 03/05/2016  . Cellulitis 03/05/2016    Past Surgical History:  Procedure Laterality Date  . APPENDECTOMY    . BIOPSY  03/09/2017   Procedure: BIOPSY;  Surgeon: Danie Binder, MD;  Location: [facility];  Service: Endoscopy;;  gastric esophageal  . BIOPSY  11/23/2017   Procedure: BIOPSY;  Surgeon: Oneida Alar,  Marga Melnick, MD;  Location: [facility];  Service: Endoscopy;;  esophagus  . CHOLECYSTECTOMY N/A 10/20/2013   Procedure: LAPAROSCOPIC CHOLECYSTECTOMY;  Surgeon: [name], MD;  Location: [facility] ORS;  Service: General;  Laterality: N/A;  . COLONOSCOPY WITH PROPOFOL N/A 03/09/2017   Procedure: COLONOSCOPY WITH PROPOFOL;  Surgeon: Danie Binder, MD;  Location: [facility];  Service: Endoscopy;  Laterality: N/A;  12:15pm  .  ESOPHAGOGASTRODUODENOSCOPY (EGD) WITH PROPOFOL N/A 03/09/2017   Procedure: ESOPHAGOGASTRODUODENOSCOPY (EGD) WITH PROPOFOL;  Surgeon: Danie Binder, MD;  [facility];  Service: Endoscopy;  Laterality: N/A;  . ESOPHAGOGASTRODUODENOSCOPY (EGD) WITH PROPOFOL N/A 11/23/2017   Procedure: ESOPHAGOGASTRODUODENOSCOPY (EGD) WITH PROPOFOL;  Surgeon: Danie Binder, MD;  [facility];  Service: Endoscopy;  Laterality: N/A;  10:00am  . EUS N/A 04/08/2017   Procedure: UPPER ENDOSCOPIC ULTRASOUND (EUS) RADIAL;  Surgeon: Milus Banister, MD;  Location: [facility];  Service: Endoscopy;  Laterality: N/A;  . HIP SURGERY Left   . KNEE SURGERY Left   . POLYPECTOMY  03/09/2017   Procedure: POLYPECTOMY;  Surgeon: Danie Binder, MD;  [facility];  Service: Endoscopy;;  colon   . PORTACATH PLACEMENT Left 04/07/2017   Procedure: INSERTION PORT-A-CATH LEFT SUBCLAVIAN;  Surgeon: Virl Cagey, MD;  Location: [facility];  Service: General;  Laterality: Left;  . SAVORY DILATION N/A 03/09/2017   Procedure: SAVORY DILATION;  Surgeon: Danie Binder, MD;  [facility];  Service: Endoscopy;  Laterality: N/A;        Home Medications    Prior to Admission medications   Medication Sig Start Date End Date Taking? Authorizing Provider  AMITIZA 24 MCG capsule Take 24 mcg by mouth 2 (two) times a week.  08/06/16  Yes [provider]  carvedilol (COREG) 12.5 MG tablet TAKE 1 TABLET(12.5 MG) BY MOUTH TWICE DAILY Patient taking differently: Take 12.5 mg by mouth 2 (two) times daily with a meal.  09/02/17  Yes Lendon Colonel, NP  dexamethasone (DECADRON) 4 MG tablet Take 1 tablet (4 mg total) by mouth 2 (two) times daily. 12/10/17  Yes Hayden Pedro, PA-C  ENTRESTO 49-51 MG TAKE 1 TABLET BY MOUTH TWICE DAILY Patient taking differently: Take 1 tablet by mouth 2 (two) times daily.  07/21/17  Yes Herminio Commons, MD  furosemide (LASIX) 40 MG tablet Alternate 40  mg one day and 20 mg the next Patient taking differently: Take 40 mg by mouth daily.  07/17/16  Yes Herminio Commons, MD  glipiZIDE (GLUCOTROL XL) 10 MG 24 hr tablet Take 1 tablet (10 mg total) by mouth daily with breakfast. 12/10/17  Yes Nida, Marella Chimes, MD  Insulin Glargine (BASAGLAR KWIKPEN) 100 UNIT/ML SOPN 12 Units daily. 12/22/17  Yes [provider]  lidocaine-prilocaine (EMLA) cream Apply small amount over port site and cover with plastic wrap one hour prior to appointment. 11/19/17  Yes Derek Jack, MD  linagliptin (TRADJENTA) 5 MG TABS tablet Take 5 mg by mouth daily.   Yes [provider]  metFORMIN (GLUCOPHAGE) 500 MG tablet TAKE 1 TABLET(500 MG) BY MOUTH TWICE DAILY WITH A MEAL 09/22/17  Yes Nida, Marella Chimes, MD  milk thistle 175 MG tablet Take 175 mg by mouth daily.   Yes [provider]  OLANZapine (ZYPREXA) 5 MG tablet Take 5 mg by mouth 2 (two) times daily.   Yes [provider]  ondansetron (ZOFRAN ODT) 8 MG disintegrating tablet Take 1  tablet (8 mg total) by mouth every 8 (eight) hours as needed for nausea or vomiting. 04/19/17  Yes Higgs, Mathis Dad, MD  OXALIPLATIN IV Inject into the vein.   Yes [provider]  pantoprazole (PROTONIX) 40 MG tablet Take 1 tablet (40 mg total) by mouth 2 (two) times daily before a meal. 09/13/17  Yes Walden Field A, NP  potassium chloride SA (K-DUR,KLOR-CON) 20 MEQ tablet Take 20 mEq by mouth daily.   Yes [provider]  prochlorperazine (COMPAZINE) 10 MG tablet Take 1 tablet (10 mg total) by mouth every 6 (six) hours as needed for nausea or vomiting. 11/19/17  Yes Derek Jack, MD  rosuvastatin (CRESTOR) 10 MG tablet Take 1 tablet (10 mg total) by mouth daily. 09/20/17  Yes Herminio Commons, MD  vitamin C (ASCORBIC ACID) 250 MG tablet Take by mouth.   Yes [provider]  acetaminophen (TYLENOL) 325 MG tablet Take 650 mg by mouth every 6 (six) hours as needed for  moderate pain.     [provider]  B-D UF III MINI PEN NEEDLES 31G X 5 MM MISC  12/21/17   [provider]  Blood Glucose Monitoring Suppl (ONETOUCH VERIO) w/Device KIT 1 each by Does not apply route as needed. 05/27/17   Cassandria Anger, MD  fluorouracil CALGB 95188 in sodium chloride 0.9 % 150 mL Inject into the vein continuous. Over 46 hours    [provider]  glucose blood test strip 1 each by Other route 4 (four) times daily. Use as instructed qid One Touch ultra 06/22/17   Cassandria Anger, MD    Family History Family History  Problem Relation Age of Onset  . Hypertension Mother   . Dementia Mother   . Cancer Mother        breast  . Colon cancer Neg Hx   . Colon polyps Neg Hx     Social History Social History   Tobacco Use  . Smoking status: Never Smoker  . Smokeless tobacco: Never Used  Substance Use Topics  . Alcohol use: No  . Drug use: No     Allergies   Codeine and Morphine and related   Review of Systems Review of Systems  All other systems reviewed and are negative.    Physical Exam Updated Vital Signs BP 114/77   Pulse 90   Temp (!) 97.4 F (36.3 C) (Oral)   Resp 18   SpO2 93%   Physical Exam  Constitutional: No distress.  HENT:  Head: Normocephalic and atraumatic.  Right Ear: External ear normal.  Left Ear: External ear normal.  Eyes: Conjunctivae are normal. Right eye exhibits no discharge. Left eye exhibits no discharge. No scleral icterus.  Neck: Neck supple. No tracheal deviation present.  Cardiovascular: Normal rate, regular rhythm and intact distal pulses.  Pulmonary/Chest: Effort normal and breath sounds normal. No stridor. No respiratory distress. He has no wheezes. He has no rales.  Abdominal: Soft. Bowel sounds are normal. He exhibits no distension. There is no tenderness. There is no rebound and no guarding.  Musculoskeletal: He exhibits no edema or tenderness.  Neurological: He is alert. He has  normal strength. No cranial nerve deficit (no facial droop, extraocular movements intact, no slurred speech) or sensory deficit. He exhibits normal muscle tone. He displays no seizure activity. Coordination normal.  Skin: Skin is warm and dry. No rash noted. He is not diaphoretic.  Psychiatric: He has a normal mood and affect.  Nursing note  and vitals reviewed.    ED Treatments / Results  Labs (all labs ordered are listed, but only abnormal results are displayed) Labs Reviewed  BASIC METABOLIC PANEL - Abnormal; Notable for the following components:      Result Value   Sodium 129 (*)    Potassium 5.6 (*)    Chloride 95 (*)    Glucose, Bld 717 (*)    BUN 32 (*)    Calcium 8.6 (*)    All other components within normal limits  CBC - Abnormal; Notable for the following components:   Platelets 74 (*)    All other components within normal limits  URINALYSIS, ROUTINE W REFLEX MICROSCOPIC - Abnormal; Notable for the following components:   Color, Urine STRAW (*)    Glucose, UA >=500 (*)    Ketones, ur 5 (*)    All other components within normal limits  CBG MONITORING, ED - Abnormal; Notable for the following components:   Glucose-Capillary >600 (*)    All other components within normal limits  CBG MONITORING, ED - Abnormal; Notable for the following components:   Glucose-Capillary >600 (*)    All other components within normal limits  PROTIME-INR  BLOOD GAS, VENOUS    Procedures .Critical Care Performed by: Dorie Rank, MD Authorized by: Dorie Rank, MD   Critical care provider statement:    Critical care time (minutes):  45   Critical care was time spent personally by me on the following activities:  Discussions with consultants, evaluation of patient's response to treatment, examination of patient, ordering and performing treatments and interventions, ordering and review of laboratory studies, ordering and review of radiographic studies, pulse oximetry, re-evaluation of patient's  condition, obtaining history from patient or surrogate and review of old charts   (including critical care time)  Medications Ordered in ED Medications  insulin regular, human (MYXREDLIN) 100 units/ 100 mL infusion (5.4 Units/hr Intravenous New Bag/Given 12/27/17 1238)  sodium chloride 0.9 % bolus 1,000 mL (1,000 mLs Intravenous New Bag/Given 12/27/17 1212)    And  0.9 %  sodium chloride infusion ( Intravenous New Bag/Given 12/27/17 1241)     Initial Impression / Assessment and Plan / ED Course  I have reviewed the triage vital signs and the nursing notes.  Pertinent labs & imaging results that were available during my care of the patient were reviewed by me and considered in my medical decision making (see chart for details).  Clinical Course as of Dec 28 1350  Mon Dec 27, 2017  1317 Patient's laboratory tests are notable for severe hyperglycemia.  No signs of acidosis.  Patient has been started on IV fluids.  IV insulin infusion has also been initiated.  Plan on admission for further treatment.   [JK]    Clinical Course User Index [JK] Dorie Rank, MD   Pt presents to the ED with severe hyperglycemia.  No signs of DKA but his glucose is greater than 700.  Patient has been started on IV fluids and insulin.  I will consult with the medical service for admission and further treatment.  Family members are also concerned about his social situation.  He lives at home and has not been able to follow a proper diet or maintain his blood sugar.  He does have a outpatient social worker but they think he may need placement.  I will consult social work here  Final Clinical Impressions(s) / ED Diagnoses   Final diagnoses:  Hyperglycemia      Tomi Bamberger,  Wille Glaser, MD 12/27/17 1352 Cc addendum   Dorie Rank, MD 01/12/18 2110

## 2017-12-27 NOTE — ED Notes (Signed)
Date and time results received: 12/27/17 12:16 PM (use smartphrase ".now" to insert current time)  Test: Glucose Critical Value: 717  Name of Provider Notified: Tomi Bamberger  Orders Received? Or Actions Taken?: Orders Received - See Orders for details

## 2017-12-27 NOTE — Telephone Encounter (Signed)
Called to let Langley Gauss know that Laurice's labs were OK to resume his Metformin, however his Glucose level was 608. I have encouraged her to take him and have that managed, she replied that she is already on her way to the ED as instructed by Forestine Na.   Mont Dutton R.T(R)(T) Special Procedures Navigator

## 2017-12-27 NOTE — Telephone Encounter (Signed)
No los per 11/8 °

## 2017-12-27 NOTE — Clinical Social Work Note (Signed)
Clinical Social Work Assessment  Patient Details  Name: [name] MRN: [id number] Date of Birth: 12/07/1950  Date of referral:  12/27/17               Reason for consult:  Facility Placement, Discharge Planning                Permission sought to share information with:  Other(Healthcare POA) Permission granted to share information::  Yes, Verbal Permission Granted  Name::     [name] [phone]  Agency::     Relationship::     Contact Information:     Housing/Transportation Living arrangements for the past 2 months:  [place] of Information:  Patient, Power of Attorney Patient Interpreter Needed:  None Criminal Activity/Legal Involvement Pertinent to Current Situation/Hospitalization:  No - Comment as needed Significant Relationships:  Other(Comment)(POA) Lives with:  Self Do you feel safe going back to the place where you live?  No Need for family participation in patient care:  Yes (Comment)  Care giving concerns: POA states pt is unable to care for self at home.  [facility] been providing [facility] in home3, and are recommending SNF.   Social Worker assessment / plan:  Pt currently unable to care for self.  Will coordinate with [facility] and [facility] Social worker  Employment status:  Disabled (Comment on whether or not currently receiving Disability) Insurance information:  Medicare, Medicaid In [place] PT Recommendations:  Not assessed at this time Information / Referral to community resources:  [place]  Patient/Family's Response to care:  [place]  Patient/Family's Understanding of and Emotional Response to Diagnosis, Current Treatment, and Prognosis:  Understanding and welcoming  Emotional Assessment Appearance:  Appears older than stated age, Disheveled Attitude/Demeanor/Rapport:  Lethargic Affect (typically observed):  Flat, Constricted Orientation:    Alcohol / Substance use:  Not Applicable Psych  involvement (Current and /or in the community):  Yes (Comment)  Discharge Needs  Concerns to be addressed:  Discharge Planning Concerns, Home Safety Concerns Readmission within the last 30 days:    Current discharge risk:    Barriers to Discharge:  Unsafe home situation, No SNF bed, Insurance Authorization   [place], LCSW 12/27/2017, 3:44 PM

## 2017-12-27 NOTE — Progress Notes (Unsigned)
CRITICAL VALUE ALERT Critical value received:  Glucose 608 Date of notification:  12/27/17 Time of notification: 2563 Critical value read back:  Yes.   Nurse who received alert:  Isidoro Donning RN Dr. Raliegh Ip notified. Send patient to ER. Patient's caregiver said she would bring him to the ER. She also stated that he fell at home and is weak.

## 2017-12-27 NOTE — Progress Notes (Signed)
Lactic acid of 2.4 called by lab. Midlevel provider notified via text page

## 2017-12-27 NOTE — ED Triage Notes (Signed)
Pt had routine blood work while he was getting fitted for radiation mask, was called d/t glucose over 600. Increased urination, off balance, increased thirst. Per family 400-500 cbg for several months. Prior to arrival pt fell back and hit his head, denies blood thinner use. Stage 4 liver and esophageal CA with mets to brain. Two weeks had chemo.

## 2017-12-27 NOTE — H&P (Signed)
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  Patient has been getting daily p.o. steroids for his brain metastasis and his blood sugar had been elevated for several months he was put on medication for it.  Today he got a routine blood work at his primary care provider follow-up and was found to have blood sugar in the 600s and told to come to the emergency department for evaluation.  He endorses frequent urination and thirst.  Patient lives at home at alone and he probably not following proper diet for his sugars he has an outpatient social worker who is working on placement    ED Course: Diagnosis of hyperglycemia without DKA was done his sugar was greater than 700 IV insulin drip was started , 2000 mils bolus of normal saline given.  Social worker consult was also made and the ED due to the fact that he might need placement  Review of Systems:  Positive for frequent urination fatigue weakness blurry vision Negative for fever, visual changes, sore throat, rash, new muscle aches, chest pain, SOB, dysuria, bleeding, n/v/abdominal pain.  Past Medical History:  Diagnosis Date  . Arthritis   . Bipolar disorder ([place])   . Brain cancer ([place]) 2019  . CAD (coronary artery disease) 08/16/2016  . Cancer ([place])    esophageal with liver mets  . Cellulitis and abscess of foot 03/05/2016  . Chronic combined systolic and diastolic heart failure ([place])   . Diabetes mellitus without  complication ([place])    boderline  . GERD (gastroesophageal reflux disease)   . Heart murmur 1995  . History of kidney stones   . Hypercholesteremia   . Hypertension   . Kidney stones   . Liver cancer ([place]) 2019  . Myocardial infarction ([place])   . OSA (obstructive sleep apnea)    no cpap, can't tolerate  . Schizoaffective disorder, bipolar type ([place]) 04/25/2016    Past Surgical History:  Procedure Laterality Date  . APPENDECTOMY    . BIOPSY  03/09/2017   Procedure: BIOPSY;  Surgeon: Danie Binder, MD;  [facility];  Service: Endoscopy;;  gastric esophageal  . BIOPSY  11/23/2017   Procedure: BIOPSY;  Surgeon: Danie Binder, MD;  [facility];  Service: Endoscopy;;  esophagus  . CHOLECYSTECTOMY N/A 10/20/2013   Procedure: LAPAROSCOPIC CHOLECYSTECTOMY;  Surgeon: [name], MD;  Location: [facility];  Service: General;  Laterality: N/A;  . COLONOSCOPY WITH PROPOFOL N/A 03/09/2017   Procedure: COLONOSCOPY WITH PROPOFOL;  Surgeon: Danie Binder, MD;  [facility];  Service: Endoscopy;  Laterality: N/A;  12:15pm  . ESOPHAGOGASTRODUODENOSCOPY (EGD) WITH PROPOFOL N/A 03/09/2017   Procedure: ESOPHAGOGASTRODUODENOSCOPY (EGD) WITH PROPOFOL;  Surgeon: Danie Binder, MD;  [facility];  Service: Endoscopy;  Laterality: N/A;  . ESOPHAGOGASTRODUODENOSCOPY (EGD) WITH PROPOFOL N/A 11/23/2017   Procedure: ESOPHAGOGASTRODUODENOSCOPY (EGD) WITH PROPOFOL;  Surgeon: Danie Binder, MD;  [facility];  Service: Endoscopy;  Laterality: N/A;  10:00am  . EUS N/A 04/08/2017   Procedure: UPPER ENDOSCOPIC  ULTRASOUND (EUS) RADIAL;  Surgeon: Milus Banister, MD;  [facility]: [facility];  Service: Endoscopy;  Laterality: N/A;  . HIP SURGERY Left   . KNEE SURGERY Left   . POLYPECTOMY  03/09/2017   Procedure: POLYPECTOMY;  Surgeon: Danie Binder, MD;  [facility];  Service: Endoscopy;;  colon   . PORTACATH PLACEMENT Left 04/07/2017   Procedure:  INSERTION PORT-A-CATH LEFT SUBCLAVIAN;  Surgeon: Virl Cagey, MD;  Location: [facility];  Service: General;  Laterality: Left;  . SAVORY DILATION N/A 03/09/2017   Procedure: SAVORY DILATION;  Surgeon: Danie Binder, MD;  [facility];  Service: Endoscopy;  Laterality: N/A;     reports that he has never smoked. He has never used smokeless tobacco. He reports that he does not drink alcohol or use drugs. Mobility:   Allergies  Allergen Reactions  . Codeine Nausea And Vomiting  . Morphine And Related Nausea And Vomiting    Family History  Problem Relation Age of Onset  . Hypertension Mother   . Dementia Mother   . Cancer Mother        breast  . Colon cancer Neg Hx   . Colon polyps Neg Hx      Prior to Admission medications   Medication Sig Start Date End Date Taking? Authorizing Provider  AMITIZA 24 MCG capsule Take 24 mcg by mouth 2 (two) times a week.  08/06/16  Yes [provider]  carvedilol (COREG) 12.5 MG tablet TAKE 1 TABLET(12.5 MG) BY MOUTH TWICE DAILY Patient taking differently: Take 12.5 mg by mouth 2 (two) times daily with a meal.  09/02/17  Yes Lendon Colonel, NP  dexamethasone (DECADRON) 4 MG tablet Take 1 tablet (4 mg total) by mouth 2 (two) times daily. 12/10/17  Yes Hayden Pedro, PA-C  ENTRESTO 49-51 MG TAKE 1 TABLET BY MOUTH TWICE DAILY Patient taking differently: Take 1 tablet by mouth 2 (two) times daily.  07/21/17  Yes Herminio Commons, MD  furosemide (LASIX) 40 MG tablet Alternate 40 mg one day and 20 mg the next Patient taking differently: Take 40 mg by mouth daily.  07/17/16  Yes Herminio Commons, MD  glipiZIDE (GLUCOTROL XL) 10 MG 24 hr tablet Take 1 tablet (10 mg total) by mouth daily with breakfast. 12/10/17  Yes Nida, Marella Chimes, MD  Insulin Glargine (BASAGLAR KWIKPEN) 100 UNIT/ML SOPN 12 Units daily. 12/22/17  Yes [provider]  lidocaine-prilocaine (EMLA) cream Apply small amount over port site and  cover with plastic wrap one hour prior to appointment. 11/19/17  Yes Derek Jack, MD  linagliptin (TRADJENTA) 5 MG TABS tablet Take 5 mg by mouth daily.   Yes [provider]  metFORMIN (GLUCOPHAGE) 500 MG tablet TAKE 1 TABLET(500 MG) BY MOUTH TWICE DAILY WITH A MEAL 09/22/17  Yes Nida, Marella Chimes, MD  milk thistle 175 MG tablet Take 175 mg by mouth daily.   Yes [provider]  OLANZapine (ZYPREXA) 5 MG tablet Take 5 mg by mouth 2 (two) times daily.   Yes [provider]  ondansetron (ZOFRAN ODT) 8 MG disintegrating tablet Take 1 tablet (8 mg total) by mouth every 8 (eight) hours as needed for nausea or vomiting. 04/19/17  Yes Higgs, Mathis Dad, MD  OXALIPLATIN IV Inject into the vein.   Yes [provider]  pantoprazole (PROTONIX) 40 MG tablet Take 1 tablet (40 mg total) by mouth 2 (two) times daily before a meal. 09/13/17  Yes Gordy Levan,  Eric A, NP  potassium chloride SA (K-DUR,KLOR-CON) 20 MEQ tablet Take 20 mEq by mouth daily.   Yes [provider]  prochlorperazine (COMPAZINE) 10 MG tablet Take 1 tablet (10 mg total) by mouth every 6 (six) hours as needed for nausea or vomiting. 11/19/17  Yes Derek Jack, MD  rosuvastatin (CRESTOR) 10 MG tablet Take 1 tablet (10 mg total) by mouth daily. 09/20/17  Yes Herminio Commons, MD  vitamin C (ASCORBIC ACID) 250 MG tablet Take by mouth.   Yes [provider]  acetaminophen (TYLENOL) 325 MG tablet Take 650 mg by mouth every 6 (six) hours as needed for moderate pain.     [provider]  B-D UF III MINI PEN NEEDLES 31G X 5 MM MISC  12/21/17   [provider]  Blood Glucose Monitoring Suppl (ONETOUCH VERIO) w/Device KIT 1 each by Does not apply route as needed. 05/27/17   Cassandria Anger, MD  fluorouracil CALGB 35701 in sodium chloride 0.9 % 150 mL Inject into the vein continuous. Over 46 hours    [provider]  glucose blood test strip 1 each by Other route 4  (four) times daily. Use as instructed qid One Touch ultra 06/22/17   Cassandria Anger, MD    Physical Exam: Vitals:   12/27/17 1700 12/27/17 1721  BP:    Pulse: 83   Resp: 18   Temp:  97.8 F (36.6 C)  SpO2: 95%     Constitutional:   . Appears calm and comfortable Eyes:  . pupils and irises appear normal . Normal lids and conjunctivae ENMT:  . grossly normal hearing  . Lips appear normal . external ears, nose appear normal . Oropharynx: mucosa, tongue,posterior pharynx appear normal Neck:  . neck appears normal, no masses, normal ROM, supple . no thyromegaly Respiratory:  . CTA bilaterally, no w/r/r.  . Respiratory effort normal. No retractions or accessory muscle use Cardiovascular:  . RRR, no m/r/g . No LE extremity edema   . Normal pedal pulses Abdomen:  . Abdomen appears normal; no tenderness or masses . No hernias . No HSM Musculoskeletal:  . Digits/nails BUE: no clubbing, cyanosis, petechiae, infection . exam of joints, bones, muscles of at least one of following: head/neck, RUE, LUE, RLE, LLE   o strength and tone normal, no atrophy, no abnormal movements o No tenderness, masses o Normal ROM, no contractures  . gait and station Skin:  . No rashes, lesions, ulcers . palpation of skin: no induration or nodules Neurologic:  . CN 2-12 intact . Sensation all 4 extremities intact Psychiatric:  . Mental status o Mood, affect appropriate o Orientation to person, place, time  . judgment and insight appear intact    I have personally reviewed following labs and imaging studies  Labs:  Results for [name], [name] (MRN [id number]) as of 12/27/2017 21:23  Ref. Range 12/27/2017 15:09 12/27/2017 15:37 12/27/2017 16:23 12/27/2017 18:25  Sample type Unknown  VENOUS    FIO2 Unknown  0.21    pH, Ven Latest Ref Range: 7.250 - 7.430   7.429    pCO2, Ven Latest Ref Range: 44.0 - 60.0 mmHg  39.5 (L)    pO2, Ven Latest Ref Range: 32.0 - 45.0 mmHg  62.8 (H)     Acid-Base Excess Latest Ref Range: 0.0 - 2.0 mmol/L  1.7    Bicarbonate Latest Ref Range: 20.0 - 28.0 mmol/L  25.9    O2 Saturation Latest Units: %  91.7  Patient temperature Unknown  37.0    Collection site Unknown  VENOUS    Lactic Acid, Venous Latest Ref Range: 0.5 - 1.9 mmol/L    2.4 [facility])      Results for [name], [name] (MRN [id number]) as of 12/27/2017 21:23  Ref. Range 12/27/2017 67:61  BASIC METABOLIC PANEL Unknown Rpt (A)  Sodium Latest Ref Range: 135 - 145 mmol/L 129 (L)  Potassium Latest Ref Range: 3.5 - 5.1 mmol/L 5.6 (H)  Chloride Latest Ref Range: 98 - 111 mmol/L 95 (L)  CO2 Latest Ref Range: 22 - 32 mmol/L 23  Glucose Latest Ref Range: 70 - 99 mg/dL 717 (HH)  BUN Latest Ref Range: 8 - 23 mg/dL 32 (H)  Creatinine Latest Ref Range: 0.61 - 1.24 mg/dL 1.08  Calcium Latest Ref Range: 8.9 - 10.3 mg/dL 8.6 (L)  Anion gap Latest Ref Range: 5 - 15  11  GFR, Est Non African American Latest Ref Range: >60 mL/min >60  GFR, Est African American Latest Ref Range: >60 mL/min >60  WBC Latest Ref Range: 4.0 - 10.5 K/uL 6.8  RBC Latest Ref Range: 4.22 - 5.81 MIL/uL 5.04  Hemoglobin Latest Ref Range: 13.0 - 17.0 g/dL 15.1  HCT Latest Ref Range: 39.0 - 52.0 % 44.3  MCV Latest Ref Range: 80.0 - 100.0 fL 87.9  MCH Latest Ref Range: 26.0 - 34.0 pg 30.0  MCHC Latest Ref Range: 30.0 - 36.0 g/dL 34.1  RDW Latest Ref Range: 11.5 - 15.5 % 14.7  Platelets Latest Ref Range: 150 - 400 K/uL 74 (L)  nRBC Latest Ref Range: 0.0 - 0.2 % 0.0   Imaging studies:      Medical tests:   EKG independently reviewed:     Test discussed with performing physician:   yes  Decision to obtain old records:      Review and summation of old records:      Active Problems:   DM type 2 causing vascular disease ([place])   Essential hypertension, benign   Acute on chronic diastolic heart failure (H[facility])   Schizoaffective disorder, bipolar type ([facility])   CAD (coronary artery disease)   Acute  renal failure superimposed on stage 3 chronic kidney disease (H[facility])   Dysphagia   Malignant neoplasm of cardio-esophageal junction (HCC)   Malignant neoplasm of esophagus (H[facility])   Brain metastasis (H[facility])   Hyperglycemia without ketosis   Assessment/Plan  #1 hyperglycemia with no ketosis 2.  Recent esophageal cancer with metastasis to liver and brain patient is on once a week chemotherapy is not due yet 3.Coronary artery disease 4.History of hypertension continue home meds  5.type 2 diabetes mellitus Metformin is on hold as well as Lantus due to being on insulin drip 6.Hyponatremia continue IV fluids  We will admit to ICU due to insulin drip IV hydration Social worker consult is pending   Severity of Illness: The appropriate patient status for this patient is OBSERVATION. Observation status is judged to be reasonable and necessary in order to provide the required intensity of service to ensure the patient's safety. The patient's presenting symptoms, physical exam findings, and initial radiographic and laboratory data in the context of their medical condition is felt to place them at decreased risk for further clinical deterioration. Furthermore, it is anticipated that the patient will be medically stable for discharge from the hospital within 2 midnights of admission. The following factors support the patient status of observation.   " The patient's presenting symptoms include hyperglycemia. "  The physical exam findings include elevated blood sugar " The initial radiographic and laboratory data are elevated blood sugar      DVT prophylaxis: SCD no Lovenox due to low platelets Code Status: Full Family Communication: Power of attorney cousin at bedside Ms. [facility] Consults called: None  Time spent: 60 minutes minutes  Dr. Kyung Bacca [facility] Pager [phone]  12/27/2017, 9:07 PM

## 2017-12-28 DIAGNOSIS — Z7189 Other specified counseling: Secondary | ICD-10-CM

## 2017-12-28 DIAGNOSIS — C159 Malignant neoplasm of esophagus, unspecified: Secondary | ICD-10-CM

## 2017-12-28 DIAGNOSIS — C7931 Secondary malignant neoplasm of brain: Secondary | ICD-10-CM | POA: Diagnosis not present

## 2017-12-28 DIAGNOSIS — Z66 Do not resuscitate: Secondary | ICD-10-CM

## 2017-12-28 DIAGNOSIS — Z515 Encounter for palliative care: Secondary | ICD-10-CM

## 2017-12-28 LAB — COMPREHENSIVE METABOLIC PANEL
ALT: 31 U/L (ref 0–44)
ANION GAP: 8 (ref 5–15)
AST: 29 U/L (ref 15–41)
Albumin: 2.5 g/dL — ABNORMAL LOW (ref 3.5–5.0)
Alkaline Phosphatase: 89 U/L (ref 38–126)
BUN: 21 mg/dL (ref 8–23)
CALCIUM: 7.2 mg/dL — AB (ref 8.9–10.3)
CHLORIDE: 103 mmol/L (ref 98–111)
CO2: 26 mmol/L (ref 22–32)
CREATININE: 0.81 mg/dL (ref 0.61–1.24)
Glucose, Bld: 261 mg/dL — ABNORMAL HIGH (ref 70–99)
Potassium: 3.9 mmol/L (ref 3.5–5.1)
SODIUM: 137 mmol/L (ref 135–145)
Total Bilirubin: 0.8 mg/dL (ref 0.3–1.2)
Total Protein: 4.6 g/dL — ABNORMAL LOW (ref 6.5–8.1)

## 2017-12-28 LAB — GLUCOSE, CAPILLARY
GLUCOSE-CAPILLARY: 230 mg/dL — AB (ref 70–99)
GLUCOSE-CAPILLARY: 397 mg/dL — AB (ref 70–99)
GLUCOSE-CAPILLARY: 199 mg/dL — AB (ref 70–99)
Glucose-Capillary: 256 mg/dL — ABNORMAL HIGH (ref 70–99)
Glucose-Capillary: 412 mg/dL — ABNORMAL HIGH (ref 70–99)
Glucose-Capillary: 501 mg/dL (ref 70–99)
Glucose-Capillary: 171 mg/dL — ABNORMAL HIGH (ref 70–99)

## 2017-12-28 LAB — HIV ANTIBODY (ROUTINE TESTING W REFLEX): HIV SCREEN 4TH GENERATION: NONREACTIVE

## 2017-12-28 MED ORDER — INSULIN GLARGINE 100 UNIT/ML ~~LOC~~ SOLN
20.0000 [IU] | Freq: Every day | SUBCUTANEOUS | Status: DC
  Filled 2017-12-28: qty 0.2

## 2017-12-28 MED ORDER — GABAPENTIN 100 MG PO CAPS
100.0000 mg | ORAL_CAPSULE | Freq: Two times a day (BID) | ORAL | Status: DC
  Administered 2017-12-28 – 2018-01-01 (×9): 100 mg via ORAL
  Filled 2017-12-28 (×9): qty 1

## 2017-12-28 MED ORDER — SENNA 8.6 MG PO TABS: 1.0000 | ORAL_TABLET | Freq: Every evening | ORAL | Status: DC | PRN

## 2017-12-28 MED ORDER — INSULIN GLARGINE 100 UNIT/ML ~~LOC~~ SOLN
25.0000 [IU] | Freq: Every day | SUBCUTANEOUS | Status: DC
  Administered 2017-12-28: 25 [IU] via SUBCUTANEOUS
  Filled 2017-12-28 (×2): qty 0.25

## 2017-12-28 MED ORDER — INSULIN ASPART 100 UNIT/ML ~~LOC~~ SOLN
0.0000 [IU] | Freq: Every day | SUBCUTANEOUS | Status: DC
  Administered 2017-12-29 – 2017-12-30 (×2): 3 [IU] via SUBCUTANEOUS
  Administered 2017-12-31: 4 [IU] via SUBCUTANEOUS

## 2017-12-28 MED ORDER — INSULIN ASPART 100 UNIT/ML ~~LOC~~ SOLN
0.0000 [IU] | Freq: Three times a day (TID) | SUBCUTANEOUS | Status: DC
  Administered 2017-12-28: 25 [IU] via SUBCUTANEOUS
  Administered 2017-12-28: 20 [IU] via SUBCUTANEOUS
  Administered 2017-12-28: 4 [IU] via SUBCUTANEOUS
  Administered 2017-12-29: 20 [IU] via SUBCUTANEOUS
  Administered 2017-12-29: 7 [IU] via SUBCUTANEOUS
  Administered 2017-12-29: 20 [IU] via SUBCUTANEOUS
  Administered 2017-12-30: 11 [IU] via SUBCUTANEOUS
  Administered 2017-12-30: 15 [IU] via SUBCUTANEOUS
  Administered 2017-12-30: 11 [IU] via SUBCUTANEOUS
  Administered 2017-12-31: 7 [IU] via SUBCUTANEOUS
  Administered 2017-12-31: 4 [IU] via SUBCUTANEOUS
  Administered 2017-12-31 – 2018-01-01 (×3): 11 [IU] via SUBCUTANEOUS

## 2017-12-28 MED ORDER — SUCRALFATE 1 GM/10ML PO SUSP
1.0000 g | Freq: Three times a day (TID) | ORAL | Status: DC
  Administered 2017-12-28 – 2017-12-31 (×11): 1 g via ORAL
  Filled 2017-12-28 (×11): qty 10

## 2017-12-28 NOTE — Progress Notes (Addendum)
Initial Nutrition Assessment  DOCUMENTATION CODES:      INTERVENTION:  Cho Modified diet   Snack q hs    NUTRITION DIAGNOSIS:   Increased nutrient needs (energy and protein) related to metastatic cancer and cancer related treatments as evidenced by estimated needs.   GOAL:   Patient will meet greater than or equal to 90% of their needs   MONITOR:   PO intake, Supplement acceptance, Weight trends. Follow outcome of GOC discussion.   REASON FOR ASSESSMENT:   Malnutrition Screening Tool    ASSESSMENT: Patient is a 67 yo with malignant neoplasm of esophagus and metastasis (brain and liver). Radiation treatments. Dehydrated at last weeks oncology visit and received 1 liter of normal saline.  He presents from home with hyperglycemia. His blood glucose had improved with this mornings labs but has increased again. Insulin adjusted.  CBG (last 3)  Recent Labs    12/28/17 0813 12/28/17 1149 12/28/17 1307  GLUCAP 256* 412* 501*     Patient has a [place] who is concerned for pt ability to function at home alone. Palliative medicine consult pending.  Patient says he only eats one meal daily -breakfast and 2 snacks. He likes pudding with vanilla wafers as snack. Question patients ability to choose and prepare his own meals. Patient says he was going out to breakfast but his keys were taken away.  Patient reports history of weight loss-he is down from  85.2 kg Sept 12 th to current 80.2 kg 5.9% in 2 months. Expect pt weight loss is related to inadequate energy and protein intake based on the diet hx he provided and his dehydrated state at oncology follow up.  Medications reviewed and include: decadron, lasix, coreg, SSI, Lantus (25 units), Protonix, Crestor, Carafate.   Last BM reported as 11/6.   Labs: BMP Latest Ref Rng & Units 12/28/2017 12/27/2017 12/27/2017  Glucose 70 - 99 mg/dL 261(H) 717(HH) 608(HH)  BUN 8 - 23 mg/dL 21 32(H) 33(H)  Creatinine 0.61 - 1.24 mg/dL  0.81 1.08 1.06  BUN/Creat Ratio 6 - 22 (calc) - - -  Sodium 135 - 145 mmol/L 137 129(L) 129(L)  Potassium 3.5 - 5.1 mmol/L 3.9 5.6(H) 5.2(H)  Chloride 98 - 111 mmol/L 103 95(L) 97(L)  CO2 22 - 32 mmol/L 26 23 22   Calcium 8.9 - 10.3 mg/dL 7.2(L) 8.6(L) 8.3(L)    NUTRITION - FOCUSED PHYSICAL EXAM:    Most Recent Value  Orbital Region  No depletion  Upper Arm Region  No depletion  Thoracic and Lumbar Region  No depletion  Buccal Region  No depletion  Temple Region  Mild depletion  Clavicle Bone Region  Mild depletion  Dorsal Hand  No depletion  Hair  Reviewed  Skin  Reviewed      Diet Order:   Diet Order            Diet Carb Modified Fluid consistency: Thin; Room service appropriate? Yes  Diet effective now              EDUCATION NEEDS:   Not appropriate for education at this time Skin:  Skin Assessment: Reviewed RN Assessment  Last BM:  11/6  Height:   Ht Readings from Last 1 Encounters:  12/27/17 5\' 4"  (1.626 m)    Weight:   Wt Readings from Last 1 Encounters:  12/28/17 81.6 kg    Ideal Body Weight:  59 kg  BMI:  Body mass index is 30.88 kg/m.  Estimated Nutritional Needs:  Kcal:  1800-2100   Protein:  77-83 gr  Fluid:  1.8-2.1 liters daily   Colman Cater MS,RD,CSG,LDN Office: [phone] Pager: [phone]

## 2017-12-28 NOTE — Care Management (Addendum)
Patient is active with [facility] for nursing, SW and PT.

## 2017-12-28 NOTE — Consult Note (Signed)
Consultation Note Date: 12/28/2017   Patient Name: [name]  DOB: 01-May-1950  MRN: [id number]  Age / Sex: 67 y.o., male  PCP: Sinda Du, MD Referring Physician: Sinda Du, MD  Reason for Consultation: Establishing goals of care  HPI/Patient Profile: 67 y.o. 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   Clinical Assessment and Goals of Care: I met today initially with [name] who is [name]'s support and POA since 2016/2017 when his mother was no longer able to care for him. Denies and her husband have been helping [name] manage at home and she manages his medications for him as well. He is unable to clean or cook as he was cared for by his father and then his mother and he never learned how to care for himself. These lack of skills have made his care increasingly challenging with his worsening health conditions. [name] has excellent understanding of [name]'s metastatic cancer and poor prognosis. [name] wants [name] to be happy and comfortable. She does not want him to suffer at the end of his life.   [name] and I join Agricultural consultant at bedside. [name] is enjoying his spaghetti and broccoli (eats 100% and broccoli is his favorite food). [name] shares with me that he enjoys television and especially country music. He loves Lou Miner. When I ask [name] about what the doctors have been telling him he says "I don't really know, I don't remember." [name] says he listens but doesn't always remember. I gently reminded him about his cancer and that this is not good. Explained the goal of  treatment is to maintain and that the cancer will not go away. [name] has enough insight to share that "everyone is born to die." I acknowledge that this can be scary to think about. I explained to [name] that when he reaches the end of his life that we would hate to see him put through CPR, shock, tubes and breathing machines when this will not fix his cancer. [name] agrees that if this will "not bring me back" that he would not want this either. DNR established with patient and HCPOA at bedside.   We also discussed his pain in "chest" and "stomach" although both of these seem in low mid chest and epigastric area and he describes as shooting and burning. Says this pain does radiate. Sounds like a neuropathic component of his cancer. Consider gabapentin and agree with carafate already ordered as this seems to be worse with swallowing. Be careful with carafate to space out from other po medications 2 hours to allow absorption especially of psych meds and decadron.   Discussed plan with Dr. Luan Pulling.   Primary Decision Maker PATIENT    SUMMARY OF RECOMMENDATIONS   -  DNR decided - Will need placement, CSW consulted (there are plans for chemotherapy which could complicate placement) - Provided with HCPOA and he desires cousin [name] (she has durable POA but no documented HCPOA) and to be notarized prior to discharge - Will need to follow up with oncology for [facility] and chemotherapy  Code Status/Advance Care Planning:  DNR   Symptom Management:   Chest and stomach pain likely a result from distal esophageal wall thickening noted on CT chest. Recommend continue decadron. Carafate added to assist with comfort at meals. No evidence of thrush. Neuropathic component?? Adding gabapentin 100 mg po BID.   LBM?? Senokot daily prn.   Palliative Prophylaxis:   Aspiration, Bowel Regimen, Delirium Protocol and Frequent Pain Assessment  Additional Recommendations (Limitations, Scope, Preferences):  Full Scope  Treatment  Psycho-social/Spiritual:   Desire for further Chaplaincy support:yes  Additional Recommendations: Caregiving  Support/Resources and Grief/Bereavement Support  Prognosis:   Overall prognosis poor with advanced adenocarcinoma of the esophagus with newly diagnosed mets to liver and brain. Treatment has been delayed d/t complications from poorly controlled diabetes. Not eligible for hospice at this time given desire to pursue treatment. If treatment is stopped for poor tolerance or GOC in the future then he would be eligible for hospice services.   Discharge Planning: CSW to assist with placement. Likely needs SNF with palliative.      Primary Diagnoses: Present on Admission: . Schizoaffective disorder, bipolar type ([place]) . DM type 2 causing vascular disease ([place]) . Essential hypertension, benign . Acute on chronic diastolic heart failure ([place]) . CAD (coronary artery disease) . Acute renal failure superimposed on stage 3 chronic kidney disease ([place]) . Malignant neoplasm of esophagus ([place]) . Malignant neoplasm of cardio-esophageal junction ([place]) . Brain metastasis ([place]) . Hyperglycemia without ketosis   I have reviewed the medical record, interviewed the patient and family, and examined the patient. The following aspects are pertinent.  Past Medical History:  Diagnosis Date  . Arthritis   . Bipolar disorder ([place])   . Brain cancer ([place]) 2019  . CAD (coronary artery disease) 08/16/2016  . Cancer ([place])    esophageal with liver mets  . Cellulitis and abscess of foot 03/05/2016  . Chronic combined systolic and diastolic heart failure ([place])   . Diabetes mellitus without complication ([place])    boderline  . GERD (gastroesophageal reflux disease)   . Heart murmur 1995  . History of kidney stones   . Hypercholesteremia   . Hypertension   . Kidney stones   . Liver cancer ([place]) 2019  . Myocardial infarction ([place])   . OSA (obstructive sleep apnea)    no cpap, can't tolerate    . Schizoaffective disorder, bipolar type ([place]) 04/25/2016   Social History   Socioeconomic History  . Marital status: Divorced    Spouse name: Not on file  . Number of children: Not on file  . Years of education: Not on file  . Highest education level: Not on file  Occupational History  . Not on file  Social Needs  . Financial resource strain: Not on file  . Food insecurity:    Worry: Not on file    Inability: Not on file  . Transportation needs:    Medical: No    Non-medical: No  Tobacco Use  . Smoking status: Never Smoker  . Smokeless tobacco: Never Used  Substance and Sexual Activity  . Alcohol use: No  . Drug use: No  . Sexual activity: Not Currently  Birth control/protection: Implant  Lifestyle  . Physical activity:    Days per week: Not on file    Minutes per session: Not on file  . Stress: Not on file  Relationships  . Social connections:    Talks on phone: Not on file    Gets together: Not on file    Attends religious service: Not on file    Active member of club or organization: Not on file    Attends meetings of clubs or organizations: Not on file    Relationship status: Not on file  Other Topics Concern  . Not on file  Social History Narrative  . Not on file   Family History  Problem Relation Age of Onset  . Hypertension Mother   . Dementia Mother   . Cancer Mother        breast  . Colon cancer Neg Hx   . Colon polyps Neg Hx    Scheduled Meds: . carvedilol  12.5 mg Oral BID WC  . dexamethasone  4 mg Oral BID  . furosemide  40 mg Oral Daily  . insulin aspart  0-20 Units Subcutaneous TID WC  . insulin aspart  0-5 Units Subcutaneous QHS  . insulin glargine  20 Units Subcutaneous QHS  . lubiprostone  24 mcg Oral Once per day on Mon Thu  . OLANZapine  5 mg Oral BID  . pantoprazole  40 mg Oral BID AC  . rosuvastatin  10 mg Oral Daily  . sucralfate  1 g Oral TID WC & HS   Continuous Infusions: . sodium chloride 50 mL/hr at 12/28/17 0900    PRN Meds:.acetaminophen, fentaNYL (SUBLIMAZE) injection, ondansetron, prochlorperazine Allergies  Allergen Reactions  . Codeine Nausea And Vomiting  . Morphine And Related Nausea And Vomiting   Review of Systems  Constitutional: Positive for activity change and fatigue. Negative for appetite change.  Respiratory: Negative for shortness of breath.   Gastrointestinal:       Pain in area of distal esophagus  Neurological: Positive for dizziness and weakness.    Physical Exam  Constitutional: He is oriented to person, place, and time. He appears well-developed and well-nourished.  HENT:  Head: Normocephalic and atraumatic.  Cardiovascular: Normal rate.  Pulmonary/Chest: Effort normal. No accessory muscle usage. No tachypnea. No respiratory distress.  Abdominal: Soft. Normal appearance.  Neurological: He is alert and oriented to person, place, and time.  Oriented but overall poor insight and judgement. Does not recall information from previous conversations.   Nursing note and vitals reviewed.   Vital Signs: BP 140/84   Pulse 84   Temp 97.7 F (36.5 C) (Oral)   Resp 17   Ht 5' 4"  (1.626 m)   Wt 81.6 kg   SpO2 97%   BMI 30.88 kg/m  Pain Scale: 0-10   Pain Score: 0-No pain   SpO2: SpO2: 97 % O2 Device:SpO2: 97 % O2 Flow Rate: .   IO: Intake/output summary:   Intake/Output Summary (Last 24 hours) at 12/28/2017 1152 Last data filed at 12/28/2017 1000 Gross per 24 hour  Intake 3217.64 ml  Output 1900 ml  Net 1317.64 ml    LBM: Last BM Date: 12/22/17 Baseline Weight: Weight: 85.7 kg Most recent weight: Weight: 81.6 kg     Palliative Assessment/Data: 70%     Time In: 1145 Time Out: 1300 Time Total: 75 min Greater than 50%  of this time was spent counseling and coordinating care related to the above assessment and plan.  Signed by: Vinie Sill, NP Palliative Medicine Team Pager # [phone] (M-F 8a-5p) Team Phone # [phone] (Nights/Weekends)

## 2017-12-28 NOTE — Progress Notes (Signed)
Symptoms Management Clinic Progress Note   [name] [id number] [birth year] 67 y.o.  KYAL ARTS is managed by Dr. Delton Coombes  Actively treated with chemotherapy/immunotherapy: yes  Current Therapy: 5-FU and oxaliplatin with concurrent radiation therapy.  Assessment: Plan:    Malignant neoplasm of cardio-esophageal junction (HCC)  Brain metastasis (HCC)  Chest pain, unspecified type  Dehydration   Metastatic malignant neoplasm of the esophagus with brain metastasis: The patient is receiving radiation therapy.   Chest pain: An EKG returned showing sinus tachycardia at 100-minute with frequent premature ventricular complexes and fusion complexes, left axis deviation, septal infarct, age undetermined, and a possible lateral infarct, age undetermined.  This chest x-ray was compared to the patient's prior x-rays and appears to be stable with no acute findings.  Dehydration: The patient was sent from radiation oncology stating that he was dehydrated today.  Based on this he was given 1 L of IV normal saline.  Please see After Visit Summary for patient specific instructions.  Future Appointments  Date Time Provider [place]  12/30/2017 11:30 AM Kyung Rudd, MD [facility] None  01/12/2018  8:30 AM [facility] LAB [facility] None  01/12/2018  9:00 AM Derek Jack, MD [facility] None  01/12/2018  9:15 AM [facility] CHAIR 1 [facility] None  01/14/2018  1:00 PM [facility] INJ NURSE [facility] None  01/17/2018  9:00 AM Herminio Commons, MD [facility]  02/15/2018  1:30 PM Nida, Marella Chimes, MD [facility] None    No orders of the defined types were placed in this encounter.      Subjective:   Patient ID:  [name] is a 67 y.o. (DOB 17-Oct-1950) male.  Chief Complaint: No chief complaint on file.   HPI [name] is a 67 year old male with a history of a metastatic esophageal cancer who is [facility] at [facility] by Dr. Worthy Keeler.   He was being seen in radiation oncology today for radiation therapy.  He was sent to the symptom management clinic for consideration of fluids as it was reported patient was hydrated.  Was also reported that he was having chest pain.  He has a history of an MI dating to March 2019 which by his report was silent.  He was managed in [facility].  The patient is a poor historian and can not offer much additional information regarding his cardiac history.  He is diabetic.  He reported that he was nauseated while here today but continued to eat a sandwich and drank a drink despite this report.  He denies shortness of breath and is not diaphoretic.  Medications: I have reviewed the patient's current medications.  Allergies:  Allergies  Allergen Reactions  . Codeine Nausea And Vomiting  . Morphine And Related Nausea And Vomiting    Past Medical History:  Diagnosis Date  . Arthritis   . Bipolar disorder ([place])   . Brain cancer ([place]) 2019  . CAD (coronary artery disease) 08/16/2016  . Cancer ([place])    esophageal with liver mets  . Cellulitis and abscess of foot 03/05/2016  . Chronic combined systolic and diastolic heart failure ([place])   . Diabetes mellitus without complication ([place])    boderline  . GERD (gastroesophageal reflux disease)   . Heart murmur 1995  . History of kidney stones   . Hypercholesteremia   . Hypertension   . Kidney stones   . Liver cancer ([place]) 2019  . Myocardial infarction ([place])   . OSA (obstructive sleep apnea)  no cpap, can't tolerate  . Schizoaffective disorder, bipolar type ([place]) 04/25/2016    Past Surgical History:  Procedure Laterality Date  . APPENDECTOMY    . BIOPSY  03/09/2017   Procedure: BIOPSY;  Surgeon: Danie Binder, MD;  Location[facility];  Service: Endoscopy;;  gastric esophageal  . BIOPSY  11/23/2017   Procedure: BIOPSY;  Surgeon: Danie Binder, MD;  Location: [facility];  Service: Endoscopy;;  esophagus  . CHOLECYSTECTOMY N/A  10/20/2013   Procedure: LAPAROSCOPIC CHOLECYSTECTOMY;  Surgeon: [name], MD;  Location: [facility] ORS;  Service: General;  Laterality: N/A;  . COLONOSCOPY WITH PROPOFOL N/A 03/09/2017   Procedure: COLONOSCOPY WITH PROPOFOL;  Surgeon: Danie Binder, MD;  Location[facility];  Service: Endoscopy;  Laterality: N/A;  12:15pm  . ESOPHAGOGASTRODUODENOSCOPY (EGD) WITH PROPOFOL N/A 03/09/2017   Procedure: ESOPHAGOGASTRODUODENOSCOPY (EGD) WITH PROPOFOL;  Surgeon: Danie Binder, MD;  [facility];  Service: Endoscopy;  Laterality: N/A;  . ESOPHAGOGASTRODUODENOSCOPY (EGD) WITH PROPOFOL N/A 11/23/2017   Procedure: ESOPHAGOGASTRODUODENOSCOPY (EGD) WITH PROPOFOL;  Surgeon: Danie Binder, MD;  Location[facility];  Service: Endoscopy;  Laterality: N/A;  10:00am  . EUS N/A 04/08/2017   Procedure: UPPER ENDOSCOPIC ULTRASOUND (EUS) RADIAL;  Surgeon: Milus Banister, MD;  Location: [facility];  Service: Endoscopy;  Laterality: N/A;  . HIP SURGERY Left   . KNEE SURGERY Left   . POLYPECTOMY  03/09/2017   Procedure: POLYPECTOMY;  Surgeon: Danie Binder, MD;  [facility];  Service: Endoscopy;;  colon   . PORTACATH PLACEMENT Left 04/07/2017   Procedure: INSERTION PORT-A-CATH LEFT SUBCLAVIAN;  Surgeon: Virl Cagey, MD;  Location: [facility] ORS;  Service: General;  Laterality: Left;  . SAVORY DILATION N/A 03/09/2017   Procedure: SAVORY DILATION;  Surgeon: Danie Binder, MD;  [facility];  Service: Endoscopy;  Laterality: N/A;    Family History  Problem Relation Age of Onset  . Hypertension Mother   . Dementia Mother   . Cancer Mother        breast  . Colon cancer Neg Hx   . Colon polyps Neg Hx     Social History   Socioeconomic History  . Marital status: Divorced    Spouse name: Not on file  . Number of children: Not on file  . Years of education: Not on file  . Highest education level: Not on file  Occupational History  . Not on file  Social Needs  .  Financial resource strain: Not on file  . Food insecurity:    Worry: Not on file    Inability: Not on file  . Transportation needs:    Medical: No    Non-medical: No  Tobacco Use  . Smoking status: Never Smoker  . Smokeless tobacco: Never Used  Substance and Sexual Activity  . Alcohol use: No  . Drug use: No  . Sexual activity: Not Currently    Birth control/protection: Implant  Lifestyle  . Physical activity:    Days per week: Not on file    Minutes per session: Not on file  . Stress: Not on file  Relationships  . Social connections:    Talks on phone: Not on file    Gets together: Not on file    Attends religious service: Not on file    Active member of club or organization: Not on file    Attends meetings of clubs or organizations: Not on file  Relationship status: Not on file  . Intimate partner violence:    Fear of current or ex partner: Not on file    Emotionally abused: Not on file    Physically abused: Not on file    Forced sexual activity: Not on file  Other Topics Concern  . Not on file  Social History Narrative  . Not on file    Past Medical History, Surgical history, Social history, and Family history were reviewed and updated as appropriate.   Please see review of systems for further details on the patient's review from today.   Review of Systems:  Review of Systems  Constitutional: Positive for fatigue. Negative for chills, diaphoresis and fever.  HENT: Negative for trouble swallowing and voice change.   Respiratory: Negative for cough, chest tightness, shortness of breath and wheezing.   Cardiovascular: Positive for chest pain. Negative for palpitations.  Gastrointestinal: Negative for abdominal pain, constipation, diarrhea, nausea and vomiting.  Musculoskeletal: Negative for back pain and myalgias.  Neurological: Negative for dizziness, light-headedness and headaches.    Objective:   Physical Exam:  There were no vitals taken for this  visit. ECOG: 1  Physical Exam  Constitutional: No distress.  The patient is an adult chronically ill-appearing male who appears to be in no acute distress.  HENT:  Head: Normocephalic and atraumatic.  Eyes: Right eye exhibits no discharge. Left eye exhibits no discharge. No scleral icterus.  Cardiovascular: S1 normal, S2 normal and normal heart sounds. Tachycardia present.  Pulmonary/Chest: Effort normal and breath sounds normal. No stridor. No respiratory distress. He has no wheezes. He has no rales.  Skin: Skin is warm and dry. He is not diaphoretic.    Lab Review:     Component Value Date/Time   NA 137 12/28/2017 0352   K 3.9 12/28/2017 0352   CL 103 12/28/2017 0352   CO2 26 12/28/2017 0352   GLUCOSE 261 (H) 12/28/2017 0352   BUN 21 12/28/2017 0352   CREATININE 0.81 12/28/2017 0352   CREATININE 1.00 10/08/2017 1101   CALCIUM 7.2 (L) 12/28/2017 0352   PROT 4.6 (L) 12/28/2017 0352   ALBUMIN 2.5 (L) 12/28/2017 0352   AST 29 12/28/2017 0352   ALT 31 12/28/2017 0352   ALKPHOS 89 12/28/2017 0352   BILITOT 0.8 12/28/2017 0352   GFRNONAA >60 12/28/2017 0352   GFRNONAA 78 10/08/2017 1101   GFRAA >60 12/28/2017 0352   GFRAA 90 10/08/2017 1101       Component Value Date/Time   WBC 6.8 12/27/2017 1141   RBC 5.04 12/27/2017 1141   HGB 15.1 12/27/2017 1141   HCT 44.3 12/27/2017 1141   PLT 74 (L) 12/27/2017 1141   MCV 87.9 12/27/2017 1141   MCH 30.0 12/27/2017 1141   MCHC 34.1 12/27/2017 1141   RDW 14.7 12/27/2017 1141   LYMPHSABS 0.4 (L) 12/27/2017 0948   MONOABS 0.7 12/27/2017 0948   EOSABS 0.0 12/27/2017 0948   BASOSABS 0.0 12/27/2017 0948   -------------------------------  Imaging from last 24 hours (if applicable):  Radiology interpretation: Dg Chest 2 View  Result Date: 12/18/2017 CLINICAL DATA:  Dehydration and weakness.  Stage IV liver cancer. EXAM: CHEST - 2 VIEW COMPARISON:  10/25/2017. FINDINGS: Cardiac enlargement. Left chest wall port a catheter noted  with tip at the cavoatrial junction. No pleural effusion. The lung volumes are low. No airspace opacities identified. IMPRESSION: 1. Low lung volumes. 2. Cardiac enlargement. Electronically Signed   By: Kerby Moors M.D.   On: 12/18/2017 22:00  Ct Head W & Wo Contrast  Result Date: 12/03/2017 CLINICAL DATA:  67 year old male with esophageal cancer. EXAM: CT HEAD WITHOUT AND WITH CONTRAST TECHNIQUE: Contiguous axial images were obtained from the base of the skull through the vertex without and with intravenous contrast CONTRAST:  43mL OMNIPAQUE IOHEXOL 300 MG/ML  SOLN COMPARISON:  PET-CT 03/24/2017.  Noncontrast head CT 03/05/2016. FINDINGS: Brain: Heterogeneously enhancing mass in the right lower cerebellum estimated at 24-26 millimeters diameter (series 3, image 6) is associated with mild regional cerebellar edema (series 2, image 6). There is no significant mass effect. No other intracranial mass or abnormal enhancement. Patchy bilateral cerebral white matter hypodensity appears progressed since 2018. A possible small area of left inferior frontal gyrus encephalomalacia on series 2, image 13 is stable. No midline shift or ventriculomegaly. No intracranial hemorrhage or evidence of cortically based acute infarction. Vascular: Calcified atherosclerosis at the skull base. The major intracranial vascular structures are enhancing and appear patent. Skull: Stable and negative. Sinuses/Orbits: Bubbly opacity in the right sphenoid sinus but otherwise stable and well pneumatized. Tympanic cavities and mastoids remain clear. Other: Visualized orbit soft tissues are within normal limits. Visualized scalp soft tissues are within normal limits. IMPRESSION: 1. Positive for a solitary enhancing mass in the right cerebellum compatible with Metastasis. Size estimated at 24-26 mm. Surrounding edema but no significant mass effect. 2. No other metastatic disease identified, but head MRI (without and with contrast) would be  most sensitive and is recommended. These results will be called to the ordering clinician or representative by the Radiologist Assistant, and communication documented in the PACS or zVision Dashboard. Electronically Signed   By: Genevie Ann M.D.   On: 12/03/2017 09:16   Mr Jeri Cos ZO Contrast  Result Date: 12/19/2017 CLINICAL DATA:  Esophageal adenocarcinoma. Right cerebellar mass suspected. Planning for [facility] treatment. EXAM: MRI HEAD WITHOUT AND WITH CONTRAST TECHNIQUE: Multiplanar, multiecho pulse sequences of the brain and surrounding structures were obtained without and with intravenous contrast. CONTRAST:  13mL MULTIHANCE GADOBENATE DIMEGLUMINE 529 MG/ML IV SOLN COMPARISON:  CT head without and with contrast 12/03/2017 FINDINGS: Brain: Enhancing mass lesion in the inferolateral right cerebellum measures 1.6 x 1.3 x 1.9 cm. This corresponds to the lesion identified on CT. No additional enhancing lesions are present. Moderate generalized atrophy and diffuse white matter disease is present. Ventricles are of normal size. No significant extra-axial fluid collection is present. Vasogenic edema surrounds the right cerebellar lesion. Vascular: Flow is present in the major intracranial arteries. Skull and upper cervical spine: Exaggerated cervical lordosis is noted. Craniocervical junction is normal. Marrow signal is normal. Sinuses/Orbits: The paranasal sinuses and mastoid air cells are clear. Globes and orbits are within normal limits. IMPRESSION: 1. Solitary enhancing mass lesion in the right cerebellum measures 1.6 x 1.3 x 1.9 cm. This is highly concerning for a focal metastasis. 2. Atrophy and diffuse white matter disease is markedly advanced for age. This likely reflects the sequela of chronic microvascular ischemia Electronically Signed   By: San Morelle M.D.   On: 12/19/2017 15:24

## 2017-12-28 NOTE — Progress Notes (Signed)
Subjective: He says he feels better.  He was admitted yesterday with elevated blood sugar.  His glucose was greater than 700.  He is doing better now and his blood sugars down around 200.  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  He complains of some pain when he swallows.  Discussed his CODE STATUS and he says he wants to stay alive but I am not sure he really understands what may be in store for him if he goes on life support.  I am going to get palliative care involved.  Objective: Vital signs in last 24 hours: Temp:  [97.4 F (36.3 C)-97.9 F (36.6 C)] 97.7 F (36.5 C) (11/12 0811) Pulse Rate:  [71-95] 73 (11/12 0500) Resp:  [11-19] 11 (11/12 0100) BP: (72-124)/(42-97) 124/72 (11/12 0500) SpO2:  [92 %-100 %] 99 % (11/12 0500) Weight:  [81.6 kg-85.7 kg] 81.6 kg (11/12 0500) Weight change:     Intake/Output from previous day: 11/11 0701 - 11/12 0700 In: 2547.6 [P.O.:480; I.V.:2067.6] Out: 1900 [Urine:1900]  PHYSICAL EXAM General appearance: alert, cooperative and no distress Resp: clear to auscultation bilaterally Cardio: regular rate and rhythm, S1, S2 normal, no murmur, click, rub or gallop GI: soft, non-tender; bowel sounds normal; no masses,  no organomegaly Extremities: extremities normal, atraumatic, no cyanosis or edema  Lab Results:  Results for orders placed or performed during the hospital encounter of 12/27/17 (from the past 48 hour(s))  CBG monitoring, ED     Status: Abnormal   Collection Time: 12/27/17 11:24 AM  Result Value Ref Range   Glucose-Capillary >600 (HH) 70 - 99 mg/dL  Basic metabolic panel     Status: Abnormal   Collection Time: 12/27/17 11:41 AM  Result Value Ref Range   Sodium 129 (L) 135 - 145 mmol/L   Potassium 5.6 (H) 3.5 - 5.1 mmol/L   Chloride 95 (L) 98 - 111 mmol/L   CO2 23 22 - 32 mmol/L   Glucose, Bld 717 (HH)  70 - 99 mg/dL    Comment: CRITICAL RESULT CALLED TO, READ BACK BY AND VERIFIED WITH: PATRAW,B AT 12:15PM ON 12/27/17 BY FESTERMAN,C    BUN 32 (H) 8 - 23 mg/dL   Creatinine, Ser 1.08 0.61 - 1.24 mg/dL   Calcium 8.6 (L) 8.9 - 10.3 mg/dL   GFR calc non Af Amer >60 >60 mL/min   GFR calc Af Amer >60 >60 mL/min    Comment: (NOTE) The eGFR has been calculated using the CKD EPI equation. This calculation has not been validated in all clinical situations. eGFR's persistently <60 mL/min signify possible Chronic Kidney Disease.    Anion gap 11 5 - 15    Comment: Performed at [facility], [address] [place], [place] [address]  CBC     Status: Abnormal   Collection Time: 12/27/17 11:41 AM  Result Value Ref Range   WBC 6.8 4.0 - 10.5 K/uL   RBC 5.04 4.22 - 5.81 MIL/uL   Hemoglobin 15.1 13.0 - 17.0 g/dL   HCT 44.3 39.0 - 52.0 %   MCV 87.9 80.0 - 100.0 fL   MCH 30.0 26.0 - 34.0 pg   MCHC 34.1 30.0 - 36.0 g/dL   RDW 14.7 11.5 - 15.5 %   Platelets 74 (L) 150 - 400 K/uL    Comment: Immature Platelet Fraction may be clinically indicated, consider ordering  this additional test [id number] CONSISTENT WITH PREVIOUS RESULT    nRBC 0.0 0.0 - 0.2 %    Comment: Performed at [facility], [address] [place], [place] [address]  Urinalysis, Routine w reflex microscopic     Status: Abnormal   Collection Time: 12/27/17 12:00 PM  Result Value Ref Range   Color, Urine STRAW (A) YELLOW   APPearance CLEAR CLEAR   Specific Gravity, Urine 1.023 1.005 - 1.030   pH 6.0 5.0 - 8.0   Glucose, UA >=500 (A) NEGATIVE mg/dL   Hgb urine dipstick NEGATIVE NEGATIVE   Bilirubin Urine NEGATIVE NEGATIVE   Ketones, ur 5 (A) NEGATIVE mg/dL   Protein, ur NEGATIVE NEGATIVE mg/dL   Nitrite NEGATIVE NEGATIVE   Leukocytes, UA NEGATIVE NEGATIVE   RBC / HPF 0-5 0 - 5 RBC/hpf   WBC, UA 0-5 0 - 5 WBC/hpf   Bacteria, UA NONE SEEN NONE SEEN    Comment: Performed at [facility], [address] [place], [place] [address]   Protime-INR     Status: None   Collection Time: 12/27/17 12:01 PM  Result Value Ref Range   Prothrombin Time 12.3 11.4 - 15.2 seconds   INR 0.93     Comment: Performed at [facility], [address] [place], [place] [address]  CBG monitoring, ED     Status: Abnormal   Collection Time: 12/27/17 12:23 PM  Result Value Ref Range   Glucose-Capillary >600 (HH) 70 - 99 mg/dL  CBG monitoring, ED     Status: Abnormal   Collection Time: 12/27/17  2:01 PM  Result Value Ref Range   Glucose-Capillary 384 (H) 70 - 99 mg/dL  CBG monitoring, ED     Status: Abnormal   Collection Time: 12/27/17  3:09 PM  Result Value Ref Range   Glucose-Capillary 441 (H) 70 - 99 mg/dL  Blood gas, venous     Status: Abnormal   Collection Time: 12/27/17  3:37 PM  Result Value Ref Range   FIO2 0.21    pH, Ven 7.429 7.250 - 7.430   pCO2, Ven 39.5 (L) 44.0 - 60.0 mmHg   pO2, Ven 62.8 (H) 32.0 - 45.0 mmHg   Bicarbonate 25.9 20.0 - 28.0 mmol/L   Acid-Base Excess 1.7 0.0 - 2.0 mmol/L   O2 Saturation 91.7 %   Patient temperature 37.0    Collection site VENOUS    Drawn by DRAWN BY RN    Sample type VENOUS     Comment: Performed at [facility], [address] [place], [place] [address]  HIV antibody (Routine Testing)     Status: None   Collection Time: 12/27/17  3:37 PM  Result Value Ref Range   HIV Screen 4th Generation wRfx Non Reactive Non Reactive    Comment: (NOTE) Performed At: [facility] [address] [place], [place] [id number] Rush Farmer MD EX:[id number]   CBG monitoring, ED     Status: Abnormal   Collection Time: 12/27/17  4:23 PM  Result Value Ref Range   Glucose-Capillary 224 (H) 70 - 99 mg/dL  MRSA PCR Screening     Status: None   Collection Time: 12/27/17  4:45 PM  Result Value Ref Range   MRSA by PCR NEGATIVE NEGATIVE    Comment:        The GeneXpert MRSA Assay (FDA approved for NASAL specimens only), is one component of a comprehensive MRSA colonization surveillance  program. It is not intended to diagnose MRSA infection nor to guide or  monitor treatment for MRSA infections. Performed at [facility], [address], [place], [place] [address]   Glucose, capillary     Status: Abnormal   Collection Time: 12/27/17  5:21 PM  Result Value Ref Range   Glucose-Capillary 230 (H) 70 - 99 mg/dL  Urinalysis, Routine w reflex microscopic     Status: Abnormal   Collection Time: 12/27/17  5:24 PM  Result Value Ref Range   Color, Urine STRAW (A) YELLOW   APPearance CLEAR CLEAR   Specific Gravity, Urine 1.025 1.005 - 1.030   pH 5.0 5.0 - 8.0   Glucose, UA >=500 (A) NEGATIVE mg/dL   Hgb urine dipstick NEGATIVE NEGATIVE   Bilirubin Urine NEGATIVE NEGATIVE   Ketones, ur 5 (A) NEGATIVE mg/dL   Protein, ur 30 (A) NEGATIVE mg/dL   Nitrite NEGATIVE NEGATIVE   Leukocytes, UA NEGATIVE NEGATIVE   RBC / HPF 0-5 0 - 5 RBC/hpf   WBC, UA 0-5 0 - 5 WBC/hpf   Bacteria, UA RARE (A) NONE SEEN   Hyaline Casts, UA PRESENT     Comment: Performed at [facility], [address], [place], [place] [address]  Lactic acid, plasma     Status: Abnormal   Collection Time: 12/27/17  6:25 PM  Result Value Ref Range   Lactic Acid, Venous 2.4 (HH) 0.5 - 1.9 mmol/L    Comment: CRITICAL RESULT CALLED TO, READ BACK BY AND VERIFIED WITH: HARRIS,B ON 12/27/17 AT 1945 BY LOY,C Performed at [facility], [address], [place], [place] [address]   Glucose, capillary     Status: None   Collection Time: 12/27/17  6:27 PM  Result Value Ref Range   Glucose-Capillary 89 70 - 99 mg/dL  Lactic acid, plasma     Status: None   Collection Time: 12/27/17  9:33 PM  Result Value Ref Range   Lactic Acid, Venous 1.8 0.5 - 1.9 mmol/L    Comment: Performed at [facility], [address], [place], [place] [address]  Glucose, capillary     Status: Abnormal   Collection Time: 12/27/17  9:36 PM  Result Value Ref Range   Glucose-Capillary 299 (H) 70 - 99 mg/dL  Comprehensive metabolic panel     Status:  Abnormal   Collection Time: 12/28/17  3:52 AM  Result Value Ref Range   Sodium 137 135 - 145 mmol/L    Comment: DELTA CHECK NOTED   Potassium 3.9 3.5 - 5.1 mmol/L    Comment: DELTA CHECK NOTED   Chloride 103 98 - 111 mmol/L   CO2 26 22 - 32 mmol/L   Glucose, Bld 261 (H) 70 - 99 mg/dL   BUN 21 8 - 23 mg/dL   Creatinine, Ser 0.81 0.61 - 1.24 mg/dL   Calcium 7.2 (L) 8.9 - 10.3 mg/dL   Total Protein 4.6 (L) 6.5 - 8.1 g/dL   Albumin 2.5 (L) 3.5 - 5.0 g/dL   AST 29 15 - 41 U/L   ALT 31 0 - 44 U/L   Alkaline Phosphatase 89 38 - 126 U/L   Total Bilirubin 0.8 0.3 - 1.2 mg/dL   GFR calc non Af Amer >60 >60 mL/min   GFR calc Af Amer >60 >60 mL/min    Comment: (NOTE) The eGFR has been calculated using the CKD EPI equation. This calculation has not been validated in all clinical situations. eGFR's persistently <60 mL/min signify possible Chronic Kidney Disease.    Anion gap 8 5 - 15    Comment: Performed  at [facility], [address], [place], [place] [address]  Glucose, capillary     Status: Abnormal   Collection Time: 12/28/17  8:13 AM  Result Value Ref Range   Glucose-Capillary 256 (H) 70 - 99 mg/dL    ABGS Recent Labs    12/27/17 1537  HCO3 25.9   CULTURES Recent Results (from the past 240 hour(s))  MRSA PCR Screening     Status: None   Collection Time: 12/27/17  4:45 PM  Result Value Ref Range Status   MRSA by PCR NEGATIVE NEGATIVE Final    Comment:        The GeneXpert MRSA Assay (FDA approved for NASAL specimens only), is one component of a comprehensive MRSA colonization surveillance program. It is not intended to diagnose MRSA infection nor to guide or monitor treatment for MRSA infections. Performed at [facility], [address] [place], [place] [address]    Studies/Results: No results found.  Medications:  Prior to Admission:  Medications Prior to Admission  Medication Sig Dispense Refill Last Dose  . AMITIZA 24 MCG capsule Take 24 mcg by mouth 2  (two) times a week.    Past Week at Unknown time  . carvedilol (COREG) 12.5 MG tablet TAKE 1 TABLET(12.5 MG) BY MOUTH TWICE DAILY (Patient taking differently: Take 12.5 mg by mouth 2 (two) times daily with a meal. ) 180 tablet 3 12/27/2017 at 830  . dexamethasone (DECADRON) 4 MG tablet Take 1 tablet (4 mg total) by mouth 2 (two) times daily. 60 tablet 0 12/27/2017 at Unknown time  . ENTRESTO 49-51 MG TAKE 1 TABLET BY MOUTH TWICE DAILY (Patient taking differently: Take 1 tablet by mouth 2 (two) times daily. ) 180 tablet 3 12/27/2017 at 830  . furosemide (LASIX) 40 MG tablet Alternate 40 mg one day and 20 mg the next (Patient taking differently: Take 40 mg by mouth daily. ) 90 tablet 3 12/27/2017 at Unknown time  . glipiZIDE (GLUCOTROL XL) 10 MG 24 hr tablet Take 1 tablet (10 mg total) by mouth daily with breakfast. 30 tablet 2 12/27/2017 at Unknown time  . Insulin Glargine (BASAGLAR KWIKPEN) 100 UNIT/ML SOPN 12 Units daily.   12/27/2017 at Unknown time  . lidocaine-prilocaine (EMLA) cream Apply small amount over port site and cover with plastic wrap one hour prior to appointment. 30 g 0 Taking  . linagliptin (TRADJENTA) 5 MG TABS tablet Take 5 mg by mouth daily.   12/27/2017 at Unknown time  . metFORMIN (GLUCOPHAGE) 500 MG tablet TAKE 1 TABLET(500 MG) BY MOUTH TWICE DAILY WITH A MEAL 60 tablet 2 Past Week at Unknown time  . milk thistle 175 MG tablet Take 175 mg by mouth daily.   Past Week at Unknown time  . OLANZapine (ZYPREXA) 5 MG tablet Take 5 mg by mouth 2 (two) times daily.   12/27/2017 at Unknown time  . ondansetron (ZOFRAN ODT) 8 MG disintegrating tablet Take 1 tablet (8 mg total) by mouth every 8 (eight) hours as needed for nausea or vomiting. 40 tablet 2 Past Week at Unknown time  . OXALIPLATIN IV Inject into the vein.   Taking  . pantoprazole (PROTONIX) 40 MG tablet Take 1 tablet (40 mg total) by mouth 2 (two) times daily before a meal. 60 tablet 3 12/27/2017 at Unknown time  . potassium  chloride SA (K-DUR,KLOR-CON) 20 MEQ tablet Take 20 mEq by mouth daily.   12/27/2017 at Unknown time  . prochlorperazine (COMPAZINE) 10 MG tablet Take 1 tablet (  10 mg total) by mouth every 6 (six) hours as needed for nausea or vomiting. 30 tablet 3 Taking  . rosuvastatin (CRESTOR) 10 MG tablet Take 1 tablet (10 mg total) by mouth daily. 90 tablet 3 12/27/2017 at Unknown time  . vitamin C (ASCORBIC ACID) 250 MG tablet Take by mouth.   12/27/2017 at Unknown time  . acetaminophen (TYLENOL) 325 MG tablet Take 650 mg by mouth every 6 (six) hours as needed for moderate pain.    Taking  . B-D UF III MINI PEN NEEDLES 31G X 5 MM MISC    Taking  . Blood Glucose Monitoring Suppl (ONETOUCH VERIO) w/Device KIT 1 each by Does not apply route as needed. 1 kit 0 Taking  . fluorouracil CALGB 91791 in sodium chloride 0.9 % 150 mL Inject into the vein continuous. Over 46 hours   Taking  . glucose blood test strip 1 each by Other route 4 (four) times daily. Use as instructed qid One Touch ultra 150 each 5 Taking   Scheduled: . carvedilol  12.5 mg Oral BID WC  . dexamethasone  4 mg Oral BID  . furosemide  40 mg Oral Daily  . insulin aspart  0-20 Units Subcutaneous TID WC  . insulin aspart  0-5 Units Subcutaneous QHS  . insulin glargine  20 Units Subcutaneous QHS  . lubiprostone  24 mcg Oral Once per day on Mon Thu  . OLANZapine  5 mg Oral BID  . pantoprazole  40 mg Oral BID AC  . rosuvastatin  10 mg Oral Daily  . sucralfate  1 g Oral TID WC & HS   Continuous: . sodium chloride 125 mL/hr at 12/28/17 0441   TAV:[id number], fentaNYL (SUBLIMAZE) injection, ondansetron, prochlorperazine  Assesment: He has hyperglycemia but does not have DKA.  He is better.  This is at least partially from being on Decadron.  He is on oral medication for his diabetes which I will discontinue and I will increase his Lantus  He has acute on chronic renal failure probably from being dehydrated from his blood sugar and that is  better.  He has coronary disease but no chest pain.  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  He was to start radiation treatments but his blood sugar has been too high Active Problems:   DM type 2 causing vascular disease ([place])   Essential hypertension, benign   Acute on chronic diastolic heart failure (HCC)   Schizoaffective disorder, bipolar type (HCC)   CAD (coronary artery disease)   Acute renal failure superimposed on stage 3 chronic kidney disease (HCC)   Dysphagia   Malignant neoplasm of cardio-esophageal junction (HCC)   Malignant neoplasm of esophagus (HCC)   Brain metastasis (HCC)   Hyperglycemia without ketosis    Plan: Increase Lantus.  Continue sliding scale.  Discontinue oral meds for blood sugar.  Add Carafate for dysphagia.  Reduce his IV fluids.  Request palliative care consultation for goal setting.  Discussed with the patient and his power of attorney at bedside and she also feels like he may not fully understand what may be in store if he goes on life support    LOS: 1 day   Stacee Earp L 12/28/2017, 8:18 AM

## 2017-12-28 NOTE — Progress Notes (Addendum)
Blood glucose checked at 1149 reading was 412. Did not call Dr. Luan Pulling yet. Gave 20 units of novolog insulin per sliding scale for blood sugar up to 400 at 1237. At 1307 blood glucose reading was 501. Dr. Luan Pulling was called and verbal orders were given: give 25 units now and change night coverage from 20 units to 25 units of Lantus.  Gave the 25 units of novolog insulin at 1327. Blood sugar checked at 1501, received a reading of 397. Patient asking for snacks. Going to make him wait until dinner. And will give insulin as scheduled.  Blood sugar at 1655 was 199.

## 2017-12-29 ENCOUNTER — Telehealth: Payer: Self-pay | Admitting: Radiation Therapy

## 2017-12-29 ENCOUNTER — Ambulatory Visit (HOSPITAL_COMMUNITY): Payer: Self-pay

## 2017-12-29 ENCOUNTER — Other Ambulatory Visit (HOSPITAL_COMMUNITY): Payer: Self-pay

## 2017-12-29 DIAGNOSIS — R1310 Dysphagia, unspecified: Secondary | ICD-10-CM

## 2017-12-29 LAB — GLUCOSE, CAPILLARY
GLUCOSE-CAPILLARY: 385 mg/dL — AB (ref 70–99)
GLUCOSE-CAPILLARY: 257 mg/dL — AB (ref 70–99)
Glucose-Capillary: 229 mg/dL — ABNORMAL HIGH (ref 70–99)
Glucose-Capillary: 399 mg/dL — ABNORMAL HIGH (ref 70–99)

## 2017-12-29 MED ORDER — LIDOCAINE VISCOUS HCL 2 % MT SOLN
15.0000 mL | OROMUCOSAL | Status: DC | PRN
  Administered 2017-12-29: 15 mL via OROMUCOSAL
  Filled 2017-12-29: qty 15

## 2017-12-29 MED ORDER — INSULIN GLARGINE 100 UNIT/ML ~~LOC~~ SOLN
30.0000 [IU] | Freq: Every day | SUBCUTANEOUS | Status: DC
  Administered 2017-12-29: 30 [IU] via SUBCUTANEOUS
  Filled 2017-12-29: qty 0.3

## 2017-12-29 MED ORDER — GLUCERNA SHAKE PO LIQD
237.0000 mL | Freq: Two times a day (BID) | ORAL | Status: DC
  Administered 2017-12-29 – 2018-01-01 (×7): 237 mL via ORAL

## 2017-12-29 NOTE — Clinical Social Work Placement (Signed)
   CLINICAL SOCIAL WORK PLACEMENT  NOTE  Date:  12/29/2017  Patient Details  Name: [name] MRN: [id number] Date of Birth: 10/06/1950  Clinical Social Work is seeking post-discharge placement for this patient at the [place] level of care (*CSW will initial, date and re-position this form in  chart as items are completed):  Yes   Patient/family provided with [facility] Work Department's list of facilities offering this level of care within the geographic area requested by the patient (or if unable, by the patient's family).  Yes   Patient/family informed of their freedom to choose among providers that offer the needed level of care, that participate in Medicare, Medicaid or managed care program needed by the patient, have an available bed and are willing to accept the patient.  Yes   Patient/family informed of [facility]'s ownership interest in [facility] and [facility], as well as of the fact that they are under no obligation to receive care at these facilities.  PASRR submitted to EDS on 12/28/17     PASRR number received on 12/29/17     Existing PASRR number confirmed on       FL2 transmitted to all facilities in geographic area requested by pt/family on 12/29/17     FL2 transmitted to all facilities within larger geographic area on       Patient informed that his/her managed care company has contracts with or will negotiate with certain facilities, including the following:            Patient/family informed of bed offers received.  Patient chooses bed at       Physician recommends and patient chooses bed at      Patient to be transferred to   on  .  Patient to be transferred to facility by       Patient family notified on   of transfer.  Name of family member notified:        PHYSICIAN       Additional Comment:  [facility] [place] [phone]   _______________________________________________ Trish Mage, LCSW 12/29/2017, 1:33 PM

## 2017-12-29 NOTE — Progress Notes (Signed)
Inpatient Diabetes Program Recommendations  AACE/ADA: New Consensus Statement on Inpatient Glycemic Control (2015)  Target Ranges:  Prepandial:   less than 140 mg/dL      Peak postprandial:   less than 180 mg/dL (1-2 hours)      Critically ill patients:  140 - 180 mg/dL   Results for ZYRION, COEY (MRN [id number]) as of 12/29/2017 12:31  Ref. Range 12/28/2017 08:13 12/28/2017 11:49 12/28/2017 13:07 12/28/2017 15:01 12/28/2017 16:53 12/28/2017 21:17  Glucose-Capillary Latest Ref Range: 70 - 99 mg/dL 256 (H)  8 units NOVOLOG  412 (H)  20 units NOVOLOG  501 (HH)  25 units NOVOLOG  397 (H) 199 (H)  4 units NOVOLOG  171 (H)    25 units LANTUS   Results for RITCHIE, KLEE (MRN [id number]) as of 12/29/2017 12:31  Ref. Range 12/29/2017 07:50 12/29/2017 11:25  Glucose-Capillary Latest Ref Range: 70 - 99 mg/dL 229 (H)  7 units NOVOLOG  399 (H)  20 units NOVOLOG    Home DM meds: Basaglar 12 units Daily       Glipizide 10 mg Daily       Tradjenta 5 mg daily       Metformin 500 mg BID   Current Orders: Lantus 30 units QHS       Novolog Resistant Correction Scale/ SSI (0-20 units) TID AC + HS       Getting Decadron 4 mg BID.    Note Lantus dose increased for tonight.    MD- Please also consider starting Novolog Meal Coverage while patient remains on Decadron:  Novolog 10 units TID with meals  (Please add the following Hold Parameters: Hold if pt eats <50% of meal, Hold if pt NPO)      --Will follow patient during hospitalization--  Wyn Quaker RN, MSN, CDE Diabetes Coordinator Inpatient Glycemic Control Team Team Pager: [phone] (8a-5p)

## 2017-12-29 NOTE — Evaluation (Signed)
Physical Therapy Evaluation Patient Details Name: EMMETTE KATT MRN: [id number] DOB: 12-Nov-1950 Today's Date: 12/29/2017   History of Present Illness  Mare B. 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  Patient has been getting daily p.o. steroids for his brain metastasis and his blood sugar had been elevated for several months he was put on medication for it.  Today he got a routine blood work at his primary care provider follow-up and was found to have blood sugar in the 600s and told to come to the emergency department for evaluation.  He endorses frequent urination and thirst.  Patient lives at home at alone and he probably not following proper diet for his sugars he has an outpatient social worker who is working on placement    Clinical Impression  Patient has difficulty for sitting up at bedside due to generalized weakness, demonstrates slow unsteady ataxic like gait with tendency to lean on nearby objects for support requiring use of RW for safety.  Patient drifts to right ambulating with RW requiring verbal/tactile cueing to avoid bumping into walls.  Patient tolerated sitting up in chair after therapy.  Patient will benefit from continued physical therapy in hospital and recommended venue below to increase strength, balance, endurance for safe ADLs and gait.    Follow Up Recommendations SNF    Equipment Recommendations  None recommended by PT    Recommendations for Other Services       Precautions / Restrictions Precautions Precautions: Fall Restrictions Weight Bearing Restrictions: No      Mobility  Bed  Mobility Overal bed mobility: Needs Assistance Bed Mobility: Supine to Sit     Supine to sit: Min assist     General bed mobility comments: slow labored movement, unable to prop up on elbows for supine to sitting due to weakness  Transfers Overall transfer level: Needs assistance Equipment used: Rolling walker (2 wheeled);None Transfers: Sit to/from [employer] to Stand: Min assist Stand pivot transfers: Min assist       General transfer comment: very unsteady on feet without AD, requires RW for safety  Ambulation/Gait Ambulation/Gait assistance: Min assist Gait Distance (Feet): 50 Feet Assistive device: Rolling walker (2 wheeled) Gait Pattern/deviations: Decreased step length - right;Decreased step length - left;Decreased stride length Gait velocity: decreased   General Gait Details: unsteady ataxic like gait without using AD, required RW for safety, drifts to the right requiring VC's for safety, limited secondary to fatigue  Stairs            Wheelchair Mobility    Modified Rankin (Stroke Patients Only)       Balance Overall balance assessment: Needs assistance Sitting-balance support: Feet supported;No upper extremity supported Sitting balance-Leahy Scale: Good     Standing balance support: During functional activity;No upper extremity supported Standing balance-Leahy Scale: Poor Standing balance comment: fair using RW                             Pertinent Vitals/Pain Pain Assessment: No/denies pain    Home Living Family/patient expects to be discharged to:: Private residence Living Arrangements: Alone Available Help at Discharge: Neighbor;Friend(s) Type of Home: [place]  Access: Stairs to enter Entrance Stairs-Rails: Right;Left(to wide to use both) Entrance Stairs-Number of Steps: 5 Home Layout: Two level Home Equipment: Cane - single point;Walker - 2 wheels;Bedside commode;Shower seat      Prior Function  Level of Independence: Independent with assistive device(s)         Comments: Hydrographic surveyor with SPC PRN, drives     Hand Dominance        Extremity/Trunk Assessment   Upper Extremity Assessment Upper Extremity Assessment: Generalized weakness    Lower Extremity Assessment Lower Extremity Assessment: Generalized weakness    Cervical / Trunk Assessment Cervical / Trunk Assessment: Normal  Communication   Communication: No difficulties  Cognition Arousal/Alertness: Awake/alert Behavior During Therapy: WFL for tasks assessed/performed Overall Cognitive Status: Within Functional Limits for tasks assessed                                        General Comments      Exercises     Assessment/Plan    PT Assessment Patient needs continued PT services  PT Problem List Decreased strength;Decreased activity tolerance;Decreased balance;Decreased mobility       PT Treatment Interventions Gait training;Stair training;Functional mobility training;Therapeutic activities;Therapeutic exercise;Patient/family education    PT Goals (Current goals can be found in the Care Plan section)  Acute Rehab PT Goals Patient Stated Goal: return home after rehab PT Goal Formulation: With patient Time For Goal Achievement: 01/12/18 Potential to Achieve Goals: Good    Frequency Min 3X/week   Barriers to discharge        Co-evaluation               AM-PAC PT "6 Clicks" Daily Activity  Outcome Measure Difficulty turning over in bed (including adjusting bedclothes, sheets and blankets)?: Unable Difficulty moving from lying on back to sitting on the side of the bed? : Unable Difficulty sitting down on and standing up from a chair with arms (e.g., wheelchair, bedside commode, etc,.)?: Unable Help needed moving to and from a bed to chair (including a wheelchair)?: A Little Help needed walking in hospital room?: A Little Help needed climbing 3-5 steps with a  railing? : A Lot 6 Click Score: 11    End of Session Equipment Utilized During Treatment: Gait belt Activity Tolerance: Patient tolerated treatment well;Patient limited by fatigue Patient left: in chair;with call bell/phone within reach Nurse Communication: Mobility status PT Visit Diagnosis: Unsteadiness on feet (R26.81);Other abnormalities of gait and mobility (R26.89);Muscle weakness (generalized) (M62.81)    Time: 5573-2202 PT Time Calculation (min) (ACUTE ONLY): 26 min   Charges:   PT Evaluation $PT Eval Moderate Complexity: 1 Mod PT Treatments $Therapeutic Activity: 23-37 mins        2:10 PM, 12/29/17 Lonell Grandchild, MPT Physical Therapist with [facility] [phone] office [phone] mobile phone

## 2017-12-29 NOTE — NC FL2 (Addendum)
[place] LEVEL OF CARE SCREENING TOOL     IDENTIFICATION  Patient Name: [name] Birthdate: 1950-10-30 Sex: male Admission Date (Current Location): 12/27/2017  [facility] and [place] Number:  [employer] and Address:  [place] [address] [place]      Provider Number: [id number]  Attending Physician Name and Address:  Sinda Du, MD  Relative Name and Phone Number:       Current Level of Care: Hospital Recommended Level of Care: [place] Prior Approval Number:    Date Approved/Denied:   PASRR Number:   [id number]  Discharge Plan: SNF    Current Diagnoses: Patient Active Problem List   Diagnosis Date Noted  . DNR (do not resuscitate)   . Hyperglycemia without ketosis 12/27/2017  . Brain metastasis ([place]) 12/10/2017  . Uncontrolled type 2 diabetes mellitus with hyperglycemia ([place]) 05/27/2017  . Mixed hyperlipidemia 05/27/2017  . Decreased oral intake 04/22/2017  . Malignant neoplasm of esophagus ([place])   . Malignant neoplasm of cardio-esophageal junction ([place]) 03/30/2017  . Goals of care, counseling/discussion 01/29/2017  . Dysphagia 01/29/2017  . GERD (gastroesophageal reflux disease) 01/29/2017  . Acute renal failure superimposed on stage 3 chronic kidney disease ([place]) 08/18/2016  . CHF (congestive heart failure) ([place]) 08/16/2016  . Acute on chronic combined systolic and diastolic CHF (congestive heart failure) ([place]) 08/16/2016  . OSA (obstructive sleep apnea) 08/16/2016  . Acute renal insufficiency 08/16/2016  . Acute on chronic combined systolic (congestive) and diastolic (congestive) heart failure ([place]) 08/16/2016  . CAD (coronary artery disease) 08/16/2016  . Schizoaffective disorder, bipolar type ([place]) 04/25/2016  . Acute on chronic diastolic heart failure ([place]) 03/07/2016  . Cellulitis and abscess of foot 03/05/2016  . DM type 2 causing vascular disease ([place]) 03/05/2016  .  Essential hypertension, benign 03/05/2016  . Involuntary commitment 03/05/2016  . Cellulitis 03/05/2016    Orientation RESPIRATION BLADDER Height & Weight     Self, Situation, Place  Normal Continent Weight: 81.8 kg Height:  5\' 4"  (162.6 cm)  BEHAVIORAL SYMPTOMS/MOOD NEUROLOGICAL BOWEL NUTRITION STATUS  (none) (none) Continent Diet(carb modified)  AMBULATORY STATUS COMMUNICATION OF NEEDS Skin   Extensive Assist Verbally Normal                       Personal Care Assistance Level of Assistance  Bathing, Feeding, Dressing Bathing Assistance: Limited assistance Feeding assistance: Independent Dressing Assistance: Limited assistance     Functional Limitations Info  Sight, Hearing, Speech Sight Info: Adequate Hearing Info: Adequate Speech Info: Adequate    SPECIAL CARE FACTORS FREQUENCY  PT (By licensed PT)     PT Frequency: 5x/w              Contractures Contractures Info: Not present    Additional Factors Info  Code Status, Allergies Code Status Info: full Allergies Info: codeine, morphine and related           Current Medications (12/29/2017):  This is the current hospital active medication list Current Facility-Administered Medications  Medication Dose Route Frequency Provider Last Rate Last Dose  . 0.9 %  sodium chloride infusion   Intravenous Continuous Sinda Du, MD 50 mL/hr at 12/28/17 1748    . acetaminophen (TYLENOL) tablet 650 mg  650 mg Oral Q6H PRN Cristal Deer, MD      . carvedilol (COREG) tablet 12.5 mg  12.5 mg Oral BID WC Cristal Deer, MD   12.5 mg at 12/28/17  1746  . dexamethasone (DECADRON) tablet 4 mg  4 mg Oral BID Cristal Deer, MD   4 mg at 12/28/17 2114  . fentaNYL (SUBLIMAZE) injection 25 mcg  25 mcg Intravenous Q2H PRN Schorr, Rhetta Mura, NP   25 mcg at 12/27/17 2237  . furosemide (LASIX) tablet 40 mg  40 mg Oral Daily Cristal Deer, MD   40 mg at 12/28/17 0929  . gabapentin (NEURONTIN) capsule 100 mg  100 mg  Oral BID Pershing Proud, NP   382 mg at 12/28/17 2114  . insulin aspart (novoLOG) injection 0-20 Units  0-20 Units Subcutaneous TID WC Sinda Du, MD   7 Units at 12/29/17 [phone]  . insulin aspart (novoLOG) injection 0-5 Units  0-5 Units Subcutaneous QHS Sinda Du, MD      . insulin glargine (LANTUS) injection 30 Units  30 Units Subcutaneous QHS Sinda Du, MD      . lidocaine (XYLOCAINE) 2 % viscous mouth solution 15 mL  15 mL Mouth/Throat Z7Q PRN Pershing Proud, NP      . lubiprostone (AMITIZA) capsule 24 mcg  24 mcg Oral Once per day on Mon Thu [place], Forrest Moron, MD      . OLANZapine [facility]) tablet 5 mg  5 mg Oral BID Cristal Deer, MD   5 mg at 12/28/17 2114  . ondansetron (ZOFRAN-ODT) disintegrating tablet 8 mg  8 mg Oral Q8H PRN Cristal Deer, MD      . pantoprazole (PROTONIX) EC tablet 40 mg  40 mg Oral BID AC Cristal Deer, MD   40 mg at 12/28/17 1746  . prochlorperazine (COMPAZINE) tablet 10 mg  10 mg Oral Q6H PRN Cristal Deer, MD   10 mg at 12/28/17 2114  . rosuvastatin (CRESTOR) tablet 10 mg  10 mg Oral Daily Cristal Deer, MD   10 mg at 12/28/17 0929  . senna (SENOKOT) tablet 8.6 mg  1 tablet Oral QHS PRN Pershing Proud, NP      . sucralfate (CARAFATE) 1 GM/10ML suspension 1 g  1 g Oral TID WC & HS Pershing Proud, NP   1 g at 12/28/17 2114     Discharge Medications: Please see discharge summary for a list of discharge medications.  Relevant Imaging Results:  Relevant Lab Results:   Additional Information [id number], [place]

## 2017-12-29 NOTE — Progress Notes (Addendum)
Daily Progress Note   Patient Name: [name]       Date: 12/29/2017 DOB: [birth year]  Age: 67 y.o. MRN#: [id number] Attending Physician: Sinda Du, MD Primary Care Physician: Sinda Du, MD Admit Date: 12/27/2017  Reason for Consultation/Follow-up: Establishing goals of care  Subjective: [name] is sitting up in chair. Continues to complain of low chest/epigastric pain especially with intake. No other complaints. In good spirits.   Length of Stay: 2  Current Medications: Scheduled Meds:  . carvedilol  12.5 mg Oral BID WC  . dexamethasone  4 mg Oral BID  . furosemide  40 mg Oral Daily  . gabapentin  100 mg Oral BID  . insulin aspart  0-20 Units Subcutaneous TID WC  . insulin aspart  0-5 Units Subcutaneous QHS  . insulin glargine  30 Units Subcutaneous QHS  . lubiprostone  24 mcg Oral Once per day on Mon Thu  . OLANZapine  5 mg Oral BID  . pantoprazole  40 mg Oral BID AC  . rosuvastatin  10 mg Oral Daily  . sucralfate  1 g Oral TID WC & HS    Continuous Infusions: . sodium chloride 50 mL/hr at 12/28/17 1748    PRN Meds: acetaminophen, fentaNYL (SUBLIMAZE) injection, lidocaine, ondansetron, prochlorperazine, senna  Physical Exam  Constitutional: He is oriented to person, place, and time. He appears well-developed.  HENT:  Head: Normocephalic and atraumatic.  Cardiovascular: Normal rate.  Pulmonary/Chest: Effort normal. No accessory muscle usage. No tachypnea. No respiratory distress.  Abdominal: Soft. Normal appearance.  Neurological: He is alert and oriented to person, place, and time.  Although poor insight and understanding  Nursing note and vitals reviewed.           Vital Signs: BP 107/74   Pulse 69   Temp (!) 97.5 F (36.4 C) (Oral)   Resp 16    Ht 5' 4"  (1.626 m)   Wt 81.8 kg   SpO2 96%   BMI 30.95 kg/m  SpO2: SpO2: 96 % O2 Device: O2 Device: Room Air O2 Flow Rate:    Intake/output summary:   Intake/Output Summary (Last 24 hours) at 12/29/2017 1035 Last data filed at 12/29/2017 0600 Gross per 24 hour  Intake 1240 ml  Output 2800 ml  Net -1560 ml   LBM: Last BM Date:  12/22/17 Baseline Weight: Weight: 85.7 kg Most recent weight: Weight: 81.8 kg       Palliative Assessment/Data:    Flowsheet Rows     Most Recent Value  Intake Tab  Referral Department  Hospitalist  Unit at Time of Referral  ICU  Palliative Care Primary Diagnosis  Cancer  Date Notified  12/28/17  Palliative Care Type  New Palliative care  Reason for referral  Clarify Goals of Care  Date of Admission  12/27/17  Date first seen by Palliative Care  12/28/17  # of days Palliative referral response time  0 Day(s)  # of days IP prior to Palliative referral  1  Clinical Assessment  Psychosocial & Spiritual Assessment  Palliative Care Outcomes      Patient Active Problem List   Diagnosis Date Noted  . DNR (do not resuscitate)   . Hyperglycemia without ketosis 12/27/2017  . Brain metastasis ([place]) 12/10/2017  . Uncontrolled type 2 diabetes mellitus with hyperglycemia ([place]) 05/27/2017  . Mixed hyperlipidemia 05/27/2017  . Decreased oral intake 04/22/2017  . Malignant neoplasm of esophagus ([place])   . Malignant neoplasm of cardio-esophageal junction ([place]) 03/30/2017  . Goals of care, counseling/discussion 01/29/2017  . Dysphagia 01/29/2017  . GERD (gastroesophageal reflux disease) 01/29/2017  . Acute renal failure superimposed on stage 3 chronic kidney disease ([place]) 08/18/2016  . CHF (congestive heart failure) ([place]) 08/16/2016  . Acute on chronic combined systolic and diastolic CHF (congestive heart failure) ([place]) 08/16/2016  . OSA (obstructive sleep apnea) 08/16/2016  . Acute renal insufficiency 08/16/2016  . Acute on chronic combined  systolic (congestive) and diastolic (congestive) heart failure ([place]) 08/16/2016  . CAD (coronary artery disease) 08/16/2016  . Schizoaffective disorder, bipolar type ([place]) 04/25/2016  . Acute on chronic diastolic heart failure ([place]) 03/07/2016  . Cellulitis and abscess of foot 03/05/2016  . DM type 2 causing vascular disease ([place]) 03/05/2016  . Essential hypertension, benign 03/05/2016  . Involuntary commitment 03/05/2016  . Cellulitis 03/05/2016    Palliative Care Assessment & Plan   HPI: 67 y.o. 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 Blood sugar is decreasing but continues to have odynophagia 12/29/17. Plans attempting to be arranged for radiation therapy 12/30/17 per notes.   Assessment: I met today at [name]'s bedside. No family/visitors at bedside. [name] continues with pain with eating/drinking but is eating 100% of meals and asking for frequent snacks as well. He slept well. LBM was 12/28/17 and is thin but not frequent - he reports that he has had some issues with diarrhea at home. No constipation. He continues to be hopeful for further radiation and chemotherapy although he has very poor understanding of what this means and entails. Discussed the possibility of radiation tomorrow and reinforced importance of placement for care needs at discharge. Emotional support provided.   Recommendations/Plan:  Chest and stomach pain likely a result from distal esophageal wall thickening noted on CT chest.   Recommend continue decadron.   Carafate added to assist with comfort at meals.   No evidence of thrush.   Neuropathic component?? Adding gabapentin 100  mg po BID.   Lidocaine 2% solution every 4 hours prn added 12/29/17.  LBM 12/28/17: Senokot daily prn.   Malnutrition: Albumin 2.5. Continues to have good appetite and be hungry requesting snacks that are driving up blood sugars. Glucerna BID between meals.   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 Treatment has been delayed d/t complications from poorly controlled diabetes. Not eligible for hospice at this time given desire to pursue treatment. If treatment is stopped for poor tolerance or GOC in the future then he would be eligible for hospice services.    Discharge Planning: CSW to assist with placement. Likely needs SNF with palliative.   Thank you for allowing the Palliative Medicine Team to assist in the care of this patient.   Total Time 25 min Prolonged Time Billed  no       Greater than 50%  of this time was spent counseling and coordinating care related to the above assessment and plan.  Vinie Sill, NP Palliative Medicine Team Pager # [phone] (M-F 8a-5p) Team Phone # [phone] (Nights/Weekends)

## 2017-12-29 NOTE — Plan of Care (Signed)
  Problem: Acute Rehab PT Goals(only PT should resolve) Goal: Pt Will Go Supine/Side To Sit Outcome: Progressing Flowsheets (Taken 12/29/2017 1412) Pt will go Supine/Side to Sit: with min guard assist Goal: Patient Will Transfer Sit To/From Stand Outcome: Progressing Flowsheets (Taken 12/29/2017 1412) Patient will transfer sit to/from stand: with min guard assist Goal: Pt Will Transfer Bed To Chair/Chair To Bed Outcome: Progressing Flowsheets (Taken 12/29/2017 1412) Pt will Transfer Bed to Chair/Chair to Bed: min guard assist Goal: Pt Will Ambulate Outcome: Progressing Flowsheets (Taken 12/29/2017 1412) Pt will Ambulate: with min guard assist; 75 feet; with rolling walker   2:13 PM, 12/29/17 Lonell Grandchild, MPT Physical Therapist with [facility] [phone] office [phone] mobile phone

## 2017-12-29 NOTE — Progress Notes (Signed)
Patient has appointment for radiation scheduled tomorrow for 1030 at the [facility].  They requested he be there at 1015. Spoke with [facility] to transport patient. They will pick him up at 0915 and will bring him back after the appointment.

## 2017-12-29 NOTE — Progress Notes (Signed)
Subjective: He says he feels okay.  Palliative care consultation was done yesterday and was very helpful.  He agrees now to DNR status which I think is appropriate.  He still complains of odyntophagia  Objective: Vital signs in last 24 hours: Temp:  [97.5 F (36.4 C)-98.1 F (36.7 C)] 97.5 F (36.4 C) (11/13 0748) Pulse Rate:  [68-92] 69 (11/13 0748) Resp:  [10-21] 16 (11/13 0400) BP: (97-140)/(58-95) 107/74 (11/13 0600) SpO2:  [86 %-99 %] 96 % (11/13 0748) Weight:  [81.8 kg] 81.8 kg (11/13 0500) Weight change: -3.9 kg Last BM Date: 12/22/17  Intake/Output from previous day: 11/12 0701 - 11/13 0700 In: 1910 [P.O.:360; I.V.:1550] Out: 2800 [Urine:2800]  PHYSICAL EXAM General appearance: alert, cooperative and no distress Resp: clear to auscultation bilaterally Cardio: regular rate and rhythm, S1, S2 normal, no murmur, click, rub or gallop GI: soft, non-tender; bowel sounds normal; no masses,  no organomegaly Extremities: extremities normal, atraumatic, no cyanosis or edema  Lab Results:  Results for orders placed or performed during the hospital encounter of 12/27/17 (from the past 48 hour(s))  CBG monitoring, ED     Status: Abnormal   Collection Time: 12/27/17 11:24 AM  Result Value Ref Range   Glucose-Capillary >600 (HH) 70 - 99 mg/dL  Basic metabolic panel     Status: Abnormal   Collection Time: 12/27/17 11:41 AM  Result Value Ref Range   Sodium 129 (L) 135 - 145 mmol/L   Potassium 5.6 (H) 3.5 - 5.1 mmol/L   Chloride 95 (L) 98 - 111 mmol/L   CO2 23 22 - 32 mmol/L   Glucose, Bld 717 (HH) 70 - 99 mg/dL    Comment: CRITICAL RESULT CALLED TO, READ BACK BY AND VERIFIED WITH: PATRAW,B AT 12:15PM ON 12/27/17 BY FESTERMAN,C    BUN 32 (H) 8 - 23 mg/dL   Creatinine, Ser 1.08 0.61 - 1.24 mg/dL   Calcium 8.6 (L) 8.9 - 10.3 mg/dL   GFR calc non Af Amer >60 >60 mL/min   GFR calc Af Amer >60 >60 mL/min    Comment: (NOTE) The eGFR has been calculated using the CKD EPI  equation. This calculation has not been validated in all clinical situations. eGFR's persistently <60 mL/min signify possible Chronic Kidney Disease.    Anion gap 11 5 - 15    Comment: Performed at [facility], [address] [place], [place] [address]  CBC     Status: Abnormal   Collection Time: 12/27/17 11:41 AM  Result Value Ref Range   WBC 6.8 4.0 - 10.5 K/uL   RBC 5.04 4.22 - 5.81 MIL/uL   Hemoglobin 15.1 13.0 - 17.0 g/dL   HCT 44.3 39.0 - 52.0 %   MCV 87.9 80.0 - 100.0 fL   MCH 30.0 26.0 - 34.0 pg   MCHC 34.1 30.0 - 36.0 g/dL   RDW 14.7 11.5 - 15.5 %   Platelets 74 (L) 150 - 400 K/uL    Comment: Immature Platelet Fraction may be clinically indicated, consider ordering this additional test [id number] CONSISTENT WITH PREVIOUS RESULT    nRBC 0.0 0.0 - 0.2 %    Comment: Performed at [facility], [address], [place], [place] [address]  Urinalysis, Routine w reflex microscopic     Status: Abnormal   Collection Time: 12/27/17 12:00 PM  Result Value Ref Range   Color, Urine STRAW (A) YELLOW   APPearance CLEAR CLEAR   Specific Gravity, Urine 1.023 1.005 - 1.030   pH 6.0 5.0 -  8.0   Glucose, UA >=500 (A) NEGATIVE mg/dL   Hgb urine dipstick NEGATIVE NEGATIVE   Bilirubin Urine NEGATIVE NEGATIVE   Ketones, ur 5 (A) NEGATIVE mg/dL   Protein, ur NEGATIVE NEGATIVE mg/dL   Nitrite NEGATIVE NEGATIVE   Leukocytes, UA NEGATIVE NEGATIVE   RBC / HPF 0-5 0 - 5 RBC/hpf   WBC, UA 0-5 0 - 5 WBC/hpf   Bacteria, UA NONE SEEN NONE SEEN    Comment: Performed at [facility], [address], [place], [place] [address]  Protime-INR     Status: None   Collection Time: 12/27/17 12:01 PM  Result Value Ref Range   Prothrombin Time 12.3 11.4 - 15.2 seconds   INR 0.93     Comment: Performed at [facility], [address] [place], [place] [address]  CBG monitoring, ED     Status: Abnormal   Collection Time: 12/27/17 12:23 PM  Result Value Ref Range   Glucose-Capillary >600 (HH) 70 - 99 mg/dL   CBG monitoring, ED     Status: Abnormal   Collection Time: 12/27/17  2:01 PM  Result Value Ref Range   Glucose-Capillary 384 (H) 70 - 99 mg/dL  CBG monitoring, ED     Status: Abnormal   Collection Time: 12/27/17  3:09 PM  Result Value Ref Range   Glucose-Capillary 441 (H) 70 - 99 mg/dL  Blood gas, venous     Status: Abnormal   Collection Time: 12/27/17  3:37 PM  Result Value Ref Range   FIO2 0.21    pH, Ven 7.429 7.250 - 7.430   pCO2, Ven 39.5 (L) 44.0 - 60.0 mmHg   pO2, Ven 62.8 (H) 32.0 - 45.0 mmHg   Bicarbonate 25.9 20.0 - 28.0 mmol/L   Acid-Base Excess 1.7 0.0 - 2.0 mmol/L   O2 Saturation 91.7 %   Patient temperature 37.0    Collection site VENOUS    Drawn by DRAWN BY RN    Sample type VENOUS     Comment: Performed at [facility], [address] [place], [place] [address]  HIV antibody (Routine Testing)     Status: None   Collection Time: 12/27/17  3:37 PM  Result Value Ref Range   HIV Screen 4th Generation wRfx Non Reactive Non Reactive    Comment: (NOTE) Performed At: [facility] [address] [place], [place] [id number] Rush Farmer MD [id number]   CBG monitoring, ED     Status: Abnormal   Collection Time: 12/27/17  4:23 PM  Result Value Ref Range   Glucose-Capillary 224 (H) 70 - 99 mg/dL  MRSA PCR Screening     Status: None   Collection Time: 12/27/17  4:45 PM  Result Value Ref Range   MRSA by PCR NEGATIVE NEGATIVE    Comment:        The GeneXpert MRSA Assay (FDA approved for NASAL specimens only), is one component of a comprehensive MRSA colonization surveillance program. It is not intended to diagnose MRSA infection nor to guide or monitor treatment for MRSA infections. Performed at [facility], [address], [place], [place] [address]   Glucose, capillary     Status: Abnormal   Collection Time: 12/27/17  5:21 PM  Result Value Ref Range   Glucose-Capillary 230 (H) 70 - 99 mg/dL  Urinalysis, Routine w reflex microscopic     Status:  Abnormal   Collection Time: 12/27/17  5:24 PM  Result Value Ref Range   Color, Urine STRAW (A) YELLOW   APPearance  CLEAR CLEAR   Specific Gravity, Urine 1.025 1.005 - 1.030   pH 5.0 5.0 - 8.0   Glucose, UA >=500 (A) NEGATIVE mg/dL   Hgb urine dipstick NEGATIVE NEGATIVE   Bilirubin Urine NEGATIVE NEGATIVE   Ketones, ur 5 (A) NEGATIVE mg/dL   Protein, ur 30 (A) NEGATIVE mg/dL   Nitrite NEGATIVE NEGATIVE   Leukocytes, UA NEGATIVE NEGATIVE   RBC / HPF 0-5 0 - 5 RBC/hpf   WBC, UA 0-5 0 - 5 WBC/hpf   Bacteria, UA RARE (A) NONE SEEN   Hyaline Casts, UA PRESENT     Comment: Performed at [facility], [address] [place], [place] [address]  Lactic acid, plasma     Status: Abnormal   Collection Time: 12/27/17  6:25 PM  Result Value Ref Range   Lactic Acid, Venous 2.4 (HH) 0.5 - 1.9 mmol/L    Comment: CRITICAL RESULT CALLED TO, READ BACK BY AND VERIFIED WITH: HARRIS,B ON 12/27/17 AT 1945 BY LOY,C Performed at [facility], [address], [place], [place] [address]   Glucose, capillary     Status: None   Collection Time: 12/27/17  6:27 PM  Result Value Ref Range   Glucose-Capillary 89 70 - 99 mg/dL  Lactic acid, plasma     Status: None   Collection Time: 12/27/17  9:33 PM  Result Value Ref Range   Lactic Acid, Venous 1.8 0.5 - 1.9 mmol/L    Comment: Performed at [facility], [address] [place], [place] [address]  Glucose, capillary     Status: Abnormal   Collection Time: 12/27/17  9:36 PM  Result Value Ref Range   Glucose-Capillary 299 (H) 70 - 99 mg/dL  Comprehensive metabolic panel     Status: Abnormal   Collection Time: 12/28/17  3:52 AM  Result Value Ref Range   Sodium 137 135 - 145 mmol/L    Comment: DELTA CHECK NOTED   Potassium 3.9 3.5 - 5.1 mmol/L    Comment: DELTA CHECK NOTED   Chloride 103 98 - 111 mmol/L   CO2 26 22 - 32 mmol/L   Glucose, Bld 261 (H) 70 - 99 mg/dL   BUN 21 8 - 23 mg/dL   Creatinine, Ser 0.81 0.61 - 1.24 mg/dL   Calcium 7.2 (L) 8.9 - 10.3  mg/dL   Total Protein 4.6 (L) 6.5 - 8.1 g/dL   Albumin 2.5 (L) 3.5 - 5.0 g/dL   AST 29 15 - 41 U/L   ALT 31 0 - 44 U/L   Alkaline Phosphatase 89 38 - 126 U/L   Total Bilirubin 0.8 0.3 - 1.2 mg/dL   GFR calc non Af Amer >60 >60 mL/min   GFR calc Af Amer >60 >60 mL/min    Comment: (NOTE) The eGFR has been calculated using the CKD EPI equation. This calculation has not been validated in all clinical situations. eGFR's persistently <60 mL/min signify possible Chronic Kidney Disease.    Anion gap 8 5 - 15    Comment: Performed at [facility], [address] [place], [place] [address]  Glucose, capillary     Status: Abnormal   Collection Time: 12/28/17  8:13 AM  Result Value Ref Range   Glucose-Capillary 256 (H) 70 - 99 mg/dL  Glucose, capillary     Status: Abnormal   Collection Time: 12/28/17 11:49 AM  Result Value Ref Range   Glucose-Capillary 412 (H) 70 - 99 mg/dL  Glucose, capillary     Status: Abnormal   Collection  Time: 12/28/17  1:07 PM  Result Value Ref Range   Glucose-Capillary 501 (HH) 70 - 99 mg/dL   Comment 1 Notify RN    Comment 2 Document in Chart   Glucose, capillary     Status: Abnormal   Collection Time: 12/28/17  3:01 PM  Result Value Ref Range   Glucose-Capillary 397 (H) 70 - 99 mg/dL  Glucose, capillary     Status: Abnormal   Collection Time: 12/28/17  4:53 PM  Result Value Ref Range   Glucose-Capillary 199 (H) 70 - 99 mg/dL  Glucose, capillary     Status: Abnormal   Collection Time: 12/28/17  9:17 PM  Result Value Ref Range   Glucose-Capillary 171 (H) 70 - 99 mg/dL  Glucose, capillary     Status: Abnormal   Collection Time: 12/29/17  7:50 AM  Result Value Ref Range   Glucose-Capillary 229 (H) 70 - 99 mg/dL    ABGS Recent Labs    12/27/17 1537  HCO3 25.9   CULTURES Recent Results (from the past 240 hour(s))  MRSA PCR Screening     Status: None   Collection Time: 12/27/17  4:45 PM  Result Value Ref Range Status   MRSA by PCR NEGATIVE NEGATIVE  Final    Comment:        The GeneXpert MRSA Assay (FDA approved for NASAL specimens only), is one component of a comprehensive MRSA colonization surveillance program. It is not intended to diagnose MRSA infection nor to guide or monitor treatment for MRSA infections. Performed at [facility], [address] [place], [place] [address]    Studies/Results: No results found.  Medications:  Prior to Admission:  Medications Prior to Admission  Medication Sig Dispense Refill Last Dose  . AMITIZA 24 MCG capsule Take 24 mcg by mouth 2 (two) times a week.    Past Week at Unknown time  . carvedilol (COREG) 12.5 MG tablet TAKE 1 TABLET(12.5 MG) BY MOUTH TWICE DAILY (Patient taking differently: Take 12.5 mg by mouth 2 (two) times daily with a meal. ) 180 tablet 3 12/27/2017 at 830  . dexamethasone (DECADRON) 4 MG tablet Take 1 tablet (4 mg total) by mouth 2 (two) times daily. 60 tablet 0 12/27/2017 at Unknown time  . ENTRESTO 49-51 MG TAKE 1 TABLET BY MOUTH TWICE DAILY (Patient taking differently: Take 1 tablet by mouth 2 (two) times daily. ) 180 tablet 3 12/27/2017 at 830  . furosemide (LASIX) 40 MG tablet Alternate 40 mg one day and 20 mg the next (Patient taking differently: Take 40 mg by mouth daily. ) 90 tablet 3 12/27/2017 at Unknown time  . glipiZIDE (GLUCOTROL XL) 10 MG 24 hr tablet Take 1 tablet (10 mg total) by mouth daily with breakfast. 30 tablet 2 12/27/2017 at Unknown time  . Insulin Glargine (BASAGLAR KWIKPEN) 100 UNIT/ML SOPN 12 Units daily.   12/27/2017 at Unknown time  . lidocaine-prilocaine (EMLA) cream Apply small amount over port site and cover with plastic wrap one hour prior to appointment. 30 g 0 Taking  . linagliptin (TRADJENTA) 5 MG TABS tablet Take 5 mg by mouth daily.   12/27/2017 at Unknown time  . metFORMIN (GLUCOPHAGE) 500 MG tablet TAKE 1 TABLET(500 MG) BY MOUTH TWICE DAILY WITH A MEAL 60 tablet 2 Past Week at Unknown time  . milk thistle 175 MG tablet Take 175 mg  by mouth daily.   Past Week at Unknown time  . OLANZapine (ZYPREXA) 5 MG tablet Take 5 mg by  mouth 2 (two) times daily.   12/27/2017 at Unknown time  . ondansetron (ZOFRAN ODT) 8 MG disintegrating tablet Take 1 tablet (8 mg total) by mouth every 8 (eight) hours as needed for nausea or vomiting. 40 tablet 2 Past Week at Unknown time  . OXALIPLATIN IV Inject into the vein.   Taking  . pantoprazole (PROTONIX) 40 MG tablet Take 1 tablet (40 mg total) by mouth 2 (two) times daily before a meal. 60 tablet 3 12/27/2017 at Unknown time  . potassium chloride SA (K-DUR,KLOR-CON) 20 MEQ tablet Take 20 mEq by mouth daily.   12/27/2017 at Unknown time  . prochlorperazine (COMPAZINE) 10 MG tablet Take 1 tablet (10 mg total) by mouth every 6 (six) hours as needed for nausea or vomiting. 30 tablet 3 Taking  . rosuvastatin (CRESTOR) 10 MG tablet Take 1 tablet (10 mg total) by mouth daily. 90 tablet 3 12/27/2017 at Unknown time  . vitamin C (ASCORBIC ACID) 250 MG tablet Take by mouth.   12/27/2017 at Unknown time  . acetaminophen (TYLENOL) 325 MG tablet Take 650 mg by mouth every 6 (six) hours as needed for moderate pain.    Taking  . B-D UF III MINI PEN NEEDLES 31G X 5 MM MISC    Taking  . Blood Glucose Monitoring Suppl (ONETOUCH VERIO) w/Device KIT 1 each by Does not apply route as needed. 1 kit 0 Taking  . fluorouracil CALGB 95284 in sodium chloride 0.9 % 150 mL Inject into the vein continuous. Over 46 hours   Taking  . glucose blood test strip 1 each by Other route 4 (four) times daily. Use as instructed qid One Touch ultra 150 each 5 Taking   Scheduled: . carvedilol  12.5 mg Oral BID WC  . dexamethasone  4 mg Oral BID  . furosemide  40 mg Oral Daily  . gabapentin  100 mg Oral BID  . insulin aspart  0-20 Units Subcutaneous TID WC  . insulin aspart  0-5 Units Subcutaneous QHS  . insulin glargine  30 Units Subcutaneous QHS  . lubiprostone  24 mcg Oral Once per day on Mon Thu  . OLANZapine  5 mg Oral BID  .  pantoprazole  40 mg Oral BID AC  . rosuvastatin  10 mg Oral Daily  . sucralfate  1 g Oral TID WC & HS   Continuous: . sodium chloride 50 mL/hr at 12/28/17 1748   XLK:[id number], fentaNYL (SUBLIMAZE) injection, ondansetron, prochlorperazine, senna  Assesment: He is admitted with blood sugar issues that has not been controlled.  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  We were trying to get him set up for radiation to his brain and apparently that may be able to be done tomorrow with transfer him to [place] to come back here.  Will discuss with case management.  His blood sugar is better and I have increased his Lantus again today.  He still complains of pain when he swallows which I think is related to his cancer  He has acute on chronic renal failure which is better  He has schizoaffective disorder with bipolar disease which complicates his treatment.  I think he had very poor understanding of his situation so palliative care consultation was very helpful. Active Problems:   DM type 2 causing vascular disease ([place])   Essential hypertension, benign   Acute on chronic diastolic heart failure (HCC)   Schizoaffective disorder, bipolar type ([place])  CAD (coronary artery disease)   Acute renal failure superimposed on stage 3 chronic kidney disease (HCC)   Dysphagia   Malignant neoplasm of cardio-esophageal junction (HCC)   Malignant neoplasm of esophagus (HCC)   Brain metastasis ([place])   Hyperglycemia without ketosis   DNR (do not resuscitate)    Plan: Continue treatments.  See if we can arrange for him to be transferred for radiation treatment and then come back working on placement once he is better    LOS: 2 days   Jai Bear L 12/29/2017, 8:43 AM

## 2017-12-29 NOTE — Telephone Encounter (Signed)
Spoke with [place] nurse, Caryl Pina, requesting [facility] travel be set up for Mr. [name].  Plan is for Dr. Sherwood Gambler to meet with Mr. Fauteux at 10:30 and then we will complete the scheduled Brain radiosurgery treatment.   Pt. needs to arrive at [facility] at 10:15am on Thursday 11/14.   Mont Dutton R.T.(R)(T) Special Procedures Navigator

## 2017-12-30 ENCOUNTER — Encounter: Payer: Self-pay | Admitting: Radiation Oncology

## 2017-12-30 ENCOUNTER — Telehealth: Payer: Self-pay | Admitting: Radiation Therapy

## 2017-12-30 ENCOUNTER — Ambulatory Visit
Admission: RE | Admit: 2017-12-30 | Discharge: 2017-12-30 | Disposition: A | Discharge status: Home / Self Care | Payer: Medicare Other | Source: Ambulatory Visit | Attending: Radiation Oncology | Admitting: Radiation Oncology

## 2017-12-30 DIAGNOSIS — C7931 Secondary malignant neoplasm of brain: Secondary | ICD-10-CM

## 2017-12-30 LAB — GLUCOSE, CAPILLARY
GLUCOSE-CAPILLARY: 258 mg/dL — AB (ref 70–99)
GLUCOSE-CAPILLARY: 353 mg/dL — AB (ref 70–99)
GLUCOSE-CAPILLARY: 288 mg/dL — AB (ref 70–99)
GLUCOSE-CAPILLARY: 329 mg/dL — AB (ref 70–99)
Glucose-Capillary: 254 mg/dL — ABNORMAL HIGH (ref 70–99)

## 2017-12-30 MED ORDER — DEXAMETHASONE 4 MG PO TABS
2.0000 mg | ORAL_TABLET | Freq: Two times a day (BID) | ORAL | Status: DC
  Administered 2017-12-30 – 2018-01-01 (×4): 2 mg via ORAL
  Filled 2017-12-30 (×4): qty 1

## 2017-12-30 MED ORDER — INSULIN GLARGINE 100 UNIT/ML ~~LOC~~ SOLN
35.0000 [IU] | Freq: Every day | SUBCUTANEOUS | Status: DC
  Administered 2017-12-30 – 2017-12-31 (×2): 35 [IU] via SUBCUTANEOUS
  Filled 2017-12-30 (×4): qty 0.35

## 2017-12-30 MED ORDER — SODIUM CHLORIDE 0.9 % IV SOLN: Freq: Once | INTRAVENOUS | Status: DC

## 2017-12-30 MED ORDER — DEXAMETHASONE 4 MG PO TABS: 2.0000 mg | ORAL_TABLET | Freq: Every day | ORAL | Status: DC

## 2017-12-30 MED ORDER — DEXAMETHASONE 4 MG PO TABS: 2.0000 mg | ORAL_TABLET | ORAL | Status: DC

## 2017-12-30 MED ORDER — INSULIN ASPART 100 UNIT/ML ~~LOC~~ SOLN
4.0000 [IU] | Freq: Three times a day (TID) | SUBCUTANEOUS | Status: DC
  Administered 2017-12-30 – 2018-01-01 (×7): 4 [IU] via SUBCUTANEOUS

## 2017-12-30 MED ORDER — ALUM & MAG HYDROXIDE-SIMETH 200-200-20 MG/5ML PO SUSP: 30.0000 mL | ORAL | Status: DC | PRN

## 2017-12-30 MED ORDER — LIDOCAINE VISCOUS HCL 2 % MT SOLN
15.0000 mL | Freq: Once | OROMUCOSAL | Status: AC
  Administered 2017-12-30: 15 mL via ORAL
  Filled 2017-12-30: qty 15

## 2017-12-30 NOTE — Progress Notes (Signed)
Inpatient Diabetes Program Recommendations  AACE/ADA: New Consensus Statement on Inpatient Glycemic Control (2015)  Target Ranges:  Prepandial:   less than 140 mg/dL      Peak postprandial:   less than 180 mg/dL (1-2 hours)      Critically ill patients:  140 - 180 mg/dL   Results for STATON, MARKEY (MRN [id number]) as of 12/30/2017 06:58  Ref. Range 12/29/2017 07:50 12/29/2017 11:25 12/29/2017 16:17 12/29/2017 21:19  Glucose-Capillary Latest Ref Range: 70 - 99 mg/dL 229 (H)  7 units NOVOLOG  399 (H)  20 units NOVOLOG  385 (H)  20 units NOVOLOG  257 (H)  3 units NOVOLOG +  30 units LANTUS     Home DM meds: Basaglar 12 units Daily                             Glipizide 10 mg Daily                             Tradjenta 5 mg daily                             Metformin 500 mg BID   Current Orders: Lantus 30 units QHS                             Novolog Resistant Correction Scale/ SSI (0-20 units) TID AC + HS       Getting Decadron 4 mg BID.    Note Lantus dose increased last PM.    MD- Please also consider starting Novolog Meal Coverage while patient remains on Decadron:  Novolog 10 units TID with meals  (Please add the following Hold Parameters: Hold if pt eats <50% of meal, Hold if pt NPO)     --Will follow patient during hospitalization--  Wyn Quaker RN, MSN, CDE Diabetes Coordinator Inpatient Glycemic Control Team Team Pager: [phone] (8a-5p)

## 2017-12-30 NOTE — Progress Notes (Signed)
Pt requested to be walked to stretch legs. Pt was walked out the room to the [place] room and back with out incident and sitting in chair. Marland Kitchen

## 2017-12-30 NOTE — Progress Notes (Signed)
Reason for Consult:  Solitary right cerebellar brain metastasis  Referring Physician:  Dr. Kyung Rudd  [name] is an 67 y.o. male.  HPI: Patient is a 67 year old white male with a history of metastatic esophageal cancer who is been under treatment with Dr. Delton Coombes at the [facility] cancer center.  He apparently had some difficulties with memory, and a CT of the head was done which revealed a metastasis in the right cerebellum, and the patient was seen in radiation oncology consultation by Dr. Kyung Rudd who has recommended stereotactic radiosurgery.  The patient had been scheduled for outpatient pretreatment neurosurgical consultation by office earlier this week, but was admitted to [facility] because of severe hyperglycemia earlier this week.  He continues under treatment as an inpatient at [facility], but arrangements were made for transfer to the radiation oncology department for treatment today.  The patient was seen prior to treatment, in the outpatient radiation oncology department, accompanied by his cousin and power of attorney, [name].  Dramatically they notes some unsteady gait for several weeks but the patient denies any headaches, diplopia, blurred vision, weakness, or seizures.  They do note that he had some blurred vision when he presented earlier this week with severe hyperglycemia (serum blood glucose in the 700s).  His cousin notes that in fact he has had hypoglycemia for many months, but it was worse earlier this week.  She also notes that as regards his esophageal cancer, he was diagnosed over a year ago.  He has had 2 rounds of chemotherapy but that has been on hold because of thrombocytopenia.  He apparently was found to have liver metastasis to 3 months ago and the solitary brain metastasis 2 to 3 weeks ago.  Past Medical History:  Past Medical History:  Diagnosis Date  . Arthritis   . Bipolar disorder ([place])   . Brain cancer ([place]) 2019  . CAD (coronary  artery disease) 08/16/2016  . Cancer ([place])    esophageal with liver mets  . Cellulitis and abscess of foot 03/05/2016  . Chronic combined systolic and diastolic heart failure ([place])   . Diabetes mellitus without complication ([place])    boderline  . GERD (gastroesophageal reflux disease)   . Heart murmur 1995  . History of kidney stones   . Hypercholesteremia   . Hypertension   . Kidney stones   . Liver cancer ([place]) 2019  . Myocardial infarction ([place])   . OSA (obstructive sleep apnea)    no cpap, can't tolerate  . Schizoaffective disorder, bipolar type ([place]) 04/25/2016    Past Surgical History:  Past Surgical History:  Procedure Laterality Date  . APPENDECTOMY    . BIOPSY  03/09/2017   Procedure: BIOPSY;  Surgeon: Danie Binder, MD;  [facility];  Service: Endoscopy;;  gastric esophageal  . BIOPSY  11/23/2017   Procedure: BIOPSY;  Surgeon: Danie Binder, MD;  [facility];  Service: Endoscopy;;  esophagus  . CHOLECYSTECTOMY N/A 10/20/2013   Procedure: LAPAROSCOPIC CHOLECYSTECTOMY;  Surgeon: [name], MD;  Location: [facility];  Service: General;  Laterality: N/A;  . COLONOSCOPY WITH PROPOFOL N/A 03/09/2017   Procedure: COLONOSCOPY WITH PROPOFOL;  Surgeon: Danie Binder, MD;  [facility];  Service: Endoscopy;  Laterality: N/A;  12:15pm  . ESOPHAGOGASTRODUODENOSCOPY (EGD) WITH PROPOFOL N/A 03/09/2017   Procedure: ESOPHAGOGASTRODUODENOSCOPY (EGD) WITH PROPOFOL;  Surgeon: Danie Binder, MD;  [facility];  Service: Endoscopy;  Laterality: N/A;  .  ESOPHAGOGASTRODUODENOSCOPY (EGD) WITH PROPOFOL N/A 11/23/2017   Procedure: ESOPHAGOGASTRODUODENOSCOPY (EGD) WITH PROPOFOL;  Surgeon: Danie Binder, MD;  [facility];  Service: Endoscopy;  Laterality: N/A;  10:00am  . EUS N/A 04/08/2017   Procedure: UPPER ENDOSCOPIC ULTRASOUND (EUS) RADIAL;  Surgeon: Milus Banister, MD;  Location: [facility];  Service: Endoscopy;  Laterality: N/A;  .  HIP SURGERY Left   . KNEE SURGERY Left   . POLYPECTOMY  03/09/2017   Procedure: POLYPECTOMY;  Surgeon: Danie Binder, MD;  [facility];  Service: Endoscopy;;  colon   . PORTACATH PLACEMENT Left 04/07/2017   Procedure: INSERTION PORT-A-CATH LEFT SUBCLAVIAN;  Surgeon: Virl Cagey, MD;  Location: [facility];  Service: General;  Laterality: Left;  . SAVORY DILATION N/A 03/09/2017   Procedure: SAVORY DILATION;  Surgeon: Danie Binder, MD;  [facility];  Service: Endoscopy;  Laterality: N/A;    Family History:  Family History  Problem Relation Age of Onset  . Hypertension Mother   . Dementia Mother   . Cancer Mother        breast  . Colon cancer Neg Hx   . Colon polyps Neg Hx     Social History:  reports that he has never smoked. He has never used smokeless tobacco. He reports that he does not drink alcohol or use drugs.  Allergies:  Allergies  Allergen Reactions  . Codeine Nausea And Vomiting  . Morphine And Related Nausea And Vomiting    Medications: I have reviewed the patient's current medications.  ROS:  As described in HPI and PMHx.  Physical Examination: WDWNWM in NAD  Neurological Examination: Mental Status Examination:  Awake, alert, oriented to name, [facility], [place], and 2019.  Following commands.  Speech fluent.  Cranial Nerve Examination:  EOMI.  Face sensation intact.  Facial movement symmetrical. Hearing intact. Palatal movement symmetrical.  Shoulder shrug symmetrical. Tongue midline.  Motor Examination:  UE: 5/5, no drift of UEs.  LE: 5/5 except left dorsiflexor which is 0-1/5 due to old football injury.  Sensory Examination:  Intact to pinprick throughout.  Reflex Examination:   Symmetrical.   Results for orders placed or performed during the hospital encounter of 12/27/17 (from the past 48 hour(s))  Glucose, capillary     Status: Abnormal   Collection Time: 12/28/17 11:49 AM  Result Value Ref Range   Glucose-Capillary 412 (H) 70 - 99  mg/dL  Glucose, capillary     Status: Abnormal   Collection Time: 12/28/17  1:07 PM  Result Value Ref Range   Glucose-Capillary 501 (HH) 70 - 99 mg/dL   Comment 1 Notify RN    Comment 2 Document in Chart   Glucose, capillary     Status: Abnormal   Collection Time: 12/28/17  3:01 PM  Result Value Ref Range   Glucose-Capillary 397 (H) 70 - 99 mg/dL  Glucose, capillary     Status: Abnormal   Collection Time: 12/28/17  4:53 PM  Result Value Ref Range   Glucose-Capillary 199 (H) 70 - 99 mg/dL  Glucose, capillary     Status: Abnormal   Collection Time: 12/28/17  9:17 PM  Result Value Ref Range   Glucose-Capillary 171 (H) 70 - 99 mg/dL  Glucose, capillary     Status: Abnormal   Collection Time: 12/29/17  7:50 AM  Result Value Ref Range   Glucose-Capillary 229 (H) 70 - 99 mg/dL  Glucose, capillary     Status: Abnormal   Collection  Time: 12/29/17 11:25 AM  Result Value Ref Range   Glucose-Capillary 399 (H) 70 - 99 mg/dL  Glucose, capillary     Status: Abnormal   Collection Time: 12/29/17  4:17 PM  Result Value Ref Range   Glucose-Capillary 385 (H) 70 - 99 mg/dL  Glucose, capillary     Status: Abnormal   Collection Time: 12/29/17  9:19 PM  Result Value Ref Range   Glucose-Capillary 257 (H) 70 - 99 mg/dL  Glucose, capillary     Status: Abnormal   Collection Time: 12/30/17  7:41 AM  Result Value Ref Range   Glucose-Capillary 258 (H) 70 - 99 mg/dL    Assessment/Plan: Patient with metastatic esophageal cancer with liver metastases and a solitary right cerebellar brain metastasis.  He is scheduled for stereotactic radiosurgery with Dr. Kyung Rudd (radiation oncology) who requested neurosurgical consultation.  I reviewed the patient's MRI as well as his [facility] plan, and agree with proceeding with treatment.  I have discussed the condition and plans for treatment with the patient and his cousin, and their questions regarding were answered for them.  Posttreatment follow-up will be coordinated  by Mont Dutton, the [facility] navigator.  Hosie Spangle, MD 12/30/2017, 11:42 AM

## 2017-12-30 NOTE — Progress Notes (Signed)
Palliative:  [name] has gone for his radiation treatment when I come to see him today. No concerns per nursing.   I called and spoke with Frazier Butt, [facility] regarding getting HCPOA notarized today. This is very important. Please ensure that HCPOA is notarized prior to d/c.   There are no palliative services available at [facility] until 01/03/18.   No charge  Vinie Sill, NP Palliative Medicine Team Pager # [phone] (M-F 8a-5p) Team Phone # [phone] (Nights/Weekends)

## 2017-12-30 NOTE — Progress Notes (Signed)
Stereotactic Radiosurgery Operative Note  Name: [name] MRN: [id number]  Date: 12/30/2017  DOB: Nov 22, 1950  Op Note  Pre Operative Diagnosis:  Solitary brain metastasis from metastatic esophageal cancer  Post Operative Diagnois:  Solitary brain metastasis from metastatic esophageal cancer  3D TREATMENT PLANNING AND DOSIMETRY:  The patient's radiation plan was reviewed and approved by myself (neurosurgery) and Dr. Kyung Rudd (radiation oncology) prior to treatment.  It showed 3-dimensional radiation distributions overlaid onto the planning CT/MRI image set.  The [facility] for the target structures as well as the organs at risk were reviewed. The documentation of the 3D plan and dosimetry are filed in the radiation oncology EMR.  NARRATIVE:  [name] was brought to the TrueBeam stereotactic radiation treatment machine and placed supine on the CT couch. The head frame was applied, and the patient was set up for stereotactic radiosurgery.  I was present for the set-up and delivery.  SIMULATION VERIFICATION:  In the couch zero-angle position, the patient underwent Exactrac imaging using the Brainlab system with orthogonal KV images.  These were carefully aligned and repeated to confirm treatment position for each of the isocenters.  The Exactrac snap film verification was repeated at each couch angle.  SPECIAL TREATMENT PROCEDURE: [name] received stereotactic radiosurgery to the following targets: Right cerebellar target was treated using 5 Rapid Arc VMAT Beams to a prescription dose of 20 Gy.  ExacTrac registration was performed for each couch angle.  The 100% isodose line was prescribed.  STEREOTACTIC TREATMENT MANAGEMENT:  Following delivery, the patient was transported to nursing in stable condition and monitored for possible acute effects.  Vital signs were recorded. The patient tolerated treatment without significant acute effects, and was discharged to home in stable condition.     PLAN: Follow-up in one month.

## 2017-12-30 NOTE — Progress Notes (Signed)
Subjective: He says he feels okay.  He still has trouble with swallowing.  He is scheduled to go to the cancer center for radiation treatment today.  His blood sugar is still elevated  Objective: Vital signs in last 24 hours: Temp:  [97.1 F (36.2 C)-98 F (36.7 C)] 97.2 F (36.2 C) (11/14 0744) Pulse Rate:  [65-90] 71 (11/14 0744) Resp:  [10-33] 15 (11/14 0744) BP: (88-153)/(58-98) 145/83 (11/14 0600) SpO2:  [91 %-98 %] 96 % (11/14 0744) Weight:  [79.6 kg] 79.6 kg (11/14 0600) Weight change: -2.2 kg Last BM Date: 12/22/17  Intake/Output from previous day: 11/13 0701 - 11/14 0700 In: 417 [I.V.:417] Out: -   PHYSICAL EXAM General appearance: alert, cooperative and no distress Resp: clear to auscultation bilaterally Cardio: regular rate and rhythm, S1, S2 normal, no murmur, click, rub or gallop GI: soft, non-tender; bowel sounds normal; no masses,  no organomegaly Extremities: extremities normal, atraumatic, no cyanosis or edema  Lab Results:  Results for orders placed or performed during the hospital encounter of 12/27/17 (from the past 48 hour(s))  Glucose, capillary     Status: Abnormal   Collection Time: 12/28/17 11:49 AM  Result Value Ref Range   Glucose-Capillary 412 (H) 70 - 99 mg/dL  Glucose, capillary     Status: Abnormal   Collection Time: 12/28/17  1:07 PM  Result Value Ref Range   Glucose-Capillary 501 (HH) 70 - 99 mg/dL   Comment 1 Notify RN    Comment 2 Document in Chart   Glucose, capillary     Status: Abnormal   Collection Time: 12/28/17  3:01 PM  Result Value Ref Range   Glucose-Capillary 397 (H) 70 - 99 mg/dL  Glucose, capillary     Status: Abnormal   Collection Time: 12/28/17  4:53 PM  Result Value Ref Range   Glucose-Capillary 199 (H) 70 - 99 mg/dL  Glucose, capillary     Status: Abnormal   Collection Time: 12/28/17  9:17 PM  Result Value Ref Range   Glucose-Capillary 171 (H) 70 - 99 mg/dL  Glucose, capillary     Status: Abnormal   Collection  Time: 12/29/17  7:50 AM  Result Value Ref Range   Glucose-Capillary 229 (H) 70 - 99 mg/dL  Glucose, capillary     Status: Abnormal   Collection Time: 12/29/17 11:25 AM  Result Value Ref Range   Glucose-Capillary 399 (H) 70 - 99 mg/dL  Glucose, capillary     Status: Abnormal   Collection Time: 12/29/17  4:17 PM  Result Value Ref Range   Glucose-Capillary 385 (H) 70 - 99 mg/dL  Glucose, capillary     Status: Abnormal   Collection Time: 12/29/17  9:19 PM  Result Value Ref Range   Glucose-Capillary 257 (H) 70 - 99 mg/dL  Glucose, capillary     Status: Abnormal   Collection Time: 12/30/17  7:41 AM  Result Value Ref Range   Glucose-Capillary 258 (H) 70 - 99 mg/dL    ABGS Recent Labs    12/27/17 1537  HCO3 25.9   CULTURES Recent Results (from the past 240 hour(s))  MRSA PCR Screening     Status: None   Collection Time: 12/27/17  4:45 PM  Result Value Ref Range Status   MRSA by PCR NEGATIVE NEGATIVE Final    Comment:        The GeneXpert MRSA Assay (FDA approved for NASAL specimens only), is one component of a comprehensive MRSA colonization surveillance program. It is not intended  to diagnose MRSA infection nor to guide or monitor treatment for MRSA infections. Performed at [facility], [address], [place], [place] [address]    Studies/Results: No results found.  Medications:  Prior to Admission:  Medications Prior to Admission  Medication Sig Dispense Refill Last Dose  . AMITIZA 24 MCG capsule Take 24 mcg by mouth 2 (two) times a week.    Past Week at Unknown time  . carvedilol (COREG) 12.5 MG tablet TAKE 1 TABLET(12.5 MG) BY MOUTH TWICE DAILY (Patient taking differently: Take 12.5 mg by mouth 2 (two) times daily with a meal. ) 180 tablet 3 12/27/2017 at 830  . dexamethasone (DECADRON) 4 MG tablet Take 1 tablet (4 mg total) by mouth 2 (two) times daily. 60 tablet 0 12/27/2017 at Unknown time  . ENTRESTO 49-51 MG TAKE 1 TABLET BY MOUTH TWICE DAILY (Patient taking  differently: Take 1 tablet by mouth 2 (two) times daily. ) 180 tablet 3 12/27/2017 at 830  . furosemide (LASIX) 40 MG tablet Alternate 40 mg one day and 20 mg the next (Patient taking differently: Take 40 mg by mouth daily. ) 90 tablet 3 12/27/2017 at Unknown time  . glipiZIDE (GLUCOTROL XL) 10 MG 24 hr tablet Take 1 tablet (10 mg total) by mouth daily with breakfast. 30 tablet 2 12/27/2017 at Unknown time  . Insulin Glargine (BASAGLAR KWIKPEN) 100 UNIT/ML SOPN 12 Units daily.   12/27/2017 at Unknown time  . lidocaine-prilocaine (EMLA) cream Apply small amount over port site and cover with plastic wrap one hour prior to appointment. 30 g 0 Taking  . linagliptin (TRADJENTA) 5 MG TABS tablet Take 5 mg by mouth daily.   12/27/2017 at Unknown time  . metFORMIN (GLUCOPHAGE) 500 MG tablet TAKE 1 TABLET(500 MG) BY MOUTH TWICE DAILY WITH A MEAL 60 tablet 2 Past Week at Unknown time  . milk thistle 175 MG tablet Take 175 mg by mouth daily.   Past Week at Unknown time  . OLANZapine (ZYPREXA) 5 MG tablet Take 5 mg by mouth 2 (two) times daily.   12/27/2017 at Unknown time  . ondansetron (ZOFRAN ODT) 8 MG disintegrating tablet Take 1 tablet (8 mg total) by mouth every 8 (eight) hours as needed for nausea or vomiting. 40 tablet 2 Past Week at Unknown time  . OXALIPLATIN IV Inject into the vein.   Taking  . pantoprazole (PROTONIX) 40 MG tablet Take 1 tablet (40 mg total) by mouth 2 (two) times daily before a meal. 60 tablet 3 12/27/2017 at Unknown time  . potassium chloride SA (K-DUR,KLOR-CON) 20 MEQ tablet Take 20 mEq by mouth daily.   12/27/2017 at Unknown time  . prochlorperazine (COMPAZINE) 10 MG tablet Take 1 tablet (10 mg total) by mouth every 6 (six) hours as needed for nausea or vomiting. 30 tablet 3 Taking  . rosuvastatin (CRESTOR) 10 MG tablet Take 1 tablet (10 mg total) by mouth daily. 90 tablet 3 12/27/2017 at Unknown time  . vitamin C (ASCORBIC ACID) 250 MG tablet Take by mouth.   12/27/2017 at Unknown  time  . acetaminophen (TYLENOL) 325 MG tablet Take 650 mg by mouth every 6 (six) hours as needed for moderate pain.    Taking  . B-D UF III MINI PEN NEEDLES 31G X 5 MM MISC    Taking  . Blood Glucose Monitoring Suppl (ONETOUCH VERIO) w/Device KIT 1 each by Does not apply route as needed. 1 kit 0 Taking  . fluorouracil CALGB 01027 in sodium  chloride 0.9 % 150 mL Inject into the vein continuous. Over 46 hours   Taking  . glucose blood test strip 1 each by Other route 4 (four) times daily. Use as instructed qid One Touch ultra 150 each 5 Taking   Scheduled: . carvedilol  12.5 mg Oral BID WC  . dexamethasone  4 mg Oral BID  . feeding supplement (GLUCERNA SHAKE)  237 mL Oral BID BM  . furosemide  40 mg Oral Daily  . gabapentin  100 mg Oral BID  . insulin aspart  0-20 Units Subcutaneous TID WC  . insulin aspart  0-5 Units Subcutaneous QHS  . insulin aspart  4 Units Subcutaneous TID WC  . insulin glargine  35 Units Subcutaneous QHS  . lubiprostone  24 mcg Oral Once per day on Mon Thu  . OLANZapine  5 mg Oral BID  . pantoprazole  40 mg Oral BID AC  . rosuvastatin  10 mg Oral Daily  . sucralfate  1 g Oral TID WC & HS   Continuous: . sodium chloride 50 mL/hr at 12/29/17 1420   JQG:[id number], fentaNYL (SUBLIMAZE) injection, lidocaine, ondansetron, prochlorperazine, senna  Assesment: He was admitted with uncontrolled diabetes.  Part of this is because he is on Decadron because he has brain metastatic disease from his previously known cancer of the esophagus.  He also has metastatic disease to liver.  He is scheduled for radiation treatment today.  His blood sugar is better but not controlled.  I am going to increase his insulin.  He has schizoaffective disorder with bipolar type and he has limited understanding.  Although we have talked about the fact that he is going to have to go to some sort of facility at discharge he still does not seem to remember that.  He has esophageal cancer and now  has DNR status  He has coronary disease but he is not having any chest pain  \He has heart failure which seems stable   Active Problems:   DM type 2 causing vascular disease ([place])   Essential hypertension, benign   Acute on chronic diastolic heart failure (HCC)   Schizoaffective disorder, bipolar type (HCC)   CAD (coronary artery disease)   Acute renal failure superimposed on stage 3 chronic kidney disease ([place])   Dysphagia   Malignant neoplasm of cardio-esophageal junction (HCC)   Malignant neoplasm of esophagus ([place])   Brain metastasis ([place])   Hyperglycemia without ketosis   DNR (do not resuscitate)   Odynophagia    Plan: For radiation treatment today.  Continue other treatments.    LOS: 3 days   Imberly Troxler L 12/30/2017, 8:47 AM

## 2017-12-30 NOTE — Telephone Encounter (Signed)
Mr. Morozov did very well for his SRS treatment procedure. No issues or complaints. The plan will be to see him back in 1 month for a follow-up with Dr. Ida Rogue PA-C, Shona Simpson, and then get another brain scan in 3 months.  Dr. Sherwood Gambler has requested that he begin a steroid taper today. Instructions have been shared with Mr. Fiddler's IP nurse, Thurmond Butts.   2mg  BID x 5 days 2mg  daily x 5 days 2mg  every other day x 5 doses and then stop  Mont Dutton R.T.(R)(T) Special Procedures Navigator

## 2017-12-31 ENCOUNTER — Encounter (HOSPITAL_COMMUNITY): Payer: Self-pay

## 2017-12-31 LAB — GLUCOSE, CAPILLARY
GLUCOSE-CAPILLARY: 171 mg/dL — AB (ref 70–99)
Glucose-Capillary: 250 mg/dL — ABNORMAL HIGH (ref 70–99)
Glucose-Capillary: 279 mg/dL — ABNORMAL HIGH (ref 70–99)
Glucose-Capillary: 316 mg/dL — ABNORMAL HIGH (ref 70–99)

## 2017-12-31 NOTE — Progress Notes (Signed)
Subjective: He says he feels okay.  He still does not have good understanding of what is going on.  Wants to know if he can go home and shave and come back.  His blood sugar is better.  We have a plan to get him off of Decadron now.  Objective: Vital signs in last 24 hours: Temp:  [97.5 F (36.4 C)-98.9 F (37.2 C)] 98.6 F (37 C) (11/15 0130) Pulse Rate:  [73-86] 86 (11/14 2300) Resp:  [9-26] 17 (11/15 0800) BP: (103-132)/(63-95) 132/95 (11/15 0800) SpO2:  [95 %-96 %] 96 % (11/14 2300) Weight change:  Last BM Date: 12/30/17  Intake/Output from previous day: 11/14 0701 - 11/15 0700 In: 452.2 [P.O.:360; I.V.:92.2] Out: 950 [Urine:950]  PHYSICAL EXAM General appearance: alert, cooperative and no distress Resp: clear to auscultation bilaterally Cardio: regular rate and rhythm, S1, S2 normal, no murmur, click, rub or gallop GI: soft, non-tender; bowel sounds normal; no masses,  no organomegaly Extremities: extremities normal, atraumatic, no cyanosis or edema  Lab Results:  Results for orders placed or performed during the hospital encounter of 12/27/17 (from the past 48 hour(s))  Glucose, capillary     Status: Abnormal   Collection Time: 12/29/17 11:25 AM  Result Value Ref Range   Glucose-Capillary 399 (H) 70 - 99 mg/dL  Glucose, capillary     Status: Abnormal   Collection Time: 12/29/17  4:17 PM  Result Value Ref Range   Glucose-Capillary 385 (H) 70 - 99 mg/dL  Glucose, capillary     Status: Abnormal   Collection Time: 12/29/17  9:19 PM  Result Value Ref Range   Glucose-Capillary 257 (H) 70 - 99 mg/dL  Glucose, capillary     Status: Abnormal   Collection Time: 12/30/17  7:41 AM  Result Value Ref Range   Glucose-Capillary 258 (H) 70 - 99 mg/dL  Glucose, capillary     Status: Abnormal   Collection Time: 12/30/17  3:11 PM  Result Value Ref Range   Glucose-Capillary 288 (H) 70 - 99 mg/dL  Glucose, capillary     Status: Abnormal   Collection Time: 12/30/17  4:28 PM   Result Value Ref Range   Glucose-Capillary 329 (H) 70 - 99 mg/dL  Glucose, capillary     Status: Abnormal   Collection Time: 12/30/17  9:10 PM  Result Value Ref Range   Glucose-Capillary 254 (H) 70 - 99 mg/dL   Comment 1 Notify RN    Comment 2 Document in Chart   Glucose, capillary     Status: Abnormal   Collection Time: 12/31/17  7:28 AM  Result Value Ref Range   Glucose-Capillary 171 (H) 70 - 99 mg/dL    ABGS No results for input(s): PHART, PO2ART, TCO2, HCO3 in the last 72 hours.  Invalid input(s): PCO2 CULTURES Recent Results (from the past 240 hour(s))  MRSA PCR Screening     Status: None   Collection Time: 12/27/17  4:45 PM  Result Value Ref Range Status   MRSA by PCR NEGATIVE NEGATIVE Final    Comment:        The GeneXpert MRSA Assay (FDA approved for NASAL specimens only), is one component of a comprehensive MRSA colonization surveillance program. It is not intended to diagnose MRSA infection nor to guide or monitor treatment for MRSA infections. Performed at [facility], [address] [place], [place] [address]    Studies/Results: No results found.  Medications:  Prior to Admission:  Medications Prior to Admission  Medication Sig Dispense Refill  Last Dose  . AMITIZA 24 MCG capsule Take 24 mcg by mouth 2 (two) times a week.    Past Week at Unknown time  . carvedilol (COREG) 12.5 MG tablet TAKE 1 TABLET(12.5 MG) BY MOUTH TWICE DAILY (Patient taking differently: Take 12.5 mg by mouth 2 (two) times daily with a meal. ) 180 tablet 3 12/27/2017 at 830  . dexamethasone (DECADRON) 4 MG tablet Take 1 tablet (4 mg total) by mouth 2 (two) times daily. 60 tablet 0 12/27/2017 at Unknown time  . ENTRESTO 49-51 MG TAKE 1 TABLET BY MOUTH TWICE DAILY (Patient taking differently: Take 1 tablet by mouth 2 (two) times daily. ) 180 tablet 3 12/27/2017 at 830  . furosemide (LASIX) 40 MG tablet Alternate 40 mg one day and 20 mg the next (Patient taking differently: Take 40 mg by  mouth daily. ) 90 tablet 3 12/27/2017 at Unknown time  . glipiZIDE (GLUCOTROL XL) 10 MG 24 hr tablet Take 1 tablet (10 mg total) by mouth daily with breakfast. 30 tablet 2 12/27/2017 at Unknown time  . Insulin Glargine (BASAGLAR KWIKPEN) 100 UNIT/ML SOPN 12 Units daily.   12/27/2017 at Unknown time  . lidocaine-prilocaine (EMLA) cream Apply small amount over port site and cover with plastic wrap one hour prior to appointment. 30 g 0 Taking  . linagliptin (TRADJENTA) 5 MG TABS tablet Take 5 mg by mouth daily.   12/27/2017 at Unknown time  . metFORMIN (GLUCOPHAGE) 500 MG tablet TAKE 1 TABLET(500 MG) BY MOUTH TWICE DAILY WITH A MEAL 60 tablet 2 Past Week at Unknown time  . milk thistle 175 MG tablet Take 175 mg by mouth daily.   Past Week at Unknown time  . OLANZapine (ZYPREXA) 5 MG tablet Take 5 mg by mouth 2 (two) times daily.   12/27/2017 at Unknown time  . ondansetron (ZOFRAN ODT) 8 MG disintegrating tablet Take 1 tablet (8 mg total) by mouth every 8 (eight) hours as needed for nausea or vomiting. 40 tablet 2 Past Week at Unknown time  . OXALIPLATIN IV Inject into the vein.   Taking  . pantoprazole (PROTONIX) 40 MG tablet Take 1 tablet (40 mg total) by mouth 2 (two) times daily before a meal. 60 tablet 3 12/27/2017 at Unknown time  . potassium chloride SA (K-DUR,KLOR-CON) 20 MEQ tablet Take 20 mEq by mouth daily.   12/27/2017 at Unknown time  . prochlorperazine (COMPAZINE) 10 MG tablet Take 1 tablet (10 mg total) by mouth every 6 (six) hours as needed for nausea or vomiting. 30 tablet 3 Taking  . rosuvastatin (CRESTOR) 10 MG tablet Take 1 tablet (10 mg total) by mouth daily. 90 tablet 3 12/27/2017 at Unknown time  . vitamin C (ASCORBIC ACID) 250 MG tablet Take by mouth.   12/27/2017 at Unknown time  . acetaminophen (TYLENOL) 325 MG tablet Take 650 mg by mouth every 6 (six) hours as needed for moderate pain.    Taking  . B-D UF III MINI PEN NEEDLES 31G X 5 MM MISC    Taking  . Blood Glucose  Monitoring Suppl (ONETOUCH VERIO) w/Device KIT 1 each by Does not apply route as needed. 1 kit 0 Taking  . fluorouracil CALGB 11941 in sodium chloride 0.9 % 150 mL Inject into the vein continuous. Over 46 hours   Taking  . glucose blood test strip 1 each by Other route 4 (four) times daily. Use as instructed qid One Touch ultra 150 each 5 Taking   Scheduled: . carvedilol  12.5 mg Oral BID WC  . dexamethasone  2 mg Oral BID   Followed by  . [START ON 01/05/2018] dexamethasone  2 mg Oral Daily   Followed by  . [START ON 01/10/2018] dexamethasone  2 mg Oral Q48H  . feeding supplement (GLUCERNA SHAKE)  237 mL Oral BID BM  . furosemide  40 mg Oral Daily  . gabapentin  100 mg Oral BID  . insulin aspart  0-20 Units Subcutaneous TID WC  . insulin aspart  0-5 Units Subcutaneous QHS  . insulin aspart  4 Units Subcutaneous TID WC  . insulin glargine  35 Units Subcutaneous QHS  . lubiprostone  24 mcg Oral Once per day on Mon Thu  . OLANZapine  5 mg Oral BID  . pantoprazole  40 mg Oral BID AC  . rosuvastatin  10 mg Oral Daily  . sucralfate  1 g Oral TID WC & HS   Continuous: . sodium chloride 50 mL/hr at 12/31/17 0700  . sodium chloride     VPX:TGGYIRSWNIOEV, alum & mag hydroxide-simeth **AND** [COMPLETED] lidocaine, fentaNYL (SUBLIMAZE) injection, lidocaine, ondansetron, prochlorperazine, senna  Assesment: He was admitted with uncontrolled diabetes.  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  He also has mets to the liver.  His sugar is better but he still getting over 350 so I do not think he is quite ready to go to the skilled care facility.  At baseline he has esophageal cancer which is metastatic and he does have DNR status  He has chronic diastolic heart failure which seems to be pretty stable  He has coronary disease which is stable  He has schizoaffective disorder bipolar type which complicates his treatment.  He  still does not have good understanding of what is going on Active Problems:   DM type 2 causing vascular disease ([place])   Essential hypertension, benign   Acute on chronic diastolic heart failure (HCC)   Schizoaffective disorder, bipolar type ([place])   CAD (coronary artery disease)   Acute renal failure superimposed on stage 3 chronic kidney disease ([place])   Dysphagia   Malignant neoplasm of cardio-esophageal junction (HCC)   Malignant neoplasm of esophagus ([place])   Brain metastasis ([place])   Hyperglycemia without ketosis   DNR (do not resuscitate)   Odynophagia    Plan: Transfer out of stepdown to regular floor probable discharge to skilled care facility tomorrow    LOS: 4 days   Naethan Bracewell L 12/31/2017, 8:44 AM

## 2017-12-31 NOTE — Progress Notes (Signed)
  Radiation Oncology         [phone] ________________________________  Name: TRICE ASPINALL MRN: [id number]  Date: 12/24/2017  DOB: 1950-07-21  DIAGNOSIS:     ICD-10-CM   1. Brain metastasis ([place]) C79.31     NARRATIVE:  The patient was brought to the [facility].  Identity was confirmed.  All relevant records and images related to the planned course of therapy were reviewed.  The patient freely provided informed written consent to proceed with treatment after reviewing the details related to the planned course of therapy. The consent form was witnessed and verified by the simulation staff. Intravenous access was established for contrast administration. Then, the patient was set-up in a stable reproducible supine position for radiation therapy.  A relocatable thermoplastic stereotactic head frame was fabricated for precise immobilization.  CT images were obtained.  Surface markings were placed.  The CT images were loaded into the planning software and fused with the patient's targeting MRI scan.  Then the target and avoidance structures were contoured.  Treatment planning then occurred.  The radiation prescription was entered and confirmed.  I have requested 3D planning  I have requested a DVH of the following structures: Brain stem, brain, left eye, right eye, lenses, optic chiasm, target volumes, uninvolved brain, and normal tissue.    SPECIAL TREATMENT PROCEDURE:  The planned course of therapy using radiation constitutes a special treatment procedure. Special care is required in the management of this patient for the following reasons. This treatment constitutes a Special Treatment Procedure for the following reason: High dose per fraction requiring special monitoring for increased toxicities of treatment including daily imaging.  The special nature of the planned course of radiotherapy will require increased physician supervision and oversight to ensure patient's safety with  optimal treatment outcomes.  PLAN:  The patient will receive 20 Gy in 1 fraction.   ------------------------------------------------  Jodelle Gross, MD, PhD

## 2017-12-31 NOTE — Progress Notes (Signed)
Physical Therapy Treatment Patient Details Name: [name] MRN: [id number] DOB: 05/31/1950 Today's Date: 12/31/2017    History of Present Illness [name] is a 67 year old male with past medical history of arthritis bipolar disorder coronary artery disease, bipolar disorder, schizoaffective disorder, diabetes mellitus chronic combined systolic and diastolic failure and recent esophageal cancer with metastasis to the liver and brain, currently still getting chemotherapy, who presented to the emergency room with high blood sugars increasing weakness and fatigue.  Patient has been getting daily p.o. steroids for his brain metastasis and his blood sugar had been elevated for several months he was put on medication for it.  Today he got a routine blood work at his primary care provider follow-up and was found to have blood sugar in the 600s and told to come to the emergency department for evaluation.  He endorses frequent urination and thirst.  Patient lives at home at alone and he probably not following proper diet for his sugars he has an outpatient social worker who is working on placement    PT Comments    Patient is making good progress with PT.  Patient requires cues for hand placement and sequencing of steps using the RW but was more steady ambulating with RW today and no right drift noted. 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 Per patient report, this is from a long standing injury. Patient's overall mobility is improving.  Patient would continue to benefit from skilled physical therapy in current environment and next venue to continue return to prior function and increase strength, endurance, balance, coordination, and functional mobility and gait skills.      Follow Up Recommendations  SNF     Equipment Recommendations  None recommended by PT    Recommendations for Other Services       Precautions / Restrictions Precautions Precautions: Fall Restrictions Weight Bearing  Restrictions: No    Mobility  Bed Mobility Overal bed mobility: Needs Assistance Bed Mobility: Supine to Sit     Supine to sit: Supervision     General bed mobility comments: somewhat slow labored movement  Transfers Overall transfer level: Needs assistance Equipment used: Rolling walker (2 wheeled);None Transfers: Sit to/from [employer] to Stand: Min guard Stand pivot transfers: Min guard       General transfer comment: unsteady on feet without AD, requires RW for safety, cues for placement of hands during transfers  Ambulation/Gait Ambulation/Gait assistance: Min guard Gait Distance (Feet): 200 Feet Assistive device: Rolling walker (2 wheeled) Gait Pattern/deviations: Decreased step length - right;Decreased step length - left;Decreased stride length Gait velocity: decreased   General Gait Details: unsteady gait without use of AD, drop foot on Lt noted (per patient from an old injury)   Stairs             Wheelchair Mobility    Modified Rankin (Stroke Patients Only)       Balance Overall balance assessment: Needs assistance Sitting-balance support: Feet supported;No upper extremity supported Sitting balance-Leahy Scale: Good     Standing balance support: During functional activity;Bilateral upper extremity supported Standing balance-Leahy Scale: Fair Standing balance comment: fair/poor without AD use                            Cognition Arousal/Alertness: Awake/alert Behavior During Therapy: WFL for tasks assessed/performed Overall Cognitive Status: Within Functional Limits for tasks assessed  Exercises General Exercises - Upper Extremity Shoulder Flexion: AROM;Strengthening;Both;10 reps;Seated General Exercises - Lower Extremity Gluteal Sets: Strengthening;Both;10 reps;Seated Long Arc Quad: AROM;Strengthening;Both;10 reps;Seated Hip Flexion/Marching:  AROM;Strengthening;Both;10 reps;Seated Toe Raises: Strengthening;Both;10 reps;Seated(unable to attain active motion on left) Heel Raises: AROM;Strengthening;Left;10 reps;Seated    General Comments        Pertinent Vitals/Pain Pain Assessment: No/denies pain    Home Living                      Prior Function            PT Goals (current goals can now be found in the care plan section) Acute Rehab PT Goals Patient Stated Goal: return home after rehab PT Goal Formulation: With patient Time For Goal Achievement: 01/12/18 Potential to Achieve Goals: Good Progress towards PT goals: Progressing toward goals    Frequency    Min 3X/week      PT Plan Current plan remains appropriate    Co-evaluation              AM-PAC PT "6 Clicks" Daily Activity  Outcome Measure  Difficulty turning over in bed (including adjusting bedclothes, sheets and blankets)?: A Little Difficulty moving from lying on back to sitting on the side of the bed? : A Lot Difficulty sitting down on and standing up from a chair with arms (e.g., wheelchair, bedside commode, etc,.)?: Unable Help needed moving to and from a bed to chair (including a wheelchair)?: A Little Help needed walking in hospital room?: A Little Help needed climbing 3-5 steps with a railing? : A Lot 6 Click Score: 14    End of Session Equipment Utilized During Treatment: Gait belt Activity Tolerance: Patient tolerated treatment well;Patient limited by fatigue Patient left: in chair;with call bell/phone within reach;with chair alarm set Nurse Communication: Mobility status PT Visit Diagnosis: Unsteadiness on feet (R26.81);Other abnormalities of gait and mobility (R26.89);Muscle weakness (generalized) (M62.81)     Time: 6438-3818 PT Time Calculation (min) (ACUTE ONLY): 30 min  Charges:  $Gait Training: 8-22 mins $Therapeutic Exercise: 8-22 mins                     Floria Raveling. Hartnett-Rands, MS, PT Per [facility] #[id number] 12/31/2017, 1:52 PM

## 2017-12-31 NOTE — Care Management Important Message (Signed)
Important Message  Patient Details  Name: [name] MRN: [id number] Date of Birth: [birth year]   Medicare Important Message Given:  Yes    [name] 12/31/2017, 10:59 AM

## 2017-12-31 NOTE — Progress Notes (Signed)
  Radiation Oncology         [phone] ________________________________  Name: NED KAKAR MRN: [id number]  Date: 12/30/2017  DOB: 05-21-1950   SPECIAL TREATMENT PROCEDURE   3D TREATMENT PLANNING AND DOSIMETRY: The patient's radiation plan was reviewed and approved by Dr. Sherwood Gambler from neurosurgery and radiation oncology prior to treatment. It showed 3-dimensional radiation distributions overlaid onto the planning CT/MRI image set. The [facility] for the target structures as well as the organs at risk were reviewed. The documentation of the 3D plan and dosimetry are filed in the radiation oncology EMR.   NARRATIVE: The patient was brought to the TrueBeam stereotactic radiation treatment machine and placed supine on the CT couch. The head frame was applied, and the patient was set up for stereotactic radiosurgery. Neurosurgery was present for the set-up and delivery   SIMULATION VERIFICATION: In the couch zero-angle position, the patient underwent Exactrac imaging using the Brainlab system with orthogonal KV images. These were carefully aligned and repeated to confirm treatment position for each of the isocenters. The Exactrac snap film verification was repeated at each couch angle.   SPECIAL TREATMENT PROCEDURE: The patient received stereotactic radiosurgery to the following target:  PTV1 target was treated using 5 Arcs to a prescription dose of 20 Gy. ExacTrac Snap verification was performed for each couch angle.   STEREOTACTIC TREATMENT MANAGEMENT: Following delivery, the patient was transported to nursing in stable condition and monitored for possible acute effects. Vital signs were recorded . The patient tolerated treatment without significant acute effects, and was discharged to home in stable condition.  PLAN: Follow-up in one month.   ------------------------------------------------  Jodelle Gross, MD, PhD

## 2017-12-31 NOTE — Clinical Social Work Placement (Signed)
   CLINICAL SOCIAL WORK PLACEMENT  NOTE  Date:  12/31/2017  Patient Details  Name: [name] MRN: [id number] Date of Birth: 1950-04-07  Clinical Social Work is seeking post-discharge placement for this patient at the [facility] level of care (*CSW will initial, date and re-position this form in  chart as items are completed):  Yes   Patient/family provided with [facility] Work Department's list of facilities offering this level of care within the geographic area requested by the patient (or if unable, by the patient's family).  Yes   Patient/family informed of their freedom to choose among providers that offer the needed level of care, that participate in Medicare, Medicaid or managed care program needed by the patient, have an available bed and are willing to accept the patient.  Yes   Patient/family informed of [facility]'s ownership interest in [facility] and [facility], as well as of the fact that they are under no obligation to receive care at these facilities.  PASRR submitted to EDS on 12/28/17     PASRR number received on 12/29/17     Existing PASRR number confirmed on       FL2 transmitted to all facilities in geographic area requested by pt/family on 12/29/17     FL2 transmitted to all facilities within larger geographic area on       Patient informed that his/her managed care company has contracts with or will negotiate with certain facilities, including the following:        Yes   Patient/family informed of bed offers received.  Patient chooses bed at [facility]     Physician recommends and patient chooses bed at      Patient to be transferred to [facility] on 01/01/18.  Patient to be transferred to facility by [facility]     Patient family notified on 12/31/17 of transfer.  Name of family member notified:  [name]     PHYSICIAN       Additional Comment: [name] to transport day  of d/c.   _______________________________________________ Trish Mage, LCSW 12/31/2017, 3:40 PM

## 2018-01-01 DIAGNOSIS — C160 Malignant neoplasm of cardia: Secondary | ICD-10-CM | POA: Diagnosis not present

## 2018-01-01 DIAGNOSIS — Z23 Encounter for immunization: Secondary | ICD-10-CM | POA: Diagnosis not present

## 2018-01-01 DIAGNOSIS — C787 Secondary malignant neoplasm of liver and intrahepatic bile duct: Secondary | ICD-10-CM | POA: Diagnosis not present

## 2018-01-01 DIAGNOSIS — I10 Essential (primary) hypertension: Secondary | ICD-10-CM | POA: Diagnosis not present

## 2018-01-01 DIAGNOSIS — I5033 Acute on chronic diastolic (congestive) heart failure: Secondary | ICD-10-CM | POA: Diagnosis not present

## 2018-01-01 DIAGNOSIS — F250 Schizoaffective disorder, bipolar type: Secondary | ICD-10-CM | POA: Diagnosis not present

## 2018-01-01 DIAGNOSIS — M6281 Muscle weakness (generalized): Secondary | ICD-10-CM | POA: Diagnosis not present

## 2018-01-01 DIAGNOSIS — R130 Aphagia: Secondary | ICD-10-CM | POA: Diagnosis not present

## 2018-01-01 DIAGNOSIS — E1151 Type 2 diabetes mellitus with diabetic peripheral angiopathy without gangrene: Secondary | ICD-10-CM | POA: Diagnosis not present

## 2018-01-01 DIAGNOSIS — C7931 Secondary malignant neoplasm of brain: Secondary | ICD-10-CM | POA: Diagnosis not present

## 2018-01-01 DIAGNOSIS — R1310 Dysphagia, unspecified: Secondary | ICD-10-CM | POA: Diagnosis not present

## 2018-01-01 DIAGNOSIS — I5022 Chronic systolic (congestive) heart failure: Secondary | ICD-10-CM | POA: Diagnosis not present

## 2018-01-01 DIAGNOSIS — R1312 Dysphagia, oropharyngeal phase: Secondary | ICD-10-CM | POA: Diagnosis not present

## 2018-01-01 DIAGNOSIS — I2510 Atherosclerotic heart disease of native coronary artery without angina pectoris: Secondary | ICD-10-CM | POA: Diagnosis not present

## 2018-01-01 DIAGNOSIS — C159 Malignant neoplasm of esophagus, unspecified: Secondary | ICD-10-CM | POA: Diagnosis not present

## 2018-01-01 DIAGNOSIS — R262 Difficulty in walking, not elsewhere classified: Secondary | ICD-10-CM | POA: Diagnosis not present

## 2018-01-01 DIAGNOSIS — R41841 Cognitive communication deficit: Secondary | ICD-10-CM | POA: Diagnosis not present

## 2018-01-01 DIAGNOSIS — N183 Chronic kidney disease, stage 3 (moderate): Secondary | ICD-10-CM | POA: Diagnosis not present

## 2018-01-01 DIAGNOSIS — E1340 Other specified diabetes mellitus with diabetic neuropathy, unspecified: Secondary | ICD-10-CM | POA: Diagnosis not present

## 2018-01-01 LAB — GLUCOSE, CAPILLARY
Glucose-Capillary: 297 mg/dL — ABNORMAL HIGH (ref 70–99)
Glucose-Capillary: 257 mg/dL — ABNORMAL HIGH (ref 70–99)

## 2018-01-01 LAB — BASIC METABOLIC PANEL
Anion gap: 10 (ref 5–15)
BUN: 20 mg/dL (ref 8–23)
CO2: 32 mmol/L (ref 22–32)
CREATININE: 0.94 mg/dL (ref 0.61–1.24)
Calcium: 7.8 mg/dL — ABNORMAL LOW (ref 8.9–10.3)
Chloride: 96 mmol/L — ABNORMAL LOW (ref 98–111)
GFR calc Af Amer: >60 mL/min (ref >60)
GLUCOSE: 281 mg/dL — AB (ref 70–99)
POTASSIUM: 3.6 mmol/L (ref 3.5–5.1)
Sodium: 138 mmol/L (ref 135–145)

## 2018-01-01 MED ORDER — GABAPENTIN 100 MG PO CAPS: 100.0000 mg | ORAL_CAPSULE | Freq: Two times a day (BID) | ORAL | Status: AC

## 2018-01-01 MED ORDER — INSULIN ASPART 100 UNIT/ML ~~LOC~~ SOLN: 0.0000 [IU] | Freq: Three times a day (TID) | SUBCUTANEOUS | 11 refills | Status: AC

## 2018-01-01 MED ORDER — INSULIN GLARGINE 100 UNIT/ML ~~LOC~~ SOLN: 35.0000 [IU] | Freq: Every day | SUBCUTANEOUS | 11 refills | Status: AC

## 2018-01-01 MED ORDER — DEXAMETHASONE 2 MG PO TABS: 2.0000 mg | ORAL_TABLET | Freq: Two times a day (BID) | ORAL | Status: AC

## 2018-01-01 MED ORDER — GLUCERNA SHAKE PO LIQD: 237.0000 mL | Freq: Two times a day (BID) | ORAL | 0 refills | Status: AC

## 2018-01-01 MED ORDER — INSULIN ASPART 100 UNIT/ML ~~LOC~~ SOLN: 0.0000 [IU] | Freq: Every day | SUBCUTANEOUS | 11 refills | Status: AC

## 2018-01-01 MED ORDER — ALUM & MAG HYDROXIDE-SIMETH 200-200-20 MG/5ML PO SUSP: 30.0000 mL | ORAL | 0 refills | Status: AC | PRN

## 2018-01-01 MED ORDER — INSULIN ASPART 100 UNIT/ML ~~LOC~~ SOLN: 6.0000 [IU] | Freq: Three times a day (TID) | SUBCUTANEOUS | 11 refills | Status: AC

## 2018-01-01 NOTE — Plan of Care (Signed)

## 2018-01-01 NOTE — Progress Notes (Signed)
Report called to [facility] at this time.  Patient discharged with family.

## 2018-01-01 NOTE — Discharge Summary (Signed)
Physician Discharge Summary  Patient ID: [name] MRN: [id number] DOB/AGE: 1950/11/12 67 y.o. Primary Care Physician:Yousaf Sainato, Percell Miller, MD Admit date: 12/27/2017 Discharge date: 01/01/2018    Discharge Diagnoses:   Active Problems:   DM type 2 causing vascular disease ([place])   Essential hypertension, benign   Acute on chronic diastolic heart failure (HCC)   Schizoaffective disorder, bipolar type (HCC)   CAD (coronary artery disease)   Acute renal failure superimposed on stage 3 chronic kidney disease ([place])   Dysphagia   Malignant neoplasm of cardio-esophageal junction (HCC)   Malignant neoplasm of esophagus (HCC)   Brain metastasis ([place])   Hyperglycemia without ketosis   DNR (do not resuscitate)   Odynophagia   Allergies as of 01/01/2018      Reactions   Codeine Nausea And Vomiting   Morphine And Related Nausea And Vomiting      Medication List    STOP taking these medications   BASAGLAR KWIKPEN 100 UNIT/ML Sopn Replaced by:  insulin glargine 100 UNIT/ML injection   fluorouracil CALGB 81448 in sodium chloride 0.9 % 150 mL   glipiZIDE 10 MG 24 hr tablet Commonly known as:  GLUCOTROL XL   lidocaine-prilocaine cream Commonly known as:  EMLA   linagliptin 5 MG Tabs tablet Commonly known as:  TRADJENTA   metFORMIN 500 MG tablet Commonly known as:  GLUCOPHAGE   OXALIPLATIN IV     TAKE these medications   acetaminophen 325 MG tablet Commonly known as:  TYLENOL Take 650 mg by mouth every 6 (six) hours as needed for moderate pain.   alum & mag hydroxide-simeth 200-200-20 MG/5ML suspension Commonly known as:  MAALOX/MYLANTA Take 30 mLs by mouth every 4 (four) hours as needed for indigestion.   AMITIZA 24 MCG capsule Generic drug:  lubiprostone Take 24 mcg by mouth 2 (two) times a week.   B-D UF III MINI PEN NEEDLES 31G X 5 MM Misc Generic drug:  Insulin Pen Needle   carvedilol 12.5 MG tablet Commonly known as:  COREG TAKE 1 TABLET(12.5 MG) BY MOUTH  TWICE DAILY What changed:  See the new instructions.   dexamethasone 2 MG tablet Commonly known as:  DECADRON Take 1 tablet (2 mg total) by mouth 2 (two) times daily. What changed:    medication strength  how much to take   ENTRESTO 49-51 MG Generic drug:  sacubitril-valsartan TAKE 1 TABLET BY MOUTH TWICE DAILY   feeding supplement (GLUCERNA SHAKE) Liqd Take 237 mLs by mouth 2 (two) times daily between meals.   furosemide 40 MG tablet Commonly known as:  LASIX Alternate 40 mg one day and 20 mg the next What changed:    how much to take  how to take this  when to take this  additional instructions   gabapentin 100 MG capsule Commonly known as:  NEURONTIN Take 1 capsule (100 mg total) by mouth 2 (two) times daily.   glucose blood test strip 1 each by Other route 4 (four) times daily. Use as instructed qid One Touch ultra   insulin aspart 100 UNIT/ML injection Commonly known as:  novoLOG Inject 0-20 Units into the skin 3 (three) times daily with meals.   insulin aspart 100 UNIT/ML injection Commonly known as:  novoLOG Inject 6 Units into the skin 3 (three) times daily with meals.   insulin aspart 100 UNIT/ML injection Commonly known as:  novoLOG Inject 0-5 Units into the skin at bedtime.   insulin glargine 100 UNIT/ML injection Commonly known as:  LANTUS  Inject 0.35 mLs (35 Units total) into the skin at bedtime. Replaces:  BASAGLAR KWIKPEN 100 UNIT/ML Sopn   milk thistle 175 MG tablet Take 175 mg by mouth daily.   OLANZapine 5 MG tablet Commonly known as:  ZYPREXA Take 5 mg by mouth 2 (two) times daily.   ondansetron 8 MG disintegrating tablet Commonly known as:  ZOFRAN-ODT Take 1 tablet (8 mg total) by mouth every 8 (eight) hours as needed for nausea or vomiting.   ONETOUCH VERIO w/Device Kit 1 each by Does not apply route as needed.   pantoprazole 40 MG tablet Commonly known as:  PROTONIX Take 1 tablet (40 mg total) by mouth 2 (two) times daily  before a meal.   potassium chloride SA 20 MEQ tablet Commonly known as:  K-DUR,KLOR-CON Take 20 mEq by mouth daily.   prochlorperazine 10 MG tablet Commonly known as:  COMPAZINE Take 1 tablet (10 mg total) by mouth every 6 (six) hours as needed for nausea or vomiting.   rosuvastatin 10 MG tablet Commonly known as:  CRESTOR Take 1 tablet (10 mg total) by mouth daily.   vitamin C 250 MG tablet Commonly known as:  ASCORBIC ACID Take by mouth.       Discharged Condition: Improved    Consults: Neurosurgery/radiation oncology  Significant Diagnostic Studies: Dg Chest 2 View  Result Date: 12/18/2017 CLINICAL DATA:  Dehydration and weakness.  Stage IV liver cancer. EXAM: CHEST - 2 VIEW COMPARISON:  10/25/2017. FINDINGS: Cardiac enlargement. Left chest wall port a catheter noted with tip at the cavoatrial junction. No pleural effusion. The lung volumes are low. No airspace opacities identified. IMPRESSION: 1. Low lung volumes. 2. Cardiac enlargement. Electronically Signed   By: Kerby Moors M.D.   On: 12/18/2017 22:00   Ct Head W & Wo Contrast  Result Date: 12/03/2017 CLINICAL DATA:  67 year old male with esophageal cancer. EXAM: CT HEAD WITHOUT AND WITH CONTRAST TECHNIQUE: Contiguous axial images were obtained from the base of the skull through the vertex without and with intravenous contrast CONTRAST:  23m OMNIPAQUE IOHEXOL 300 MG/ML  SOLN COMPARISON:  PET-CT 03/24/2017.  Noncontrast head CT 03/05/2016. FINDINGS: Brain: Heterogeneously enhancing mass in the right lower cerebellum estimated at 24-26 millimeters diameter (series 3, image 6) is associated with mild regional cerebellar edema (series 2, image 6). There is no significant mass effect. No other intracranial mass or abnormal enhancement. Patchy bilateral cerebral white matter hypodensity appears progressed since 2018. A possible small area of left inferior frontal gyrus encephalomalacia on series 2, image 13 is stable. No  midline shift or ventriculomegaly. No intracranial hemorrhage or evidence of cortically based acute infarction. Vascular: Calcified atherosclerosis at the skull base. The major intracranial vascular structures are enhancing and appear patent. Skull: Stable and negative. Sinuses/Orbits: Bubbly opacity in the right sphenoid sinus but otherwise stable and well pneumatized. Tympanic cavities and mastoids remain clear. Other: Visualized orbit soft tissues are within normal limits. Visualized scalp soft tissues are within normal limits. IMPRESSION: 1. Positive for a solitary enhancing mass in the right cerebellum compatible with Metastasis. Size estimated at 24-26 mm. Surrounding edema but no significant mass effect. 2. No other metastatic disease identified, but head MRI (without and with contrast) would be most sensitive and is recommended. These results will be called to the ordering clinician or representative by the Radiologist Assistant, and communication documented in the PACS or zVision Dashboard. Electronically Signed   By: HGenevie AnnM.D.   On: 12/03/2017 09:16   Mr  Brain W Wo Contrast  Result Date: 12/19/2017 CLINICAL DATA:  Esophageal adenocarcinoma. Right cerebellar mass suspected. Planning for [facility] treatment. EXAM: MRI HEAD WITHOUT AND WITH CONTRAST TECHNIQUE: Multiplanar, multiecho pulse sequences of the brain and surrounding structures were obtained without and with intravenous contrast. CONTRAST:  69m MULTIHANCE GADOBENATE DIMEGLUMINE 529 MG/ML IV SOLN COMPARISON:  CT head without and with contrast 12/03/2017 FINDINGS: Brain: Enhancing mass lesion in the inferolateral right cerebellum measures 1.6 x 1.3 x 1.9 cm. This corresponds to the lesion identified on CT. No additional enhancing lesions are present. Moderate generalized atrophy and diffuse white matter disease is present. Ventricles are of normal size. No significant extra-axial fluid collection is present. Vasogenic edema surrounds the right  cerebellar lesion. Vascular: Flow is present in the major intracranial arteries. Skull and upper cervical spine: Exaggerated cervical lordosis is noted. Craniocervical junction is normal. Marrow signal is normal. Sinuses/Orbits: The paranasal sinuses and mastoid air cells are clear. Globes and orbits are within normal limits. IMPRESSION: 1. Solitary enhancing mass lesion in the right cerebellum measures 1.6 x 1.3 x 1.9 cm. This is highly concerning for a focal metastasis. 2. Atrophy and diffuse white matter disease is markedly advanced for age. This likely reflects the sequela of chronic microvascular ischemia Electronically Signed   By: CSan MorelleM.D.   On: 12/19/2017 15:24    Lab Results: Basic Metabolic Panel: Recent Labs    01/01/18 0608  NA 138  K 3.6  CL 96*  CO2 32  GLUCOSE 281*  BUN 20  CREATININE 0.94  CALCIUM 7.8*   Liver Function Tests: No results for input(s): AST, ALT, ALKPHOS, BILITOT, PROT, ALBUMIN in the last 72 hours.   CBC: No results for input(s): WBC, NEUTROABS, HGB, HCT, MCV, PLT in the last 72 hours.  Recent Results (from the past 240 hour(s))  MRSA PCR Screening     Status: None   Collection Time: 12/27/17  4:45 PM  Result Value Ref Range Status   MRSA by PCR NEGATIVE NEGATIVE Final    Comment:        The GeneXpert MRSA Assay (FDA approved for NASAL specimens only), is one component of a comprehensive MRSA colonization surveillance program. It is not intended to diagnose MRSA infection nor to guide or monitor treatment for MRSA infections. 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  He was started on Decadron pending starting radiation treatments.  However he has diabetes and his blood sugar has become increasingly uncontrolled.  He came to the emergency  department and his blood sugar was over 500.  He was admitted started on insulin and improved.  Consultation was obtained with radiation oncology and he received treatment on the 14th.  Neurosurgery also saw him and gave uKoreainstructions on how to taper his Decadron.  His blood sugar is still running in the 2 and 300s with him being on sliding scale and on mealtime insulin and on Lantus but is felt that as he comes down on his Decadron this should be markedly improved.  He was clear that he was not going to be able to manage all of this at home and he is going to go to a skilled care facility.  He has bipolar disease which complicates his treatment and he frequently has poor understanding  and simply forgets what he is been told.  Discharge Exam: Blood pressure 118/82, pulse 79, temperature (!) 97.5 F (36.4 C), temperature source Oral, resp. rate 18, height 5' 4"  (1.626 m), weight 79.6 kg, SpO2 98 %. He is awake and alert.  Chest is clear.  Heart is regular.  Disposition: To skilled care facility.  He can have speech, PT and OT as needed.  He will be on facility sliding scale resistant NovoLog with Accu-Cheks before meals and at bedtime.  He will be on 35 units of Lantus at bedtime.  He is receiving 6 units of NovoLog with meals as long as he eats 50% of the meal.  All of this will need to be adjusted likely when he comes off of Decadron.  As far as Decadron he will be on 2 mg twice daily for 4 days then 2 mg daily for 5 days then 2 mg every other day x5 doses and then stop.  Follow-up with Dr. Lisbeth Renshaw of radiation oncology in 1 month.    Contact information for after-discharge care    [facility] .   Service:  Skilled Nursing Contact information: [address] [place] [phone]              Signed: Alonza Bogus   01/01/2018, 10:52 AM

## 2018-01-01 NOTE — Progress Notes (Signed)
Subjective: He was admitted with elevated blood sugar.  His blood sugar is better but not totally controlled still.  He is going to go to a skilled care facility so I think that can be managed at the skilled care facility particularly as he comes off of Decadron  Objective: Vital signs in last 24 hours: Temp:  [97.5 F (36.4 C)-97.8 F (36.6 C)] 97.5 F (36.4 C) (11/16 0548) Pulse Rate:  [72-82] 79 (11/16 0548) Resp:  [10-18] 18 (11/16 0548) BP: (69-128)/(40-82) 118/82 (11/16 0548) SpO2:  [93 %-99 %] 98 % (11/16 0548) Weight change:  Last BM Date: 12/30/17  Intake/Output from previous day: 11/15 0701 - 11/16 0700 In: 480 [P.O.:480] Out: 3175 [Urine:3175]  PHYSICAL EXAM General appearance: alert, cooperative and no distress Resp: clear to auscultation bilaterally Cardio: regular rate and rhythm, S1, S2 normal, no murmur, click, rub or gallop GI: soft, non-tender; bowel sounds normal; no masses,  no organomegaly Extremities: extremities normal, atraumatic, no cyanosis or edema  Lab Results:  Results for orders placed or performed during the hospital encounter of 12/27/17 (from the past 48 hour(s))  Glucose, capillary     Status: Abnormal   Collection Time: 12/30/17  3:11 PM  Result Value Ref Range   Glucose-Capillary 288 (H) 70 - 99 mg/dL  Glucose, capillary     Status: Abnormal   Collection Time: 12/30/17  4:28 PM  Result Value Ref Range   Glucose-Capillary 329 (H) 70 - 99 mg/dL  Glucose, capillary     Status: Abnormal   Collection Time: 12/30/17  9:10 PM  Result Value Ref Range   Glucose-Capillary 254 (H) 70 - 99 mg/dL   Comment 1 Notify RN    Comment 2 Document in Chart   Glucose, capillary     Status: Abnormal   Collection Time: 12/31/17  7:28 AM  Result Value Ref Range   Glucose-Capillary 171 (H) 70 - 99 mg/dL  Glucose, capillary     Status: Abnormal   Collection Time: 12/31/17 11:13 AM  Result Value Ref Range   Glucose-Capillary 250 (H) 70 - 99 mg/dL  Glucose,  capillary     Status: Abnormal   Collection Time: 12/31/17  4:51 PM  Result Value Ref Range   Glucose-Capillary 279 (H) 70 - 99 mg/dL  Glucose, capillary     Status: Abnormal   Collection Time: 12/31/17  9:16 PM  Result Value Ref Range   Glucose-Capillary 316 (H) 70 - 99 mg/dL   Comment 1 Notify RN    Comment 2 Document in Chart   Basic metabolic panel     Status: Abnormal   Collection Time: 01/01/18  6:08 AM  Result Value Ref Range   Sodium 138 135 - 145 mmol/L   Potassium 3.6 3.5 - 5.1 mmol/L   Chloride 96 (L) 98 - 111 mmol/L   CO2 32 22 - 32 mmol/L   Glucose, Bld 281 (H) 70 - 99 mg/dL   BUN 20 8 - 23 mg/dL   Creatinine, Ser 0.94 0.61 - 1.24 mg/dL   Calcium 7.8 (L) 8.9 - 10.3 mg/dL   GFR calc non Af Amer >60 >60 mL/min   GFR calc Af Amer >60 >60 mL/min    Comment: (NOTE) The eGFR has been calculated using the CKD EPI equation. This calculation has not been validated in all clinical situations. eGFR's persistently <60 mL/min signify possible Chronic Kidney Disease.    Anion gap 10 5 - 15    Comment: Performed at [facility]  [facility], [address], [place], [place] [address]  Glucose, capillary     Status: Abnormal   Collection Time: 01/01/18  7:53 AM  Result Value Ref Range   Glucose-Capillary 297 (H) 70 - 99 mg/dL   Comment 1 Notify RN     ABGS No results for input(s): PHART, PO2ART, TCO2, HCO3 in the last 72 hours.  Invalid input(s): PCO2 CULTURES Recent Results (from the past 240 hour(s))  MRSA PCR Screening     Status: None   Collection Time: 12/27/17  4:45 PM  Result Value Ref Range Status   MRSA by PCR NEGATIVE NEGATIVE Final    Comment:        The GeneXpert MRSA Assay (FDA approved for NASAL specimens only), is one component of a comprehensive MRSA colonization surveillance program. It is not intended to diagnose MRSA infection nor to guide or monitor treatment for MRSA infections. Performed at [facility], [address], [place], [place] [address]     Studies/Results: No results found.  Medications:  Prior to Admission:  Medications Prior to Admission  Medication Sig Dispense Refill Last Dose  . AMITIZA 24 MCG capsule Take 24 mcg by mouth 2 (two) times a week.    Past Week at Unknown time  . carvedilol (COREG) 12.5 MG tablet TAKE 1 TABLET(12.5 MG) BY MOUTH TWICE DAILY (Patient taking differently: Take 12.5 mg by mouth 2 (two) times daily with a meal. ) 180 tablet 3 12/27/2017 at 830  . dexamethasone (DECADRON) 4 MG tablet Take 1 tablet (4 mg total) by mouth 2 (two) times daily. 60 tablet 0 12/27/2017 at Unknown time  . ENTRESTO 49-51 MG TAKE 1 TABLET BY MOUTH TWICE DAILY (Patient taking differently: Take 1 tablet by mouth 2 (two) times daily. ) 180 tablet 3 12/27/2017 at 830  . furosemide (LASIX) 40 MG tablet Alternate 40 mg one day and 20 mg the next (Patient taking differently: Take 40 mg by mouth daily. ) 90 tablet 3 12/27/2017 at Unknown time  . glipiZIDE (GLUCOTROL XL) 10 MG 24 hr tablet Take 1 tablet (10 mg total) by mouth daily with breakfast. 30 tablet 2 12/27/2017 at Unknown time  . Insulin Glargine (BASAGLAR KWIKPEN) 100 UNIT/ML SOPN 12 Units daily.   12/27/2017 at Unknown time  . lidocaine-prilocaine (EMLA) cream Apply small amount over port site and cover with plastic wrap one hour prior to appointment. 30 g 0 Taking  . linagliptin (TRADJENTA) 5 MG TABS tablet Take 5 mg by mouth daily.   12/27/2017 at Unknown time  . metFORMIN (GLUCOPHAGE) 500 MG tablet TAKE 1 TABLET(500 MG) BY MOUTH TWICE DAILY WITH A MEAL 60 tablet 2 Past Week at Unknown time  . milk thistle 175 MG tablet Take 175 mg by mouth daily.   Past Week at Unknown time  . OLANZapine (ZYPREXA) 5 MG tablet Take 5 mg by mouth 2 (two) times daily.   12/27/2017 at Unknown time  . ondansetron (ZOFRAN ODT) 8 MG disintegrating tablet Take 1 tablet (8 mg total) by mouth every 8 (eight) hours as needed for nausea or vomiting. 40 tablet 2 Past Week at Unknown time  . OXALIPLATIN  IV Inject into the vein.   Taking  . pantoprazole (PROTONIX) 40 MG tablet Take 1 tablet (40 mg total) by mouth 2 (two) times daily before a meal. 60 tablet 3 12/27/2017 at Unknown time  . potassium chloride SA (K-DUR,KLOR-CON) 20 MEQ tablet Take 20 mEq by mouth daily.   12/27/2017 at Unknown time  .  prochlorperazine (COMPAZINE) 10 MG tablet Take 1 tablet (10 mg total) by mouth every 6 (six) hours as needed for nausea or vomiting. 30 tablet 3 Taking  . rosuvastatin (CRESTOR) 10 MG tablet Take 1 tablet (10 mg total) by mouth daily. 90 tablet 3 12/27/2017 at Unknown time  . vitamin C (ASCORBIC ACID) 250 MG tablet Take by mouth.   12/27/2017 at Unknown time  . acetaminophen (TYLENOL) 325 MG tablet Take 650 mg by mouth every 6 (six) hours as needed for moderate pain.    Taking  . B-D UF III MINI PEN NEEDLES 31G X 5 MM MISC    Taking  . Blood Glucose Monitoring Suppl (ONETOUCH VERIO) w/Device KIT 1 each by Does not apply route as needed. 1 kit 0 Taking  . fluorouracil CALGB 25053 in sodium chloride 0.9 % 150 mL Inject into the vein continuous. Over 46 hours   Taking  . glucose blood test strip 1 each by Other route 4 (four) times daily. Use as instructed qid One Touch ultra 150 each 5 Taking   Scheduled: . carvedilol  12.5 mg Oral BID WC  . dexamethasone  2 mg Oral BID   Followed by  . [START ON 01/05/2018] dexamethasone  2 mg Oral Daily   Followed by  . [START ON 01/10/2018] dexamethasone  2 mg Oral Q48H  . feeding supplement (GLUCERNA SHAKE)  237 mL Oral BID BM  . furosemide  40 mg Oral Daily  . gabapentin  100 mg Oral BID  . insulin aspart  0-20 Units Subcutaneous TID WC  . insulin aspart  0-5 Units Subcutaneous QHS  . insulin aspart  4 Units Subcutaneous TID WC  . insulin glargine  35 Units Subcutaneous QHS  . lubiprostone  24 mcg Oral Once per day on Mon Thu  . OLANZapine  5 mg Oral BID  . pantoprazole  40 mg Oral BID AC  . rosuvastatin  10 mg Oral Daily   Continuous: . sodium chloride      ZJQ:[id number], alum & mag hydroxide-simeth **AND** [COMPLETED] lidocaine, fentaNYL (SUBLIMAZE) injection, lidocaine, ondansetron, prochlorperazine, senna  Assesment: He was admitted with uncontrolled diabetes.  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  He is tapering Decadron now.  His blood sugar is better but not ideal yet.  However he is going to go to a skilled care facility so I think we can have it monitored there safely.  He has esophageal cancer which is metastatic to brain and liver.  He had radiation to his brain on the 14th  He has coronary disease but no symptoms now  He has chronic diastolic heart failure that seems stable  He has schizoaffective disorder with bipolar disease and has very limited understanding of what is going on and frequently forgets what he is been told Active Problems:   DM type 2 causing vascular disease ([place])   Essential hypertension, benign   Acute on chronic diastolic heart failure (HCC)   Schizoaffective disorder, bipolar type (HCC)   CAD (coronary artery disease)   Acute renal failure superimposed on stage 3 chronic kidney disease ([place])   Dysphagia   Malignant neoplasm of cardio-esophageal junction ([place])   Malignant neoplasm of esophagus ([place])   Brain metastasis ([place])   Hyperglycemia without ketosis   DNR (do not resuscitate)   Odynophagia    Plan: Okay to transfer to skilled care facility today    LOS: 5  days   Adelfa Lozito L 01/01/2018, 10:44 AM

## 2018-01-01 NOTE — Plan of Care (Signed)
  Problem: Education: Goal: Knowledge of General Education information will improve Description Including pain rating scale, medication(s)/side effects and non-pharmacologic comfort measures 01/01/2018 1317 by Rance Muir, RN Outcome: Adequate for Discharge 01/01/2018 0937 by Rance Muir, RN Outcome: Progressing   Problem: Health Behavior/Discharge Planning: Goal: Ability to manage health-related needs will improve 01/01/2018 1317 by Rance Muir, RN Outcome: Adequate for Discharge 01/01/2018 0937 by Rance Muir, RN Outcome: Progressing   Problem: Clinical Measurements: Goal: Ability to maintain clinical measurements within normal limits will improve 01/01/2018 1317 by Rance Muir, RN Outcome: Adequate for Discharge 01/01/2018 0937 by Rance Muir, RN Outcome: Progressing Goal: Will remain free from infection 01/01/2018 1317 by Rance Muir, RN Outcome: Adequate for Discharge 01/01/2018 0937 by Rance Muir, RN Outcome: Progressing Goal: Diagnostic test results will improve 01/01/2018 1317 by Rance Muir, RN Outcome: Adequate for Discharge 01/01/2018 0937 by Rance Muir, RN Outcome: Progressing Goal: Respiratory complications will improve 01/01/2018 1317 by Rance Muir, RN Outcome: Adequate for Discharge 01/01/2018 0937 by Rance Muir, RN Outcome: Progressing Goal: Cardiovascular complication will be avoided 01/01/2018 1317 by Rance Muir, RN Outcome: Adequate for Discharge 01/01/2018 0937 by Rance Muir, RN Outcome: Progressing   Problem: Activity: Goal: Risk for activity intolerance will decrease 01/01/2018 1317 by Rance Muir, RN Outcome: Adequate for Discharge 01/01/2018 0937 by Rance Muir, RN Outcome: Progressing   Problem: Nutrition: Goal: Adequate nutrition will be maintained 01/01/2018 1317 by Rance Muir, RN Outcome: Adequate for Discharge 01/01/2018 0937 by Rance Muir, RN Outcome: Progressing   Problem: Coping: Goal: Level of anxiety will decrease 01/01/2018 1317 by Rance Muir,  RN Outcome: Adequate for Discharge 01/01/2018 0937 by Rance Muir, RN Outcome: Progressing   Problem: Elimination: Goal: Will not experience complications related to bowel motility 01/01/2018 1317 by Rance Muir, RN Outcome: Adequate for Discharge 01/01/2018 0937 by Rance Muir, RN Outcome: Progressing Goal: Will not experience complications related to urinary retention 01/01/2018 1317 by Rance Muir, RN Outcome: Adequate for Discharge 01/01/2018 0937 by Rance Muir, RN Outcome: Progressing   Problem: Pain Managment: Goal: General experience of comfort will improve 01/01/2018 1317 by Rance Muir, RN Outcome: Adequate for Discharge 01/01/2018 0937 by Rance Muir, RN Outcome: Progressing   Problem: Safety: Goal: Ability to remain free from injury will improve 01/01/2018 1317 by Rance Muir, RN Outcome: Adequate for Discharge 01/01/2018 0937 by Rance Muir, RN Outcome: Progressing   Problem: Skin Integrity: Goal: Risk for impaired skin integrity will decrease 01/01/2018 1317 by Rance Muir, RN Outcome: Adequate for Discharge 01/01/2018 [place] by Rance Muir, RN Outcome: Progressing

## 2018-01-01 NOTE — Clinical Social Work Note (Signed)
The CSW has contacted [facility] to confirm that the patient can discharge, today. The facility has confirmed that the patient can discharge to them, and a fax number was provided for discharge paperwork to be sent [phone]). The RN can call report to [phone]. The patient will transport with family and will not need an medical necessity form. The CSW is signing off. Please contact should needs arise.  Santiago Bumpers, MSW, Latanya Presser [phone]

## 2018-01-05 ENCOUNTER — Inpatient Hospital Stay (HOSPITAL_COMMUNITY): Payer: Medicare Other

## 2018-01-05 ENCOUNTER — Encounter (HOSPITAL_COMMUNITY): Payer: Self-pay | Admitting: Hematology

## 2018-01-05 ENCOUNTER — Inpatient Hospital Stay (HOSPITAL_BASED_OUTPATIENT_CLINIC_OR_DEPARTMENT_OTHER): Payer: Medicare Other | Admitting: Hematology

## 2018-01-05 ENCOUNTER — Other Ambulatory Visit (HOSPITAL_COMMUNITY): Payer: Self-pay

## 2018-01-05 ENCOUNTER — Ambulatory Visit (HOSPITAL_COMMUNITY): Payer: Self-pay | Admitting: Hematology

## 2018-01-05 ENCOUNTER — Ambulatory Visit (HOSPITAL_COMMUNITY): Payer: Self-pay

## 2018-01-05 DIAGNOSIS — M129 Arthropathy, unspecified: Secondary | ICD-10-CM | POA: Diagnosis not present

## 2018-01-05 DIAGNOSIS — E860 Dehydration: Secondary | ICD-10-CM | POA: Diagnosis not present

## 2018-01-05 DIAGNOSIS — R011 Cardiac murmur, unspecified: Secondary | ICD-10-CM

## 2018-01-05 DIAGNOSIS — N183 Chronic kidney disease, stage 3 (moderate): Secondary | ICD-10-CM | POA: Diagnosis not present

## 2018-01-05 DIAGNOSIS — F250 Schizoaffective disorder, bipolar type: Secondary | ICD-10-CM | POA: Diagnosis not present

## 2018-01-05 DIAGNOSIS — C787 Secondary malignant neoplasm of liver and intrahepatic bile duct: Secondary | ICD-10-CM | POA: Diagnosis not present

## 2018-01-05 DIAGNOSIS — I2510 Atherosclerotic heart disease of native coronary artery without angina pectoris: Secondary | ICD-10-CM | POA: Diagnosis not present

## 2018-01-05 DIAGNOSIS — R262 Difficulty in walking, not elsewhere classified: Secondary | ICD-10-CM | POA: Diagnosis not present

## 2018-01-05 DIAGNOSIS — I509 Heart failure, unspecified: Secondary | ICD-10-CM | POA: Diagnosis not present

## 2018-01-05 DIAGNOSIS — Z800 Family history of malignant neoplasm of digestive organs: Secondary | ICD-10-CM | POA: Diagnosis not present

## 2018-01-05 DIAGNOSIS — K219 Gastro-esophageal reflux disease without esophagitis: Secondary | ICD-10-CM | POA: Diagnosis not present

## 2018-01-05 DIAGNOSIS — C7931 Secondary malignant neoplasm of brain: Secondary | ICD-10-CM

## 2018-01-05 DIAGNOSIS — I5042 Chronic combined systolic (congestive) and diastolic (congestive) heart failure: Secondary | ICD-10-CM | POA: Diagnosis not present

## 2018-01-05 DIAGNOSIS — R531 Weakness: Secondary | ICD-10-CM | POA: Diagnosis not present

## 2018-01-05 DIAGNOSIS — R5383 Other fatigue: Secondary | ICD-10-CM | POA: Diagnosis not present

## 2018-01-05 DIAGNOSIS — I519 Heart disease, unspecified: Secondary | ICD-10-CM | POA: Diagnosis not present

## 2018-01-05 DIAGNOSIS — C159 Malignant neoplasm of esophagus, unspecified: Secondary | ICD-10-CM | POA: Diagnosis not present

## 2018-01-05 DIAGNOSIS — Z79899 Other long term (current) drug therapy: Secondary | ICD-10-CM | POA: Diagnosis not present

## 2018-01-05 DIAGNOSIS — E7800 Pure hypercholesterolemia, unspecified: Secondary | ICD-10-CM | POA: Diagnosis not present

## 2018-01-05 DIAGNOSIS — G4730 Sleep apnea, unspecified: Secondary | ICD-10-CM | POA: Diagnosis not present

## 2018-01-05 DIAGNOSIS — R1312 Dysphagia, oropharyngeal phase: Secondary | ICD-10-CM | POA: Diagnosis not present

## 2018-01-05 DIAGNOSIS — C160 Malignant neoplasm of cardia: Secondary | ICD-10-CM

## 2018-01-05 DIAGNOSIS — F319 Bipolar disorder, unspecified: Secondary | ICD-10-CM | POA: Diagnosis not present

## 2018-01-05 DIAGNOSIS — G4733 Obstructive sleep apnea (adult) (pediatric): Secondary | ICD-10-CM

## 2018-01-05 DIAGNOSIS — Z803 Family history of malignant neoplasm of breast: Secondary | ICD-10-CM

## 2018-01-05 DIAGNOSIS — I252 Old myocardial infarction: Secondary | ICD-10-CM

## 2018-01-05 DIAGNOSIS — I10 Essential (primary) hypertension: Secondary | ICD-10-CM | POA: Diagnosis not present

## 2018-01-05 DIAGNOSIS — R9082 White matter disease, unspecified: Secondary | ICD-10-CM | POA: Diagnosis not present

## 2018-01-05 DIAGNOSIS — I110 Hypertensive heart disease with heart failure: Secondary | ICD-10-CM | POA: Diagnosis not present

## 2018-01-05 DIAGNOSIS — C158 Malignant neoplasm of overlapping sites of esophagus: Secondary | ICD-10-CM

## 2018-01-05 DIAGNOSIS — R079 Chest pain, unspecified: Secondary | ICD-10-CM

## 2018-01-05 DIAGNOSIS — E119 Type 2 diabetes mellitus without complications: Secondary | ICD-10-CM

## 2018-01-05 DIAGNOSIS — Z9221 Personal history of antineoplastic chemotherapy: Secondary | ICD-10-CM | POA: Diagnosis not present

## 2018-01-05 DIAGNOSIS — M6281 Muscle weakness (generalized): Secondary | ICD-10-CM | POA: Diagnosis not present

## 2018-01-05 DIAGNOSIS — Z794 Long term (current) use of insulin: Secondary | ICD-10-CM | POA: Diagnosis not present

## 2018-01-05 DIAGNOSIS — R41841 Cognitive communication deficit: Secondary | ICD-10-CM | POA: Diagnosis not present

## 2018-01-05 DIAGNOSIS — Z87442 Personal history of urinary calculi: Secondary | ICD-10-CM

## 2018-01-05 LAB — CBC WITH DIFFERENTIAL/PLATELET
ABS IMMATURE GRANULOCYTES: 0.12 10*3/uL — AB (ref 0.00–0.07)
BASOS ABS: 0 10*3/uL (ref 0.0–0.1)
Basophils Relative: 0 %
Eosinophils Absolute: 0 10*3/uL (ref 0.0–0.5)
Eosinophils Relative: 0 %
HEMATOCRIT: 48.5 % (ref 39.0–52.0)
HEMOGLOBIN: 16 g/dL (ref 13.0–17.0)
IMMATURE GRANULOCYTES: 2 %
LYMPHS ABS: 0.6 10*3/uL — AB (ref 0.7–4.0)
LYMPHS PCT: 8 %
MCH: 29.4 pg (ref 26.0–34.0)
MCHC: 33 g/dL (ref 30.0–36.0)
MCV: 89 fL (ref 80.0–100.0)
MONOS PCT: 4 %
Monocytes Absolute: 0.4 10*3/uL (ref 0.1–1.0)
NEUTROS ABS: 6.7 10*3/uL (ref 1.7–7.7)
NEUTROS PCT: 86 %
Platelets: 58 10*3/uL — ABNORMAL LOW (ref 150–400)
RBC: 5.45 MIL/uL (ref 4.22–5.81)
RDW: 17.9 % — AB (ref 11.5–15.5)
WBC: 7.9 10*3/uL (ref 4.0–10.5)
nRBC: 0.8 % — ABNORMAL HIGH (ref 0.0–0.2)

## 2018-01-05 LAB — COMPREHENSIVE METABOLIC PANEL
ALBUMIN: 3 g/dL — AB (ref 3.5–5.0)
ALK PHOS: 144 U/L — AB (ref 38–126)
ALT: 127 U/L — AB (ref 0–44)
AST: 132 U/L — AB (ref 15–41)
Anion gap: 10 (ref 5–15)
BILIRUBIN TOTAL: 1.2 mg/dL (ref 0.3–1.2)
BUN: 20 mg/dL (ref 8–23)
CALCIUM: 8.2 mg/dL — AB (ref 8.9–10.3)
CO2: 26 mmol/L (ref 22–32)
CREATININE: 0.91 mg/dL (ref 0.61–1.24)
Chloride: 102 mmol/L (ref 98–111)
GFR calc Af Amer: >60 mL/min (ref >60)
GLUCOSE: 126 mg/dL — AB (ref 70–99)
POTASSIUM: 3.2 mmol/L — AB (ref 3.5–5.1)
Sodium: 138 mmol/L (ref 135–145)
Total Protein: 6.1 g/dL — ABNORMAL LOW (ref 6.5–8.1)

## 2018-01-05 NOTE — Patient Instructions (Signed)
[facility] Discharge Instructions     Thank you for choosing [facility] to provide your oncology and hematology care.  To afford each patient quality time with our provider, please arrive at least 15 minutes before your scheduled appointment time.   If you have a lab appointment with the Cancer Center please come in thru the  Main Entrance and check in at the main information desk  You need to re-schedule your appointment should you arrive 10 or more minutes late.  We strive to give you quality time with our providers, and arriving late affects you and other patients whose appointments are after yours.  Also, if you no show three or more times for appointments you may be dismissed from the clinic at the providers discretion.     Again, thank you for choosing [facility].  Our hope is that these requests will decrease the amount of time that you wait before being seen by our physicians.       _____________________________________________________________  Should you have questions after your visit to [facility], please contact our office at [phone] between the hours of 8:00 a.m. and 4:30 p.m.  Voicemails left after 4:00 p.m. will not be returned until the following business day.  For prescription refill requests, have your pharmacy contact our office and allow 72 hours.    Cancer Center Support Programs:   > Cancer Support Group  2nd Tuesday of the month 1pm-2pm, Journey Room    

## 2018-01-05 NOTE — Progress Notes (Signed)
No tx today per Dr. Delton Coombes.

## 2018-01-05 NOTE — Progress Notes (Signed)
Faxed referral and patient records to [facility] [place] [phone].   Transmission fax confirmation received.

## 2018-01-05 NOTE — Progress Notes (Signed)
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[place])    11/17/2017 -  Chemotherapy    The patient had palonosetron (ALOXI) injection 0.25 mg, 0.25 mg, Intravenous,  Once, 2 of 6 cycles Administration: 0.25 mg (11/24/2017), 0.25 mg (12/08/2017) leucovorin 600 mg in dextrose 5 % 250 mL infusion, 305 mg/m2 = 630 mg (80 % of original dose 400 mg/m2), Intravenous,  Once, 2 of 6 cycles Dose modification: 300 mg/m2 (75 % of original dose 400 mg/m2, Cycle 1, Reason: Provider Judgment), 320 mg/m2 (80 % of original dose 400 mg/m2, Cycle 1, Reason: Provider Judgment) Administration: 600 mg (11/24/2017), 600 mg (12/08/2017) oxaliplatin (ELOXATIN) 135 mg in dextrose 5 % 500 mL chemo infusion, 68 mg/m2 = 135 mg (80 % of original dose 85 mg/m2), Intravenous,  Once, 2 of 6 cycles Dose modification: 63.75 mg/m2 (75 % of original dose 85 mg/m2, Cycle 1, Reason: Provider Judgment), 68 mg/m2 (80 % of original dose 85 mg/m2, Cycle 1, Reason: Provider Judgment) Administration: 135 mg (11/24/2017), 135 mg (12/08/2017) fluorouracil (ADRUCIL) chemo injection 650 mg, 320 mg/m2 = 650 mg (80 % of original dose 400 mg/m2), Intravenous,  Once, 2 of 6 cycles Dose modification: 300 mg/m2 (75 % of original dose 400 mg/m2, Cycle 1, Reason: Provider Judgment), 320 mg/m2 (80 % of original dose 400 mg/m2, Cycle 1, Reason: Provider Judgment) Administration: 650 mg (11/24/2017), 650 mg (12/08/2017) fluorouracil (ADRUCIL) 3,800 mg in sodium chloride 0.9 % 74 mL chemo infusion, 1,920 mg/m2 = 3,800 mg (80 % of original dose 2,400 mg/m2), Intravenous,  1 Day/Dose, 2 of 6 cycles Dose modification: 1,800 mg/m2 (75 % of original dose 2,400 mg/m2, Cycle 1, Reason: Provider Judgment), 1,920 mg/m2 (80 % of original dose 2,400 mg/m2, Cycle 1, Reason: Provider Judgment) Administration: 3,800 mg (11/24/2017), 3,800 mg (12/08/2017)  for chemotherapy treatment.      INTERVAL HISTORY:  [name] 67 y.o. 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 Patient here with his caregiver from the [facility]. He is mostly wheelchair dependent and is weaker every visit. He can stand to assist to chair to bed but is too weak to stand for long periods. He falls asleep while talking to him and is slow to respond. He is very fatigued throughout the day. He denies any new pains. Denies any GI issues. Denies any fevers or recent infections. He reports his appetite at 50% and his energy level at 25%.     REVIEW OF SYSTEMS:  Review of Systems  Constitutional: Positive for fatigue.  Respiratory: Positive for cough and shortness of breath.   Gastrointestinal: Positive for abdominal pain.  Neurological: Positive for extremity weakness.  All other systems reviewed and are negative.    PAST MEDICAL/SURGICAL HISTORY:  Past Medical History:  Diagnosis Date  . Arthritis   . Bipolar disorder ([place])   . Brain cancer ([place]) 2019  . CAD (coronary artery disease) 08/16/2016  . Cancer ([place])    esophageal with liver mets  . Cellulitis and abscess of foot 03/05/2016  . Chronic combined systolic and diastolic heart failure ([place])   .  Diabetes mellitus without complication ([place])    boderline  . GERD (gastroesophageal reflux disease)   . Heart murmur 1995  . History of kidney stones   . Hypercholesteremia   . Hypertension   . Kidney stones   . Liver cancer ([place]) 2019  . Myocardial infarction ([place])   . OSA (obstructive sleep apnea)    no cpap, can't tolerate  . Schizoaffective disorder, bipolar type ([place]) 04/25/2016   Past  Surgical History:  Procedure Laterality Date  . APPENDECTOMY    . BIOPSY  03/09/2017   Procedure: BIOPSY;  Surgeon: Danie Binder, MD;  [facility];  Service: Endoscopy;;  gastric esophageal  . BIOPSY  11/23/2017   Procedure: BIOPSY;  Surgeon: Danie Binder, MD;  [facility];  Service: Endoscopy;;  esophagus  . CHOLECYSTECTOMY N/A 10/20/2013   Procedure: LAPAROSCOPIC CHOLECYSTECTOMY;  Surgeon: [name], MD;  Location: [facility];  Service: General;  Laterality: N/A;  . COLONOSCOPY WITH PROPOFOL N/A 03/09/2017   Procedure: COLONOSCOPY WITH PROPOFOL;  Surgeon: Danie Binder, MD;  [facility];  Service: Endoscopy;  Laterality: N/A;  12:15pm  . ESOPHAGOGASTRODUODENOSCOPY (EGD) WITH PROPOFOL N/A 03/09/2017   Procedure: ESOPHAGOGASTRODUODENOSCOPY (EGD) WITH PROPOFOL;  Surgeon: Danie Binder, MD;  [facility];  Service: Endoscopy;  Laterality: N/A;  . ESOPHAGOGASTRODUODENOSCOPY (EGD) WITH PROPOFOL N/A 11/23/2017   Procedure: ESOPHAGOGASTRODUODENOSCOPY (EGD) WITH PROPOFOL;  Surgeon: Danie Binder, MD;  [facility];  Service: Endoscopy;  Laterality: N/A;  10:00am  . EUS N/A 04/08/2017   Procedure: UPPER ENDOSCOPIC ULTRASOUND (EUS) RADIAL;  Surgeon: Milus Banister, MD;  Location: [facility];  Service: Endoscopy;  Laterality: N/A;  . HIP SURGERY Left   . KNEE SURGERY Left   . POLYPECTOMY  03/09/2017   Procedure: POLYPECTOMY;  Surgeon: Danie Binder, MD;  [facility];  Service: Endoscopy;;  colon   . PORTACATH PLACEMENT Left 04/07/2017   Procedure: INSERTION PORT-A-CATH LEFT SUBCLAVIAN;  Surgeon: Virl Cagey, MD;  Location: [facility];  Service: General;  Laterality: Left;  . SAVORY DILATION N/A 03/09/2017   Procedure: SAVORY DILATION;  Surgeon: Danie Binder, MD;  [facility];  Service: Endoscopy;  Laterality: N/A;     SOCIAL HISTORY:  Social History   Socioeconomic History  . Marital status:  Divorced    Spouse name: Not on file  . Number of children: Not on file  . Years of education: Not on file  . Highest education level: Not on file  Occupational History  . Not on file  Social Needs  . Financial resource strain: Not on file  . Food insecurity:    Worry: Not on file    Inability: Not on file  . Transportation needs:    Medical: No    Non-medical: No  Tobacco Use  . Smoking status: Never Smoker  . Smokeless tobacco: Never Used  Substance and Sexual Activity  . Alcohol use: No  . Drug use: No  . Sexual activity: Not Currently    Birth control/protection: Implant  Lifestyle  . Physical activity:    Days per week: Not on file    Minutes per session: Not on file  . Stress: Not on file  Relationships  . Social connections:    Talks on phone: Not on file    Gets together: Not on file    Attends religious service: Not on file    Active member of  club or organization: Not on file    Attends meetings of clubs or organizations: Not on file    Relationship status: Not on file  . Intimate partner violence:    Fear of current or ex partner: Not on file    Emotionally abused: Not on file    Physically abused: Not on file    Forced sexual activity: Not on file  Other Topics Concern  . Not on file  Social History Narrative  . Not on file    FAMILY HISTORY:  Family History  Problem Relation Age of Onset  . Hypertension Mother   . Dementia Mother   . Cancer Mother        breast  . Colon cancer Neg Hx   . Colon polyps Neg Hx     CURRENT MEDICATIONS:  Outpatient Encounter Medications as of 01/05/2018  Medication Sig  . acetaminophen (TYLENOL) 325 MG tablet Take 650 mg by mouth every 6 (six) hours as needed for moderate pain.   Marland Kitchen alum & mag hydroxide-simeth (MAALOX/MYLANTA) 200-200-20 MG/5ML suspension Take 30 mLs by mouth every 4 (four) hours as needed for indigestion.  . AMITIZA 24 MCG capsule Take 24 mcg by mouth 2 (two) times a week.   . B-D UF III MINI  PEN NEEDLES 31G X 5 MM MISC   . Blood Glucose Monitoring Suppl (ONETOUCH VERIO) w/Device KIT 1 each by Does not apply route as needed.  . carvedilol (COREG) 12.5 MG tablet TAKE 1 TABLET(12.5 MG) BY MOUTH TWICE DAILY (Patient taking differently: Take 12.5 mg by mouth 2 (two) times daily with a meal. )  . dexamethasone (DECADRON) 2 MG tablet Take 1 tablet (2 mg total) by mouth 2 (two) times daily.  Marland Kitchen ENTRESTO 49-51 MG TAKE 1 TABLET BY MOUTH TWICE DAILY (Patient taking differently: Take 1 tablet by mouth 2 (two) times daily. )  . feeding supplement, GLUCERNA SHAKE, (GLUCERNA SHAKE) LIQD Take 237 mLs by mouth 2 (two) times daily between meals.  . furosemide (LASIX) 40 MG tablet Alternate 40 mg one day and 20 mg the next (Patient taking differently: Take 40 mg by mouth daily. )  . gabapentin (NEURONTIN) 100 MG capsule Take 1 capsule (100 mg total) by mouth 2 (two) times daily.  Marland Kitchen glucose blood test strip 1 each by Other route 4 (four) times daily. Use as instructed qid One Touch ultra  . insulin aspart (NOVOLOG) 100 UNIT/ML injection Inject 0-20 Units into the skin 3 (three) times daily with meals.  . insulin aspart (NOVOLOG) 100 UNIT/ML injection Inject 6 Units into the skin 3 (three) times daily with meals.  . insulin aspart (NOVOLOG) 100 UNIT/ML injection Inject 0-5 Units into the skin at bedtime.  . insulin glargine (LANTUS) 100 UNIT/ML injection Inject 0.35 mLs (35 Units total) into the skin at bedtime.  . milk thistle 175 MG tablet Take 175 mg by mouth daily.  Marland Kitchen OLANZapine (ZYPREXA) 5 MG tablet Take 5 mg by mouth 2 (two) times daily.  . ondansetron (ZOFRAN ODT) 8 MG disintegrating tablet Take 1 tablet (8 mg total) by mouth every 8 (eight) hours as needed for nausea or vomiting.  . pantoprazole (PROTONIX) 40 MG tablet Take 1 tablet (40 mg total) by mouth 2 (two) times daily before a meal.  . potassium chloride SA (K-DUR,KLOR-CON) 20 MEQ tablet Take 20 mEq by mouth daily.  . prochlorperazine  (COMPAZINE) 10 MG tablet Take 1 tablet (10 mg total) by mouth every 6 (six) hours as needed  for nausea or vomiting.  . rosuvastatin (CRESTOR) 10 MG tablet Take 1 tablet (10 mg total) by mouth daily.  . vitamin C (ASCORBIC ACID) 250 MG tablet Take by mouth.   No facility-administered encounter medications on file as of 01/05/2018.     ALLERGIES:  Allergies  Allergen Reactions  . Codeine Nausea And Vomiting  . Morphine And Related Nausea And Vomiting     PHYSICAL EXAM:  ECOG Performance status: 1  Vitals:   01/05/18 0836  BP: 133/76  Pulse: (!) 120  Resp: 18  Temp: (!) 97.4 F (36.3 C)  SpO2: 96%   There were no vitals filed for this visit.  Physical Exam  Constitutional: He is oriented to person, place, and time. He appears well-developed and well-nourished.  Musculoskeletal: Normal range of motion.  Neurological: He is alert and oriented to person, place, and time.  Skin: Skin is warm and dry.  Psychiatric: He has a normal mood and affect. His behavior is normal. Judgment and thought content normal.     LABORATORY DATA:  I have reviewed the labs as listed.  CBC    Component Value Date/Time   WBC 7.9 01/05/2018 0805   RBC 5.45 01/05/2018 0805   HGB 16.0 01/05/2018 0805   HCT 48.5 01/05/2018 0805   PLT 58 (L) 01/05/2018 0805   MCV 89.0 01/05/2018 0805   MCH 29.4 01/05/2018 0805   MCHC 33.0 01/05/2018 0805   RDW 17.9 (H) 01/05/2018 0805   LYMPHSABS 0.6 (L) 01/05/2018 0805   MONOABS 0.4 01/05/2018 0805   EOSABS 0.0 01/05/2018 0805   BASOSABS 0.0 01/05/2018 0805   CMP Latest Ref Rng & Units 01/05/2018 01/01/2018 12/28/2017  Glucose 70 - 99 mg/dL 126(H) 281(H) 261(H)  BUN 8 - 23 mg/dL _0 Creatinine 0.61 - 1.24 mg/dL 0.91 0.94 0.81  Sodium 135 - 145 mmol/L 138 138 137  Potassium 3.5 - 5.1 mmol/L 3.2(L) 3.6 3.9  Chloride 98 - 111 mmol/L 102 96(L) 103  CO2 22 - 32 mmol/L 26 32 26  Calcium 8.9 - 10.3 mg/dL 8.2(L) 7.8(L) 7.2(L)  Total Protein 6.5 - 8.1  g/dL 6.1(L) - 4.6(L)  Total Bilirubin 0.3 - 1.2 mg/dL 1.2 - 0.8  Alkaline Phos 38 - 126 U/L 144(H) - 89  AST 15 - 41 U/L 132(H) - 29  ALT 0 - 44 U/L 127(H) - 31       DIAGNOSTIC IMAGING:  I have reviewed images of his brain.     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  Metastatic GE junction adenocarcinoma: -7 cm long, bulky, focally circumferential, with nearby satellite lesion 2 cm distal to the GE junction, UT3N0 (original diagnosis) -Radiation therapy from 04/19/2017 through 05/26/2017, could receive only 2 cycles of carboplatin currently secondary to cytopenias. -Posttreatment completion CT scan of the chest, abdomen and pelvis dated 07/19/2017 showed nonspecific persistent moderate irregular circumferential wall thickening in the lower thoracic esophagus, decreased from prior scans.  He was evaluated by Dr. Servando Snare who did not think he was a candidate for surgery. -Surveillance CT CAP on 10/25/2017 shows disease progression with development of bilateral hepatic metastasis.  Enlargement of a portacaval lymph node also suspicious for metastatic disease. 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 - The L1 CPS score of 1%, foundation 1 shows MS-stable, TMB-low, ER BB 3+, F GFR 1 amplification, MET amplification equivocal - 2 cycles of FOLFOX in  the metastatic setting from 11/24/2017 through 12/08/2017.   -He developed brain meta stasis and received radiation therapy. - He was sent to [facility] on Saturday.  He is able to walk with a walker until last night.  This morning he felt weak.  He is on dexamethasone 2 mg twice daily. - We reviewed his blood counts today.  Platelet count is 58,000.  Based on his functional status and blood counts, I do not believe he is a candidate for systemic therapy.  I talked to his healthcare power of attorney Langley Gauss over the phone.  She also agrees with my  assessment.  Patient is also agreeable.  I have recommended palliative care in the form of hospice.   Total time spent is 40 minutes with more than 50% of the time spent face-to-face discussing diagnosis, prognosis, recommendations about hospice and coordination of care.    Orders placed this encounter:  No orders of the defined types were placed in this encounter.     Derek Jack, MD [place] [phone]

## 2018-01-05 NOTE — Assessment & Plan Note (Signed)
1.  Metastatic GE junction adenocarcinoma: -7 cm long, bulky, focally circumferential, with nearby satellite lesion 2 cm distal to the GE junction, UT3N0 (original diagnosis) -Radiation therapy from 04/19/2017 through 05/26/2017, could receive only 2 cycles of carboplatin currently secondary to cytopenias. -Posttreatment completion CT scan of the chest, abdomen and pelvis dated 07/19/2017 showed nonspecific persistent moderate irregular circumferential wall thickening in the lower thoracic esophagus, decreased from prior scans.  He was evaluated by Dr. Servando Snare who did not think he was a candidate for surgery. -Surveillance CT CAP on 10/25/2017 shows disease progression with development of bilateral hepatic metastasis.  Enlargement of a portacaval lymph node also suspicious for metastatic disease. 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 - The L1 CPS score of 1%, foundation 1 shows MS-stable, TMB-low, ER BB 3+, F GFR 1 amplification, MET amplification equivocal - 2 cycles of FOLFOX in the metastatic setting from 11/24/2017 through 12/08/2017.   -He developed brain meta stasis and received radiation therapy. - He was sent to [facility] on Saturday.  He is able to walk with a walker until last night.  This morning he felt weak.  He is on dexamethasone 2 mg twice daily. - We reviewed his blood counts today.  Platelet count is 58,000.  Based on his functional status and blood counts, I do not believe he is a candidate for systemic therapy.  I talked to his healthcare power of attorney Langley Gauss over the phone.  She also agrees with my assessment.  Patient is also agreeable.  I have recommended palliative care in the form of hospice.

## 2018-01-06 DIAGNOSIS — I10 Essential (primary) hypertension: Secondary | ICD-10-CM | POA: Diagnosis not present

## 2018-01-06 DIAGNOSIS — I509 Heart failure, unspecified: Secondary | ICD-10-CM | POA: Diagnosis not present

## 2018-01-06 DIAGNOSIS — E119 Type 2 diabetes mellitus without complications: Secondary | ICD-10-CM | POA: Diagnosis not present

## 2018-01-06 DIAGNOSIS — I519 Heart disease, unspecified: Secondary | ICD-10-CM | POA: Diagnosis not present

## 2018-01-06 DIAGNOSIS — C159 Malignant neoplasm of esophagus, unspecified: Secondary | ICD-10-CM | POA: Diagnosis not present

## 2018-01-06 DIAGNOSIS — F250 Schizoaffective disorder, bipolar type: Secondary | ICD-10-CM | POA: Diagnosis not present

## 2018-01-07 ENCOUNTER — Encounter (HOSPITAL_COMMUNITY): Payer: Self-pay

## 2018-01-07 DIAGNOSIS — F250 Schizoaffective disorder, bipolar type: Secondary | ICD-10-CM | POA: Diagnosis not present

## 2018-01-07 DIAGNOSIS — I509 Heart failure, unspecified: Secondary | ICD-10-CM | POA: Diagnosis not present

## 2018-01-07 DIAGNOSIS — C159 Malignant neoplasm of esophagus, unspecified: Secondary | ICD-10-CM | POA: Diagnosis not present

## 2018-01-07 DIAGNOSIS — I10 Essential (primary) hypertension: Secondary | ICD-10-CM | POA: Diagnosis not present

## 2018-01-07 DIAGNOSIS — E785 Hyperlipidemia, unspecified: Secondary | ICD-10-CM | POA: Diagnosis not present

## 2018-01-07 DIAGNOSIS — E119 Type 2 diabetes mellitus without complications: Secondary | ICD-10-CM | POA: Diagnosis not present

## 2018-01-07 DIAGNOSIS — D649 Anemia, unspecified: Secondary | ICD-10-CM | POA: Diagnosis not present

## 2018-01-07 DIAGNOSIS — I519 Heart disease, unspecified: Secondary | ICD-10-CM | POA: Diagnosis not present

## 2018-01-10 DIAGNOSIS — C159 Malignant neoplasm of esophagus, unspecified: Secondary | ICD-10-CM | POA: Diagnosis not present

## 2018-01-10 DIAGNOSIS — I10 Essential (primary) hypertension: Secondary | ICD-10-CM | POA: Diagnosis not present

## 2018-01-10 DIAGNOSIS — E119 Type 2 diabetes mellitus without complications: Secondary | ICD-10-CM | POA: Diagnosis not present

## 2018-01-10 DIAGNOSIS — F250 Schizoaffective disorder, bipolar type: Secondary | ICD-10-CM | POA: Diagnosis not present

## 2018-01-10 DIAGNOSIS — I519 Heart disease, unspecified: Secondary | ICD-10-CM | POA: Diagnosis not present

## 2018-01-10 DIAGNOSIS — C7931 Secondary malignant neoplasm of brain: Secondary | ICD-10-CM | POA: Diagnosis not present

## 2018-01-10 DIAGNOSIS — E1100 Type 2 diabetes mellitus with hyperosmolarity without nonketotic hyperglycemic-hyperosmolar coma (NKHHC): Secondary | ICD-10-CM | POA: Diagnosis not present

## 2018-01-10 DIAGNOSIS — I509 Heart failure, unspecified: Secondary | ICD-10-CM | POA: Diagnosis not present

## 2018-01-11 DIAGNOSIS — I509 Heart failure, unspecified: Secondary | ICD-10-CM | POA: Diagnosis not present

## 2018-01-11 DIAGNOSIS — E119 Type 2 diabetes mellitus without complications: Secondary | ICD-10-CM | POA: Diagnosis not present

## 2018-01-11 DIAGNOSIS — I519 Heart disease, unspecified: Secondary | ICD-10-CM | POA: Diagnosis not present

## 2018-01-11 DIAGNOSIS — C159 Malignant neoplasm of esophagus, unspecified: Secondary | ICD-10-CM | POA: Diagnosis not present

## 2018-01-11 DIAGNOSIS — I10 Essential (primary) hypertension: Secondary | ICD-10-CM | POA: Diagnosis not present

## 2018-01-11 DIAGNOSIS — F250 Schizoaffective disorder, bipolar type: Secondary | ICD-10-CM | POA: Diagnosis not present

## 2018-01-12 ENCOUNTER — Ambulatory Visit (HOSPITAL_COMMUNITY): Payer: Self-pay | Admitting: Hematology

## 2018-01-12 ENCOUNTER — Other Ambulatory Visit (HOSPITAL_COMMUNITY): Payer: Self-pay

## 2018-01-12 ENCOUNTER — Ambulatory Visit (HOSPITAL_COMMUNITY): Payer: Self-pay

## 2018-01-13 ENCOUNTER — Other Ambulatory Visit: Payer: Self-pay | Admitting: Nurse Practitioner

## 2018-01-13 DIAGNOSIS — K219 Gastro-esophageal reflux disease without esophagitis: Secondary | ICD-10-CM

## 2018-01-13 DIAGNOSIS — I509 Heart failure, unspecified: Secondary | ICD-10-CM

## 2018-01-13 DIAGNOSIS — C160 Malignant neoplasm of cardia: Secondary | ICD-10-CM

## 2018-01-14 ENCOUNTER — Encounter (HOSPITAL_COMMUNITY): Payer: Self-pay

## 2018-01-16 DIAGNOSIS — K219 Gastro-esophageal reflux disease without esophagitis: Secondary | ICD-10-CM | POA: Diagnosis not present

## 2018-01-16 DIAGNOSIS — I10 Essential (primary) hypertension: Secondary | ICD-10-CM | POA: Diagnosis not present

## 2018-01-16 DIAGNOSIS — F250 Schizoaffective disorder, bipolar type: Secondary | ICD-10-CM | POA: Diagnosis not present

## 2018-01-16 DIAGNOSIS — R531 Weakness: Secondary | ICD-10-CM | POA: Diagnosis not present

## 2018-01-16 DIAGNOSIS — C159 Malignant neoplasm of esophagus, unspecified: Secondary | ICD-10-CM | POA: Diagnosis not present

## 2018-01-16 DIAGNOSIS — E119 Type 2 diabetes mellitus without complications: Secondary | ICD-10-CM | POA: Diagnosis not present

## 2018-01-16 DIAGNOSIS — N183 Chronic kidney disease, stage 3 (moderate): Secondary | ICD-10-CM | POA: Diagnosis not present

## 2018-01-16 DIAGNOSIS — F3110 Bipolar disorder, current episode manic without psychotic features, unspecified: Secondary | ICD-10-CM | POA: Diagnosis not present

## 2018-01-16 DIAGNOSIS — I519 Heart disease, unspecified: Secondary | ICD-10-CM | POA: Diagnosis not present

## 2018-01-16 DIAGNOSIS — I509 Heart failure, unspecified: Secondary | ICD-10-CM | POA: Diagnosis not present

## 2018-01-17 ENCOUNTER — Ambulatory Visit: Payer: Self-pay | Admitting: Cardiovascular Disease

## 2018-01-17 DIAGNOSIS — F250 Schizoaffective disorder, bipolar type: Secondary | ICD-10-CM | POA: Diagnosis not present

## 2018-01-17 DIAGNOSIS — E1100 Type 2 diabetes mellitus with hyperosmolarity without nonketotic hyperglycemic-hyperosmolar coma (NKHHC): Secondary | ICD-10-CM | POA: Diagnosis not present

## 2018-01-17 DIAGNOSIS — C7931 Secondary malignant neoplasm of brain: Secondary | ICD-10-CM | POA: Diagnosis not present

## 2018-01-17 DIAGNOSIS — I10 Essential (primary) hypertension: Secondary | ICD-10-CM | POA: Diagnosis not present

## 2018-01-17 DIAGNOSIS — I509 Heart failure, unspecified: Secondary | ICD-10-CM | POA: Diagnosis not present

## 2018-01-17 DIAGNOSIS — C159 Malignant neoplasm of esophagus, unspecified: Secondary | ICD-10-CM | POA: Diagnosis not present

## 2018-01-17 DIAGNOSIS — E119 Type 2 diabetes mellitus without complications: Secondary | ICD-10-CM | POA: Diagnosis not present

## 2018-01-17 DIAGNOSIS — R0602 Shortness of breath: Secondary | ICD-10-CM | POA: Diagnosis not present

## 2018-01-17 DIAGNOSIS — I519 Heart disease, unspecified: Secondary | ICD-10-CM | POA: Diagnosis not present

## 2018-01-18 DIAGNOSIS — I10 Essential (primary) hypertension: Secondary | ICD-10-CM | POA: Diagnosis not present

## 2018-01-18 DIAGNOSIS — I509 Heart failure, unspecified: Secondary | ICD-10-CM | POA: Diagnosis not present

## 2018-01-18 DIAGNOSIS — C159 Malignant neoplasm of esophagus, unspecified: Secondary | ICD-10-CM | POA: Diagnosis not present

## 2018-01-18 DIAGNOSIS — E119 Type 2 diabetes mellitus without complications: Secondary | ICD-10-CM | POA: Diagnosis not present

## 2018-01-18 DIAGNOSIS — I519 Heart disease, unspecified: Secondary | ICD-10-CM | POA: Diagnosis not present

## 2018-01-18 DIAGNOSIS — F250 Schizoaffective disorder, bipolar type: Secondary | ICD-10-CM | POA: Diagnosis not present

## 2018-01-19 ENCOUNTER — Ambulatory Visit (HOSPITAL_COMMUNITY): Payer: Self-pay | Admitting: Hematology

## 2018-01-19 ENCOUNTER — Other Ambulatory Visit (HOSPITAL_COMMUNITY): Payer: Self-pay

## 2018-01-19 ENCOUNTER — Ambulatory Visit (HOSPITAL_COMMUNITY): Payer: Self-pay

## 2018-01-20 DIAGNOSIS — E119 Type 2 diabetes mellitus without complications: Secondary | ICD-10-CM | POA: Diagnosis not present

## 2018-01-20 DIAGNOSIS — F250 Schizoaffective disorder, bipolar type: Secondary | ICD-10-CM | POA: Diagnosis not present

## 2018-01-20 DIAGNOSIS — I10 Essential (primary) hypertension: Secondary | ICD-10-CM | POA: Diagnosis not present

## 2018-01-20 DIAGNOSIS — I519 Heart disease, unspecified: Secondary | ICD-10-CM | POA: Diagnosis not present

## 2018-01-20 DIAGNOSIS — C159 Malignant neoplasm of esophagus, unspecified: Secondary | ICD-10-CM | POA: Diagnosis not present

## 2018-01-20 DIAGNOSIS — I509 Heart failure, unspecified: Secondary | ICD-10-CM | POA: Diagnosis not present

## 2018-01-20 NOTE — Progress Notes (Signed)
  Radiation Oncology         [phone] ________________________________  Name: [name] MRN: [id number]  Date: 12/30/2017  DOB: [birth year]  End of Treatment Note  Diagnosis:   67 y.o. 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   There were no signs of acute toxicity after treatment.  Plan: The patient has completed radiation treatment. The patient will return to radiation oncology clinic for routine followup in one month. I advised the patient to call or return sooner if they have any questions or concerns related to their recovery or treatment. ________________________________  Jodelle Gross, MD, PhD  This document serves as a record of services personally performed by Kyung Rudd, MD. It was created on his behalf by Rae Lips, a trained medical scribe. The creation of this record is based on the scribe's personal observations and the provider's statements to them. This document has been checked and approved by the attending provider.

## 2018-01-21 ENCOUNTER — Encounter (HOSPITAL_COMMUNITY): Payer: Self-pay

## 2018-01-24 DIAGNOSIS — I519 Heart disease, unspecified: Secondary | ICD-10-CM | POA: Diagnosis not present

## 2018-01-24 DIAGNOSIS — I10 Essential (primary) hypertension: Secondary | ICD-10-CM | POA: Diagnosis not present

## 2018-01-24 DIAGNOSIS — E119 Type 2 diabetes mellitus without complications: Secondary | ICD-10-CM | POA: Diagnosis not present

## 2018-01-24 DIAGNOSIS — I509 Heart failure, unspecified: Secondary | ICD-10-CM | POA: Diagnosis not present

## 2018-01-24 DIAGNOSIS — F250 Schizoaffective disorder, bipolar type: Secondary | ICD-10-CM | POA: Diagnosis not present

## 2018-01-24 DIAGNOSIS — C159 Malignant neoplasm of esophagus, unspecified: Secondary | ICD-10-CM | POA: Diagnosis not present

## 2018-01-25 DIAGNOSIS — I509 Heart failure, unspecified: Secondary | ICD-10-CM | POA: Diagnosis not present

## 2018-01-25 DIAGNOSIS — C159 Malignant neoplasm of esophagus, unspecified: Secondary | ICD-10-CM | POA: Diagnosis not present

## 2018-01-25 DIAGNOSIS — I10 Essential (primary) hypertension: Secondary | ICD-10-CM | POA: Diagnosis not present

## 2018-01-25 DIAGNOSIS — E119 Type 2 diabetes mellitus without complications: Secondary | ICD-10-CM | POA: Diagnosis not present

## 2018-01-25 DIAGNOSIS — I519 Heart disease, unspecified: Secondary | ICD-10-CM | POA: Diagnosis not present

## 2018-01-25 DIAGNOSIS — F250 Schizoaffective disorder, bipolar type: Secondary | ICD-10-CM | POA: Diagnosis not present

## 2018-01-26 DIAGNOSIS — E1100 Type 2 diabetes mellitus with hyperosmolarity without nonketotic hyperglycemic-hyperosmolar coma (NKHHC): Secondary | ICD-10-CM | POA: Diagnosis not present

## 2018-01-26 DIAGNOSIS — F250 Schizoaffective disorder, bipolar type: Secondary | ICD-10-CM | POA: Diagnosis not present

## 2018-01-26 DIAGNOSIS — I519 Heart disease, unspecified: Secondary | ICD-10-CM | POA: Diagnosis not present

## 2018-01-26 DIAGNOSIS — C7931 Secondary malignant neoplasm of brain: Secondary | ICD-10-CM | POA: Diagnosis not present

## 2018-01-26 DIAGNOSIS — I10 Essential (primary) hypertension: Secondary | ICD-10-CM | POA: Diagnosis not present

## 2018-01-26 DIAGNOSIS — C159 Malignant neoplasm of esophagus, unspecified: Secondary | ICD-10-CM | POA: Diagnosis not present

## 2018-01-26 DIAGNOSIS — E119 Type 2 diabetes mellitus without complications: Secondary | ICD-10-CM | POA: Diagnosis not present

## 2018-01-26 DIAGNOSIS — I509 Heart failure, unspecified: Secondary | ICD-10-CM | POA: Diagnosis not present

## 2018-01-27 DIAGNOSIS — E119 Type 2 diabetes mellitus without complications: Secondary | ICD-10-CM | POA: Diagnosis not present

## 2018-01-27 DIAGNOSIS — I519 Heart disease, unspecified: Secondary | ICD-10-CM | POA: Diagnosis not present

## 2018-01-27 DIAGNOSIS — I10 Essential (primary) hypertension: Secondary | ICD-10-CM | POA: Diagnosis not present

## 2018-01-27 DIAGNOSIS — I509 Heart failure, unspecified: Secondary | ICD-10-CM | POA: Diagnosis not present

## 2018-01-27 DIAGNOSIS — F250 Schizoaffective disorder, bipolar type: Secondary | ICD-10-CM | POA: Diagnosis not present

## 2018-01-27 DIAGNOSIS — C159 Malignant neoplasm of esophagus, unspecified: Secondary | ICD-10-CM | POA: Diagnosis not present

## 2018-01-31 DIAGNOSIS — I10 Essential (primary) hypertension: Secondary | ICD-10-CM | POA: Diagnosis not present

## 2018-01-31 DIAGNOSIS — I2510 Atherosclerotic heart disease of native coronary artery without angina pectoris: Secondary | ICD-10-CM | POA: Diagnosis not present

## 2018-01-31 DIAGNOSIS — E1100 Type 2 diabetes mellitus with hyperosmolarity without nonketotic hyperglycemic-hyperosmolar coma (NKHHC): Secondary | ICD-10-CM | POA: Diagnosis not present

## 2018-01-31 DIAGNOSIS — I509 Heart failure, unspecified: Secondary | ICD-10-CM | POA: Diagnosis not present

## 2018-02-01 ENCOUNTER — Telehealth: Payer: Self-pay | Admitting: *Deleted

## 2018-02-01 DIAGNOSIS — C159 Malignant neoplasm of esophagus, unspecified: Secondary | ICD-10-CM | POA: Diagnosis not present

## 2018-02-01 DIAGNOSIS — I509 Heart failure, unspecified: Secondary | ICD-10-CM | POA: Diagnosis not present

## 2018-02-01 DIAGNOSIS — I519 Heart disease, unspecified: Secondary | ICD-10-CM | POA: Diagnosis not present

## 2018-02-01 DIAGNOSIS — E119 Type 2 diabetes mellitus without complications: Secondary | ICD-10-CM | POA: Diagnosis not present

## 2018-02-01 DIAGNOSIS — I10 Essential (primary) hypertension: Secondary | ICD-10-CM | POA: Diagnosis not present

## 2018-02-01 DIAGNOSIS — I5033 Acute on chronic diastolic (congestive) heart failure: Secondary | ICD-10-CM | POA: Diagnosis not present

## 2018-02-01 DIAGNOSIS — F250 Schizoaffective disorder, bipolar type: Secondary | ICD-10-CM | POA: Diagnosis not present

## 2018-02-01 NOTE — Telephone Encounter (Signed)
Called patient to alter fu appt. per [name], spoke with power of attorney [name] and she asked that this appt. Be cancelled due to patient being in [place], cancelled and noted info in appt. slip

## 2018-02-02 DIAGNOSIS — C159 Malignant neoplasm of esophagus, unspecified: Secondary | ICD-10-CM | POA: Diagnosis not present

## 2018-02-02 DIAGNOSIS — R296 Repeated falls: Secondary | ICD-10-CM | POA: Diagnosis not present

## 2018-02-02 DIAGNOSIS — I519 Heart disease, unspecified: Secondary | ICD-10-CM | POA: Diagnosis not present

## 2018-02-02 DIAGNOSIS — I10 Essential (primary) hypertension: Secondary | ICD-10-CM | POA: Diagnosis not present

## 2018-02-02 DIAGNOSIS — R600 Localized edema: Secondary | ICD-10-CM | POA: Diagnosis not present

## 2018-02-02 DIAGNOSIS — E11649 Type 2 diabetes mellitus with hypoglycemia without coma: Secondary | ICD-10-CM | POA: Diagnosis not present

## 2018-02-02 DIAGNOSIS — I509 Heart failure, unspecified: Secondary | ICD-10-CM | POA: Diagnosis not present

## 2018-02-02 DIAGNOSIS — E119 Type 2 diabetes mellitus without complications: Secondary | ICD-10-CM | POA: Diagnosis not present

## 2018-02-02 DIAGNOSIS — F250 Schizoaffective disorder, bipolar type: Secondary | ICD-10-CM | POA: Diagnosis not present

## 2018-02-03 ENCOUNTER — Ambulatory Visit: Payer: Self-pay | Admitting: Radiation Oncology

## 2018-02-03 DIAGNOSIS — I519 Heart disease, unspecified: Secondary | ICD-10-CM | POA: Diagnosis not present

## 2018-02-03 DIAGNOSIS — I509 Heart failure, unspecified: Secondary | ICD-10-CM | POA: Diagnosis not present

## 2018-02-03 DIAGNOSIS — C159 Malignant neoplasm of esophagus, unspecified: Secondary | ICD-10-CM | POA: Diagnosis not present

## 2018-02-03 DIAGNOSIS — E119 Type 2 diabetes mellitus without complications: Secondary | ICD-10-CM | POA: Diagnosis not present

## 2018-02-03 DIAGNOSIS — F250 Schizoaffective disorder, bipolar type: Secondary | ICD-10-CM | POA: Diagnosis not present

## 2018-02-03 DIAGNOSIS — I10 Essential (primary) hypertension: Secondary | ICD-10-CM | POA: Diagnosis not present

## 2018-02-04 DIAGNOSIS — F250 Schizoaffective disorder, bipolar type: Secondary | ICD-10-CM | POA: Diagnosis not present

## 2018-02-04 DIAGNOSIS — I509 Heart failure, unspecified: Secondary | ICD-10-CM | POA: Diagnosis not present

## 2018-02-04 DIAGNOSIS — I10 Essential (primary) hypertension: Secondary | ICD-10-CM | POA: Diagnosis not present

## 2018-02-04 DIAGNOSIS — E119 Type 2 diabetes mellitus without complications: Secondary | ICD-10-CM | POA: Diagnosis not present

## 2018-02-04 DIAGNOSIS — R05 Cough: Secondary | ICD-10-CM | POA: Diagnosis not present

## 2018-02-04 DIAGNOSIS — C159 Malignant neoplasm of esophagus, unspecified: Secondary | ICD-10-CM | POA: Diagnosis not present

## 2018-02-04 DIAGNOSIS — I519 Heart disease, unspecified: Secondary | ICD-10-CM | POA: Diagnosis not present

## 2018-02-08 DIAGNOSIS — F250 Schizoaffective disorder, bipolar type: Secondary | ICD-10-CM | POA: Diagnosis not present

## 2018-02-08 DIAGNOSIS — I509 Heart failure, unspecified: Secondary | ICD-10-CM | POA: Diagnosis not present

## 2018-02-08 DIAGNOSIS — I519 Heart disease, unspecified: Secondary | ICD-10-CM | POA: Diagnosis not present

## 2018-02-08 DIAGNOSIS — I10 Essential (primary) hypertension: Secondary | ICD-10-CM | POA: Diagnosis not present

## 2018-02-08 DIAGNOSIS — E119 Type 2 diabetes mellitus without complications: Secondary | ICD-10-CM | POA: Diagnosis not present

## 2018-02-08 DIAGNOSIS — C159 Malignant neoplasm of esophagus, unspecified: Secondary | ICD-10-CM | POA: Diagnosis not present

## 2018-02-10 DIAGNOSIS — I509 Heart failure, unspecified: Secondary | ICD-10-CM | POA: Diagnosis not present

## 2018-02-10 DIAGNOSIS — I519 Heart disease, unspecified: Secondary | ICD-10-CM | POA: Diagnosis not present

## 2018-02-10 DIAGNOSIS — C159 Malignant neoplasm of esophagus, unspecified: Secondary | ICD-10-CM | POA: Diagnosis not present

## 2018-02-10 DIAGNOSIS — E119 Type 2 diabetes mellitus without complications: Secondary | ICD-10-CM | POA: Diagnosis not present

## 2018-02-10 DIAGNOSIS — F250 Schizoaffective disorder, bipolar type: Secondary | ICD-10-CM | POA: Diagnosis not present

## 2018-02-10 DIAGNOSIS — I10 Essential (primary) hypertension: Secondary | ICD-10-CM | POA: Diagnosis not present

## 2018-02-10 DIAGNOSIS — D649 Anemia, unspecified: Secondary | ICD-10-CM | POA: Diagnosis not present

## 2018-02-11 DIAGNOSIS — F250 Schizoaffective disorder, bipolar type: Secondary | ICD-10-CM | POA: Diagnosis not present

## 2018-02-11 DIAGNOSIS — I10 Essential (primary) hypertension: Secondary | ICD-10-CM | POA: Diagnosis not present

## 2018-02-11 DIAGNOSIS — C159 Malignant neoplasm of esophagus, unspecified: Secondary | ICD-10-CM | POA: Diagnosis not present

## 2018-02-11 DIAGNOSIS — I519 Heart disease, unspecified: Secondary | ICD-10-CM | POA: Diagnosis not present

## 2018-02-11 DIAGNOSIS — E119 Type 2 diabetes mellitus without complications: Secondary | ICD-10-CM | POA: Diagnosis not present

## 2018-02-11 DIAGNOSIS — I509 Heart failure, unspecified: Secondary | ICD-10-CM | POA: Diagnosis not present

## 2018-02-12 DIAGNOSIS — F250 Schizoaffective disorder, bipolar type: Secondary | ICD-10-CM | POA: Diagnosis not present

## 2018-02-12 DIAGNOSIS — I509 Heart failure, unspecified: Secondary | ICD-10-CM | POA: Diagnosis not present

## 2018-02-12 DIAGNOSIS — I519 Heart disease, unspecified: Secondary | ICD-10-CM | POA: Diagnosis not present

## 2018-02-12 DIAGNOSIS — I10 Essential (primary) hypertension: Secondary | ICD-10-CM | POA: Diagnosis not present

## 2018-02-12 DIAGNOSIS — E119 Type 2 diabetes mellitus without complications: Secondary | ICD-10-CM | POA: Diagnosis not present

## 2018-02-12 DIAGNOSIS — C159 Malignant neoplasm of esophagus, unspecified: Secondary | ICD-10-CM | POA: Diagnosis not present

## 2018-02-14 DIAGNOSIS — I519 Heart disease, unspecified: Secondary | ICD-10-CM | POA: Diagnosis not present

## 2018-02-14 DIAGNOSIS — E119 Type 2 diabetes mellitus without complications: Secondary | ICD-10-CM | POA: Diagnosis not present

## 2018-02-14 DIAGNOSIS — C159 Malignant neoplasm of esophagus, unspecified: Secondary | ICD-10-CM | POA: Diagnosis not present

## 2018-02-14 DIAGNOSIS — I509 Heart failure, unspecified: Secondary | ICD-10-CM | POA: Diagnosis not present

## 2018-02-14 DIAGNOSIS — F250 Schizoaffective disorder, bipolar type: Secondary | ICD-10-CM | POA: Diagnosis not present

## 2018-02-14 DIAGNOSIS — I10 Essential (primary) hypertension: Secondary | ICD-10-CM | POA: Diagnosis not present

## 2018-02-15 ENCOUNTER — Ambulatory Visit: Payer: Medicare Other | Admitting: "Endocrinology

## 2018-02-15 DIAGNOSIS — I509 Heart failure, unspecified: Secondary | ICD-10-CM | POA: Diagnosis not present

## 2018-02-15 DIAGNOSIS — I10 Essential (primary) hypertension: Secondary | ICD-10-CM | POA: Diagnosis not present

## 2018-02-15 DIAGNOSIS — E119 Type 2 diabetes mellitus without complications: Secondary | ICD-10-CM | POA: Diagnosis not present

## 2018-02-15 DIAGNOSIS — C159 Malignant neoplasm of esophagus, unspecified: Secondary | ICD-10-CM | POA: Diagnosis not present

## 2018-02-15 DIAGNOSIS — F250 Schizoaffective disorder, bipolar type: Secondary | ICD-10-CM | POA: Diagnosis not present

## 2018-02-15 DIAGNOSIS — I519 Heart disease, unspecified: Secondary | ICD-10-CM | POA: Diagnosis not present

## 2018-02-16 DIAGNOSIS — I519 Heart disease, unspecified: Secondary | ICD-10-CM | POA: Diagnosis not present

## 2018-02-16 DIAGNOSIS — I10 Essential (primary) hypertension: Secondary | ICD-10-CM | POA: Diagnosis not present

## 2018-02-16 DIAGNOSIS — R531 Weakness: Secondary | ICD-10-CM | POA: Diagnosis not present

## 2018-02-16 DIAGNOSIS — F250 Schizoaffective disorder, bipolar type: Secondary | ICD-10-CM | POA: Diagnosis not present

## 2018-02-16 DIAGNOSIS — N183 Chronic kidney disease, stage 3 (moderate): Secondary | ICD-10-CM | POA: Diagnosis not present

## 2018-02-16 DIAGNOSIS — I509 Heart failure, unspecified: Secondary | ICD-10-CM | POA: Diagnosis not present

## 2018-02-16 DIAGNOSIS — K219 Gastro-esophageal reflux disease without esophagitis: Secondary | ICD-10-CM | POA: Diagnosis not present

## 2018-02-16 DIAGNOSIS — E119 Type 2 diabetes mellitus without complications: Secondary | ICD-10-CM | POA: Diagnosis not present

## 2018-02-16 DIAGNOSIS — C159 Malignant neoplasm of esophagus, unspecified: Secondary | ICD-10-CM | POA: Diagnosis not present

## 2018-02-17 DIAGNOSIS — C159 Malignant neoplasm of esophagus, unspecified: Secondary | ICD-10-CM | POA: Diagnosis not present

## 2018-02-17 DIAGNOSIS — I10 Essential (primary) hypertension: Secondary | ICD-10-CM | POA: Diagnosis not present

## 2018-02-17 DIAGNOSIS — F250 Schizoaffective disorder, bipolar type: Secondary | ICD-10-CM | POA: Diagnosis not present

## 2018-02-17 DIAGNOSIS — E119 Type 2 diabetes mellitus without complications: Secondary | ICD-10-CM | POA: Diagnosis not present

## 2018-02-17 DIAGNOSIS — I509 Heart failure, unspecified: Secondary | ICD-10-CM | POA: Diagnosis not present

## 2018-02-17 DIAGNOSIS — I519 Heart disease, unspecified: Secondary | ICD-10-CM | POA: Diagnosis not present

## 2018-02-18 DIAGNOSIS — I519 Heart disease, unspecified: Secondary | ICD-10-CM | POA: Diagnosis not present

## 2018-02-18 DIAGNOSIS — I509 Heart failure, unspecified: Secondary | ICD-10-CM | POA: Diagnosis not present

## 2018-02-18 DIAGNOSIS — E119 Type 2 diabetes mellitus without complications: Secondary | ICD-10-CM | POA: Diagnosis not present

## 2018-02-18 DIAGNOSIS — F250 Schizoaffective disorder, bipolar type: Secondary | ICD-10-CM | POA: Diagnosis not present

## 2018-02-18 DIAGNOSIS — C159 Malignant neoplasm of esophagus, unspecified: Secondary | ICD-10-CM | POA: Diagnosis not present

## 2018-02-18 DIAGNOSIS — I10 Essential (primary) hypertension: Secondary | ICD-10-CM | POA: Diagnosis not present

## 2018-02-21 DIAGNOSIS — C159 Malignant neoplasm of esophagus, unspecified: Secondary | ICD-10-CM | POA: Diagnosis not present

## 2018-02-21 DIAGNOSIS — F250 Schizoaffective disorder, bipolar type: Secondary | ICD-10-CM | POA: Diagnosis not present

## 2018-02-21 DIAGNOSIS — I509 Heart failure, unspecified: Secondary | ICD-10-CM | POA: Diagnosis not present

## 2018-02-21 DIAGNOSIS — I10 Essential (primary) hypertension: Secondary | ICD-10-CM | POA: Diagnosis not present

## 2018-02-21 DIAGNOSIS — E119 Type 2 diabetes mellitus without complications: Secondary | ICD-10-CM | POA: Diagnosis not present

## 2018-02-21 DIAGNOSIS — I519 Heart disease, unspecified: Secondary | ICD-10-CM | POA: Diagnosis not present

## 2018-02-22 DIAGNOSIS — I10 Essential (primary) hypertension: Secondary | ICD-10-CM | POA: Diagnosis not present

## 2018-02-22 DIAGNOSIS — E119 Type 2 diabetes mellitus without complications: Secondary | ICD-10-CM | POA: Diagnosis not present

## 2018-02-22 DIAGNOSIS — C159 Malignant neoplasm of esophagus, unspecified: Secondary | ICD-10-CM | POA: Diagnosis not present

## 2018-02-22 DIAGNOSIS — I519 Heart disease, unspecified: Secondary | ICD-10-CM | POA: Diagnosis not present

## 2018-02-22 DIAGNOSIS — I509 Heart failure, unspecified: Secondary | ICD-10-CM | POA: Diagnosis not present

## 2018-02-22 DIAGNOSIS — F250 Schizoaffective disorder, bipolar type: Secondary | ICD-10-CM | POA: Diagnosis not present

## 2018-02-23 DIAGNOSIS — I10 Essential (primary) hypertension: Secondary | ICD-10-CM | POA: Diagnosis not present

## 2018-02-23 DIAGNOSIS — I519 Heart disease, unspecified: Secondary | ICD-10-CM | POA: Diagnosis not present

## 2018-02-23 DIAGNOSIS — C159 Malignant neoplasm of esophagus, unspecified: Secondary | ICD-10-CM | POA: Diagnosis not present

## 2018-02-23 DIAGNOSIS — F250 Schizoaffective disorder, bipolar type: Secondary | ICD-10-CM | POA: Diagnosis not present

## 2018-02-23 DIAGNOSIS — I509 Heart failure, unspecified: Secondary | ICD-10-CM | POA: Diagnosis not present

## 2018-02-23 DIAGNOSIS — E119 Type 2 diabetes mellitus without complications: Secondary | ICD-10-CM | POA: Diagnosis not present

## 2018-02-24 DIAGNOSIS — I519 Heart disease, unspecified: Secondary | ICD-10-CM | POA: Diagnosis not present

## 2018-02-24 DIAGNOSIS — E119 Type 2 diabetes mellitus without complications: Secondary | ICD-10-CM | POA: Diagnosis not present

## 2018-02-24 DIAGNOSIS — I509 Heart failure, unspecified: Secondary | ICD-10-CM | POA: Diagnosis not present

## 2018-02-24 DIAGNOSIS — F250 Schizoaffective disorder, bipolar type: Secondary | ICD-10-CM | POA: Diagnosis not present

## 2018-02-24 DIAGNOSIS — I10 Essential (primary) hypertension: Secondary | ICD-10-CM | POA: Diagnosis not present

## 2018-02-24 DIAGNOSIS — C159 Malignant neoplasm of esophagus, unspecified: Secondary | ICD-10-CM | POA: Diagnosis not present

## 2018-02-25 DIAGNOSIS — I519 Heart disease, unspecified: Secondary | ICD-10-CM | POA: Diagnosis not present

## 2018-02-25 DIAGNOSIS — C159 Malignant neoplasm of esophagus, unspecified: Secondary | ICD-10-CM | POA: Diagnosis not present

## 2018-02-25 DIAGNOSIS — I10 Essential (primary) hypertension: Secondary | ICD-10-CM | POA: Diagnosis not present

## 2018-02-25 DIAGNOSIS — I509 Heart failure, unspecified: Secondary | ICD-10-CM | POA: Diagnosis not present

## 2018-02-25 DIAGNOSIS — E119 Type 2 diabetes mellitus without complications: Secondary | ICD-10-CM | POA: Diagnosis not present

## 2018-02-25 DIAGNOSIS — F250 Schizoaffective disorder, bipolar type: Secondary | ICD-10-CM | POA: Diagnosis not present

## 2018-02-28 DIAGNOSIS — E1100 Type 2 diabetes mellitus with hyperosmolarity without nonketotic hyperglycemic-hyperosmolar coma (NKHHC): Secondary | ICD-10-CM | POA: Diagnosis not present

## 2018-02-28 DIAGNOSIS — I10 Essential (primary) hypertension: Secondary | ICD-10-CM | POA: Diagnosis not present

## 2018-02-28 DIAGNOSIS — I519 Heart disease, unspecified: Secondary | ICD-10-CM | POA: Diagnosis not present

## 2018-02-28 DIAGNOSIS — C7931 Secondary malignant neoplasm of brain: Secondary | ICD-10-CM | POA: Diagnosis not present

## 2018-02-28 DIAGNOSIS — E119 Type 2 diabetes mellitus without complications: Secondary | ICD-10-CM | POA: Diagnosis not present

## 2018-02-28 DIAGNOSIS — C159 Malignant neoplasm of esophagus, unspecified: Secondary | ICD-10-CM | POA: Diagnosis not present

## 2018-02-28 DIAGNOSIS — F250 Schizoaffective disorder, bipolar type: Secondary | ICD-10-CM | POA: Diagnosis not present

## 2018-02-28 DIAGNOSIS — I509 Heart failure, unspecified: Secondary | ICD-10-CM | POA: Diagnosis not present

## 2018-03-01 DIAGNOSIS — I509 Heart failure, unspecified: Secondary | ICD-10-CM | POA: Diagnosis not present

## 2018-03-01 DIAGNOSIS — I10 Essential (primary) hypertension: Secondary | ICD-10-CM | POA: Diagnosis not present

## 2018-03-01 DIAGNOSIS — I519 Heart disease, unspecified: Secondary | ICD-10-CM | POA: Diagnosis not present

## 2018-03-01 DIAGNOSIS — C159 Malignant neoplasm of esophagus, unspecified: Secondary | ICD-10-CM | POA: Diagnosis not present

## 2018-03-01 DIAGNOSIS — F250 Schizoaffective disorder, bipolar type: Secondary | ICD-10-CM | POA: Diagnosis not present

## 2018-03-01 DIAGNOSIS — E119 Type 2 diabetes mellitus without complications: Secondary | ICD-10-CM | POA: Diagnosis not present

## 2018-03-02 DIAGNOSIS — I519 Heart disease, unspecified: Secondary | ICD-10-CM | POA: Diagnosis not present

## 2018-03-02 DIAGNOSIS — F250 Schizoaffective disorder, bipolar type: Secondary | ICD-10-CM | POA: Diagnosis not present

## 2018-03-02 DIAGNOSIS — E119 Type 2 diabetes mellitus without complications: Secondary | ICD-10-CM | POA: Diagnosis not present

## 2018-03-02 DIAGNOSIS — C159 Malignant neoplasm of esophagus, unspecified: Secondary | ICD-10-CM | POA: Diagnosis not present

## 2018-03-02 DIAGNOSIS — I509 Heart failure, unspecified: Secondary | ICD-10-CM | POA: Diagnosis not present

## 2018-03-02 DIAGNOSIS — I10 Essential (primary) hypertension: Secondary | ICD-10-CM | POA: Diagnosis not present

## 2018-03-03 DIAGNOSIS — C159 Malignant neoplasm of esophagus, unspecified: Secondary | ICD-10-CM | POA: Diagnosis not present

## 2018-03-03 DIAGNOSIS — I509 Heart failure, unspecified: Secondary | ICD-10-CM | POA: Diagnosis not present

## 2018-03-03 DIAGNOSIS — I519 Heart disease, unspecified: Secondary | ICD-10-CM | POA: Diagnosis not present

## 2018-03-03 DIAGNOSIS — I10 Essential (primary) hypertension: Secondary | ICD-10-CM | POA: Diagnosis not present

## 2018-03-03 DIAGNOSIS — E119 Type 2 diabetes mellitus without complications: Secondary | ICD-10-CM | POA: Diagnosis not present

## 2018-03-03 DIAGNOSIS — F250 Schizoaffective disorder, bipolar type: Secondary | ICD-10-CM | POA: Diagnosis not present

## 2018-03-04 DIAGNOSIS — I519 Heart disease, unspecified: Secondary | ICD-10-CM | POA: Diagnosis not present

## 2018-03-04 DIAGNOSIS — E119 Type 2 diabetes mellitus without complications: Secondary | ICD-10-CM | POA: Diagnosis not present

## 2018-03-04 DIAGNOSIS — I10 Essential (primary) hypertension: Secondary | ICD-10-CM | POA: Diagnosis not present

## 2018-03-04 DIAGNOSIS — I509 Heart failure, unspecified: Secondary | ICD-10-CM | POA: Diagnosis not present

## 2018-03-04 DIAGNOSIS — F250 Schizoaffective disorder, bipolar type: Secondary | ICD-10-CM | POA: Diagnosis not present

## 2018-03-04 DIAGNOSIS — C159 Malignant neoplasm of esophagus, unspecified: Secondary | ICD-10-CM | POA: Diagnosis not present

## 2018-03-07 DIAGNOSIS — E119 Type 2 diabetes mellitus without complications: Secondary | ICD-10-CM | POA: Diagnosis not present

## 2018-03-07 DIAGNOSIS — I509 Heart failure, unspecified: Secondary | ICD-10-CM | POA: Diagnosis not present

## 2018-03-07 DIAGNOSIS — C159 Malignant neoplasm of esophagus, unspecified: Secondary | ICD-10-CM | POA: Diagnosis not present

## 2018-03-07 DIAGNOSIS — I10 Essential (primary) hypertension: Secondary | ICD-10-CM | POA: Diagnosis not present

## 2018-03-07 DIAGNOSIS — F250 Schizoaffective disorder, bipolar type: Secondary | ICD-10-CM | POA: Diagnosis not present

## 2018-03-07 DIAGNOSIS — I519 Heart disease, unspecified: Secondary | ICD-10-CM | POA: Diagnosis not present

## 2018-03-08 DIAGNOSIS — F250 Schizoaffective disorder, bipolar type: Secondary | ICD-10-CM | POA: Diagnosis not present

## 2018-03-08 DIAGNOSIS — I519 Heart disease, unspecified: Secondary | ICD-10-CM | POA: Diagnosis not present

## 2018-03-08 DIAGNOSIS — I10 Essential (primary) hypertension: Secondary | ICD-10-CM | POA: Diagnosis not present

## 2018-03-08 DIAGNOSIS — C159 Malignant neoplasm of esophagus, unspecified: Secondary | ICD-10-CM | POA: Diagnosis not present

## 2018-03-08 DIAGNOSIS — E119 Type 2 diabetes mellitus without complications: Secondary | ICD-10-CM | POA: Diagnosis not present

## 2018-03-08 DIAGNOSIS — I509 Heart failure, unspecified: Secondary | ICD-10-CM | POA: Diagnosis not present

## 2018-03-09 DIAGNOSIS — I509 Heart failure, unspecified: Secondary | ICD-10-CM | POA: Diagnosis not present

## 2018-03-09 DIAGNOSIS — E119 Type 2 diabetes mellitus without complications: Secondary | ICD-10-CM | POA: Diagnosis not present

## 2018-03-09 DIAGNOSIS — I10 Essential (primary) hypertension: Secondary | ICD-10-CM | POA: Diagnosis not present

## 2018-03-09 DIAGNOSIS — I519 Heart disease, unspecified: Secondary | ICD-10-CM | POA: Diagnosis not present

## 2018-03-09 DIAGNOSIS — C159 Malignant neoplasm of esophagus, unspecified: Secondary | ICD-10-CM | POA: Diagnosis not present

## 2018-03-09 DIAGNOSIS — F250 Schizoaffective disorder, bipolar type: Secondary | ICD-10-CM | POA: Diagnosis not present

## 2018-03-10 DIAGNOSIS — F250 Schizoaffective disorder, bipolar type: Secondary | ICD-10-CM | POA: Diagnosis not present

## 2018-03-10 DIAGNOSIS — E119 Type 2 diabetes mellitus without complications: Secondary | ICD-10-CM | POA: Diagnosis not present

## 2018-03-10 DIAGNOSIS — I509 Heart failure, unspecified: Secondary | ICD-10-CM | POA: Diagnosis not present

## 2018-03-10 DIAGNOSIS — I519 Heart disease, unspecified: Secondary | ICD-10-CM | POA: Diagnosis not present

## 2018-03-10 DIAGNOSIS — I10 Essential (primary) hypertension: Secondary | ICD-10-CM | POA: Diagnosis not present

## 2018-03-10 DIAGNOSIS — C159 Malignant neoplasm of esophagus, unspecified: Secondary | ICD-10-CM | POA: Diagnosis not present

## 2018-03-11 DIAGNOSIS — I509 Heart failure, unspecified: Secondary | ICD-10-CM | POA: Diagnosis not present

## 2018-03-11 DIAGNOSIS — I519 Heart disease, unspecified: Secondary | ICD-10-CM | POA: Diagnosis not present

## 2018-03-11 DIAGNOSIS — C159 Malignant neoplasm of esophagus, unspecified: Secondary | ICD-10-CM | POA: Diagnosis not present

## 2018-03-11 DIAGNOSIS — E119 Type 2 diabetes mellitus without complications: Secondary | ICD-10-CM | POA: Diagnosis not present

## 2018-03-11 DIAGNOSIS — F250 Schizoaffective disorder, bipolar type: Secondary | ICD-10-CM | POA: Diagnosis not present

## 2018-03-11 DIAGNOSIS — I10 Essential (primary) hypertension: Secondary | ICD-10-CM | POA: Diagnosis not present

## 2018-03-14 DIAGNOSIS — F250 Schizoaffective disorder, bipolar type: Secondary | ICD-10-CM | POA: Diagnosis not present

## 2018-03-14 DIAGNOSIS — I509 Heart failure, unspecified: Secondary | ICD-10-CM | POA: Diagnosis not present

## 2018-03-14 DIAGNOSIS — C159 Malignant neoplasm of esophagus, unspecified: Secondary | ICD-10-CM | POA: Diagnosis not present

## 2018-03-14 DIAGNOSIS — E119 Type 2 diabetes mellitus without complications: Secondary | ICD-10-CM | POA: Diagnosis not present

## 2018-03-14 DIAGNOSIS — I519 Heart disease, unspecified: Secondary | ICD-10-CM | POA: Diagnosis not present

## 2018-03-14 DIAGNOSIS — I10 Essential (primary) hypertension: Secondary | ICD-10-CM | POA: Diagnosis not present

## 2018-03-16 DIAGNOSIS — F250 Schizoaffective disorder, bipolar type: Secondary | ICD-10-CM | POA: Diagnosis not present

## 2018-03-16 DIAGNOSIS — I519 Heart disease, unspecified: Secondary | ICD-10-CM | POA: Diagnosis not present

## 2018-03-16 DIAGNOSIS — C159 Malignant neoplasm of esophagus, unspecified: Secondary | ICD-10-CM | POA: Diagnosis not present

## 2018-03-16 DIAGNOSIS — E119 Type 2 diabetes mellitus without complications: Secondary | ICD-10-CM | POA: Diagnosis not present

## 2018-03-16 DIAGNOSIS — I509 Heart failure, unspecified: Secondary | ICD-10-CM | POA: Diagnosis not present

## 2018-03-16 DIAGNOSIS — I10 Essential (primary) hypertension: Secondary | ICD-10-CM | POA: Diagnosis not present

## 2018-03-18 DIAGNOSIS — I10 Essential (primary) hypertension: Secondary | ICD-10-CM | POA: Diagnosis not present

## 2018-03-18 DIAGNOSIS — I519 Heart disease, unspecified: Secondary | ICD-10-CM | POA: Diagnosis not present

## 2018-03-18 DIAGNOSIS — I509 Heart failure, unspecified: Secondary | ICD-10-CM | POA: Diagnosis not present

## 2018-03-18 DIAGNOSIS — F250 Schizoaffective disorder, bipolar type: Secondary | ICD-10-CM | POA: Diagnosis not present

## 2018-03-18 DIAGNOSIS — E119 Type 2 diabetes mellitus without complications: Secondary | ICD-10-CM | POA: Diagnosis not present

## 2018-03-18 DIAGNOSIS — C159 Malignant neoplasm of esophagus, unspecified: Secondary | ICD-10-CM | POA: Diagnosis not present

## 2018-03-19 DIAGNOSIS — N183 Chronic kidney disease, stage 3 (moderate): Secondary | ICD-10-CM | POA: Diagnosis not present

## 2018-03-19 DIAGNOSIS — K219 Gastro-esophageal reflux disease without esophagitis: Secondary | ICD-10-CM | POA: Diagnosis not present

## 2018-03-19 DIAGNOSIS — C159 Malignant neoplasm of esophagus, unspecified: Secondary | ICD-10-CM | POA: Diagnosis not present

## 2018-03-19 DIAGNOSIS — I10 Essential (primary) hypertension: Secondary | ICD-10-CM | POA: Diagnosis not present

## 2018-03-19 DIAGNOSIS — E119 Type 2 diabetes mellitus without complications: Secondary | ICD-10-CM | POA: Diagnosis not present

## 2018-03-19 DIAGNOSIS — R531 Weakness: Secondary | ICD-10-CM | POA: Diagnosis not present

## 2018-03-19 DIAGNOSIS — F250 Schizoaffective disorder, bipolar type: Secondary | ICD-10-CM | POA: Diagnosis not present

## 2018-03-19 DIAGNOSIS — I509 Heart failure, unspecified: Secondary | ICD-10-CM | POA: Diagnosis not present

## 2018-03-19 DIAGNOSIS — I519 Heart disease, unspecified: Secondary | ICD-10-CM | POA: Diagnosis not present

## 2018-03-21 DIAGNOSIS — I519 Heart disease, unspecified: Secondary | ICD-10-CM | POA: Diagnosis not present

## 2018-03-21 DIAGNOSIS — C159 Malignant neoplasm of esophagus, unspecified: Secondary | ICD-10-CM | POA: Diagnosis not present

## 2018-03-21 DIAGNOSIS — I10 Essential (primary) hypertension: Secondary | ICD-10-CM | POA: Diagnosis not present

## 2018-03-21 DIAGNOSIS — F250 Schizoaffective disorder, bipolar type: Secondary | ICD-10-CM | POA: Diagnosis not present

## 2018-03-21 DIAGNOSIS — E119 Type 2 diabetes mellitus without complications: Secondary | ICD-10-CM | POA: Diagnosis not present

## 2018-03-21 DIAGNOSIS — I509 Heart failure, unspecified: Secondary | ICD-10-CM | POA: Diagnosis not present

## 2018-03-23 DIAGNOSIS — I519 Heart disease, unspecified: Secondary | ICD-10-CM | POA: Diagnosis not present

## 2018-03-23 DIAGNOSIS — I10 Essential (primary) hypertension: Secondary | ICD-10-CM | POA: Diagnosis not present

## 2018-03-23 DIAGNOSIS — F250 Schizoaffective disorder, bipolar type: Secondary | ICD-10-CM | POA: Diagnosis not present

## 2018-03-23 DIAGNOSIS — I509 Heart failure, unspecified: Secondary | ICD-10-CM | POA: Diagnosis not present

## 2018-03-23 DIAGNOSIS — E119 Type 2 diabetes mellitus without complications: Secondary | ICD-10-CM | POA: Diagnosis not present

## 2018-03-23 DIAGNOSIS — C159 Malignant neoplasm of esophagus, unspecified: Secondary | ICD-10-CM | POA: Diagnosis not present

## 2018-03-24 DIAGNOSIS — F250 Schizoaffective disorder, bipolar type: Secondary | ICD-10-CM | POA: Diagnosis not present

## 2018-03-24 DIAGNOSIS — E119 Type 2 diabetes mellitus without complications: Secondary | ICD-10-CM | POA: Diagnosis not present

## 2018-03-24 DIAGNOSIS — C159 Malignant neoplasm of esophagus, unspecified: Secondary | ICD-10-CM | POA: Diagnosis not present

## 2018-03-24 DIAGNOSIS — I509 Heart failure, unspecified: Secondary | ICD-10-CM | POA: Diagnosis not present

## 2018-03-24 DIAGNOSIS — I519 Heart disease, unspecified: Secondary | ICD-10-CM | POA: Diagnosis not present

## 2018-03-24 DIAGNOSIS — I10 Essential (primary) hypertension: Secondary | ICD-10-CM | POA: Diagnosis not present

## 2018-03-25 DIAGNOSIS — E119 Type 2 diabetes mellitus without complications: Secondary | ICD-10-CM | POA: Diagnosis not present

## 2018-03-25 DIAGNOSIS — F250 Schizoaffective disorder, bipolar type: Secondary | ICD-10-CM | POA: Diagnosis not present

## 2018-03-25 DIAGNOSIS — C159 Malignant neoplasm of esophagus, unspecified: Secondary | ICD-10-CM | POA: Diagnosis not present

## 2018-03-25 DIAGNOSIS — I519 Heart disease, unspecified: Secondary | ICD-10-CM | POA: Diagnosis not present

## 2018-03-25 DIAGNOSIS — I10 Essential (primary) hypertension: Secondary | ICD-10-CM | POA: Diagnosis not present

## 2018-03-25 DIAGNOSIS — I509 Heart failure, unspecified: Secondary | ICD-10-CM | POA: Diagnosis not present

## 2018-03-28 DIAGNOSIS — I509 Heart failure, unspecified: Secondary | ICD-10-CM | POA: Diagnosis not present

## 2018-03-28 DIAGNOSIS — F250 Schizoaffective disorder, bipolar type: Secondary | ICD-10-CM | POA: Diagnosis not present

## 2018-03-28 DIAGNOSIS — I519 Heart disease, unspecified: Secondary | ICD-10-CM | POA: Diagnosis not present

## 2018-03-28 DIAGNOSIS — E119 Type 2 diabetes mellitus without complications: Secondary | ICD-10-CM | POA: Diagnosis not present

## 2018-03-28 DIAGNOSIS — I10 Essential (primary) hypertension: Secondary | ICD-10-CM | POA: Diagnosis not present

## 2018-03-28 DIAGNOSIS — C159 Malignant neoplasm of esophagus, unspecified: Secondary | ICD-10-CM | POA: Diagnosis not present

## 2018-03-29 DIAGNOSIS — C159 Malignant neoplasm of esophagus, unspecified: Secondary | ICD-10-CM | POA: Diagnosis not present

## 2018-03-29 DIAGNOSIS — I509 Heart failure, unspecified: Secondary | ICD-10-CM | POA: Diagnosis not present

## 2018-03-29 DIAGNOSIS — I10 Essential (primary) hypertension: Secondary | ICD-10-CM | POA: Diagnosis not present

## 2018-03-29 DIAGNOSIS — E119 Type 2 diabetes mellitus without complications: Secondary | ICD-10-CM | POA: Diagnosis not present

## 2018-03-29 DIAGNOSIS — F250 Schizoaffective disorder, bipolar type: Secondary | ICD-10-CM | POA: Diagnosis not present

## 2018-03-29 DIAGNOSIS — I519 Heart disease, unspecified: Secondary | ICD-10-CM | POA: Diagnosis not present

## 2018-03-30 DIAGNOSIS — F250 Schizoaffective disorder, bipolar type: Secondary | ICD-10-CM | POA: Diagnosis not present

## 2018-03-30 DIAGNOSIS — E119 Type 2 diabetes mellitus without complications: Secondary | ICD-10-CM | POA: Diagnosis not present

## 2018-03-30 DIAGNOSIS — I10 Essential (primary) hypertension: Secondary | ICD-10-CM | POA: Diagnosis not present

## 2018-03-30 DIAGNOSIS — I509 Heart failure, unspecified: Secondary | ICD-10-CM | POA: Diagnosis not present

## 2018-03-30 DIAGNOSIS — I519 Heart disease, unspecified: Secondary | ICD-10-CM | POA: Diagnosis not present

## 2018-03-30 DIAGNOSIS — I2510 Atherosclerotic heart disease of native coronary artery without angina pectoris: Secondary | ICD-10-CM | POA: Diagnosis not present

## 2018-03-30 DIAGNOSIS — C159 Malignant neoplasm of esophagus, unspecified: Secondary | ICD-10-CM | POA: Diagnosis not present

## 2018-03-31 DIAGNOSIS — I519 Heart disease, unspecified: Secondary | ICD-10-CM | POA: Diagnosis not present

## 2018-03-31 DIAGNOSIS — I10 Essential (primary) hypertension: Secondary | ICD-10-CM | POA: Diagnosis not present

## 2018-03-31 DIAGNOSIS — F250 Schizoaffective disorder, bipolar type: Secondary | ICD-10-CM | POA: Diagnosis not present

## 2018-03-31 DIAGNOSIS — E119 Type 2 diabetes mellitus without complications: Secondary | ICD-10-CM | POA: Diagnosis not present

## 2018-03-31 DIAGNOSIS — I509 Heart failure, unspecified: Secondary | ICD-10-CM | POA: Diagnosis not present

## 2018-03-31 DIAGNOSIS — C159 Malignant neoplasm of esophagus, unspecified: Secondary | ICD-10-CM | POA: Diagnosis not present

## 2018-04-04 DIAGNOSIS — I10 Essential (primary) hypertension: Secondary | ICD-10-CM | POA: Diagnosis not present

## 2018-04-04 DIAGNOSIS — I519 Heart disease, unspecified: Secondary | ICD-10-CM | POA: Diagnosis not present

## 2018-04-04 DIAGNOSIS — E119 Type 2 diabetes mellitus without complications: Secondary | ICD-10-CM | POA: Diagnosis not present

## 2018-04-04 DIAGNOSIS — C159 Malignant neoplasm of esophagus, unspecified: Secondary | ICD-10-CM | POA: Diagnosis not present

## 2018-04-04 DIAGNOSIS — I509 Heart failure, unspecified: Secondary | ICD-10-CM | POA: Diagnosis not present

## 2018-04-04 DIAGNOSIS — F250 Schizoaffective disorder, bipolar type: Secondary | ICD-10-CM | POA: Diagnosis not present

## 2018-04-06 DIAGNOSIS — E119 Type 2 diabetes mellitus without complications: Secondary | ICD-10-CM | POA: Diagnosis not present

## 2018-04-06 DIAGNOSIS — I10 Essential (primary) hypertension: Secondary | ICD-10-CM | POA: Diagnosis not present

## 2018-04-06 DIAGNOSIS — F250 Schizoaffective disorder, bipolar type: Secondary | ICD-10-CM | POA: Diagnosis not present

## 2018-04-06 DIAGNOSIS — C7931 Secondary malignant neoplasm of brain: Secondary | ICD-10-CM | POA: Diagnosis not present

## 2018-04-06 DIAGNOSIS — I519 Heart disease, unspecified: Secondary | ICD-10-CM | POA: Diagnosis not present

## 2018-04-06 DIAGNOSIS — I2510 Atherosclerotic heart disease of native coronary artery without angina pectoris: Secondary | ICD-10-CM | POA: Diagnosis not present

## 2018-04-06 DIAGNOSIS — I509 Heart failure, unspecified: Secondary | ICD-10-CM | POA: Diagnosis not present

## 2018-04-06 DIAGNOSIS — C159 Malignant neoplasm of esophagus, unspecified: Secondary | ICD-10-CM | POA: Diagnosis not present

## 2018-04-08 DIAGNOSIS — C159 Malignant neoplasm of esophagus, unspecified: Secondary | ICD-10-CM | POA: Diagnosis not present

## 2018-04-08 DIAGNOSIS — I519 Heart disease, unspecified: Secondary | ICD-10-CM | POA: Diagnosis not present

## 2018-04-08 DIAGNOSIS — I509 Heart failure, unspecified: Secondary | ICD-10-CM | POA: Diagnosis not present

## 2018-04-08 DIAGNOSIS — I10 Essential (primary) hypertension: Secondary | ICD-10-CM | POA: Diagnosis not present

## 2018-04-08 DIAGNOSIS — F250 Schizoaffective disorder, bipolar type: Secondary | ICD-10-CM | POA: Diagnosis not present

## 2018-04-08 DIAGNOSIS — E119 Type 2 diabetes mellitus without complications: Secondary | ICD-10-CM | POA: Diagnosis not present

## 2018-04-11 DIAGNOSIS — I519 Heart disease, unspecified: Secondary | ICD-10-CM | POA: Diagnosis not present

## 2018-04-11 DIAGNOSIS — I10 Essential (primary) hypertension: Secondary | ICD-10-CM | POA: Diagnosis not present

## 2018-04-11 DIAGNOSIS — C159 Malignant neoplasm of esophagus, unspecified: Secondary | ICD-10-CM | POA: Diagnosis not present

## 2018-04-11 DIAGNOSIS — F250 Schizoaffective disorder, bipolar type: Secondary | ICD-10-CM | POA: Diagnosis not present

## 2018-04-11 DIAGNOSIS — E119 Type 2 diabetes mellitus without complications: Secondary | ICD-10-CM | POA: Diagnosis not present

## 2018-04-11 DIAGNOSIS — I509 Heart failure, unspecified: Secondary | ICD-10-CM | POA: Diagnosis not present

## 2018-04-13 DIAGNOSIS — I509 Heart failure, unspecified: Secondary | ICD-10-CM | POA: Diagnosis not present

## 2018-04-13 DIAGNOSIS — I10 Essential (primary) hypertension: Secondary | ICD-10-CM | POA: Diagnosis not present

## 2018-04-13 DIAGNOSIS — E119 Type 2 diabetes mellitus without complications: Secondary | ICD-10-CM | POA: Diagnosis not present

## 2018-04-13 DIAGNOSIS — I519 Heart disease, unspecified: Secondary | ICD-10-CM | POA: Diagnosis not present

## 2018-04-13 DIAGNOSIS — C159 Malignant neoplasm of esophagus, unspecified: Secondary | ICD-10-CM | POA: Diagnosis not present

## 2018-04-13 DIAGNOSIS — F250 Schizoaffective disorder, bipolar type: Secondary | ICD-10-CM | POA: Diagnosis not present

## 2018-04-15 DIAGNOSIS — I10 Essential (primary) hypertension: Secondary | ICD-10-CM | POA: Diagnosis not present

## 2018-04-15 DIAGNOSIS — I509 Heart failure, unspecified: Secondary | ICD-10-CM | POA: Diagnosis not present

## 2018-04-15 DIAGNOSIS — I519 Heart disease, unspecified: Secondary | ICD-10-CM | POA: Diagnosis not present

## 2018-04-15 DIAGNOSIS — F250 Schizoaffective disorder, bipolar type: Secondary | ICD-10-CM | POA: Diagnosis not present

## 2018-04-15 DIAGNOSIS — C159 Malignant neoplasm of esophagus, unspecified: Secondary | ICD-10-CM | POA: Diagnosis not present

## 2018-04-15 DIAGNOSIS — E119 Type 2 diabetes mellitus without complications: Secondary | ICD-10-CM | POA: Diagnosis not present

## 2018-04-17 DIAGNOSIS — K219 Gastro-esophageal reflux disease without esophagitis: Secondary | ICD-10-CM | POA: Diagnosis not present

## 2018-04-17 DIAGNOSIS — N183 Chronic kidney disease, stage 3 (moderate): Secondary | ICD-10-CM | POA: Diagnosis not present

## 2018-04-17 DIAGNOSIS — I519 Heart disease, unspecified: Secondary | ICD-10-CM | POA: Diagnosis not present

## 2018-04-17 DIAGNOSIS — E119 Type 2 diabetes mellitus without complications: Secondary | ICD-10-CM | POA: Diagnosis not present

## 2018-04-17 DIAGNOSIS — R531 Weakness: Secondary | ICD-10-CM | POA: Diagnosis not present

## 2018-04-17 DIAGNOSIS — C159 Malignant neoplasm of esophagus, unspecified: Secondary | ICD-10-CM | POA: Diagnosis not present

## 2018-04-17 DIAGNOSIS — I509 Heart failure, unspecified: Secondary | ICD-10-CM | POA: Diagnosis not present

## 2018-04-17 DIAGNOSIS — F250 Schizoaffective disorder, bipolar type: Secondary | ICD-10-CM | POA: Diagnosis not present

## 2018-04-17 DIAGNOSIS — I10 Essential (primary) hypertension: Secondary | ICD-10-CM | POA: Diagnosis not present

## 2018-04-18 DIAGNOSIS — C159 Malignant neoplasm of esophagus, unspecified: Secondary | ICD-10-CM | POA: Diagnosis not present

## 2018-04-18 DIAGNOSIS — I10 Essential (primary) hypertension: Secondary | ICD-10-CM | POA: Diagnosis not present

## 2018-04-18 DIAGNOSIS — I519 Heart disease, unspecified: Secondary | ICD-10-CM | POA: Diagnosis not present

## 2018-04-18 DIAGNOSIS — I509 Heart failure, unspecified: Secondary | ICD-10-CM | POA: Diagnosis not present

## 2018-04-18 DIAGNOSIS — F250 Schizoaffective disorder, bipolar type: Secondary | ICD-10-CM | POA: Diagnosis not present

## 2018-04-18 DIAGNOSIS — E119 Type 2 diabetes mellitus without complications: Secondary | ICD-10-CM | POA: Diagnosis not present

## 2018-04-19 DIAGNOSIS — I519 Heart disease, unspecified: Secondary | ICD-10-CM | POA: Diagnosis not present

## 2018-04-19 DIAGNOSIS — C159 Malignant neoplasm of esophagus, unspecified: Secondary | ICD-10-CM | POA: Diagnosis not present

## 2018-04-19 DIAGNOSIS — E119 Type 2 diabetes mellitus without complications: Secondary | ICD-10-CM | POA: Diagnosis not present

## 2018-04-19 DIAGNOSIS — F250 Schizoaffective disorder, bipolar type: Secondary | ICD-10-CM | POA: Diagnosis not present

## 2018-04-19 DIAGNOSIS — I509 Heart failure, unspecified: Secondary | ICD-10-CM | POA: Diagnosis not present

## 2018-04-19 DIAGNOSIS — I10 Essential (primary) hypertension: Secondary | ICD-10-CM | POA: Diagnosis not present

## 2018-04-20 DIAGNOSIS — F250 Schizoaffective disorder, bipolar type: Secondary | ICD-10-CM | POA: Diagnosis not present

## 2018-04-20 DIAGNOSIS — E119 Type 2 diabetes mellitus without complications: Secondary | ICD-10-CM | POA: Diagnosis not present

## 2018-04-20 DIAGNOSIS — C159 Malignant neoplasm of esophagus, unspecified: Secondary | ICD-10-CM | POA: Diagnosis not present

## 2018-04-20 DIAGNOSIS — I509 Heart failure, unspecified: Secondary | ICD-10-CM | POA: Diagnosis not present

## 2018-04-20 DIAGNOSIS — I10 Essential (primary) hypertension: Secondary | ICD-10-CM | POA: Diagnosis not present

## 2018-04-20 DIAGNOSIS — I519 Heart disease, unspecified: Secondary | ICD-10-CM | POA: Diagnosis not present

## 2018-04-21 DIAGNOSIS — I519 Heart disease, unspecified: Secondary | ICD-10-CM | POA: Diagnosis not present

## 2018-04-21 DIAGNOSIS — I509 Heart failure, unspecified: Secondary | ICD-10-CM | POA: Diagnosis not present

## 2018-04-21 DIAGNOSIS — F250 Schizoaffective disorder, bipolar type: Secondary | ICD-10-CM | POA: Diagnosis not present

## 2018-04-21 DIAGNOSIS — F3110 Bipolar disorder, current episode manic without psychotic features, unspecified: Secondary | ICD-10-CM | POA: Diagnosis not present

## 2018-04-21 DIAGNOSIS — E119 Type 2 diabetes mellitus without complications: Secondary | ICD-10-CM | POA: Diagnosis not present

## 2018-04-21 DIAGNOSIS — C159 Malignant neoplasm of esophagus, unspecified: Secondary | ICD-10-CM | POA: Diagnosis not present

## 2018-04-21 DIAGNOSIS — I10 Essential (primary) hypertension: Secondary | ICD-10-CM | POA: Diagnosis not present

## 2018-04-21 DIAGNOSIS — I5033 Acute on chronic diastolic (congestive) heart failure: Secondary | ICD-10-CM | POA: Diagnosis not present

## 2018-04-25 DIAGNOSIS — C159 Malignant neoplasm of esophagus, unspecified: Secondary | ICD-10-CM | POA: Diagnosis not present

## 2018-04-25 DIAGNOSIS — I519 Heart disease, unspecified: Secondary | ICD-10-CM | POA: Diagnosis not present

## 2018-04-25 DIAGNOSIS — I509 Heart failure, unspecified: Secondary | ICD-10-CM | POA: Diagnosis not present

## 2018-04-25 DIAGNOSIS — E119 Type 2 diabetes mellitus without complications: Secondary | ICD-10-CM | POA: Diagnosis not present

## 2018-04-25 DIAGNOSIS — F250 Schizoaffective disorder, bipolar type: Secondary | ICD-10-CM | POA: Diagnosis not present

## 2018-04-25 DIAGNOSIS — I10 Essential (primary) hypertension: Secondary | ICD-10-CM | POA: Diagnosis not present

## 2018-04-27 DIAGNOSIS — I519 Heart disease, unspecified: Secondary | ICD-10-CM | POA: Diagnosis not present

## 2018-04-27 DIAGNOSIS — I10 Essential (primary) hypertension: Secondary | ICD-10-CM | POA: Diagnosis not present

## 2018-04-27 DIAGNOSIS — I509 Heart failure, unspecified: Secondary | ICD-10-CM | POA: Diagnosis not present

## 2018-04-27 DIAGNOSIS — F250 Schizoaffective disorder, bipolar type: Secondary | ICD-10-CM | POA: Diagnosis not present

## 2018-04-27 DIAGNOSIS — K5900 Constipation, unspecified: Secondary | ICD-10-CM | POA: Diagnosis not present

## 2018-04-27 DIAGNOSIS — E119 Type 2 diabetes mellitus without complications: Secondary | ICD-10-CM | POA: Diagnosis not present

## 2018-04-27 DIAGNOSIS — C159 Malignant neoplasm of esophagus, unspecified: Secondary | ICD-10-CM | POA: Diagnosis not present

## 2018-04-27 DIAGNOSIS — I5033 Acute on chronic diastolic (congestive) heart failure: Secondary | ICD-10-CM | POA: Diagnosis not present

## 2018-04-28 DIAGNOSIS — E119 Type 2 diabetes mellitus without complications: Secondary | ICD-10-CM | POA: Diagnosis not present

## 2018-04-28 DIAGNOSIS — I10 Essential (primary) hypertension: Secondary | ICD-10-CM | POA: Diagnosis not present

## 2018-04-28 DIAGNOSIS — F250 Schizoaffective disorder, bipolar type: Secondary | ICD-10-CM | POA: Diagnosis not present

## 2018-04-28 DIAGNOSIS — I509 Heart failure, unspecified: Secondary | ICD-10-CM | POA: Diagnosis not present

## 2018-04-28 DIAGNOSIS — C159 Malignant neoplasm of esophagus, unspecified: Secondary | ICD-10-CM | POA: Diagnosis not present

## 2018-04-28 DIAGNOSIS — I519 Heart disease, unspecified: Secondary | ICD-10-CM | POA: Diagnosis not present

## 2018-05-02 DIAGNOSIS — I509 Heart failure, unspecified: Secondary | ICD-10-CM | POA: Diagnosis not present

## 2018-05-02 DIAGNOSIS — I519 Heart disease, unspecified: Secondary | ICD-10-CM | POA: Diagnosis not present

## 2018-05-02 DIAGNOSIS — C159 Malignant neoplasm of esophagus, unspecified: Secondary | ICD-10-CM | POA: Diagnosis not present

## 2018-05-02 DIAGNOSIS — F250 Schizoaffective disorder, bipolar type: Secondary | ICD-10-CM | POA: Diagnosis not present

## 2018-05-02 DIAGNOSIS — I10 Essential (primary) hypertension: Secondary | ICD-10-CM | POA: Diagnosis not present

## 2018-05-02 DIAGNOSIS — E119 Type 2 diabetes mellitus without complications: Secondary | ICD-10-CM | POA: Diagnosis not present

## 2018-05-03 DIAGNOSIS — C159 Malignant neoplasm of esophagus, unspecified: Secondary | ICD-10-CM | POA: Diagnosis not present

## 2018-05-03 DIAGNOSIS — I509 Heart failure, unspecified: Secondary | ICD-10-CM | POA: Diagnosis not present

## 2018-05-03 DIAGNOSIS — E119 Type 2 diabetes mellitus without complications: Secondary | ICD-10-CM | POA: Diagnosis not present

## 2018-05-03 DIAGNOSIS — F250 Schizoaffective disorder, bipolar type: Secondary | ICD-10-CM | POA: Diagnosis not present

## 2018-05-03 DIAGNOSIS — I10 Essential (primary) hypertension: Secondary | ICD-10-CM | POA: Diagnosis not present

## 2018-05-03 DIAGNOSIS — I519 Heart disease, unspecified: Secondary | ICD-10-CM | POA: Diagnosis not present

## 2018-05-06 DIAGNOSIS — F250 Schizoaffective disorder, bipolar type: Secondary | ICD-10-CM | POA: Diagnosis not present

## 2018-05-06 DIAGNOSIS — I509 Heart failure, unspecified: Secondary | ICD-10-CM | POA: Diagnosis not present

## 2018-05-06 DIAGNOSIS — I10 Essential (primary) hypertension: Secondary | ICD-10-CM | POA: Diagnosis not present

## 2018-05-06 DIAGNOSIS — C159 Malignant neoplasm of esophagus, unspecified: Secondary | ICD-10-CM | POA: Diagnosis not present

## 2018-05-06 DIAGNOSIS — E119 Type 2 diabetes mellitus without complications: Secondary | ICD-10-CM | POA: Diagnosis not present

## 2018-05-06 DIAGNOSIS — I519 Heart disease, unspecified: Secondary | ICD-10-CM | POA: Diagnosis not present

## 2018-05-09 DIAGNOSIS — F250 Schizoaffective disorder, bipolar type: Secondary | ICD-10-CM | POA: Diagnosis not present

## 2018-05-09 DIAGNOSIS — C159 Malignant neoplasm of esophagus, unspecified: Secondary | ICD-10-CM | POA: Diagnosis not present

## 2018-05-09 DIAGNOSIS — I509 Heart failure, unspecified: Secondary | ICD-10-CM | POA: Diagnosis not present

## 2018-05-09 DIAGNOSIS — I10 Essential (primary) hypertension: Secondary | ICD-10-CM | POA: Diagnosis not present

## 2018-05-09 DIAGNOSIS — I519 Heart disease, unspecified: Secondary | ICD-10-CM | POA: Diagnosis not present

## 2018-05-09 DIAGNOSIS — E119 Type 2 diabetes mellitus without complications: Secondary | ICD-10-CM | POA: Diagnosis not present

## 2018-05-13 DIAGNOSIS — I10 Essential (primary) hypertension: Secondary | ICD-10-CM | POA: Diagnosis not present

## 2018-05-13 DIAGNOSIS — C159 Malignant neoplasm of esophagus, unspecified: Secondary | ICD-10-CM | POA: Diagnosis not present

## 2018-05-13 DIAGNOSIS — F250 Schizoaffective disorder, bipolar type: Secondary | ICD-10-CM | POA: Diagnosis not present

## 2018-05-13 DIAGNOSIS — E119 Type 2 diabetes mellitus without complications: Secondary | ICD-10-CM | POA: Diagnosis not present

## 2018-05-13 DIAGNOSIS — I519 Heart disease, unspecified: Secondary | ICD-10-CM | POA: Diagnosis not present

## 2018-05-13 DIAGNOSIS — I509 Heart failure, unspecified: Secondary | ICD-10-CM | POA: Diagnosis not present

## 2018-05-16 DIAGNOSIS — C159 Malignant neoplasm of esophagus, unspecified: Secondary | ICD-10-CM | POA: Diagnosis not present

## 2018-05-16 DIAGNOSIS — F250 Schizoaffective disorder, bipolar type: Secondary | ICD-10-CM | POA: Diagnosis not present

## 2018-05-16 DIAGNOSIS — I10 Essential (primary) hypertension: Secondary | ICD-10-CM | POA: Diagnosis not present

## 2018-05-16 DIAGNOSIS — E119 Type 2 diabetes mellitus without complications: Secondary | ICD-10-CM | POA: Diagnosis not present

## 2018-05-16 DIAGNOSIS — I519 Heart disease, unspecified: Secondary | ICD-10-CM | POA: Diagnosis not present

## 2018-05-16 DIAGNOSIS — I509 Heart failure, unspecified: Secondary | ICD-10-CM | POA: Diagnosis not present

## 2018-05-17 DIAGNOSIS — C159 Malignant neoplasm of esophagus, unspecified: Secondary | ICD-10-CM | POA: Diagnosis not present

## 2018-05-17 DIAGNOSIS — E119 Type 2 diabetes mellitus without complications: Secondary | ICD-10-CM | POA: Diagnosis not present

## 2018-05-17 DIAGNOSIS — I519 Heart disease, unspecified: Secondary | ICD-10-CM | POA: Diagnosis not present

## 2018-05-17 DIAGNOSIS — I509 Heart failure, unspecified: Secondary | ICD-10-CM | POA: Diagnosis not present

## 2018-05-17 DIAGNOSIS — F250 Schizoaffective disorder, bipolar type: Secondary | ICD-10-CM | POA: Diagnosis not present

## 2018-05-17 DIAGNOSIS — I10 Essential (primary) hypertension: Secondary | ICD-10-CM | POA: Diagnosis not present

## 2018-05-18 DIAGNOSIS — R531 Weakness: Secondary | ICD-10-CM | POA: Diagnosis not present

## 2018-05-18 DIAGNOSIS — I10 Essential (primary) hypertension: Secondary | ICD-10-CM | POA: Diagnosis not present

## 2018-05-18 DIAGNOSIS — N183 Chronic kidney disease, stage 3 (moderate): Secondary | ICD-10-CM | POA: Diagnosis not present

## 2018-05-18 DIAGNOSIS — I519 Heart disease, unspecified: Secondary | ICD-10-CM | POA: Diagnosis not present

## 2018-05-18 DIAGNOSIS — C159 Malignant neoplasm of esophagus, unspecified: Secondary | ICD-10-CM | POA: Diagnosis not present

## 2018-05-18 DIAGNOSIS — F250 Schizoaffective disorder, bipolar type: Secondary | ICD-10-CM | POA: Diagnosis not present

## 2018-05-18 DIAGNOSIS — I509 Heart failure, unspecified: Secondary | ICD-10-CM | POA: Diagnosis not present

## 2018-05-18 DIAGNOSIS — K219 Gastro-esophageal reflux disease without esophagitis: Secondary | ICD-10-CM | POA: Diagnosis not present

## 2018-05-18 DIAGNOSIS — E119 Type 2 diabetes mellitus without complications: Secondary | ICD-10-CM | POA: Diagnosis not present

## 2018-05-19 DIAGNOSIS — C159 Malignant neoplasm of esophagus, unspecified: Secondary | ICD-10-CM | POA: Diagnosis not present

## 2018-05-19 DIAGNOSIS — I10 Essential (primary) hypertension: Secondary | ICD-10-CM | POA: Diagnosis not present

## 2018-05-19 DIAGNOSIS — F250 Schizoaffective disorder, bipolar type: Secondary | ICD-10-CM | POA: Diagnosis not present

## 2018-05-19 DIAGNOSIS — E119 Type 2 diabetes mellitus without complications: Secondary | ICD-10-CM | POA: Diagnosis not present

## 2018-05-19 DIAGNOSIS — I509 Heart failure, unspecified: Secondary | ICD-10-CM | POA: Diagnosis not present

## 2018-05-19 DIAGNOSIS — I519 Heart disease, unspecified: Secondary | ICD-10-CM | POA: Diagnosis not present

## 2018-05-23 DIAGNOSIS — I519 Heart disease, unspecified: Secondary | ICD-10-CM | POA: Diagnosis not present

## 2018-05-23 DIAGNOSIS — E119 Type 2 diabetes mellitus without complications: Secondary | ICD-10-CM | POA: Diagnosis not present

## 2018-05-23 DIAGNOSIS — I10 Essential (primary) hypertension: Secondary | ICD-10-CM | POA: Diagnosis not present

## 2018-05-23 DIAGNOSIS — F250 Schizoaffective disorder, bipolar type: Secondary | ICD-10-CM | POA: Diagnosis not present

## 2018-05-23 DIAGNOSIS — C159 Malignant neoplasm of esophagus, unspecified: Secondary | ICD-10-CM | POA: Diagnosis not present

## 2018-05-23 DIAGNOSIS — I509 Heart failure, unspecified: Secondary | ICD-10-CM | POA: Diagnosis not present

## 2018-05-25 DIAGNOSIS — I10 Essential (primary) hypertension: Secondary | ICD-10-CM | POA: Diagnosis not present

## 2018-05-25 DIAGNOSIS — E119 Type 2 diabetes mellitus without complications: Secondary | ICD-10-CM | POA: Diagnosis not present

## 2018-05-25 DIAGNOSIS — C159 Malignant neoplasm of esophagus, unspecified: Secondary | ICD-10-CM | POA: Diagnosis not present

## 2018-05-25 DIAGNOSIS — I5033 Acute on chronic diastolic (congestive) heart failure: Secondary | ICD-10-CM | POA: Diagnosis not present

## 2018-05-27 DIAGNOSIS — F250 Schizoaffective disorder, bipolar type: Secondary | ICD-10-CM | POA: Diagnosis not present

## 2018-05-27 DIAGNOSIS — E119 Type 2 diabetes mellitus without complications: Secondary | ICD-10-CM | POA: Diagnosis not present

## 2018-05-27 DIAGNOSIS — I519 Heart disease, unspecified: Secondary | ICD-10-CM | POA: Diagnosis not present

## 2018-05-27 DIAGNOSIS — I10 Essential (primary) hypertension: Secondary | ICD-10-CM | POA: Diagnosis not present

## 2018-05-27 DIAGNOSIS — I509 Heart failure, unspecified: Secondary | ICD-10-CM | POA: Diagnosis not present

## 2018-05-27 DIAGNOSIS — C159 Malignant neoplasm of esophagus, unspecified: Secondary | ICD-10-CM | POA: Diagnosis not present

## 2018-05-30 DIAGNOSIS — C159 Malignant neoplasm of esophagus, unspecified: Secondary | ICD-10-CM | POA: Diagnosis not present

## 2018-05-30 DIAGNOSIS — I519 Heart disease, unspecified: Secondary | ICD-10-CM | POA: Diagnosis not present

## 2018-05-30 DIAGNOSIS — I509 Heart failure, unspecified: Secondary | ICD-10-CM | POA: Diagnosis not present

## 2018-05-30 DIAGNOSIS — F250 Schizoaffective disorder, bipolar type: Secondary | ICD-10-CM | POA: Diagnosis not present

## 2018-05-30 DIAGNOSIS — E119 Type 2 diabetes mellitus without complications: Secondary | ICD-10-CM | POA: Diagnosis not present

## 2018-05-30 DIAGNOSIS — I10 Essential (primary) hypertension: Secondary | ICD-10-CM | POA: Diagnosis not present

## 2018-05-31 DIAGNOSIS — F250 Schizoaffective disorder, bipolar type: Secondary | ICD-10-CM | POA: Diagnosis not present

## 2018-05-31 DIAGNOSIS — I10 Essential (primary) hypertension: Secondary | ICD-10-CM | POA: Diagnosis not present

## 2018-05-31 DIAGNOSIS — I519 Heart disease, unspecified: Secondary | ICD-10-CM | POA: Diagnosis not present

## 2018-05-31 DIAGNOSIS — C159 Malignant neoplasm of esophagus, unspecified: Secondary | ICD-10-CM | POA: Diagnosis not present

## 2018-05-31 DIAGNOSIS — I509 Heart failure, unspecified: Secondary | ICD-10-CM | POA: Diagnosis not present

## 2018-05-31 DIAGNOSIS — E119 Type 2 diabetes mellitus without complications: Secondary | ICD-10-CM | POA: Diagnosis not present

## 2018-06-03 DIAGNOSIS — C159 Malignant neoplasm of esophagus, unspecified: Secondary | ICD-10-CM | POA: Diagnosis not present

## 2018-06-03 DIAGNOSIS — I509 Heart failure, unspecified: Secondary | ICD-10-CM | POA: Diagnosis not present

## 2018-06-03 DIAGNOSIS — E119 Type 2 diabetes mellitus without complications: Secondary | ICD-10-CM | POA: Diagnosis not present

## 2018-06-03 DIAGNOSIS — F250 Schizoaffective disorder, bipolar type: Secondary | ICD-10-CM | POA: Diagnosis not present

## 2018-06-03 DIAGNOSIS — I519 Heart disease, unspecified: Secondary | ICD-10-CM | POA: Diagnosis not present

## 2018-06-03 DIAGNOSIS — I10 Essential (primary) hypertension: Secondary | ICD-10-CM | POA: Diagnosis not present

## 2018-06-06 DIAGNOSIS — F250 Schizoaffective disorder, bipolar type: Secondary | ICD-10-CM | POA: Diagnosis not present

## 2018-06-06 DIAGNOSIS — I509 Heart failure, unspecified: Secondary | ICD-10-CM | POA: Diagnosis not present

## 2018-06-06 DIAGNOSIS — C159 Malignant neoplasm of esophagus, unspecified: Secondary | ICD-10-CM | POA: Diagnosis not present

## 2018-06-06 DIAGNOSIS — I519 Heart disease, unspecified: Secondary | ICD-10-CM | POA: Diagnosis not present

## 2018-06-06 DIAGNOSIS — E119 Type 2 diabetes mellitus without complications: Secondary | ICD-10-CM | POA: Diagnosis not present

## 2018-06-06 DIAGNOSIS — I10 Essential (primary) hypertension: Secondary | ICD-10-CM | POA: Diagnosis not present

## 2018-06-10 DIAGNOSIS — I509 Heart failure, unspecified: Secondary | ICD-10-CM | POA: Diagnosis not present

## 2018-06-10 DIAGNOSIS — I10 Essential (primary) hypertension: Secondary | ICD-10-CM | POA: Diagnosis not present

## 2018-06-10 DIAGNOSIS — I519 Heart disease, unspecified: Secondary | ICD-10-CM | POA: Diagnosis not present

## 2018-06-10 DIAGNOSIS — C159 Malignant neoplasm of esophagus, unspecified: Secondary | ICD-10-CM | POA: Diagnosis not present

## 2018-06-10 DIAGNOSIS — E119 Type 2 diabetes mellitus without complications: Secondary | ICD-10-CM | POA: Diagnosis not present

## 2018-06-10 DIAGNOSIS — F250 Schizoaffective disorder, bipolar type: Secondary | ICD-10-CM | POA: Diagnosis not present

## 2018-06-13 DIAGNOSIS — I509 Heart failure, unspecified: Secondary | ICD-10-CM | POA: Diagnosis not present

## 2018-06-13 DIAGNOSIS — E119 Type 2 diabetes mellitus without complications: Secondary | ICD-10-CM | POA: Diagnosis not present

## 2018-06-13 DIAGNOSIS — C159 Malignant neoplasm of esophagus, unspecified: Secondary | ICD-10-CM | POA: Diagnosis not present

## 2018-06-13 DIAGNOSIS — I10 Essential (primary) hypertension: Secondary | ICD-10-CM | POA: Diagnosis not present

## 2018-06-13 DIAGNOSIS — F250 Schizoaffective disorder, bipolar type: Secondary | ICD-10-CM | POA: Diagnosis not present

## 2018-06-13 DIAGNOSIS — I519 Heart disease, unspecified: Secondary | ICD-10-CM | POA: Diagnosis not present

## 2018-06-14 DIAGNOSIS — F250 Schizoaffective disorder, bipolar type: Secondary | ICD-10-CM | POA: Diagnosis not present

## 2018-06-14 DIAGNOSIS — I509 Heart failure, unspecified: Secondary | ICD-10-CM | POA: Diagnosis not present

## 2018-06-14 DIAGNOSIS — I519 Heart disease, unspecified: Secondary | ICD-10-CM | POA: Diagnosis not present

## 2018-06-14 DIAGNOSIS — E119 Type 2 diabetes mellitus without complications: Secondary | ICD-10-CM | POA: Diagnosis not present

## 2018-06-14 DIAGNOSIS — C159 Malignant neoplasm of esophagus, unspecified: Secondary | ICD-10-CM | POA: Diagnosis not present

## 2018-06-14 DIAGNOSIS — I10 Essential (primary) hypertension: Secondary | ICD-10-CM | POA: Diagnosis not present

## 2018-06-15 DIAGNOSIS — I10 Essential (primary) hypertension: Secondary | ICD-10-CM | POA: Diagnosis not present

## 2018-06-15 DIAGNOSIS — C159 Malignant neoplasm of esophagus, unspecified: Secondary | ICD-10-CM | POA: Diagnosis not present

## 2018-06-15 DIAGNOSIS — E119 Type 2 diabetes mellitus without complications: Secondary | ICD-10-CM | POA: Diagnosis not present

## 2018-06-15 DIAGNOSIS — I5030 Unspecified diastolic (congestive) heart failure: Secondary | ICD-10-CM | POA: Diagnosis not present

## 2018-06-17 DIAGNOSIS — I10 Essential (primary) hypertension: Secondary | ICD-10-CM | POA: Diagnosis not present

## 2018-06-17 DIAGNOSIS — F250 Schizoaffective disorder, bipolar type: Secondary | ICD-10-CM | POA: Diagnosis not present

## 2018-06-17 DIAGNOSIS — I519 Heart disease, unspecified: Secondary | ICD-10-CM | POA: Diagnosis not present

## 2018-06-17 DIAGNOSIS — C159 Malignant neoplasm of esophagus, unspecified: Secondary | ICD-10-CM | POA: Diagnosis not present

## 2018-06-17 DIAGNOSIS — E119 Type 2 diabetes mellitus without complications: Secondary | ICD-10-CM | POA: Diagnosis not present

## 2018-06-17 DIAGNOSIS — K219 Gastro-esophageal reflux disease without esophagitis: Secondary | ICD-10-CM | POA: Diagnosis not present

## 2018-06-17 DIAGNOSIS — R531 Weakness: Secondary | ICD-10-CM | POA: Diagnosis not present

## 2018-06-17 DIAGNOSIS — I509 Heart failure, unspecified: Secondary | ICD-10-CM | POA: Diagnosis not present

## 2018-06-17 DIAGNOSIS — N183 Chronic kidney disease, stage 3 (moderate): Secondary | ICD-10-CM | POA: Diagnosis not present

## 2018-06-20 DIAGNOSIS — C159 Malignant neoplasm of esophagus, unspecified: Secondary | ICD-10-CM | POA: Diagnosis not present

## 2018-06-20 DIAGNOSIS — I509 Heart failure, unspecified: Secondary | ICD-10-CM | POA: Diagnosis not present

## 2018-06-20 DIAGNOSIS — F250 Schizoaffective disorder, bipolar type: Secondary | ICD-10-CM | POA: Diagnosis not present

## 2018-06-20 DIAGNOSIS — E119 Type 2 diabetes mellitus without complications: Secondary | ICD-10-CM | POA: Diagnosis not present

## 2018-06-20 DIAGNOSIS — I10 Essential (primary) hypertension: Secondary | ICD-10-CM | POA: Diagnosis not present

## 2018-06-20 DIAGNOSIS — I519 Heart disease, unspecified: Secondary | ICD-10-CM | POA: Diagnosis not present

## 2018-06-24 DIAGNOSIS — C159 Malignant neoplasm of esophagus, unspecified: Secondary | ICD-10-CM | POA: Diagnosis not present

## 2018-06-24 DIAGNOSIS — F250 Schizoaffective disorder, bipolar type: Secondary | ICD-10-CM | POA: Diagnosis not present

## 2018-06-24 DIAGNOSIS — I509 Heart failure, unspecified: Secondary | ICD-10-CM | POA: Diagnosis not present

## 2018-06-24 DIAGNOSIS — I10 Essential (primary) hypertension: Secondary | ICD-10-CM | POA: Diagnosis not present

## 2018-06-24 DIAGNOSIS — I519 Heart disease, unspecified: Secondary | ICD-10-CM | POA: Diagnosis not present

## 2018-06-24 DIAGNOSIS — E119 Type 2 diabetes mellitus without complications: Secondary | ICD-10-CM | POA: Diagnosis not present

## 2018-06-27 DIAGNOSIS — C159 Malignant neoplasm of esophagus, unspecified: Secondary | ICD-10-CM | POA: Diagnosis not present

## 2018-06-27 DIAGNOSIS — I10 Essential (primary) hypertension: Secondary | ICD-10-CM | POA: Diagnosis not present

## 2018-06-27 DIAGNOSIS — F250 Schizoaffective disorder, bipolar type: Secondary | ICD-10-CM | POA: Diagnosis not present

## 2018-06-27 DIAGNOSIS — I509 Heart failure, unspecified: Secondary | ICD-10-CM | POA: Diagnosis not present

## 2018-06-27 DIAGNOSIS — I519 Heart disease, unspecified: Secondary | ICD-10-CM | POA: Diagnosis not present

## 2018-06-27 DIAGNOSIS — E119 Type 2 diabetes mellitus without complications: Secondary | ICD-10-CM | POA: Diagnosis not present

## 2018-06-28 DIAGNOSIS — C159 Malignant neoplasm of esophagus, unspecified: Secondary | ICD-10-CM | POA: Diagnosis not present

## 2018-06-28 DIAGNOSIS — I10 Essential (primary) hypertension: Secondary | ICD-10-CM | POA: Diagnosis not present

## 2018-06-28 DIAGNOSIS — I519 Heart disease, unspecified: Secondary | ICD-10-CM | POA: Diagnosis not present

## 2018-06-28 DIAGNOSIS — F250 Schizoaffective disorder, bipolar type: Secondary | ICD-10-CM | POA: Diagnosis not present

## 2018-06-28 DIAGNOSIS — I509 Heart failure, unspecified: Secondary | ICD-10-CM | POA: Diagnosis not present

## 2018-06-28 DIAGNOSIS — E119 Type 2 diabetes mellitus without complications: Secondary | ICD-10-CM | POA: Diagnosis not present

## 2018-06-30 DIAGNOSIS — I519 Heart disease, unspecified: Secondary | ICD-10-CM | POA: Diagnosis not present

## 2018-06-30 DIAGNOSIS — F250 Schizoaffective disorder, bipolar type: Secondary | ICD-10-CM | POA: Diagnosis not present

## 2018-06-30 DIAGNOSIS — C159 Malignant neoplasm of esophagus, unspecified: Secondary | ICD-10-CM | POA: Diagnosis not present

## 2018-06-30 DIAGNOSIS — I509 Heart failure, unspecified: Secondary | ICD-10-CM | POA: Diagnosis not present

## 2018-06-30 DIAGNOSIS — I10 Essential (primary) hypertension: Secondary | ICD-10-CM | POA: Diagnosis not present

## 2018-06-30 DIAGNOSIS — E119 Type 2 diabetes mellitus without complications: Secondary | ICD-10-CM | POA: Diagnosis not present

## 2018-07-04 DIAGNOSIS — F250 Schizoaffective disorder, bipolar type: Secondary | ICD-10-CM | POA: Diagnosis not present

## 2018-07-04 DIAGNOSIS — E119 Type 2 diabetes mellitus without complications: Secondary | ICD-10-CM | POA: Diagnosis not present

## 2018-07-04 DIAGNOSIS — I509 Heart failure, unspecified: Secondary | ICD-10-CM | POA: Diagnosis not present

## 2018-07-04 DIAGNOSIS — I10 Essential (primary) hypertension: Secondary | ICD-10-CM | POA: Diagnosis not present

## 2018-07-04 DIAGNOSIS — I519 Heart disease, unspecified: Secondary | ICD-10-CM | POA: Diagnosis not present

## 2018-07-04 DIAGNOSIS — C159 Malignant neoplasm of esophagus, unspecified: Secondary | ICD-10-CM | POA: Diagnosis not present

## 2018-07-08 DIAGNOSIS — I10 Essential (primary) hypertension: Secondary | ICD-10-CM | POA: Diagnosis not present

## 2018-07-08 DIAGNOSIS — C159 Malignant neoplasm of esophagus, unspecified: Secondary | ICD-10-CM | POA: Diagnosis not present

## 2018-07-08 DIAGNOSIS — I509 Heart failure, unspecified: Secondary | ICD-10-CM | POA: Diagnosis not present

## 2018-07-08 DIAGNOSIS — E119 Type 2 diabetes mellitus without complications: Secondary | ICD-10-CM | POA: Diagnosis not present

## 2018-07-08 DIAGNOSIS — I519 Heart disease, unspecified: Secondary | ICD-10-CM | POA: Diagnosis not present

## 2018-07-08 DIAGNOSIS — F250 Schizoaffective disorder, bipolar type: Secondary | ICD-10-CM | POA: Diagnosis not present

## 2018-07-11 DIAGNOSIS — I519 Heart disease, unspecified: Secondary | ICD-10-CM | POA: Diagnosis not present

## 2018-07-11 DIAGNOSIS — E119 Type 2 diabetes mellitus without complications: Secondary | ICD-10-CM | POA: Diagnosis not present

## 2018-07-11 DIAGNOSIS — C159 Malignant neoplasm of esophagus, unspecified: Secondary | ICD-10-CM | POA: Diagnosis not present

## 2018-07-11 DIAGNOSIS — F250 Schizoaffective disorder, bipolar type: Secondary | ICD-10-CM | POA: Diagnosis not present

## 2018-07-11 DIAGNOSIS — I509 Heart failure, unspecified: Secondary | ICD-10-CM | POA: Diagnosis not present

## 2018-07-11 DIAGNOSIS — I10 Essential (primary) hypertension: Secondary | ICD-10-CM | POA: Diagnosis not present

## 2018-07-12 DIAGNOSIS — I509 Heart failure, unspecified: Secondary | ICD-10-CM | POA: Diagnosis not present

## 2018-07-12 DIAGNOSIS — I10 Essential (primary) hypertension: Secondary | ICD-10-CM | POA: Diagnosis not present

## 2018-07-12 DIAGNOSIS — C159 Malignant neoplasm of esophagus, unspecified: Secondary | ICD-10-CM | POA: Diagnosis not present

## 2018-07-12 DIAGNOSIS — F250 Schizoaffective disorder, bipolar type: Secondary | ICD-10-CM | POA: Diagnosis not present

## 2018-07-12 DIAGNOSIS — I519 Heart disease, unspecified: Secondary | ICD-10-CM | POA: Diagnosis not present

## 2018-07-12 DIAGNOSIS — E119 Type 2 diabetes mellitus without complications: Secondary | ICD-10-CM | POA: Diagnosis not present

## 2018-07-15 DIAGNOSIS — I509 Heart failure, unspecified: Secondary | ICD-10-CM | POA: Diagnosis not present

## 2018-07-15 DIAGNOSIS — F250 Schizoaffective disorder, bipolar type: Secondary | ICD-10-CM | POA: Diagnosis not present

## 2018-07-15 DIAGNOSIS — C159 Malignant neoplasm of esophagus, unspecified: Secondary | ICD-10-CM | POA: Diagnosis not present

## 2018-07-15 DIAGNOSIS — I519 Heart disease, unspecified: Secondary | ICD-10-CM | POA: Diagnosis not present

## 2018-07-15 DIAGNOSIS — I10 Essential (primary) hypertension: Secondary | ICD-10-CM | POA: Diagnosis not present

## 2018-07-15 DIAGNOSIS — E119 Type 2 diabetes mellitus without complications: Secondary | ICD-10-CM | POA: Diagnosis not present

## 2018-07-18 DIAGNOSIS — N183 Chronic kidney disease, stage 3 (moderate): Secondary | ICD-10-CM | POA: Diagnosis not present

## 2018-07-18 DIAGNOSIS — H04129 Dry eye syndrome of unspecified lacrimal gland: Secondary | ICD-10-CM | POA: Diagnosis not present

## 2018-07-18 DIAGNOSIS — C159 Malignant neoplasm of esophagus, unspecified: Secondary | ICD-10-CM | POA: Diagnosis not present

## 2018-07-18 DIAGNOSIS — F419 Anxiety disorder, unspecified: Secondary | ICD-10-CM | POA: Diagnosis not present

## 2018-07-18 DIAGNOSIS — I509 Heart failure, unspecified: Secondary | ICD-10-CM | POA: Diagnosis not present

## 2018-07-18 DIAGNOSIS — R52 Pain, unspecified: Secondary | ICD-10-CM | POA: Diagnosis not present

## 2018-07-18 DIAGNOSIS — I519 Heart disease, unspecified: Secondary | ICD-10-CM | POA: Diagnosis not present

## 2018-07-18 DIAGNOSIS — R531 Weakness: Secondary | ICD-10-CM | POA: Diagnosis not present

## 2018-07-18 DIAGNOSIS — R682 Dry mouth, unspecified: Secondary | ICD-10-CM | POA: Diagnosis not present

## 2018-07-18 DIAGNOSIS — E119 Type 2 diabetes mellitus without complications: Secondary | ICD-10-CM | POA: Diagnosis not present

## 2018-07-18 DIAGNOSIS — F250 Schizoaffective disorder, bipolar type: Secondary | ICD-10-CM | POA: Diagnosis not present

## 2018-07-18 DIAGNOSIS — K219 Gastro-esophageal reflux disease without esophagitis: Secondary | ICD-10-CM | POA: Diagnosis not present

## 2018-07-18 DIAGNOSIS — I10 Essential (primary) hypertension: Secondary | ICD-10-CM | POA: Diagnosis not present

## 2018-07-20 DIAGNOSIS — I5030 Unspecified diastolic (congestive) heart failure: Secondary | ICD-10-CM | POA: Diagnosis not present

## 2018-07-20 DIAGNOSIS — E119 Type 2 diabetes mellitus without complications: Secondary | ICD-10-CM | POA: Diagnosis not present

## 2018-07-20 DIAGNOSIS — C159 Malignant neoplasm of esophagus, unspecified: Secondary | ICD-10-CM | POA: Diagnosis not present

## 2018-07-20 DIAGNOSIS — I10 Essential (primary) hypertension: Secondary | ICD-10-CM | POA: Diagnosis not present

## 2018-07-22 DIAGNOSIS — I509 Heart failure, unspecified: Secondary | ICD-10-CM | POA: Diagnosis not present

## 2018-07-22 DIAGNOSIS — F250 Schizoaffective disorder, bipolar type: Secondary | ICD-10-CM | POA: Diagnosis not present

## 2018-07-22 DIAGNOSIS — E119 Type 2 diabetes mellitus without complications: Secondary | ICD-10-CM | POA: Diagnosis not present

## 2018-07-22 DIAGNOSIS — I519 Heart disease, unspecified: Secondary | ICD-10-CM | POA: Diagnosis not present

## 2018-07-22 DIAGNOSIS — C159 Malignant neoplasm of esophagus, unspecified: Secondary | ICD-10-CM | POA: Diagnosis not present

## 2018-07-22 DIAGNOSIS — I10 Essential (primary) hypertension: Secondary | ICD-10-CM | POA: Diagnosis not present

## 2018-07-26 DIAGNOSIS — C159 Malignant neoplasm of esophagus, unspecified: Secondary | ICD-10-CM | POA: Diagnosis not present

## 2018-07-26 DIAGNOSIS — I10 Essential (primary) hypertension: Secondary | ICD-10-CM | POA: Diagnosis not present

## 2018-07-26 DIAGNOSIS — F250 Schizoaffective disorder, bipolar type: Secondary | ICD-10-CM | POA: Diagnosis not present

## 2018-07-26 DIAGNOSIS — I509 Heart failure, unspecified: Secondary | ICD-10-CM | POA: Diagnosis not present

## 2018-07-26 DIAGNOSIS — I519 Heart disease, unspecified: Secondary | ICD-10-CM | POA: Diagnosis not present

## 2018-07-26 DIAGNOSIS — E119 Type 2 diabetes mellitus without complications: Secondary | ICD-10-CM | POA: Diagnosis not present

## 2018-07-28 DIAGNOSIS — I509 Heart failure, unspecified: Secondary | ICD-10-CM | POA: Diagnosis not present

## 2018-07-28 DIAGNOSIS — R404 Transient alteration of awareness: Secondary | ICD-10-CM | POA: Diagnosis not present

## 2018-07-28 DIAGNOSIS — C159 Malignant neoplasm of esophagus, unspecified: Secondary | ICD-10-CM | POA: Diagnosis not present

## 2018-07-28 DIAGNOSIS — Z209 Contact with and (suspected) exposure to unspecified communicable disease: Secondary | ICD-10-CM | POA: Diagnosis not present

## 2018-07-28 DIAGNOSIS — Z743 Need for continuous supervision: Secondary | ICD-10-CM | POA: Diagnosis not present

## 2018-07-28 DIAGNOSIS — I10 Essential (primary) hypertension: Secondary | ICD-10-CM | POA: Diagnosis not present

## 2018-07-28 DIAGNOSIS — E119 Type 2 diabetes mellitus without complications: Secondary | ICD-10-CM | POA: Diagnosis not present

## 2018-07-28 DIAGNOSIS — I519 Heart disease, unspecified: Secondary | ICD-10-CM | POA: Diagnosis not present

## 2018-07-28 DIAGNOSIS — F250 Schizoaffective disorder, bipolar type: Secondary | ICD-10-CM | POA: Diagnosis not present

## 2018-07-29 DIAGNOSIS — I509 Heart failure, unspecified: Secondary | ICD-10-CM | POA: Diagnosis not present

## 2018-07-29 DIAGNOSIS — I519 Heart disease, unspecified: Secondary | ICD-10-CM | POA: Diagnosis not present

## 2018-07-29 DIAGNOSIS — E119 Type 2 diabetes mellitus without complications: Secondary | ICD-10-CM | POA: Diagnosis not present

## 2018-07-29 DIAGNOSIS — I10 Essential (primary) hypertension: Secondary | ICD-10-CM | POA: Diagnosis not present

## 2018-07-29 DIAGNOSIS — F250 Schizoaffective disorder, bipolar type: Secondary | ICD-10-CM | POA: Diagnosis not present

## 2018-07-29 DIAGNOSIS — C159 Malignant neoplasm of esophagus, unspecified: Secondary | ICD-10-CM | POA: Diagnosis not present

## 2018-08-17 DEATH — deceased

## 2018-09-14 ENCOUNTER — Other Ambulatory Visit: Payer: Self-pay

## 2019-06-27 IMAGING — CR DG CHEST 1V PORT
1 series · 1 of 1 positions shown · non-contrast
Comparison: 08/16/2016.

CLINICAL DATA: Port-A-Cath insertion.

EXAM:
PORTABLE CHEST 1 VIEW

[portable]
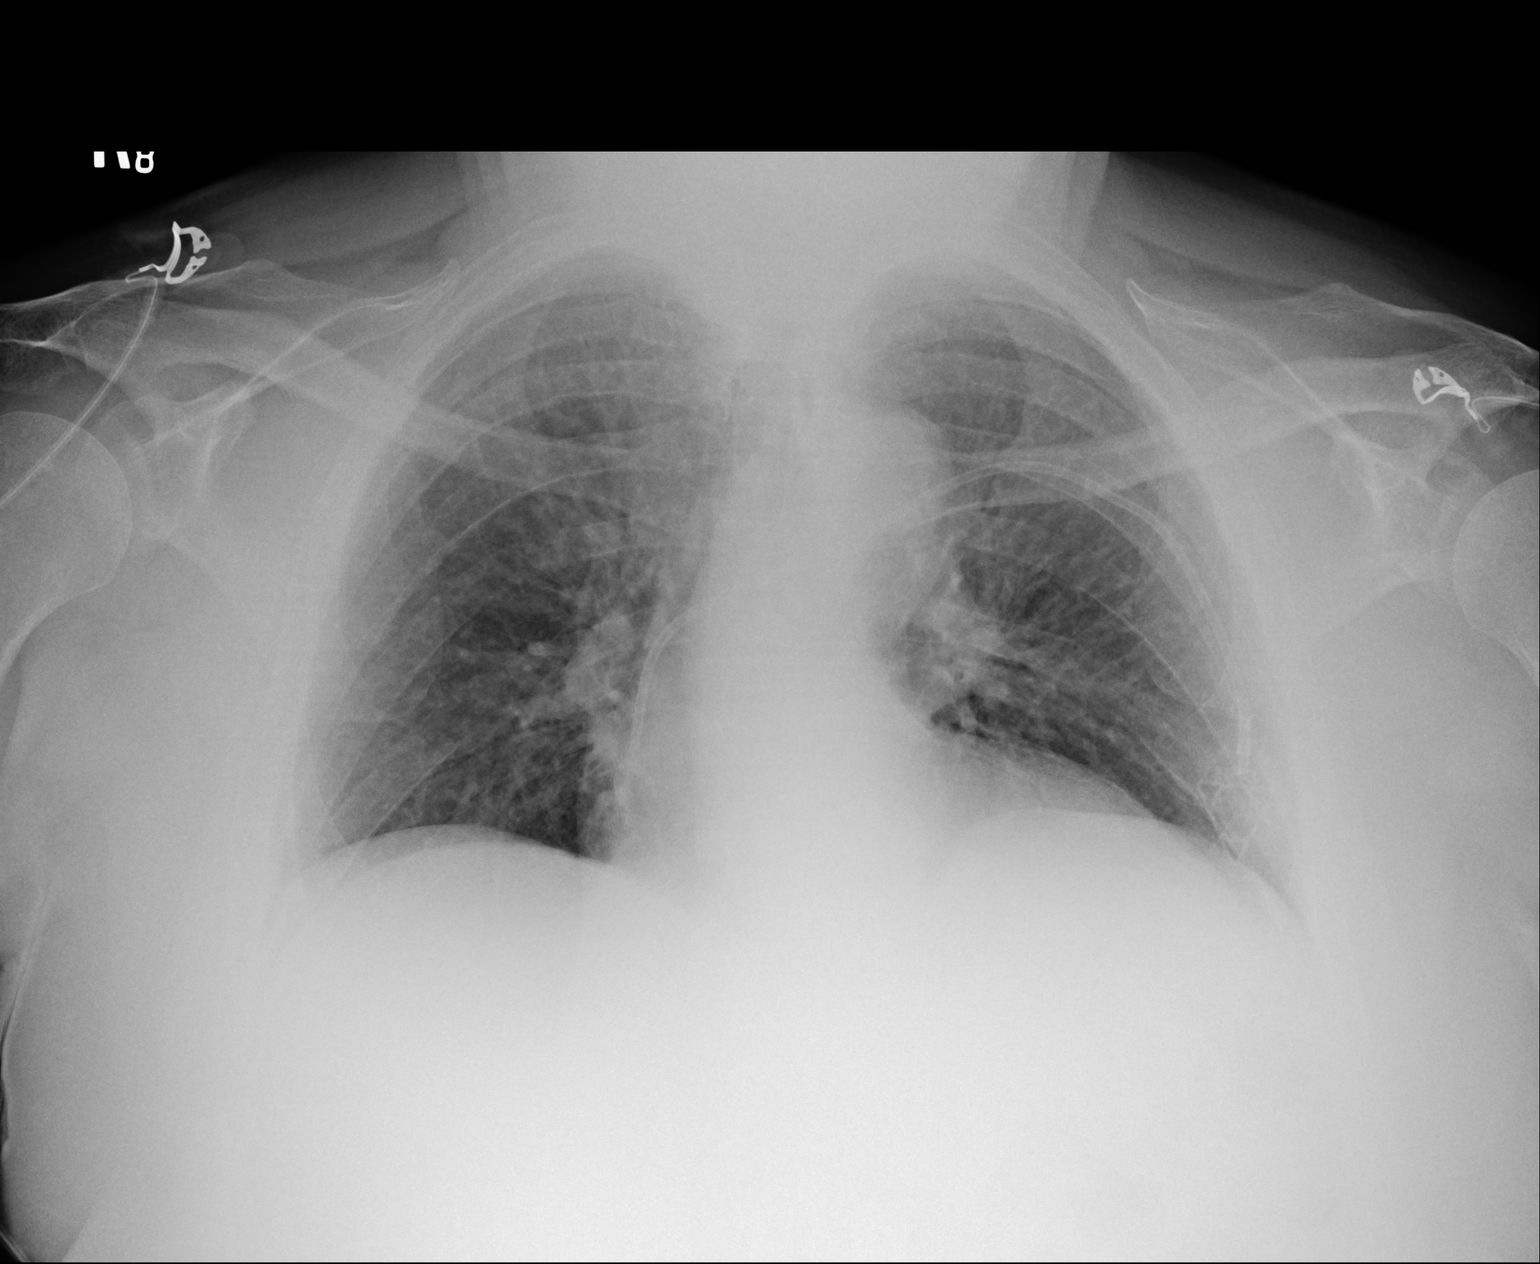

[1 of 1 positions shown; findings below may reference images not displayed]

FINDINGS: Left subclavian Port-A-Cath tip projects over the low SVC or SVC RA
junction. Heart is enlarged. Lungs are low in volume. No definite
pleural fluid. No definite pneumothorax.
IMPRESSION: Port-A-Cath placement without complicating feature.

## 2020-02-22 IMAGING — CT CT HEAD WO/W CM
3 of 4 series · 14 of 47 positions shown, 16 images · IV contrast (Isovue)
Comparison: PET-CT 03/24/2017.  Noncontrast head CT 03/05/2016.

CLINICAL DATA: 66-year-old male with esophageal cancer.

EXAM:
CT HEAD WITHOUT AND WITH CONTRAST
TECHNIQUE: Contiguous axial images were obtained from the base of the skull
through the vertex without and with intravenous contrast
CONTRAST:  75mL OMNIPAQUE IOHEXOL 300 MG/ML  SOLN

[Series 2: head wo · axial · 0.43mm/px · z∈[+16,+136]mm · 8 of 30 slices shown, 10 images]
[im 3/30  brain]
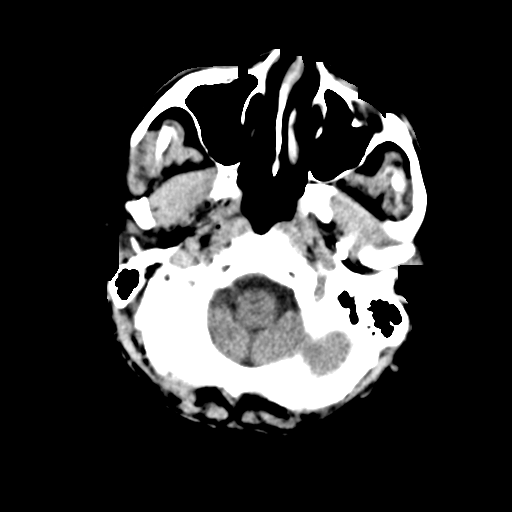
[im 3/30  bone]
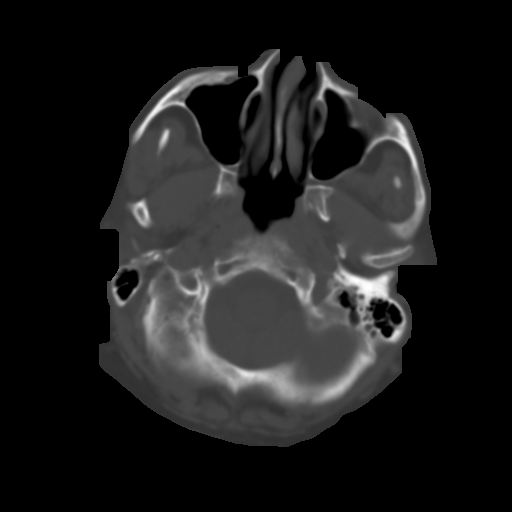
[im 7/30  brain]
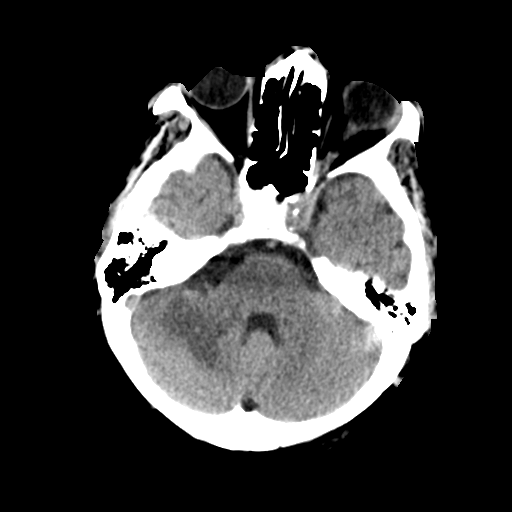
[im 11/30  brain]
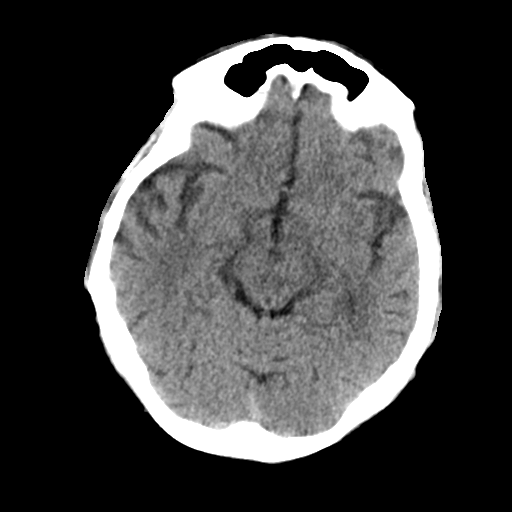
[im 13/30  brain]
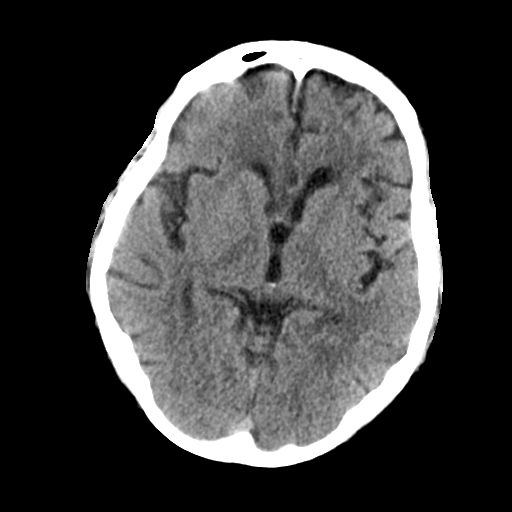
[im 17/30  brain]
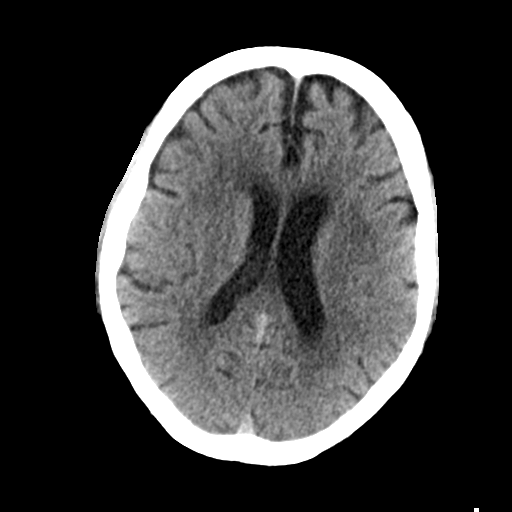
[im 17/30  bone]
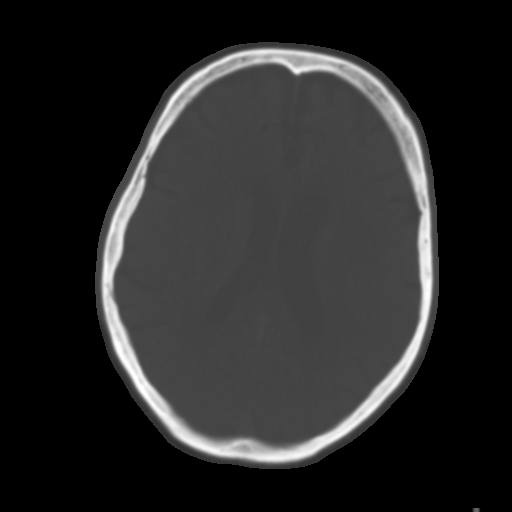
[im 19/30  brain]
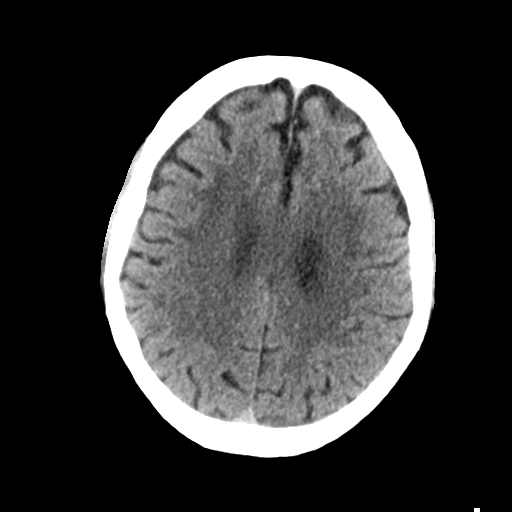
[im 23/30  brain]
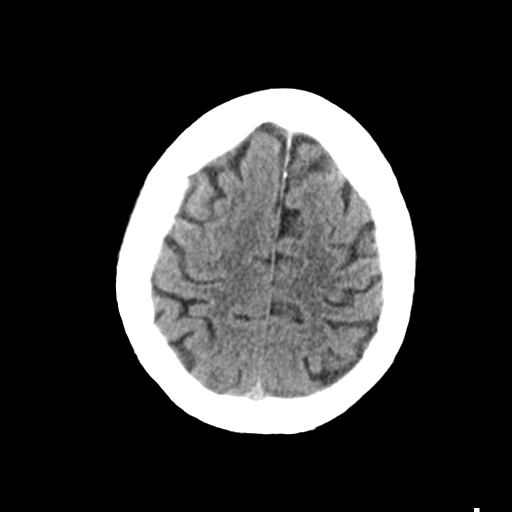
[im 27/30  brain]
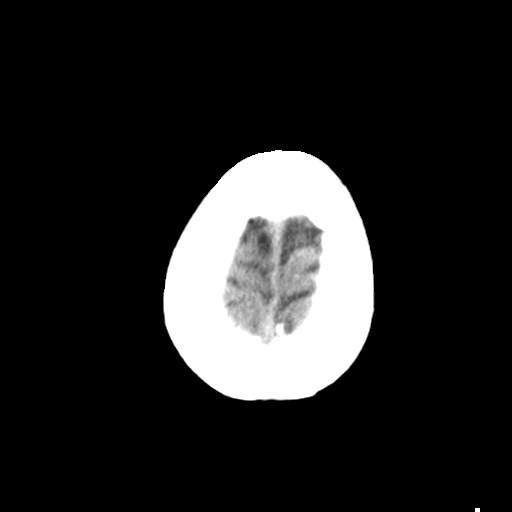

[Series 4: coronal soft tissue · coronal · 0.31mm/px · 3 of 72 slices shown]
[im 24/72  brain]
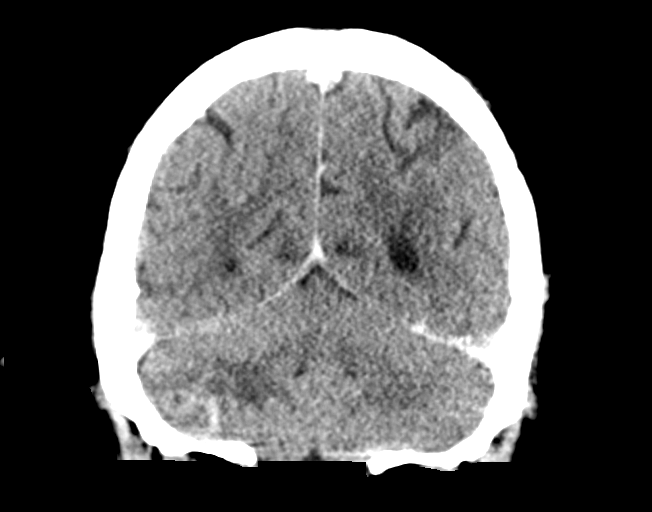
[im 32/72  brain]
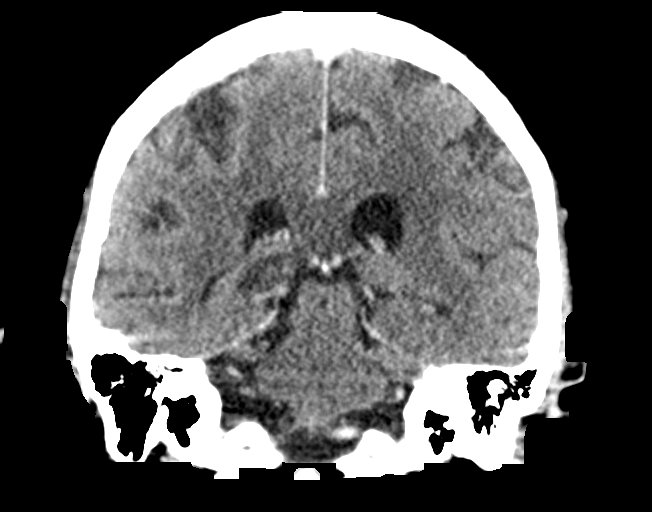
[im 40/72  brain]
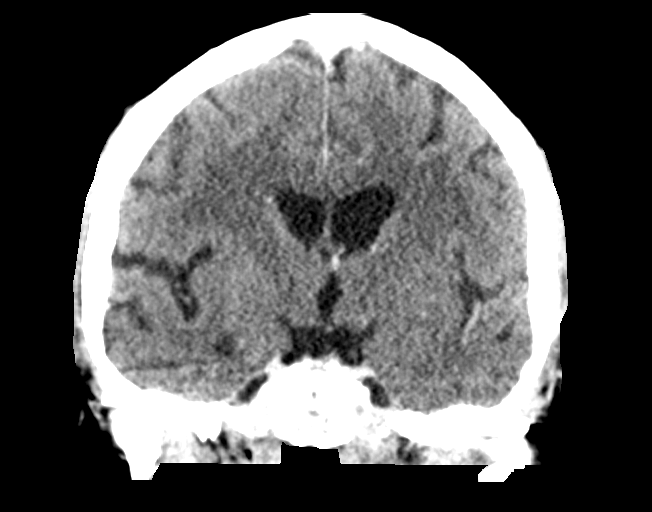

[Series 5: sagittal soft tissue · sagittal · 0.29mm/px · 3 of 61 slices shown]
[im 21/61  brain]
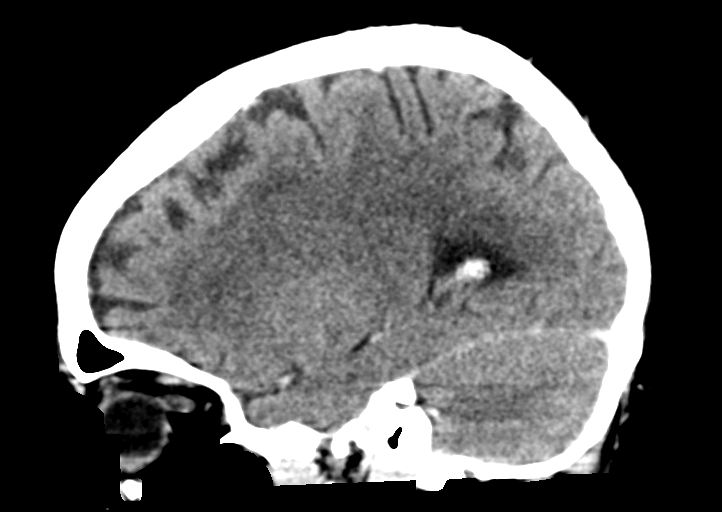
[im 31/61  brain]
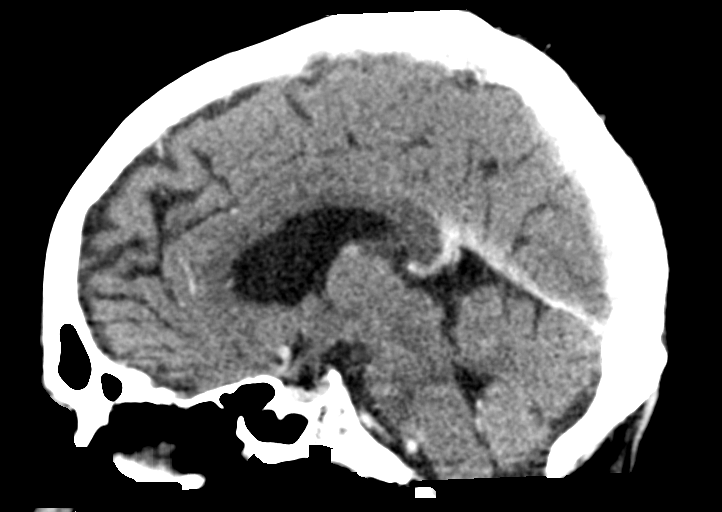
[im 41/61  brain]
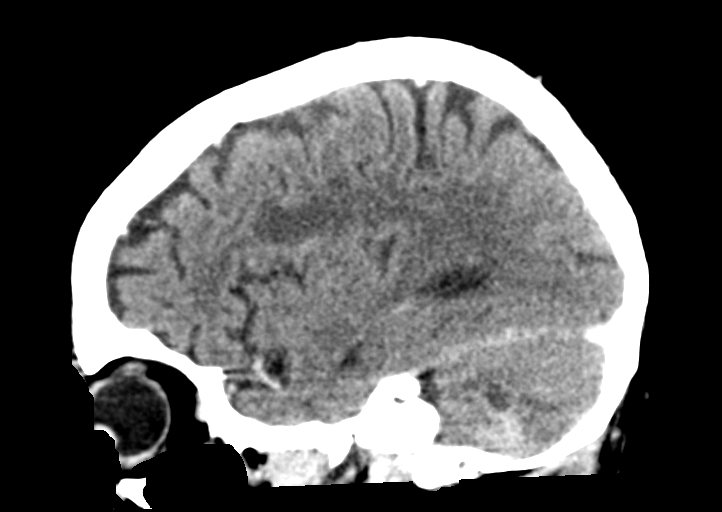

[14 of 47 positions shown; findings below may reference images not displayed]

FINDINGS: Brain: Heterogeneously enhancing mass in the right lower cerebellum
estimated at 24-26 millimeters diameter (series 3, image 6) is
associated with mild regional cerebellar edema (series 2, image 6).
There is no significant mass effect.

No other intracranial mass or abnormal enhancement. Patchy bilateral
cerebral white matter hypodensity appears progressed since 5168. A
possible small area of left inferior frontal gyrus encephalomalacia
on series 2, image 13 is stable. No midline shift or
ventriculomegaly. No intracranial hemorrhage or evidence of
cortically based acute infarction.

Vascular: Calcified atherosclerosis at the skull base. The major
intracranial vascular structures are enhancing and appear patent.

Skull: Stable and negative.

Sinuses/Orbits: Bubbly opacity in the right sphenoid sinus but
otherwise stable and well pneumatized. Tympanic cavities and
mastoids remain clear.

Other: Visualized orbit soft tissues are within normal limits.
Visualized scalp soft tissues are within normal limits.
IMPRESSION: 1. Positive for a solitary enhancing mass in the right cerebellum
compatible with Metastasis. Size estimated at 24-26 mm. Surrounding
edema but no significant mass effect.
2. No other metastatic disease identified, but head MRI (without and
with contrast) would be most sensitive and is recommended.

These results will be called to the ordering clinician or
representative by the Radiologist Assistant, and communication
documented in the PACS or zVision Dashboard.
